# Patient Record
Sex: Male | Born: 1940 | Race: White | Hispanic: No | Marital: Married | Smoking: Never smoker
Health system: Southern US, Community
[De-identification: ages and names within clinical notes are randomized; demographics above are authoritative.]

## PROBLEM LIST (undated history)

## (undated) DIAGNOSIS — K2970 Gastritis, unspecified, without bleeding: Secondary | ICD-10-CM

## (undated) DIAGNOSIS — Z8719 Personal history of other diseases of the digestive system: Secondary | ICD-10-CM

## (undated) DIAGNOSIS — K219 Gastro-esophageal reflux disease without esophagitis: Secondary | ICD-10-CM

## (undated) DIAGNOSIS — C801 Malignant (primary) neoplasm, unspecified: Secondary | ICD-10-CM

## (undated) DIAGNOSIS — I10 Essential (primary) hypertension: Secondary | ICD-10-CM

## (undated) DIAGNOSIS — E785 Hyperlipidemia, unspecified: Secondary | ICD-10-CM

## (undated) DIAGNOSIS — Z972 Presence of dental prosthetic device (complete) (partial): Secondary | ICD-10-CM

## (undated) DIAGNOSIS — M67919 Unspecified disorder of synovium and tendon, unspecified shoulder: Secondary | ICD-10-CM

## (undated) DIAGNOSIS — J302 Other seasonal allergic rhinitis: Secondary | ICD-10-CM

## (undated) DIAGNOSIS — E039 Hypothyroidism, unspecified: Secondary | ICD-10-CM

## (undated) DIAGNOSIS — M719 Bursopathy, unspecified: Secondary | ICD-10-CM

## (undated) DIAGNOSIS — F419 Anxiety disorder, unspecified: Secondary | ICD-10-CM

## (undated) DIAGNOSIS — H8109 Meniere's disease, unspecified ear: Secondary | ICD-10-CM

## (undated) DIAGNOSIS — M542 Cervicalgia: Secondary | ICD-10-CM

## (undated) DIAGNOSIS — C4490 Unspecified malignant neoplasm of skin, unspecified: Secondary | ICD-10-CM

## (undated) DIAGNOSIS — R972 Elevated prostate specific antigen [PSA]: Secondary | ICD-10-CM

## (undated) DIAGNOSIS — K824 Cholesterolosis of gallbladder: Secondary | ICD-10-CM

## (undated) DIAGNOSIS — R0789 Other chest pain: Secondary | ICD-10-CM

## (undated) HISTORY — PX: CHOLECYSTECTOMY: SHX55

## (undated) HISTORY — PX: APPENDECTOMY: SHX54

## (undated) HISTORY — PX: HYDROCELE EXCISION / REPAIR: SUR1145

## (undated) HISTORY — PX: COLONOSCOPY WITH ESOPHAGOGASTRODUODENOSCOPY (EGD): SHX5779

## (undated) HISTORY — PX: OTHER SURGICAL HISTORY: SHX169

## (undated) HISTORY — PX: COLONOSCOPY: SHX174

---

## 1999-07-23 HISTORY — PX: ROTATOR CUFF REPAIR: SHX139

## 2006-01-03 ENCOUNTER — Ambulatory Visit: Payer: Self-pay | Admitting: Internal Medicine

## 2006-03-08 ENCOUNTER — Ambulatory Visit: Payer: Self-pay | Admitting: Internal Medicine

## 2006-09-09 ENCOUNTER — Ambulatory Visit: Payer: Self-pay | Admitting: Unknown Physician Specialty

## 2006-09-19 ENCOUNTER — Ambulatory Visit: Payer: Self-pay | Admitting: Otolaryngology

## 2011-08-20 ENCOUNTER — Ambulatory Visit: Payer: Self-pay | Admitting: Unknown Physician Specialty

## 2011-08-21 LAB — PATHOLOGY REPORT

## 2013-09-24 ENCOUNTER — Ambulatory Visit: Payer: Self-pay | Admitting: Internal Medicine

## 2015-06-30 ENCOUNTER — Other Ambulatory Visit: Payer: Self-pay | Admitting: Internal Medicine

## 2015-06-30 DIAGNOSIS — R102 Pelvic and perineal pain: Secondary | ICD-10-CM

## 2015-06-30 DIAGNOSIS — R634 Abnormal weight loss: Secondary | ICD-10-CM

## 2015-06-30 DIAGNOSIS — R1084 Generalized abdominal pain: Secondary | ICD-10-CM

## 2015-07-12 ENCOUNTER — Ambulatory Visit
Admission: RE | Admit: 2015-07-12 | Discharge: 2015-07-12 | Disposition: A | Discharge status: Home / Self Care | Payer: Medicare Other | Source: Ambulatory Visit | Attending: Internal Medicine | Admitting: Internal Medicine

## 2015-07-12 DIAGNOSIS — N400 Enlarged prostate without lower urinary tract symptoms: Secondary | ICD-10-CM | POA: Insufficient documentation

## 2015-07-12 DIAGNOSIS — Z9049 Acquired absence of other specified parts of digestive tract: Secondary | ICD-10-CM | POA: Diagnosis not present

## 2015-07-12 DIAGNOSIS — R634 Abnormal weight loss: Secondary | ICD-10-CM | POA: Diagnosis present

## 2015-07-12 DIAGNOSIS — K5730 Diverticulosis of large intestine without perforation or abscess without bleeding: Secondary | ICD-10-CM | POA: Insufficient documentation

## 2015-07-12 DIAGNOSIS — R102 Pelvic and perineal pain: Secondary | ICD-10-CM

## 2015-07-12 DIAGNOSIS — R1084 Generalized abdominal pain: Secondary | ICD-10-CM | POA: Diagnosis not present

## 2015-07-12 MED ORDER — IOHEXOL 300 MG/ML  SOLN
100.0000 mL | Freq: Once | INTRAMUSCULAR | Status: AC | PRN
  Administered 2015-07-12: 100 mL via INTRAVENOUS

## 2016-01-11 NOTE — Discharge Instructions (Signed)

## 2016-01-15 ENCOUNTER — Ambulatory Visit: Payer: Medicare Other | Admitting: Anesthesiology

## 2016-01-15 ENCOUNTER — Ambulatory Visit
Admission: RE | Admit: 2016-01-15 | Discharge: 2016-01-15 | Disposition: A | Discharge status: Home / Self Care | Payer: Medicare Other | Source: Ambulatory Visit | Attending: Ophthalmology | Admitting: Ophthalmology

## 2016-01-15 ENCOUNTER — Encounter: Payer: Self-pay | Admitting: *Deleted

## 2016-01-15 ENCOUNTER — Encounter
Admission: RE | Disposition: A | Discharge status: Home / Self Care | Payer: Self-pay | Source: Ambulatory Visit | Attending: Ophthalmology

## 2016-01-15 DIAGNOSIS — H9190 Unspecified hearing loss, unspecified ear: Secondary | ICD-10-CM | POA: Insufficient documentation

## 2016-01-15 DIAGNOSIS — Z79899 Other long term (current) drug therapy: Secondary | ICD-10-CM | POA: Insufficient documentation

## 2016-01-15 DIAGNOSIS — Z791 Long term (current) use of non-steroidal anti-inflammatories (NSAID): Secondary | ICD-10-CM | POA: Diagnosis not present

## 2016-01-15 DIAGNOSIS — K219 Gastro-esophageal reflux disease without esophagitis: Secondary | ICD-10-CM | POA: Insufficient documentation

## 2016-01-15 DIAGNOSIS — H8109 Meniere's disease, unspecified ear: Secondary | ICD-10-CM | POA: Insufficient documentation

## 2016-01-15 DIAGNOSIS — H2511 Age-related nuclear cataract, right eye: Secondary | ICD-10-CM | POA: Insufficient documentation

## 2016-01-15 DIAGNOSIS — E039 Hypothyroidism, unspecified: Secondary | ICD-10-CM | POA: Diagnosis not present

## 2016-01-15 HISTORY — PX: CATARACT EXTRACTION W/PHACO: SHX586

## 2016-01-15 HISTORY — DX: Presence of dental prosthetic device (complete) (partial): Z97.2

## 2016-01-15 HISTORY — DX: Other seasonal allergic rhinitis: J30.2

## 2016-01-15 HISTORY — DX: Hypothyroidism, unspecified: E03.9

## 2016-01-15 HISTORY — DX: Meniere's disease, unspecified ear: H81.09

## 2016-01-15 SURGERY — PHACOEMULSIFICATION, CATARACT, WITH IOL INSERTION
Anesthesia: Monitor Anesthesia Care | Laterality: Right | Wound class: Clean

## 2016-01-15 MED ORDER — POVIDONE-IODINE 5 % OP SOLN
1.0000 "application " | OPHTHALMIC | Status: DC | PRN
  Administered 2016-01-15: 1 via OPHTHALMIC

## 2016-01-15 MED ORDER — FENTANYL CITRATE (PF) 100 MCG/2ML IJ SOLN
INTRAMUSCULAR | Status: DC | PRN
  Administered 2016-01-15 (×2): 50 ug via INTRAVENOUS

## 2016-01-15 MED ORDER — TIMOLOL MALEATE 0.5 % OP SOLN
OPHTHALMIC | Status: DC | PRN
  Administered 2016-01-15: 1 [drp] via OPHTHALMIC

## 2016-01-15 MED ORDER — TETRACAINE HCL 0.5 % OP SOLN
1.0000 [drp] | OPHTHALMIC | Status: DC | PRN
  Administered 2016-01-15: 1 [drp] via OPHTHALMIC

## 2016-01-15 MED ORDER — EPINEPHRINE HCL 1 MG/ML IJ SOLN
INTRAOCULAR | Status: DC | PRN
  Administered 2016-01-15: 1 mL via OPHTHALMIC

## 2016-01-15 MED ORDER — NA HYALUR & NA CHOND-NA HYALUR 0.4-0.35 ML IO KIT
PACK | INTRAOCULAR | Status: DC | PRN
  Administered 2016-01-15: 1 mL via INTRAOCULAR

## 2016-01-15 MED ORDER — ARMC OPHTHALMIC DILATING GEL
1.0000 "application " | OPHTHALMIC | Status: DC | PRN
  Administered 2016-01-15 (×2): 1 via OPHTHALMIC

## 2016-01-15 MED ORDER — BRIMONIDINE TARTRATE 0.2 % OP SOLN
OPHTHALMIC | Status: DC | PRN
  Administered 2016-01-15: 1 [drp] via OPHTHALMIC

## 2016-01-15 MED ORDER — LACTATED RINGERS IV SOLN: INTRAVENOUS | Status: DC

## 2016-01-15 MED ORDER — OXYCODONE HCL 5 MG/5ML PO SOLN: 5.0000 mg | Freq: Once | ORAL | Status: DC | PRN

## 2016-01-15 MED ORDER — CEFUROXIME OPHTHALMIC INJECTION 1 MG/0.1 ML
INJECTION | OPHTHALMIC | Status: DC | PRN
  Administered 2016-01-15: 0.1 mL via INTRACAMERAL

## 2016-01-15 MED ORDER — OXYCODONE HCL 5 MG PO TABS: 5.0000 mg | ORAL_TABLET | Freq: Once | ORAL | Status: DC | PRN

## 2016-01-15 MED ORDER — LIDOCAINE HCL (PF) 4 % IJ SOLN
INTRAOCULAR | Status: DC | PRN
  Administered 2016-01-15: 1 mL via OPHTHALMIC

## 2016-01-15 MED ORDER — MIDAZOLAM HCL 2 MG/2ML IJ SOLN
INTRAMUSCULAR | Status: DC | PRN
  Administered 2016-01-15: 2 mg via INTRAVENOUS

## 2016-01-15 SURGICAL SUPPLY — 28 items

## 2016-01-15 NOTE — Op Note (Signed)
Date of Surgery: 01/15/2016  PREOPERATIVE DIAGNOSES: Visually significant nuclear sclerotic cataract, right eye.  POSTOPERATIVE DIAGNOSES: Same  PROCEDURES PERFORMED: Cataract extraction with intraocular lens implant, right eye.  SURGEON: Almon Hercules, M.D.  ANESTHESIA: MAC and topical  IMPLANTS: [id number] +19.5 D   Implant Name Type Inv. Item Serial No. Manufacturer Lot No. LRB No. Used  LENS IOL ACRYSOF IQ 19.5 - [id number] Intraocular Lens LENS IOL ACRYSOF IQ 19.5 [id number] ALCON   Right 1     COMPLICATIONS: None.  DESCRIPTION OF PROCEDURE: Therapeutic options were discussed with the patient preoperatively, including a discussion of risks and benefits of surgery. Informed consent was obtained. An IOL-Master and immersion biometry were used to take the lens measurements, and a dilated fundus exam was performed within 6 months of the surgical date.  The patient was premedicated and brought to the operating room and placed on the operating table in the supine position. After adequate anesthesia, the patient was prepped and draped in the usual sterile ophthalmic fashion. A wire lid speculum was inserted and the microscope was positioned. A Superblade was used to create a paracentesis site at the limbus and a small amount of dilute preservative free lidocaine was instilled into the anterior chamber, followed by dispersive viscoelastic. A clear corneal incision was created temporally using a 2.4 mm keratome blade. Capsulorrhexis was then performed. In situ phacoemulsification was performed.  Cortical material was removed with the irrigation-aspiration unit. Dispersive viscoelastic was instilled to open the capsular bag. A posterior chamber intraocular lens with the specifications above was inserted and positioned. Irrigation-aspiration was used to remove all viscoelastic. Cefuroxime 1cc was instilled into the anterior chamber, and the corneal incision was checked and found to be water tight.  The eyelid speculum was removed.  The operative eye was covered with protective goggles after instilling 1 drop of timolol and brimonidine. The patient tolerated the procedure well. There were no complications.

## 2016-01-15 NOTE — Transfer of Care (Signed)
Immediate Anesthesia Transfer of Care Note  Patient: Melvin Gilmore  Procedure(s) Performed: Procedure(s) with comments: CATARACT EXTRACTION PHACO AND INTRAOCULAR LENS PLACEMENT ([place]) (Right) - RIGHT CALL CELL PHONE WITH TIME  Patient Location: PACU  Anesthesia Type: MAC  Level of Consciousness: awake, alert  and patient cooperative  Airway and Oxygen Therapy: Patient Spontanous Breathing and Patient connected to supplemental oxygen  Post-op Assessment: Post-op Vital signs reviewed, Patient's Cardiovascular Status Stable, Respiratory Function Stable, Patent Airway and No signs of Nausea or vomiting  Post-op Vital Signs: Reviewed and stable  Complications: No apparent anesthesia complications

## 2016-01-15 NOTE — H&P (Signed)
H+P reviewed and is up to date, please see paper chart.  

## 2016-01-15 NOTE — Anesthesia Postprocedure Evaluation (Signed)
Anesthesia Post Note  Patient: Melvin Gilmore  Procedure(s) Performed: Procedure(s) (LRB): CATARACT EXTRACTION PHACO AND INTRAOCULAR LENS PLACEMENT (IOC) (Right)  Patient location during evaluation: PACU Anesthesia Type: MAC Level of consciousness: awake and alert Pain management: pain level controlled Vital Signs Assessment: post-procedure vital signs reviewed and stable Respiratory status: spontaneous breathing, nonlabored ventilation, respiratory function stable and patient connected to nasal cannula oxygen Cardiovascular status: stable and blood pressure returned to baseline Anesthetic complications: no    Jawaan Adachi

## 2016-01-15 NOTE — Anesthesia Preprocedure Evaluation (Signed)
Anesthesia Evaluation  Patient identified by MRN, date of birth, ID band  Reviewed: NPO status   History of Anesthesia Complications Negative for: history of anesthetic complications  Airway Mallampati: II  TM Distance: >3 FB Neck ROM: full    Dental  (+) Lower Dentures   Pulmonary neg pulmonary ROS,    Pulmonary exam normal        Cardiovascular Exercise Tolerance: Good negative cardio ROS Normal cardiovascular exam     Neuro/Psych Meniere disease negative psych ROS   GI/Hepatic Neg liver ROS, GERD  Controlled,  Endo/Other  Hypothyroidism   Renal/GU negative Renal ROS  negative genitourinary   Musculoskeletal   Abdominal   Peds  Hematology negative hematology ROS (+)   Anesthesia Other Findings   Reproductive/Obstetrics                             Anesthesia Physical Anesthesia Plan  ASA: II  Anesthesia Plan: MAC   Post-op Pain Management:    Induction:   Airway Management Planned:   Additional Equipment:   Intra-op Plan:   Post-operative Plan:   Informed Consent: I have reviewed the patients History and Physical, chart, labs and discussed the procedure including the risks, benefits and alternatives for the proposed anesthesia with the patient or authorized representative who has indicated his/her understanding and acceptance.     Plan Discussed with: CRNA  Anesthesia Plan Comments:         Anesthesia Quick Evaluation

## 2016-01-16 ENCOUNTER — Encounter: Payer: Self-pay | Admitting: Ophthalmology

## 2016-12-30 ENCOUNTER — Other Ambulatory Visit: Payer: Self-pay | Admitting: Internal Medicine

## 2016-12-30 DIAGNOSIS — R053 Chronic cough: Secondary | ICD-10-CM

## 2016-12-30 DIAGNOSIS — R05 Cough: Secondary | ICD-10-CM

## 2017-01-13 ENCOUNTER — Ambulatory Visit
Admission: RE | Admit: 2017-01-13 | Discharge: 2017-01-13 | Disposition: A | Discharge status: Home / Self Care | Payer: Medicare Other | Source: Ambulatory Visit | Attending: Internal Medicine | Admitting: Internal Medicine

## 2017-01-13 DIAGNOSIS — R05 Cough: Secondary | ICD-10-CM | POA: Diagnosis present

## 2017-01-13 DIAGNOSIS — R918 Other nonspecific abnormal finding of lung field: Secondary | ICD-10-CM | POA: Diagnosis not present

## 2017-01-13 DIAGNOSIS — I700 Atherosclerosis of aorta: Secondary | ICD-10-CM | POA: Insufficient documentation

## 2017-01-13 DIAGNOSIS — K7689 Other specified diseases of liver: Secondary | ICD-10-CM | POA: Insufficient documentation

## 2017-01-13 DIAGNOSIS — N281 Cyst of kidney, acquired: Secondary | ICD-10-CM | POA: Insufficient documentation

## 2017-01-13 DIAGNOSIS — R053 Chronic cough: Secondary | ICD-10-CM

## 2017-08-19 ENCOUNTER — Other Ambulatory Visit: Payer: Self-pay | Admitting: Internal Medicine

## 2017-08-19 DIAGNOSIS — K224 Dyskinesia of esophagus: Secondary | ICD-10-CM

## 2017-08-28 ENCOUNTER — Ambulatory Visit
Admission: RE | Admit: 2017-08-28 | Discharge: 2017-08-28 | Disposition: A | Discharge status: Home / Self Care | Payer: Medicare Other | Source: Ambulatory Visit | Attending: Internal Medicine | Admitting: Internal Medicine

## 2017-08-28 DIAGNOSIS — R1310 Dysphagia, unspecified: Secondary | ICD-10-CM | POA: Diagnosis present

## 2017-08-28 DIAGNOSIS — K224 Dyskinesia of esophagus: Secondary | ICD-10-CM

## 2017-08-28 DIAGNOSIS — K219 Gastro-esophageal reflux disease without esophagitis: Secondary | ICD-10-CM | POA: Diagnosis not present

## 2017-08-28 DIAGNOSIS — K449 Diaphragmatic hernia without obstruction or gangrene: Secondary | ICD-10-CM | POA: Insufficient documentation

## 2017-09-22 ENCOUNTER — Encounter: Payer: Self-pay | Admitting: *Deleted

## 2017-09-23 ENCOUNTER — Encounter: Payer: Self-pay | Admitting: *Deleted

## 2017-09-23 ENCOUNTER — Ambulatory Visit: Payer: Medicare Other | Admitting: Anesthesiology

## 2017-09-23 ENCOUNTER — Encounter
Admission: RE | Disposition: A | Discharge status: Home / Self Care | Payer: Self-pay | Source: Ambulatory Visit | Attending: Internal Medicine

## 2017-09-23 ENCOUNTER — Ambulatory Visit
Admission: RE | Admit: 2017-09-23 | Discharge: 2017-09-23 | Disposition: A | Discharge status: Home / Self Care | Payer: Medicare Other | Source: Ambulatory Visit | Attending: Internal Medicine | Admitting: Internal Medicine

## 2017-09-23 DIAGNOSIS — Z79890 Hormone replacement therapy (postmenopausal): Secondary | ICD-10-CM | POA: Insufficient documentation

## 2017-09-23 DIAGNOSIS — J302 Other seasonal allergic rhinitis: Secondary | ICD-10-CM | POA: Insufficient documentation

## 2017-09-23 DIAGNOSIS — K449 Diaphragmatic hernia without obstruction or gangrene: Secondary | ICD-10-CM | POA: Insufficient documentation

## 2017-09-23 DIAGNOSIS — E039 Hypothyroidism, unspecified: Secondary | ICD-10-CM | POA: Diagnosis not present

## 2017-09-23 DIAGNOSIS — R933 Abnormal findings on diagnostic imaging of other parts of digestive tract: Secondary | ICD-10-CM | POA: Insufficient documentation

## 2017-09-23 DIAGNOSIS — K210 Gastro-esophageal reflux disease with esophagitis: Secondary | ICD-10-CM | POA: Diagnosis not present

## 2017-09-23 DIAGNOSIS — Z7951 Long term (current) use of inhaled steroids: Secondary | ICD-10-CM | POA: Insufficient documentation

## 2017-09-23 DIAGNOSIS — Z79899 Other long term (current) drug therapy: Secondary | ICD-10-CM | POA: Diagnosis not present

## 2017-09-23 DIAGNOSIS — R1310 Dysphagia, unspecified: Secondary | ICD-10-CM | POA: Diagnosis not present

## 2017-09-23 DIAGNOSIS — F419 Anxiety disorder, unspecified: Secondary | ICD-10-CM | POA: Diagnosis not present

## 2017-09-23 DIAGNOSIS — K222 Esophageal obstruction: Secondary | ICD-10-CM | POA: Insufficient documentation

## 2017-09-23 HISTORY — DX: Gastritis, unspecified, without bleeding: K29.70

## 2017-09-23 HISTORY — DX: Unspecified disorder of synovium and tendon, unspecified shoulder: M67.919

## 2017-09-23 HISTORY — DX: Bursopathy, unspecified: M71.9

## 2017-09-23 HISTORY — DX: Meniere's disease, unspecified ear: H81.09

## 2017-09-23 HISTORY — DX: Hyperlipidemia, unspecified: E78.5

## 2017-09-23 HISTORY — DX: Gastro-esophageal reflux disease without esophagitis: K21.9

## 2017-09-23 HISTORY — DX: Anxiety disorder, unspecified: F41.9

## 2017-09-23 HISTORY — PX: ESOPHAGOGASTRODUODENOSCOPY (EGD) WITH PROPOFOL: SHX5813

## 2017-09-23 SURGERY — ESOPHAGOGASTRODUODENOSCOPY (EGD) WITH PROPOFOL
Anesthesia: General

## 2017-09-23 MED ORDER — SODIUM CHLORIDE 0.9 % IV SOLN
INTRAVENOUS | Status: DC
  Administered 2017-09-23: 1000 mL via INTRAVENOUS

## 2017-09-23 MED ORDER — LIDOCAINE HCL (CARDIAC) 20 MG/ML IV SOLN
INTRAVENOUS | Status: DC | PRN
  Administered 2017-09-23: 40 mg via INTRAVENOUS

## 2017-09-23 MED ORDER — PROPOFOL 10 MG/ML IV BOLUS
INTRAVENOUS | Status: DC | PRN
  Administered 2017-09-23: 60 mg via INTRAVENOUS
  Administered 2017-09-23: 20 mg via INTRAVENOUS

## 2017-09-23 MED ORDER — PROPOFOL 500 MG/50ML IV EMUL
INTRAVENOUS | Status: DC | PRN
  Administered 2017-09-23: 140 ug/kg/min via INTRAVENOUS

## 2017-09-23 NOTE — Anesthesia Preprocedure Evaluation (Signed)
Anesthesia Evaluation  Patient identified by MRN, date of birth, ID band Patient awake    Reviewed: Allergy & Precautions, NPO status , Patient's Chart, lab work & pertinent test results  History of Anesthesia Complications Negative for: history of anesthetic complications  Airway Mallampati: II  TM Distance: >3 FB Neck ROM: full    Dental  (+) Lower Dentures   Pulmonary neg pulmonary ROS,    Pulmonary exam normal        Cardiovascular Exercise Tolerance: Good negative cardio ROS Normal cardiovascular exam     Neuro/Psych Meniere disease  Neuromuscular disease negative psych ROS   GI/Hepatic Neg liver ROS, GERD  Controlled,  Endo/Other  Hypothyroidism   Renal/GU negative Renal ROS  negative genitourinary   Musculoskeletal   Abdominal   Peds  Hematology negative hematology ROS (+)   Anesthesia Other Findings Past Medical History: No date: Anxiety No date: 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 No date: Elevated lipids No date: Gastritis No date: GERD (gastroesophageal reflux disease) No date: Hypothyroidism No date: Meniere disease     Comment:  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 No date: Meniere's disease No date: Seasonal allergies No date: Wears partial dentures     Comment:  lower  Reproductive/Obstetrics                             Anesthesia Physical  Anesthesia Plan  ASA: II  Anesthesia Plan: General   Post-op Pain Management:    Induction:   PONV Risk Score and Plan:   Airway Management Planned: Nasal Cannula  Additional Equipment:   Intra-op Plan:   Post-operative Plan:   Informed Consent: I have reviewed the patients History and Physical, chart, labs and discussed the procedure including the risks, benefits and alternatives for the proposed anesthesia with the patient or authorized representative who has indicated his/her  understanding and acceptance.     Plan Discussed with: CRNA  Anesthesia Plan Comments:         Anesthesia Quick Evaluation

## 2017-09-23 NOTE — Anesthesia Post-op Follow-up Note (Signed)
Anesthesia QCDR form completed.        

## 2017-09-23 NOTE — H&P (Signed)
Outpatient short stay form Pre-procedure 09/23/2017 8:37 AM Melvin Gilmore Melvin Gilmore, M.D.  Primary Physician: Melvin Gilmore, M.D.  Reason for visit:  Dysphagia, Abnormal barium swallow  History of present illness: Patient is a 77 year old male with a history of recurrent dysphagia status post barium swallow on 08/28/2017 revealing upper esophageal and distal esophageal narrowing with the barium tablet passing through both narrowed areas without difficulty.  Lesion in the bottom of the esophagus called a "possible stricture" small hiatal hernia also noted.    Current Facility-Administered Medications:  .  0.9 %  sodium chloride infusion, , Intravenous, Continuous, [place], [name], MD, Last Rate: 20 mL/hr at 09/23/17 0758, 1,000 mL at 09/23/17 0758  Medications Prior to Admission  Medication Sig Dispense Refill Last Dose  . ALPRAZolam (XANAX) 0.5 MG tablet Take 0.5 mg by mouth at bedtime as needed for anxiety.     Marland Kitchen omeprazole (PRILOSEC) 40 MG capsule Take 40 mg by mouth daily.     . tadalafil (CIALIS) 5 MG tablet Take 5 mg by mouth daily as needed for erectile dysfunction.     . [DISCONTINUED] fluticasone (FLONASE) 50 MCG/ACT nasal spray Place 1 spray into both nostrils 2 (two) times daily.     . [DISCONTINUED] ranitidine (ZANTAC) 300 MG tablet Take 300 mg by mouth at bedtime.     . [DISCONTINUED] tamsulosin (FLOMAX) 0.4 MG CAPS capsule Take 0.4 mg by mouth.     . cetirizine (ZYRTEC) 10 MG tablet Take 10 mg by mouth as needed for allergies.   01/14/2016 at Unknown time  . ibuprofen (ADVIL) 200 MG tablet Take 200 mg by mouth every 6 (six) hours as needed.   Past Week at Unknown time  . levothyroxine (SYNTHROID, LEVOTHROID) 50 MCG tablet Take 50 mcg by mouth daily before breakfast.   01/15/2016 at 0500  . Plant Sterols and Stanols (CHOLESTOFF) 450 MG TABS Take 1 tablet by mouth.    Past Week at Unknown time  . Probiotic Product (PROBIOTIC ADVANCED PO) Take by mouth.   Past Week at Unknown time  .  [DISCONTINUED] esomeprazole (NEXIUM) 20 MG capsule Take 20 mg by mouth as needed.   01/13/2016     Allergies  Allergen Reactions  . Augmentin [Amoxicillin-Pot Clavulanate]   . Mobic [Meloxicam]      Past Medical History:  Diagnosis Date  . Anxiety   . 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 Elevated lipids   . Gastritis   . GERD (gastroesophageal reflux disease)   . Hypothyroidism   . 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 Meniere's disease   . Seasonal allergies   . Wears partial dentures    lower    Review of systems:   Negative.   Physical Exam  Gen: Alert, oriented. Appears stated age.  HEENT: [place]/AT. PERRLA. Lungs: CTA, no wheezes. CV: RR nl S1, S2. Abd: soft, benign, no masses. BS+ Ext: No edema. Pulses 2+    Planned procedures: EGD. The patient understands the nature of the planned procedure, indications, risks, alternatives and potential complications including but not limited to bleeding, infection, perforation, damage to internal organs and possible oversedation/side effects from anesthesia. The patient agrees and gives consent to proceed.  Please refer to procedure notes for findings, recommendations and patient disposition/instructions.    [name] Melvin Gilmore, M.D. Gastroenterology 09/23/2017  8:37 AM

## 2017-09-23 NOTE — Anesthesia Postprocedure Evaluation (Signed)
Anesthesia Post Note  Patient: Melvin Gilmore  Procedure(s) Performed: ESOPHAGOGASTRODUODENOSCOPY (EGD) WITH PROPOFOL (N/A )  Patient location during evaluation: PACU Anesthesia Type: General Level of consciousness: awake and alert and oriented Pain management: pain level controlled Vital Signs Assessment: post-procedure vital signs reviewed and stable Respiratory status: spontaneous breathing Cardiovascular status: blood pressure returned to baseline Anesthetic complications: no     Last Vitals:  Vitals:   09/23/17 0930 09/23/17 0940  BP: 119/83 137/79  Pulse: 64 (!) 50  Resp: (!) 26 16  Temp:    SpO2: 100% 100%    Last Pain:  Vitals:   09/23/17 0746  TempSrc: Tympanic                 Oree Mirelez

## 2017-09-23 NOTE — Op Note (Signed)
[facility] Gastroenterology Patient Name: Melvin Gilmore Procedure Date: 09/23/2017 8:43 AM MRN: [id number] Account #: [id number] Date of Birth: 06-Feb-1941 Admit Type: Outpatient Age: 77 Room: [facility] ENDO ROOM 2 Gender: Male Note Status: Finalized Procedure:            Upper GI endoscopy Indications:          Esophageal dysphagia, Abnormal UGI series Providers:            [name]. Alice Reichert MD, MD Referring MD:         Leonie Douglas. Doy Hutching, MD (Referring MD) Medicines:            Propofol per Anesthesia Complications:        No immediate complications. Procedure:            Pre-Anesthesia Assessment:                       - The risks and benefits of the procedure and the                        sedation options and risks were discussed with the                        patient. All questions were answered and informed                        consent was obtained.                       - Patient identification and proposed procedure were                        verified prior to the procedure by the nurse. The                        procedure was verified in the procedure room.                       - ASA Grade Assessment: II - A patient with mild                        systemic disease.                       - After reviewing the risks and benefits, the patient                        was deemed in satisfactory condition to undergo the                        procedure.                       After obtaining informed consent, the endoscope was                        passed under direct vision. Throughout the procedure,                        the patient's blood pressure, pulse, and oxygen  saturations were monitored continuously. The Endoscope                        was introduced through the mouth, and advanced to the                        third part of duodenum. The upper GI endoscopy was                        accomplished without difficulty. The  patient tolerated                        the procedure well. Findings:      Mucosal changes including ringed esophagus were found in the entire       esophagus. Biopsies were obtained from the proximal and distal esophagus       with cold forceps for histology of suspected eosinophilic esophagitis.      One mild benign-appearing, intrinsic stenosis was found at the       gastroesophageal junction. This measured less than one cm (in length)       and was traversed. The scope was withdrawn. Dilation was performed with       a Maloney dilator with no resistance at 65 Fr.      A small hiatal hernia was present.      The exam was otherwise without abnormality.      The examined duodenum was normal. Impression:           - Esophageal mucosal changes suggestive of eosinophilic                        esophagitis. Biopsied.                       - Benign-appearing esophageal stenosis. Dilated.                       - Small hiatal hernia.                       - The examination was otherwise normal.                       - Normal examined duodenum. Recommendation:       - Patient has a contact number available for                        emergencies. The signs and symptoms of potential                        delayed complications were discussed with the patient.                        Return to normal activities tomorrow. Written discharge                        instructions were provided to the patient.                       - Resume previous diet.                       -  Continue present medications.                       - Await pathology results.                       - Repeat upper endoscopy PRN for retreatment.                       - Return to GI office in 3 months. Procedure Code(s):    --- Professional ---                       [phone], Esophagogastroduodenoscopy, flexible, transoral;                        with biopsy, single or multiple                       43450, Dilation of  esophagus, by unguided sound or                        bougie, single or multiple passes Diagnosis Code(s):    --- Professional ---                       R93.3, Abnormal findings on diagnostic imaging of other                        parts of digestive tract                       R13.14, Dysphagia, pharyngoesophageal phase                       K44.9, Diaphragmatic hernia without obstruction or                        gangrene                       K22.2, Esophageal obstruction                       K20.9, Esophagitis, unspecified CPT copyright 2016 American Medical Association. All rights reserved. The codes documented in this report are preliminary and upon coder review may  be revised to meet current compliance requirements. Efrain Sella MD, MD 09/23/2017 9:07:24 AM This report has been signed electronically. Number of Addenda: 0 Note Initiated On: 09/23/2017 8:43 AM      [facility]

## 2017-09-23 NOTE — Transfer of Care (Signed)
Immediate Anesthesia Transfer of Care Note  Patient: Melvin Gilmore  Procedure(s) Performed: ESOPHAGOGASTRODUODENOSCOPY (EGD) WITH PROPOFOL (N/A )  Patient Location: PACU  Anesthesia Type:General  Level of Consciousness: awake, alert  and oriented  Airway & Oxygen Therapy: Patient Spontanous Breathing  Post-op Assessment: Report given to RN and Post -op Vital signs reviewed and stable  Post vital signs: Reviewed and stable  Last Vitals:  Vitals:   09/23/17 0746 09/23/17 0907  BP: (!) 141/80 111/71  Pulse: 80 65  Resp: 18 18  Temp: (!) 36.4 C (!) 36.1 C  SpO2: 100% 99%    Last Pain:  Vitals:   09/23/17 0746  TempSrc: Tympanic         Complications: No apparent anesthesia complications

## 2017-09-24 ENCOUNTER — Encounter: Payer: Self-pay | Admitting: Internal Medicine

## 2017-10-15 LAB — SURGICAL PATHOLOGY

## 2018-06-30 IMAGING — RF DG ESOPHAGUS
7 series · 14 of 24 positions shown · non-contrast
Comparison: None.

CLINICAL DATA: Esophageal spasm, throat closed when eating foods.

EXAM:
ESOPHOGRAM / BARIUM SWALLOW / BARIUM TABLET STUDY
TECHNIQUE: Combined double contrast and single contrast examination performed
using effervescent crystals, thick barium liquid, and thin barium
liquid. The patient was observed with fluoroscopy swallowing a 13 mm
barium sulphate tablet.
FLUOROSCOPY TIME:  Fluoroscopy Time:  1 minute 18 seconds
Radiation Exposure Index (if provided by the fluoroscopic device):
8.3 mGy
Number of Acquired Spot Images: 0

[Series 1: cp_standard · 0.25mm/px · 2 of 23 frames shown (1 of 7)]
[frame 4/23]
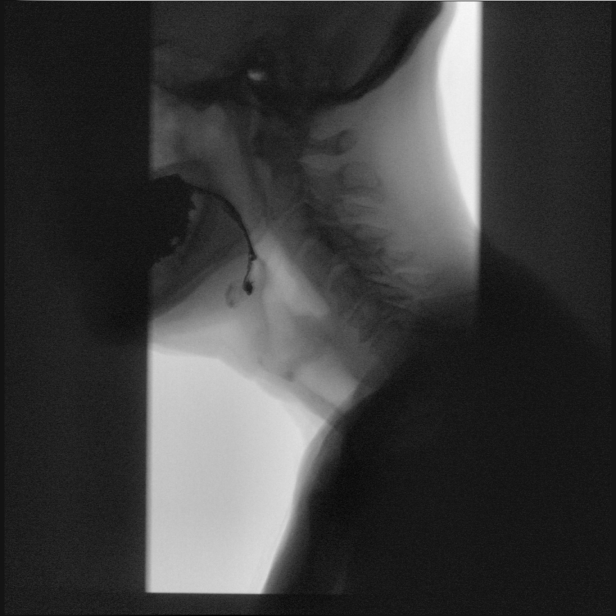
[frame 22/23  full-range]
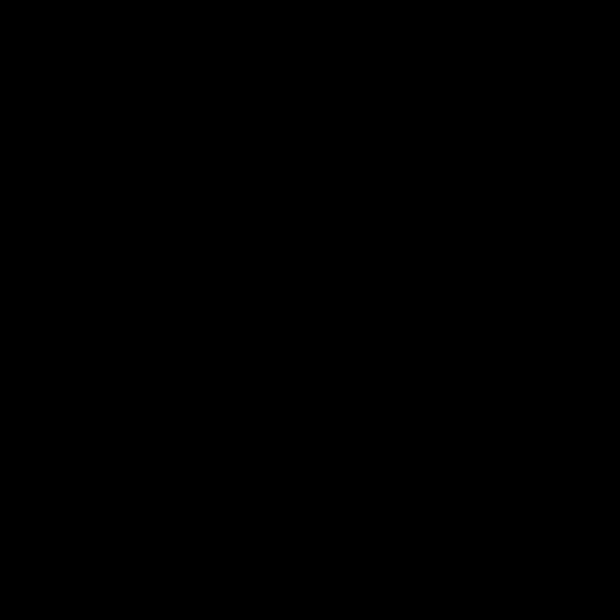

[Series 2: cp_standard · 0.25mm/px · 3 of 8 slices shown (2 of 7)]
[im 2/8]
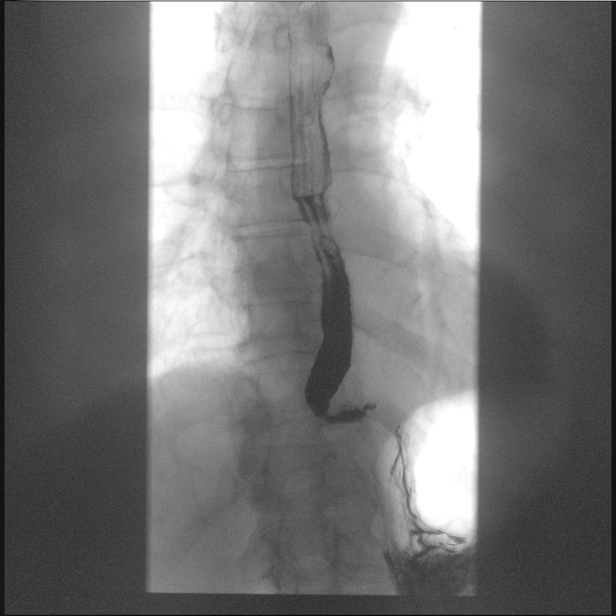
[im 5/8]
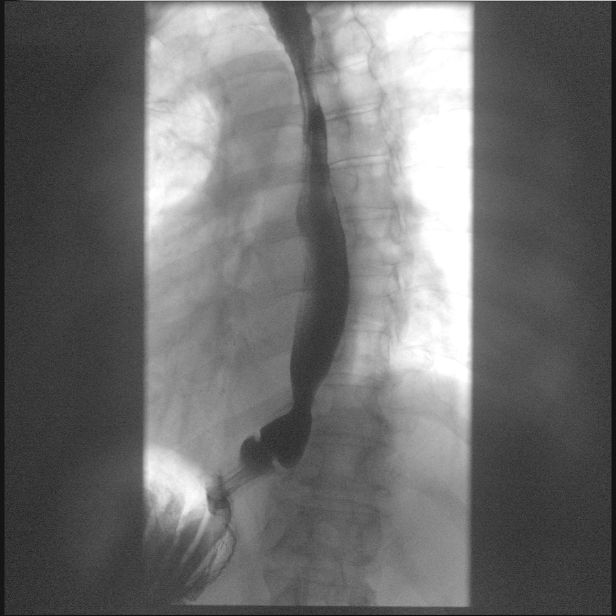
[im 6/8]
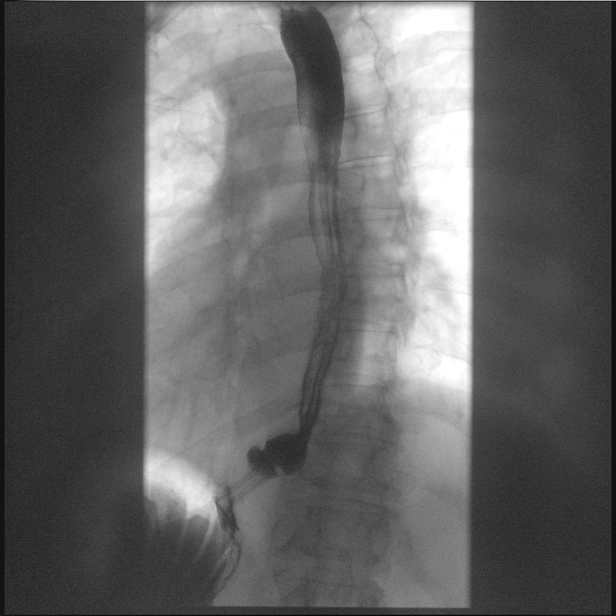

[Series 3: cp_standard · 0.25mm/px · 2 of 54 frames shown (3 of 7)]
[frame 9/54]
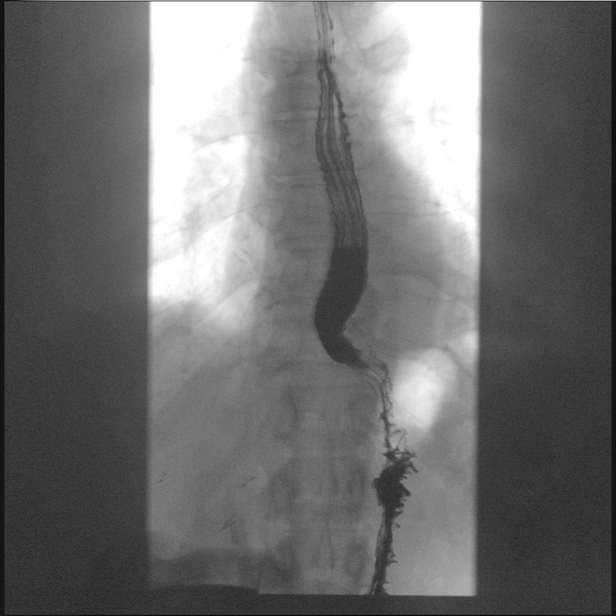
[frame 54/54  full-range]
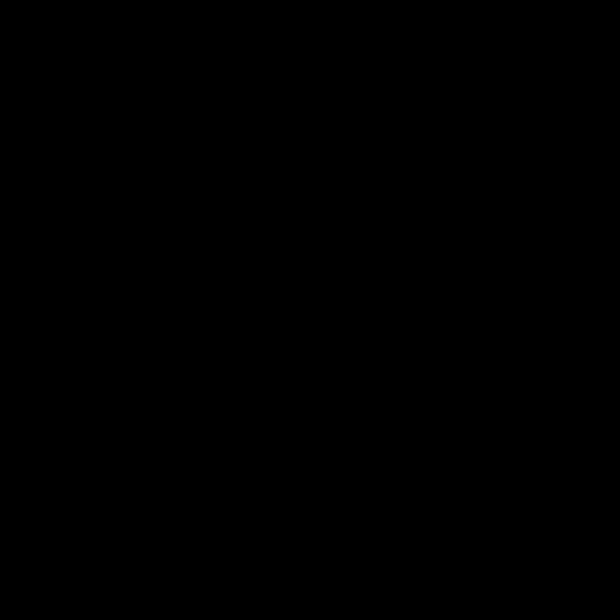

[Series 4: cp_standard · 0.25mm/px · 2 of 34 frames shown (4 of 7)]
[frame 6/34]
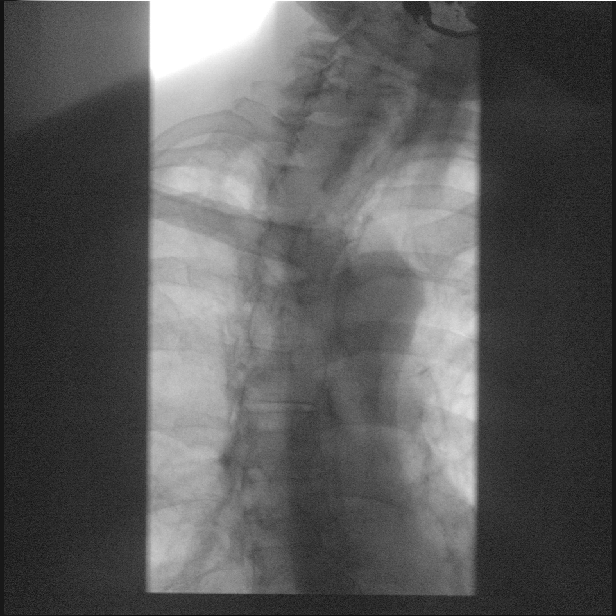
[frame 34/34  full-range]
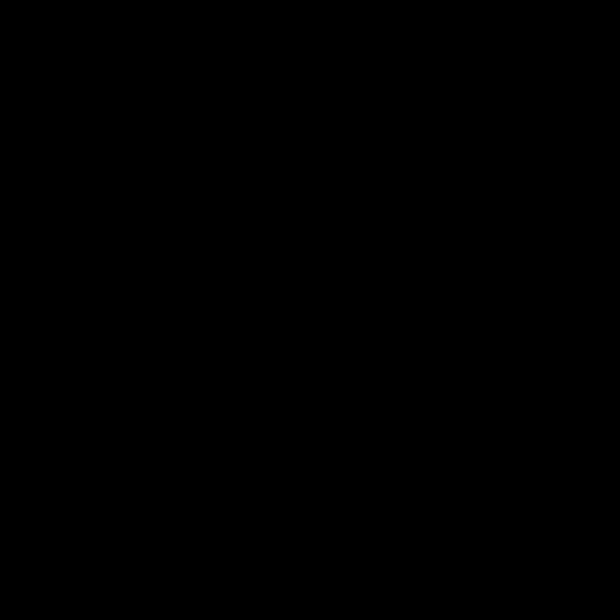

[Series 5: cp_standard · 0.25mm/px · 1 of 41 frames shown (5 of 7)]
[frame 21/41]
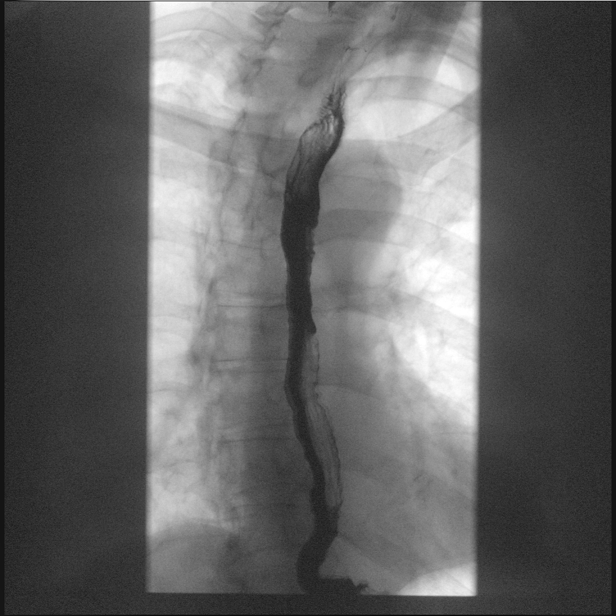

[Series 6: cp_standard · 0.25mm/px · 2 of 10 frames shown (6 of 7)]
[frame 1/10]
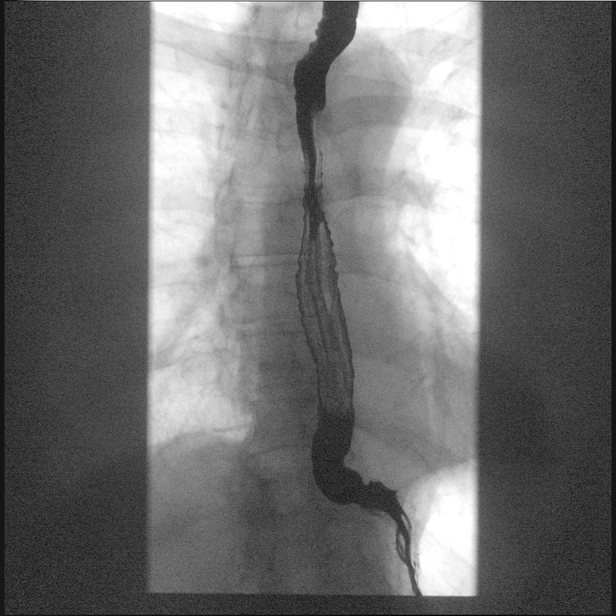
[frame 6/10]
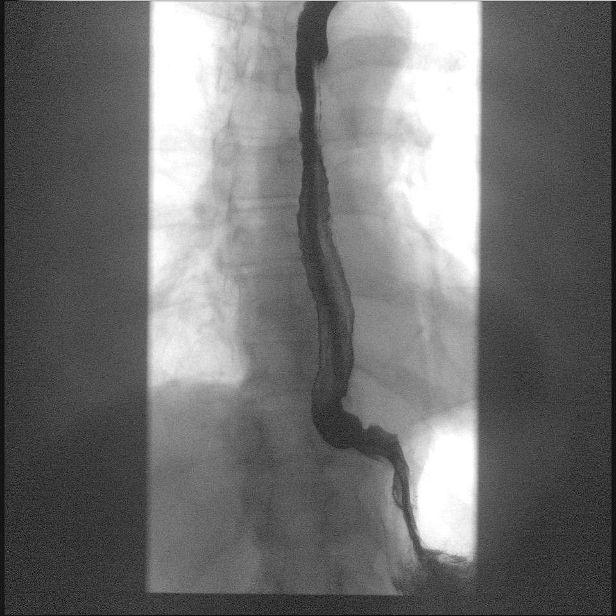

[Series 9: cp_standard · 0.25mm/px · 2 of 51 frames shown (7 of 7)]
[frame 8/51]
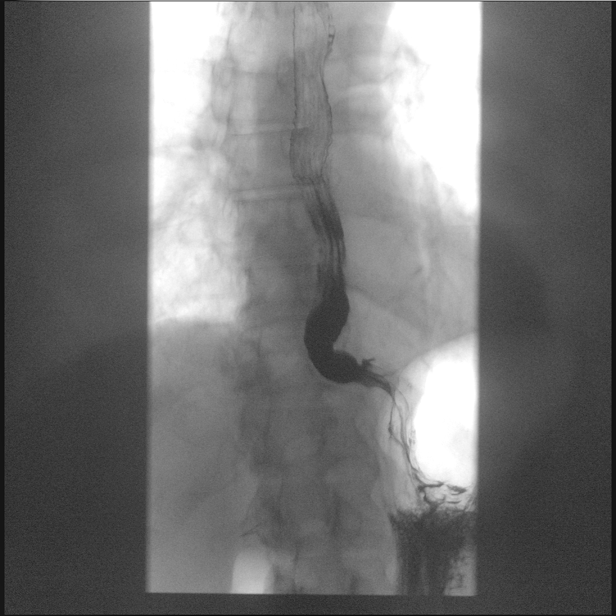
[frame 48/51]
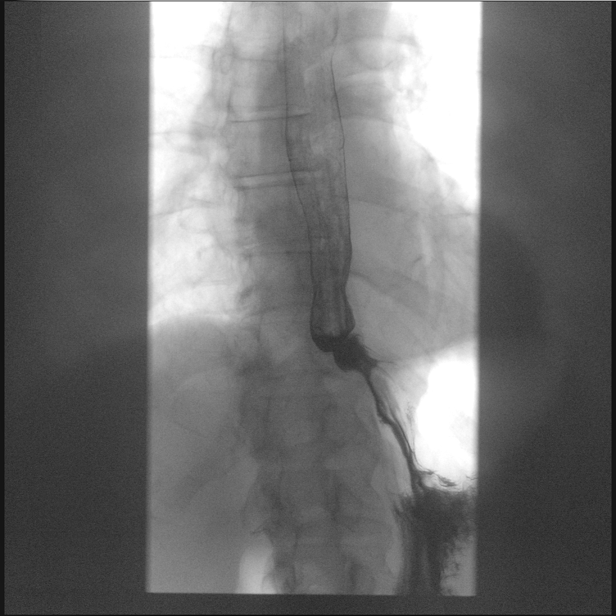

[14 of 24 positions shown; findings below may reference images not displayed]

FINDINGS: There was normal pharyngeal anatomy and motility. Contrast flowed
freely through the esophagus without evidence of mass. Mild smooth
narrowing of the upper cervical esophagus at the level of the
cricopharyngeus, but a barium tablet transits through this area
without delay. Mild focal narrowing of the distal esophagus at the
esophagogastric junction likely reflecting a mild stricture or
prominent Z line which does not restrict the passage of a barium
tablet. There is mild mucosal reticulations concerning for mild
esophagitis. There is mild diffuse esophageal spasm intermittently.
There was normal esophageal mucosa without evidence of ulceration.
There is a small hiatal hernia. There is moderate gastroesophageal
reflux.

At the end of the examination a 13 mm barium tablet was administered
which transited through the esophagus and esophagogastric junction
without delay.
IMPRESSION: 1. Mild smooth narrowing of the upper cervical esophagus at the
level of the cricopharyngeus, but a barium tablet transits through
this area without delay.
2. Mild focal narrowing of the distal esophagus at the
esophagogastric junction likely reflecting a mild stricture or
prominent Z line which does not restrict the passage of a barium
tablet.
3. Mild mucosal reticulations concerning for mild esophagitis
4. Mild diffuse esophageal spasm intermittently.
5. Small hiatal hernia.  Moderate gastroesophageal reflux.

## 2018-08-04 ENCOUNTER — Other Ambulatory Visit (HOSPITAL_COMMUNITY): Payer: Self-pay | Admitting: Internal Medicine

## 2018-08-04 ENCOUNTER — Other Ambulatory Visit: Payer: Self-pay | Admitting: Internal Medicine

## 2018-08-04 DIAGNOSIS — R0600 Dyspnea, unspecified: Secondary | ICD-10-CM

## 2018-08-17 ENCOUNTER — Ambulatory Visit
Admission: RE | Admit: 2018-08-17 | Discharge: 2018-08-17 | Disposition: A | Discharge status: Home / Self Care | Payer: Medicare Other | Source: Ambulatory Visit | Attending: Internal Medicine | Admitting: Internal Medicine

## 2018-08-17 DIAGNOSIS — R0600 Dyspnea, unspecified: Secondary | ICD-10-CM | POA: Diagnosis present

## 2020-06-13 ENCOUNTER — Other Ambulatory Visit: Payer: Self-pay | Admitting: Internal Medicine

## 2020-06-13 DIAGNOSIS — H9193 Unspecified hearing loss, bilateral: Secondary | ICD-10-CM

## 2020-06-24 ENCOUNTER — Ambulatory Visit
Admission: RE | Admit: 2020-06-24 | Discharge: 2020-06-24 | Disposition: A | Discharge status: Home / Self Care | Payer: Medicare Other | Source: Ambulatory Visit | Attending: Internal Medicine | Admitting: Internal Medicine

## 2020-06-24 ENCOUNTER — Other Ambulatory Visit: Payer: Self-pay

## 2020-06-24 DIAGNOSIS — H9193 Unspecified hearing loss, bilateral: Secondary | ICD-10-CM | POA: Insufficient documentation

## 2020-06-24 MED ORDER — GADOBUTROL 1 MMOL/ML IV SOLN
7.0000 mL | Freq: Once | INTRAVENOUS | Status: AC | PRN
  Administered 2020-06-24: 7 mL via INTRAVENOUS

## 2021-07-22 DIAGNOSIS — C61 Malignant neoplasm of prostate: Secondary | ICD-10-CM

## 2021-07-22 HISTORY — PX: ESOPHAGOGASTRODUODENOSCOPY: SHX1529

## 2021-07-22 HISTORY — DX: Malignant neoplasm of prostate: C61

## 2021-08-03 ENCOUNTER — Emergency Department: Payer: Medicare Other

## 2021-08-03 ENCOUNTER — Other Ambulatory Visit: Payer: Self-pay

## 2021-08-03 ENCOUNTER — Emergency Department
Admission: EM | Admit: 2021-08-03 | Discharge: 2021-08-04 | Disposition: A | Discharge status: Nursing Home (Includes ICF) | Payer: Medicare Other | Attending: Emergency Medicine | Admitting: Emergency Medicine

## 2021-08-03 DIAGNOSIS — I10 Essential (primary) hypertension: Secondary | ICD-10-CM | POA: Diagnosis not present

## 2021-08-03 DIAGNOSIS — Z20822 Contact with and (suspected) exposure to covid-19: Secondary | ICD-10-CM | POA: Insufficient documentation

## 2021-08-03 DIAGNOSIS — K223 Perforation of esophagus: Secondary | ICD-10-CM

## 2021-08-03 DIAGNOSIS — R55 Syncope and collapse: Secondary | ICD-10-CM | POA: Insufficient documentation

## 2021-08-03 DIAGNOSIS — R1033 Periumbilical pain: Secondary | ICD-10-CM | POA: Diagnosis present

## 2021-08-03 HISTORY — DX: Perforation of esophagus: K22.3

## 2021-08-03 LAB — CBC WITH DIFFERENTIAL/PLATELET
Abs Immature Granulocytes: 0.01 10*3/uL (ref 0.00–0.07)
Basophils Absolute: 0 10*3/uL (ref 0.0–0.1)
Basophils Relative: 0 %
Eosinophils Absolute: 0 10*3/uL (ref 0.0–0.5)
Eosinophils Relative: 0 %
HCT: 39.9 % (ref 39.0–52.0)
Hemoglobin: 13.8 g/dL (ref 13.0–17.0)
Immature Granulocytes: 0 %
Lymphocytes Relative: 21 %
Lymphs Abs: 0.9 10*3/uL (ref 0.7–4.0)
MCH: 34.8 pg — ABNORMAL HIGH (ref 26.0–34.0)
MCHC: 34.6 g/dL (ref 30.0–36.0)
MCV: 100.5 fL — ABNORMAL HIGH (ref 80.0–100.0)
Monocytes Absolute: 0.4 10*3/uL (ref 0.1–1.0)
Monocytes Relative: 8 %
Neutro Abs: 3.2 10*3/uL (ref 1.7–7.7)
Neutrophils Relative %: 71 %
Platelets: 180 10*3/uL (ref 150–400)
RBC: 3.97 MIL/uL — ABNORMAL LOW (ref 4.22–5.81)
RDW: 13.8 % (ref 11.5–15.5)
WBC: 4.5 10*3/uL (ref 4.0–10.5)
nRBC: 0.4 % — ABNORMAL HIGH (ref 0.0–0.2)

## 2021-08-03 LAB — COMPREHENSIVE METABOLIC PANEL
ALT: 28 U/L (ref 0–44)
AST: 30 U/L (ref 15–41)
Albumin: 3.4 g/dL — ABNORMAL LOW (ref 3.5–5.0)
Alkaline Phosphatase: 54 U/L (ref 38–126)
Anion gap: 8 (ref 5–15)
BUN: 28 mg/dL — ABNORMAL HIGH (ref 8–23)
CO2: 23 mmol/L (ref 22–32)
Calcium: 8.6 mg/dL — ABNORMAL LOW (ref 8.9–10.3)
Chloride: 98 mmol/L (ref 98–111)
Creatinine, Ser: 0.88 mg/dL (ref 0.61–1.24)
GFR, Estimated: >60 mL/min (ref >60)
Glucose, Bld: 112 mg/dL — ABNORMAL HIGH (ref 70–99)
Potassium: 4.2 mmol/L (ref 3.5–5.1)
Sodium: 129 mmol/L — ABNORMAL LOW (ref 135–145)
Total Bilirubin: 1.1 mg/dL (ref 0.3–1.2)
Total Protein: 7.2 g/dL (ref 6.5–8.1)

## 2021-08-03 LAB — PROCALCITONIN: Procalcitonin: <0.1 ng/mL

## 2021-08-03 LAB — URINALYSIS, COMPLETE (UACMP) WITH MICROSCOPIC
Bacteria, UA: NONE SEEN
Bilirubin Urine: NEGATIVE
Glucose, UA: NEGATIVE mg/dL
Hgb urine dipstick: NEGATIVE
Ketones, ur: NEGATIVE mg/dL
Leukocytes,Ua: NEGATIVE
Nitrite: NEGATIVE
Protein, ur: 30 mg/dL — AB
Specific Gravity, Urine: 1.029 (ref 1.005–1.030)
pH: 5 (ref 5.0–8.0)

## 2021-08-03 LAB — RESP PANEL BY RT-PCR (FLU A&B, COVID) ARPGX2
Influenza A by PCR: NEGATIVE
Influenza B by PCR: NEGATIVE
SARS Coronavirus 2 by RT PCR: NEGATIVE

## 2021-08-03 LAB — LIPASE, BLOOD: Lipase: 39 U/L (ref 11–51)

## 2021-08-03 LAB — LACTIC ACID, PLASMA: Lactic Acid, Venous: 0.8 mmol/L (ref 0.5–1.9)

## 2021-08-03 LAB — TROPONIN I (HIGH SENSITIVITY): Troponin I (High Sensitivity): 6 ng/L (ref <18)

## 2021-08-03 MED ORDER — VANCOMYCIN HCL 2000 MG/400ML IV SOLN
2000.0000 mg | Freq: Two times a day (BID) | INTRAVENOUS | Status: DC
  Administered 2021-08-04: 2000 mg via INTRAVENOUS
  Filled 2021-08-03 (×2): qty 400

## 2021-08-03 MED ORDER — IOHEXOL 350 MG/ML SOLN
100.0000 mL | Freq: Once | INTRAVENOUS | Status: AC | PRN
  Administered 2021-08-03: 100 mL via INTRAVENOUS
  Filled 2021-08-03: qty 100

## 2021-08-03 MED ORDER — SODIUM CHLORIDE 0.9 % IV BOLUS
1000.0000 mL | Freq: Once | INTRAVENOUS | Status: AC
  Administered 2021-08-03: 1000 mL via INTRAVENOUS

## 2021-08-03 MED ORDER — CEFAZOLIN SODIUM-DEXTROSE 2-4 GM/100ML-% IV SOLN
2.0000 g | Freq: Once | INTRAVENOUS | Status: AC
  Administered 2021-08-03: 2 g via INTRAVENOUS
  Filled 2021-08-03: qty 100

## 2021-08-03 MED ORDER — FLUCONAZOLE IN SODIUM CHLORIDE 400-0.9 MG/200ML-% IV SOLN
400.0000 mg | INTRAVENOUS | Status: DC
  Administered 2021-08-04: 400 mg via INTRAVENOUS
  Filled 2021-08-03: qty 200

## 2021-08-03 NOTE — ED Notes (Signed)
Pt has been weak and unable to eat since last night. Pt had abdominal pain earlier as well

## 2021-08-03 NOTE — ED Notes (Signed)
Called [facility] spoke w/sherry for possible transfer

## 2021-08-03 NOTE — ED Notes (Signed)
A 

## 2021-08-03 NOTE — ED Provider Notes (Signed)
----------------------------------------- °  11:35 PM on 08/03/2021 ----------------------------------------- I have personally seen and evaluated the patient in conjunction with physician assistant Kern Reap.  Overall patient appears well he states no pain currently no nausea but continues to belch from time to time.  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  Kennyth Lose has already spoken to cardiothoracic surgery at [facility] who is excepted the patient to their service and has a bed assigned so anticipate a fairly speedy transfer.  Patient is being covered with what appears to be an appropriate antibiotic regimen as well as antifungals.  Spoke to the patient and wife regarding the plan of care as well as gravity of the diagnoses, and they are both understandable and agreeable.   Harvest Dark, MD 08/03/21 [phone]

## 2021-08-03 NOTE — ED Notes (Signed)
Placed call to [facility] for Cardiothoracic

## 2021-08-03 NOTE — ED Triage Notes (Signed)
Pt states that he had an episode of vomiting last pm and has cont to be nauseated today, pt is pointing to his umbilicus when talking about abd discomfort. Wife reports some low grade temp today, none in triage at this time

## 2021-08-03 NOTE — ED Notes (Signed)
ED Provider at bedside. 

## 2021-08-03 NOTE — ED Notes (Signed)
Called CT to powershare images @22 :15

## 2021-08-03 NOTE — ED Provider Triage Note (Signed)
Emergency Medicine Provider Triage Evaluation Note  Melvin Gilmore , a 82 y.o. male  was evaluated in triage.  Pt complains of presents to the emergency department with nausea and vomiting for the past 2 days as well as some generalized weakness at home.  Patient states he occasionally has some pain around the periumbilical abdomen.  Denies chest tightness or shortness of breath.  Patient's had a low-grade fever at home.  No sick contacts in the home with similar symptoms  Review of Systems  Positive: Patient has nausea and vomiting.  Negative: No chest pain or chest tightness.   Physical Exam  BP 109/61 (BP Location: Left Arm)    Pulse 84    Temp 98.4 F (36.9 C) (Oral)    Resp 16    SpO2 96%  Gen:   Awake, no distress   Resp:  Normal effort  MSK:   Moves extremities without difficulty  Other:    Medical Decision Making  Medically screening exam initiated at 3:43 PM.  Appropriate orders placed.  Natasha Mead was informed that the remainder of the evaluation will be completed by another provider, this initial triage assessment does not replace that evaluation, and the importance of remaining in the ED until their evaluation is complete.     Vallarie Mare [place], [place] 08/03/21 1544

## 2021-08-03 NOTE — ED Provider Notes (Signed)
[facility] Provider Note  Patient Contact: 7:31 PM (approximate)   History   Abdominal Pain, Nausea, and Emesis   HPI  Melvin Gilmore is a 81 y.o. male with a history of hyponatremia, hypertension, dyspnea and hypercholesterolemia, presents to the emergency department with nausea and vomiting that started yesterday.  Patient had a syncopal episode at home today prior to onset of more vomiting tonight.  Patient is complaining of some periumbilical abdominal pain.  No chest pain, chest tightness or shortness of breath.  Unsure of last bowel movement.  No sick contacts with similar symptoms at home.  No fever or chills at home.      Physical Exam   Triage Vital Signs: ED Triage Vitals  Enc Vitals Group     BP 08/03/21 1542 109/61     Pulse Rate 08/03/21 1542 84     Resp 08/03/21 1542 16     Temp 08/03/21 1542 98.4 F (36.9 C)     Temp Source 08/03/21 1542 Oral     SpO2 08/03/21 1542 96 %     Weight 08/03/21 1543 145 lb (65.8 kg)     Height 08/03/21 1543 5\' 11"  (1.803 m)     Head Circumference --      Peak Flow --      Pain Score 08/03/21 1542 5     Pain Loc --      Pain Edu? --      Excl. in [place]? --     Most recent vital signs: Vitals:   08/03/21 1542 08/03/21 1925  BP: 109/61 124/69  Pulse: 84 71  Resp: 16 17  Temp: 98.4 F (36.9 C) 98.4 F (36.9 C)  SpO2: 96% 91%     General: Alert and in no acute distress. Eyes:  PERRL. EOMI. Head: No acute traumatic findings ENT:      Ears:       Nose: No congestion/rhinnorhea.      Mouth/Throat: Mucous membranes are moist. Neck: No stridor. No cervical spine tenderness to palpation. Hematological/Lymphatic/Immunilogical: No cervical lymphadenopathy.  Cardiovascular:  Good peripheral perfusion Respiratory: Normal respiratory effort without tachypnea or retractions. Lungs CTAB. Good air entry to the bases with no decreased or absent breath sounds. Gastrointestinal: Bowel sounds 4 quadrants.  Soft and nontender to palpation. No guarding or rigidity. No palpable masses. No distention. No CVA tenderness. Musculoskeletal: Full range of motion to all extremities.  Neurologic:  No gross focal neurologic deficits are appreciated.  Skin:   No rash noted Other:   ED Results / Procedures / Treatments   Labs (all labs ordered are listed, but only abnormal results are displayed) Labs Reviewed  CBC WITH DIFFERENTIAL/PLATELET - Abnormal; Notable for the following components:      Result Value   RBC 3.97 (*)    MCV 100.5 (*)    MCH 34.8 (*)    nRBC 0.4 (*)    All other components within normal limits  COMPREHENSIVE METABOLIC PANEL - Abnormal; Notable for the following components:   Sodium 129 (*)    Glucose, Bld 112 (*)    BUN 28 (*)    Calcium 8.6 (*)    Albumin 3.4 (*)    All other components within normal limits  URINALYSIS, COMPLETE (UACMP) WITH MICROSCOPIC - Abnormal; Notable for the following components:   Color, Urine YELLOW (*)    APPearance HAZY (*)    Protein, ur 30 (*)    All other components within normal limits  RESP PANEL BY RT-PCR (FLU A&B, COVID) ARPGX2  LACTIC ACID, PLASMA  PROCALCITONIN  LIPASE, BLOOD  LACTIC ACID, PLASMA  PROCALCITONIN  TROPONIN I (HIGH SENSITIVITY)  TROPONIN I (HIGH SENSITIVITY)     EKG     RADIOLOGY  I personally viewed and evaluated these images as part of my medical decision making, as well as reviewing the written report by the radiologist.  ED Provider Interpretation: Distal esophageal rupture with moderate free air surrounding hiatal hernia.  PROCEDURES:  Critical Care performed: Yes, see critical care procedure note(s)  Procedures   MEDICATIONS ORDERED IN ED: Medications  sodium chloride 0.9 % bolus 1,000 mL (has no administration in time range)     IMPRESSION / MDM / ASSESSMENT AND PLAN / ED COURSE  I reviewed the triage vital signs and the nursing notes.                              Differential  diagnosis includes, but is not limited to, nausea, vomiting, small bowel obstruction, aneurysm, esophageal rupture...  Assessment and plan Distal esophageal rupture 81 year old male presents to the emergency department with 2 days of vomiting and 1 episode of syncope.  Vital signs were reassuring at triage.  On physical exam, patient was alert, active and nontoxic-appearing.  Abdomen soft and mildly tender to palpation in the periumbilical abdomen.    CBC within reference range.  CMP indicated mild hyponatremia.    Given emesis, abdominal discomfort and recent syncope, CT angio abdomen pelvis was obtained which indicated a distal esophageal rupture with moderate free air surrounding small hiatal hernia.  CT head shows no acute abnormality.  Discussed patient case with attending Dr. Kerman Passey who recommended consulting cardiothoracic surgery.  2 g Ancef ordered.  Consulted pharmacy regarding Augmentin allergy and Ancef was recommended over Zosyn at this time.  ----------------------------------------- 10:08 PM on 08/03/2021 ----------------------------------------- Discussed results with patient and caretaker and they opted to be transferred to [facility].  ----------------------------------------- 10:09 PM on 08/03/2021 ----------------------------------------- [facility] paged for transfer  Patient accepted to [facility] by cardiothoracic surgeon Dr. Mal Misty.  Dr. Benjie Karvonen recommended normal saline, n.p.o. status and gram-positive, gram-negative and antifungal coverage.  We will add on vancomycin and IV Diflucan.  Clinical Course as of 08/03/21 2236  Fri Aug 03, 2021  2228 Urinalysis, Complete w Microscopic Urine, Clean Catch(!) [JW]    Clinical Course User Index [JW] Lannie Fields, PA-C      FINAL CLINICAL IMPRESSION(S) / ED DIAGNOSES   Final diagnoses:  None     Rx / DC Orders   ED Discharge Orders     None        Note:  This document was prepared using Dragon voice  recognition software and may include unintentional dictation errors.   Vallarie Mare [place], Hershal Coria 08/03/21 2320    Harvest Dark, MD 08/03/21 2337

## 2021-08-03 NOTE — ED Notes (Signed)
[facility] accepted patient for transfer, Pt going to [place] [address] Call [phone] option 2 for report

## 2021-08-03 NOTE — ED Notes (Signed)
Called [facility] for transport to [facility] spoke with [place] @ 23:44

## 2021-08-04 DIAGNOSIS — K223 Perforation of esophagus: Secondary | ICD-10-CM | POA: Diagnosis not present

## 2021-08-04 LAB — RESP PANEL BY RT-PCR (FLU A&B, COVID) ARPGX2
Influenza A by PCR: NEGATIVE
Influenza B by PCR: NEGATIVE
SARS Coronavirus 2 by RT PCR: NEGATIVE

## 2021-08-04 MED ORDER — SODIUM CHLORIDE 0.9 % IV SOLN: INTRAVENOUS | Status: DC

## 2021-08-04 NOTE — ED Notes (Signed)
Introduced self to pt for first time. Pt resting comfortably in bed at this time. Pt is alert and oriented with even and regular respirations. No acute distress noted. Pt denies any needs at this time. Call light within reach. Family remains at bedside.

## 2021-08-04 NOTE — ED Notes (Signed)
[facility] called to take report

## 2021-08-04 NOTE — ED Provider Notes (Addendum)
Today's Vitals   08/03/21 2335 08/04/21 0400 08/04/21 0500 08/04/21 0600  BP: 133/71 119/71 122/69 117/65  Pulse: 75 67 69 69  Resp: 17 15 19 17   Temp: 98.4 F (36.9 C)     TempSrc:      SpO2: 100% 97% 95% 96%  Weight:      Height:      PainSc:       Body mass index is 20.22 kg/m.  6:40 AM  Pt here with a distal esophageal perforation.  Patient has a bed available at [facility] but is currently awaiting available transport.  I have reassessed patient and he continues to be well-appearing, hemodynamically stable, resting comfortably.  He denies any pain or vomiting.  He has received vancomycin, Ancef, Flagyl.  He is n.p.o. and getting IV fluids.  Updated patient and family.       Melvin Gilmore, Melvin Bison, DO 08/04/21 [id number]

## 2021-08-04 NOTE — ED Notes (Signed)
[facility] withdrew transport spoke w/ Suanne Marker @ [facility] transport pt is on there list for transport but [facility] will not have transport available till after 7am

## 2021-08-04 NOTE — ED Notes (Signed)
Called [facility] @ 2:45 spoke with [place] no ETA on transport

## 2021-08-04 NOTE — ED Notes (Signed)
[facility] transport also advised to reach out to [facility] in the am also.

## 2021-10-18 DIAGNOSIS — E785 Hyperlipidemia, unspecified: Secondary | ICD-10-CM | POA: Insufficient documentation

## 2021-10-18 DIAGNOSIS — J309 Allergic rhinitis, unspecified: Secondary | ICD-10-CM | POA: Insufficient documentation

## 2021-10-18 DIAGNOSIS — F419 Anxiety disorder, unspecified: Secondary | ICD-10-CM | POA: Insufficient documentation

## 2021-10-18 DIAGNOSIS — K829 Disease of gallbladder, unspecified: Secondary | ICD-10-CM | POA: Insufficient documentation

## 2021-10-18 DIAGNOSIS — Z8619 Personal history of other infectious and parasitic diseases: Secondary | ICD-10-CM | POA: Insufficient documentation

## 2021-10-18 DIAGNOSIS — I10 Essential (primary) hypertension: Secondary | ICD-10-CM | POA: Diagnosis present

## 2021-10-18 DIAGNOSIS — Z9109 Other allergy status, other than to drugs and biological substances: Secondary | ICD-10-CM | POA: Insufficient documentation

## 2021-10-18 DIAGNOSIS — R0789 Other chest pain: Secondary | ICD-10-CM | POA: Insufficient documentation

## 2021-10-18 DIAGNOSIS — H8109 Meniere's disease, unspecified ear: Secondary | ICD-10-CM | POA: Insufficient documentation

## 2021-11-29 ENCOUNTER — Other Ambulatory Visit: Payer: Self-pay | Admitting: Urology

## 2021-11-29 DIAGNOSIS — R972 Elevated prostate specific antigen [PSA]: Secondary | ICD-10-CM

## 2021-12-11 ENCOUNTER — Ambulatory Visit
Admission: RE | Admit: 2021-12-11 | Discharge: 2021-12-11 | Disposition: A | Discharge status: Home / Self Care | Payer: Medicare Other | Source: Ambulatory Visit | Attending: Urology | Admitting: Urology

## 2021-12-11 DIAGNOSIS — R972 Elevated prostate specific antigen [PSA]: Secondary | ICD-10-CM | POA: Diagnosis present

## 2021-12-11 MED ORDER — GADOBUTROL 1 MMOL/ML IV SOLN
7.0000 mL | Freq: Once | INTRAVENOUS | Status: AC | PRN
  Administered 2021-12-11: 7 mL via INTRAVENOUS

## 2021-12-31 ENCOUNTER — Encounter
Admission: RE | Admit: 2021-12-31 | Discharge: 2021-12-31 | Disposition: A | Discharge status: Home / Self Care | Payer: Medicare Other | Source: Ambulatory Visit | Attending: Urology | Admitting: Urology

## 2021-12-31 HISTORY — DX: Personal history of other diseases of the digestive system: Z87.19

## 2021-12-31 HISTORY — DX: Elevated prostate specific antigen (PSA): R97.20

## 2021-12-31 HISTORY — DX: Malignant (primary) neoplasm, unspecified: C80.1

## 2021-12-31 NOTE — Patient Instructions (Signed)
Your procedure is scheduled on:01-03-22 Thursday Report to the Registration Desk on the 1st floor of the [place].Then proceed to the 2nd floor Surgery Desk To find out your arrival time, please call [phone] between 1PM - 3PM on:01-02-22 Wednesday If your arrival time is 6:00 am, do not arrive prior to that time as the [place] entrance doors do not open until 6:00 am.  REMEMBER: Instructions that are not followed completely may result in serious medical risk, up to and including death; or upon the discretion of your surgeon and anesthesiologist your surgery may need to be rescheduled.  Do not eat food after midnight the night before surgery.  No gum chewing, lozengers or hard candies.  You may however, drink CLEAR liquids up to 2 hours before you are scheduled to arrive for your surgery. Do not drink anything within 2 hours of your scheduled arrival time.  Clear liquids include: - water  - apple juice without pulp - gatorade (not RED colors) - black coffee or tea (Do NOT add milk or creamers to the coffee or tea) Do NOT drink anything that is not on this list.  TAKE THESE MEDICATIONS THE MORNING OF SURGERY WITH A SIP OF WATER: -levothyroxine (SYNTHROID, LEVOTHROID)  -omeprazole (PRILOSEC)-take one the night before and one on the morning of surgery - helps to prevent nausea after surgery.) -You may take ALPRAZolam Duanne Moron) the morning of surgery if needed for anxiety  One week prior to surgery: Stop Anti-inflammatories (NSAIDS) such as Advil, Aleve, Ibuprofen, Motrin, Naproxen, Naprosyn and Aspirin based products such as Excedrin, Goodys Powder, BC Powder.You may however, take Tylenol if needed for pain up until the day of surgery. Stop ANY OVER THE COUNTER supplements/vitamins NOW (12-31-21) until after surgery (Plant Sterols and Stanols (CHOLESTOFF) and Probiotic)  No Alcohol for 24 hours before or after surgery.  No Smoking including e-cigarettes for 24 hours prior to  surgery.  No chewable tobacco products for at least 6 hours prior to surgery.  No nicotine patches on the day of surgery.  Do not use any "recreational" drugs for at least a week prior to your surgery.  Please be advised that the combination of cocaine and anesthesia may have negative outcomes, up to and including death. If you test positive for cocaine, your surgery will be cancelled.  On the morning of surgery brush your teeth with toothpaste and water, you may rinse your mouth with mouthwash if you wish. Do not swallow any toothpaste or mouthwash  Do not wear jewelry, make-up, hairpins, clips or nail polish.  Do not wear lotions, powders, or perfumes.   Do not shave body from the neck down 48 hours prior to surgery just in case you cut yourself which could leave a site for infection.  Also, freshly shaved skin may become irritated if using the CHG soap.  Contact lenses, hearing aids and dentures may not be worn into surgery.  Do not bring valuables to the hospital. [facility] is not responsible for any missing/lost belongings or valuables.   Fleets enema as directed-Do Fleet Enema at home the day of surgery-Do enema 1 hour prior to arrival time to the hospital-You may repeat until clear per Dr Yves Dill  Notify your doctor if there is any change in your medical condition (cold, fever, infection).  Wear comfortable clothing (specific to your surgery type) to the hospital.  After surgery, you can help prevent lung complications by doing breathing exercises.  Take deep breaths and cough every 1-2  hours. Your doctor may order a device called an Incentive Spirometer to help you take deep breaths. When coughing or sneezing, hold a pillow firmly against your incision with both hands. This is called "splinting." Doing this helps protect your incision. It also decreases belly discomfort.  If you are being admitted to the hospital overnight, leave your suitcase in the car. After surgery it may  be brought to your room.  If you are being discharged the day of surgery, you will not be allowed to drive home. You will need a responsible adult (18 years or older) to drive you home and stay with you that night.   If you are taking public transportation, you will need to have a responsible adult (18 years or older) with you. Please confirm with your physician that it is acceptable to use public transportation.   Please call the [place] Dept. at [phone] if you have any questions about these instructions.  Surgery Visitation Policy:  Patients undergoing a surgery or procedure may have two family members or support persons with them as long as the person is not COVID-19 positive or experiencing its symptoms.

## 2022-01-02 NOTE — H&P (Signed)
Urology Admission H&P  Chief Complaint: Elevated PSA and abnormal prostate MRI  History of Present Illness: 81 year old male with PSA of 8.53 ng/mL and MRI with PIRADS category 5 lesion of left anterior transition zone. Prostate size is 35.81 gm. Here now for fusion biopsy.   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   Seasonal allergies    Wears partial dentures    lower   Past Surgical History:  Procedure Laterality Date   APPENDECTOMY     CATARACT EXTRACTION W/PHACO Right 01/15/2016   Procedure: CATARACT EXTRACTION PHACO AND INTRAOCULAR LENS PLACEMENT ([place]);  Surgeon: Ronnell Freshwater, MD;  Location: [facility];  Service: Ophthalmology;  Laterality: Right;  RIGHT CALL CELL PHONE WITH TIME   CHOLECYSTECTOMY     COLONOSCOPY     COLONOSCOPY WITH ESOPHAGOGASTRODUODENOSCOPY (EGD)     ESOPHAGOGASTRODUODENOSCOPY  07/2021   ESOPHAGOGASTRODUODENOSCOPY (EGD) WITH PROPOFOL N/A 09/23/2017   Procedure: ESOPHAGOGASTRODUODENOSCOPY (EGD) WITH PROPOFOL;  Surgeon: Toledo, [name], MD;  Location: [facility] END[facility];  Service: Gastroenterology;  Laterality: N/A;   HYDROCELE EXCISION / REPAIR     LASIX RT EYE     ROTATOR CUFF REPAIR Left 2001    Home Medications:  No current facility-administered medications for this encounter.   Current Outpatient Medications  Medication Sig Dispense Refill Last Dose   acetaminophen (TYLENOL) 500 MG tablet Take 500 mg by mouth every 6 (six) hours as needed.      ALPRAZolam (XANAX) 0.5 MG tablet Take 0.25-0.5 mg by mouth at bedtime as needed for  anxiety.      cetirizine (ZYRTEC) 10 MG tablet Take 10 mg by mouth as needed for allergies.      fluticasone (FLONASE) 50 MCG/ACT nasal spray Place 1 spray into both nostrils daily.      levothyroxine (SYNTHROID, LEVOTHROID) 50 MCG tablet Take 50 mcg by mouth daily before breakfast.      omeprazole (PRILOSEC) 40 MG capsule Take 40 mg by mouth every morning.      Plant Sterols and Stanols (CHOLESTOFF) 450 MG TABS Take 1 tablet by mouth every morning.      Probiotic Product (PROBIOTIC ADVANCED PO) Take 1 tablet by mouth at bedtime.      tadalafil (CIALIS) 5 MG tablet Take 5 mg by mouth daily as needed for erectile dysfunction.      tamsulosin (FLOMAX) 0.4 MG CAPS capsule Take 0.4 mg by mouth daily after supper.      Allergies:  Allergies  Allergen Reactions   Augmentin [Amoxicillin-Pot Clavulanate] Rash   Mobic [Meloxicam] Rash    No family history on file. Social History:  reports that he has never smoked. He has never used smokeless tobacco. He reports current alcohol use of about 7.0 standard drinks of alcohol per week. He reports that he does not use drugs.   Review of Systems:  Denies CP, SOB, DM, CAD  Physical Exam:  Ht. 5"9"  Wt. 144 lbs.  BMI 21 HEENT:  PEERLA.  EOMI Neck: No masses or tenderness Chest: Lungs clear to auscultation CV: RRR Abd:  Soft. Non-tender GU: Circumcised. Nl. Testes Rectal: Prostate 35 gm., smooth, nontender Neuro: Alert and oriented X3   Physical Exam:  Laboratory Data:  No results found for this or any previous visit (from the past 24 hour(s)). No results found for this or any previous visit (from the past 240 hour(s)). Creatinine: No results for input(s): "CREATININE" in the last 168 hours. Baseline Creatinine: unknown  Impression/Assessment:  Elevated PSA and abnormal prostate MRI  Plan:  Vernelle Emerald fusion biopsy  Royston Cowper 01/02/2022, 8:32 AM

## 2022-01-03 ENCOUNTER — Ambulatory Visit: Payer: Medicare Other | Admitting: Anesthesiology

## 2022-01-03 ENCOUNTER — Encounter
Admission: RE | Disposition: A | Discharge status: Home / Self Care | Payer: Self-pay | Source: Home / Self Care | Attending: Urology

## 2022-01-03 ENCOUNTER — Other Ambulatory Visit: Payer: Self-pay

## 2022-01-03 ENCOUNTER — Encounter: Payer: Self-pay | Admitting: Urology

## 2022-01-03 ENCOUNTER — Ambulatory Visit
Admission: RE | Admit: 2022-01-03 | Discharge: 2022-01-03 | Disposition: A | Discharge status: Home / Self Care | Payer: Medicare Other | Attending: Urology | Admitting: Urology

## 2022-01-03 DIAGNOSIS — N401 Enlarged prostate with lower urinary tract symptoms: Secondary | ICD-10-CM | POA: Insufficient documentation

## 2022-01-03 DIAGNOSIS — C61 Malignant neoplasm of prostate: Secondary | ICD-10-CM | POA: Insufficient documentation

## 2022-01-03 DIAGNOSIS — E039 Hypothyroidism, unspecified: Secondary | ICD-10-CM | POA: Diagnosis not present

## 2022-01-03 DIAGNOSIS — K219 Gastro-esophageal reflux disease without esophagitis: Secondary | ICD-10-CM | POA: Diagnosis not present

## 2022-01-03 HISTORY — PX: PROSTATE BIOPSY: SHX241

## 2022-01-03 SURGERY — BIOPSY, PROSTATE
Anesthesia: General | Site: Prostate

## 2022-01-03 MED ORDER — PROPOFOL 10 MG/ML IV BOLUS
INTRAVENOUS | Status: DC | PRN
  Administered 2022-01-03: 10 mg via INTRAVENOUS
  Administered 2022-01-03: 30 mg via INTRAVENOUS

## 2022-01-03 MED ORDER — CHLORHEXIDINE GLUCONATE 0.12 % MT SOLN: 15.0000 mL | Freq: Once | OROMUCOSAL | Status: DC

## 2022-01-03 MED ORDER — CEFAZOLIN SODIUM-DEXTROSE 1-4 GM/50ML-% IV SOLN
1.0000 g | Freq: Once | INTRAVENOUS | Status: AC
  Administered 2022-01-03: 1 g via INTRAVENOUS

## 2022-01-03 MED ORDER — ONDANSETRON HCL 4 MG/2ML IJ SOLN
INTRAMUSCULAR | Status: DC | PRN
  Administered 2022-01-03: 4 mg via INTRAVENOUS

## 2022-01-03 MED ORDER — OXYCODONE HCL 5 MG PO TABS: 5.0000 mg | ORAL_TABLET | Freq: Once | ORAL | Status: DC | PRN

## 2022-01-03 MED ORDER — LIDOCAINE HCL (PF) 2 % IJ SOLN
INTRAMUSCULAR | Status: AC
  Filled 2022-01-03: qty 5

## 2022-01-03 MED ORDER — FENTANYL CITRATE (PF) 100 MCG/2ML IJ SOLN
INTRAMUSCULAR | Status: AC
  Filled 2022-01-03: qty 2

## 2022-01-03 MED ORDER — LACTATED RINGERS IV SOLN: INTRAVENOUS | Status: DC

## 2022-01-03 MED ORDER — FENTANYL CITRATE (PF) 100 MCG/2ML IJ SOLN
INTRAMUSCULAR | Status: DC | PRN
  Administered 2022-01-03 (×3): 25 ug via INTRAVENOUS

## 2022-01-03 MED ORDER — LEVOFLOXACIN 500 MG PO TABS: 500.0000 mg | ORAL_TABLET | Freq: Every day | ORAL | 0 refills | Status: DC

## 2022-01-03 MED ORDER — GENTAMICIN SULFATE 40 MG/ML IJ SOLN
80.0000 mg | Freq: Once | INTRAVENOUS | Status: AC
  Administered 2022-01-03: 80 mg via INTRAVENOUS
  Filled 2022-01-03: qty 2

## 2022-01-03 MED ORDER — ORAL CARE MOUTH RINSE: 15.0000 mL | Freq: Once | OROMUCOSAL | Status: DC

## 2022-01-03 MED ORDER — FENTANYL CITRATE (PF) 100 MCG/2ML IJ SOLN: 25.0000 ug | INTRAMUSCULAR | Status: DC | PRN

## 2022-01-03 MED ORDER — EPHEDRINE SULFATE (PRESSORS) 50 MG/ML IJ SOLN
INTRAMUSCULAR | Status: DC | PRN
  Administered 2022-01-03: 10 mg via INTRAVENOUS
  Administered 2022-01-03: 5 mg via INTRAVENOUS

## 2022-01-03 MED ORDER — CHLORHEXIDINE GLUCONATE 0.12 % MT SOLN
OROMUCOSAL | Status: AC
  Administered 2022-01-03: 15 mL
  Filled 2022-01-03: qty 15

## 2022-01-03 MED ORDER — CEFAZOLIN SODIUM-DEXTROSE 1-4 GM/50ML-% IV SOLN
INTRAVENOUS | Status: AC
  Filled 2022-01-03: qty 50

## 2022-01-03 MED ORDER — FLEET ENEMA 7-19 GM/118ML RE ENEM: 1.0000 | ENEMA | Freq: Once | RECTAL | Status: DC

## 2022-01-03 MED ORDER — OXYCODONE HCL 5 MG/5ML PO SOLN: 5.0000 mg | Freq: Once | ORAL | Status: DC | PRN

## 2022-01-03 MED ORDER — PROPOFOL 500 MG/50ML IV EMUL
INTRAVENOUS | Status: DC | PRN
  Administered 2022-01-03: 150 ug/kg/min via INTRAVENOUS

## 2022-01-03 MED ORDER — ONDANSETRON HCL 4 MG/2ML IJ SOLN
INTRAMUSCULAR | Status: AC
  Filled 2022-01-03: qty 2

## 2022-01-03 SURGICAL SUPPLY — 16 items
COVER MAYO STAND REUSABLE (DRAPES) ×2 IMPLANT
COVER TRANSDUCER UNLTRASOUND (MISCELLANEOUS) ×1 IMPLANT
CUP MEDICINE 2OZ PLAST GRAD ST (MISCELLANEOUS) ×2 IMPLANT
GLOVE BIOGEL M STRL SZ7.5 (GLOVE) ×2 IMPLANT
GUIDE NDL URONAV ULTRASND S (MISCELLANEOUS) IMPLANT
GUIDE NEEDLE URONAV ULTRASND S (MISCELLANEOUS) ×1 IMPLANT
INST BIOPSY MAXCORE 18GX25 (NEEDLE) ×2 IMPLANT
PROBE URONAV BK 8808E 8818 HLD (MISCELLANEOUS) IMPLANT
STRAP SAFETY 5IN WIDE (MISCELLANEOUS) ×2 IMPLANT
SURGILUBE 2OZ TUBE FLIPTOP (MISCELLANEOUS) ×2 IMPLANT
TOWEL OR 17X26 4PK STRL BLUE (TOWEL DISPOSABLE) ×2 IMPLANT
URONAV BK 8808E 8818 PROBE HLD (MISCELLANEOUS) ×2 IMPLANT
URONAV MRI FUSION TWO PATIENTS (MISCELLANEOUS) ×1 IMPLANT
URONAV ULTRASOUND (MISCELLANEOUS) ×1 IMPLANT
URONAV ULTRASOUND NDL GUIDE S (MISCELLANEOUS) ×2 IMPLANT
WATER STERILE IRR 500ML POUR (IV SOLUTION) ×2 IMPLANT

## 2022-01-03 NOTE — Anesthesia Postprocedure Evaluation (Signed)
Anesthesia Post Note  Patient: Melvin Gilmore  Procedure(s) Performed: PROSTATE BIOPSY  URONAV (Prostate)  Patient location during evaluation: PACU Anesthesia Type: General Level of consciousness: awake and alert Pain management: pain level controlled Vital Signs Assessment: post-procedure vital signs reviewed and stable Respiratory status: spontaneous breathing, nonlabored ventilation, respiratory function stable and patient connected to nasal cannula oxygen Cardiovascular status: blood pressure returned to baseline and stable Postop Assessment: no apparent nausea or vomiting Anesthetic complications: no   No notable events documented.   Last Vitals:  Vitals:   01/03/22 1101 01/03/22 1120  BP: 133/68 (!) 146/72  Pulse: 61 (!) 57  Resp: 14 18  Temp: (!) 36.3 C (!) 36 C  SpO2: 100% 100%    Last Pain:  Vitals:   01/03/22 1120  TempSrc: Temporal  PainSc: 0-No pain                 Precious Haws Jadine Brumley

## 2022-01-03 NOTE — Discharge Instructions (Addendum)
Transrectal Ultrasound-Guided Prostate Biopsy, Care After What can I expect after the procedure? After the procedure, it is common to have: Pain and discomfort near your butt (rectum), especially while sitting. Pink-colored pee (urine). This is due to small amounts of blood in your pee. A burning feeling while peeing. Blood in your poop (stool). Bleeding from your butt. Blood in your semen. Follow these instructions at home: Medicines Take over-the-counter and prescription medicines only as told by your doctor. If you were given a sedative during your procedure, do not drive or use machines until your doctor says that it is safe. A sedative is a medicine that helps you relax. If you were prescribed an antibiotic medicine, take it as told by your doctor. Do not stop taking it even if you start to feel better. Activity A sign showing that a person should not lift anything heavy.   Return to your normal activities when your doctor says that it is safe. Ask your doctor when it is okay for you to have sex. You may have to avoid lifting. Ask your doctor how much you can safely lift. General instructions A comparison of three sample cups showing dark yellow, yellow, and pale yellow urine.   Drink enough water to keep your pee pale yellow. Watch your pee, poop, and semen for new bleeding or bleeding that gets worse. Keep all follow-up visits. Contact a doctor if: You have any of these: Blood clots in your pee or poop. Blood in your pee more than 2 weeks after the procedure. Blood in your semen more than 2 months after the procedure. New or worse bleeding in your pee, poop, or semen. Very bad belly pain. Your pee smells bad or unusual. You have trouble peeing. Your lower belly feels firm. You have problems getting an erection. You feel like you may vomit (are nauseous), or you vomit. Get help right away if: You have a fever or chills. You have bright red pee. You have very bad pain  that does not get better with medicine. You cannot pee. Summary After this procedure, it is common to have pain and discomfort near your butt, especially while sitting. You may have blood in your pee and poop. It is common to have blood in your semen. Get help right away if you have a fever or chills. This information is not intended to replace advice given to you by your health care provider. Make sure you discuss any questions you have with your health care provider. Document Revised: 01/01/2021 Document Reviewed: 01/01/2021 Elsevier Patient Education  2023 Else[employer] Inc.    AMBULATORY SURGERY  DISCHARGE INSTRUCTIONS   The drugs that you were given will stay in your system until tomorrow so for the next 24 hours you should not:  Drive an automobile Make any legal decisions Drink any alcoholic beverage   You may resume regular meals tomorrow.  Today it is better to start with liquids and gradually work up to solid foods.  You may eat anything you prefer, but it is better to start with liquids, then soup and crackers, and gradually work up to solid foods.   Please notify your doctor immediately if you have any unusual bleeding, trouble breathing, redness and pain at the surgery site, drainage, fever, or pain not relieved by medication.    Additional Instructions:        Please contact your physician with any problems or Same Day Surgery at [phone], Monday through Friday 6 am to 4 pm, or   [place] [facility] [place] Main number at [phone].    

## 2022-01-03 NOTE — H&P (Signed)
Date of Initial H&P: 01/02/22  History reviewed, patient examined, no change in status, stable for surgery.

## 2022-01-03 NOTE — Anesthesia Preprocedure Evaluation (Signed)
Anesthesia Evaluation  Patient identified by MRN, date of birth, ID band Patient awake    Reviewed: Allergy & Precautions, NPO status , Patient's Chart, lab work & pertinent test results  History of Anesthesia Complications Negative for: history of anesthetic complications  Airway Mallampati: III  TM Distance: >3 FB Neck ROM: full    Dental  (+) Chipped   Pulmonary neg pulmonary ROS, neg shortness of breath,    Pulmonary exam normal        Cardiovascular Exercise Tolerance: Good (-) anginanegative cardio ROS Normal cardiovascular exam(-) Valvular Problems/Murmurs     Neuro/Psych PSYCHIATRIC DISORDERS negative neurological ROS     GI/Hepatic Neg liver ROS, hiatal hernia, GERD  Controlled,  Endo/Other  Hypothyroidism   Renal/GU negative Renal ROS  negative genitourinary   Musculoskeletal   Abdominal   Peds  Hematology negative hematology ROS (+)   Anesthesia Other Findings Past Medical History: No date: Anxiety No date: Cancer [facility])     Comment:  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 No date: 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 No date: Elevated lipids No date: Elevated PSA 08/03/2021: Esophageal rupture     Comment:  Distal with moderate free air surrounding Hiatal Hernia No date: Gastritis No date: GERD (gastroesophageal reflux disease) No date: History of hiatal hernia No date: Hypothyroidism No date: Meniere disease     Comment:  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 No date: Seasonal allergies No date: Wears partial dentures     Comment:  lower  Past Surgical History: No date: APPENDECTOMY 01/15/2016: CATARACT EXTRACTION W/PHACO; Right     Comment:  Procedure: CATARACT EXTRACTION PHACO AND INTRAOCULAR               LENS PLACEMENT ([place]);  Surgeon: Ronnell Freshwater,              MD;  Location: [place];  Service:               Ophthalmology;  Laterality: Right;   RIGHT CALL CELL               PHONE WITH TIME No date: CHOLECYSTECTOMY No date: COLONOSCOPY No date: COLONOSCOPY WITH ESOPHAGOGASTRODUODENOSCOPY (EGD) 07/2021: ESOPHAGOGASTRODUODENOSCOPY 09/23/2017: ESOPHAGOGASTRODUODENOSCOPY (EGD) WITH PROPOFOL; N/A     Comment:  Procedure: ESOPHAGOGASTRODUODENOSCOPY (EGD) WITH               PROPOFOL;  Surgeon: Toledo, [name], MD;  Location:               [facility];  Service: Gastroenterology;  Laterality:               N/A; No date: HYDROCELE EXCISION / REPAIR No date: LASIX RT EYE 2001: [place]; Left  BMI    Body Mass Index: 20.22 kg/m      Reproductive/Obstetrics negative OB ROS                             Anesthesia Physical Anesthesia Plan  ASA: 3  Anesthesia Plan: General   Post-op Pain Management:    Induction: Intravenous  PONV Risk Score and Plan: Propofol infusion and TIVA  Airway Management Planned: Natural Airway and Nasal Cannula  Additional Equipment:   Intra-op Plan:   Post-operative Plan:   Informed Consent: I have reviewed the patients History and Physical, chart, labs and discussed the procedure including the risks, benefits and alternatives for the proposed anesthesia  with the patient or authorized representative who has indicated his/her understanding and acceptance.     Dental Advisory Given  Plan Discussed with: Anesthesiologist, CRNA and Surgeon  Anesthesia Plan Comments: (Patient and wife consented for risks of anesthesia including but not limited to:  - adverse reactions to medications - risk of airway placement if required - damage to eyes, teeth, lips or other oral mucosa - nerve damage due to positioning  - sore throat or hoarseness - Damage to heart, brain, nerves, lungs, other parts of body or loss of life  They voiced understanding.)        Anesthesia Quick Evaluation

## 2022-01-03 NOTE — Transfer of Care (Signed)
Immediate Anesthesia Transfer of Care Note  Patient: Melvin Gilmore  Procedure(s) Performed: PROSTATE BIOPSY  URONAV (Prostate)  Patient Location: PACU  Anesthesia Type:General  Level of Consciousness: awake  Airway & Oxygen Therapy: Patient Spontanous Breathing and Patient connected to face mask oxygen  Post-op Assessment: Report given to RN and Post -op Vital signs reviewed and stable  Post vital signs: Reviewed and stable  Last Vitals:  Vitals Value Taken Time  BP 102/53 01/03/22 1030  Temp 36.1 C 01/03/22 1025  Pulse 58 01/03/22 1034  Resp 14 01/03/22 1034  SpO2 100 % 01/03/22 1034  Vitals shown include unvalidated device data.  Last Pain:  Vitals:   01/03/22 1025  TempSrc:   PainSc: Asleep      Patients Stated Pain Goal: 0 (84/06/98 6148)  Complications: No notable events documented.

## 2022-01-03 NOTE — Op Note (Signed)
Preoperative diagnosis: 1.  Elevated PSA (R97.2)                                           2.  PI-RADS category 5 lesion of anterior left transition zone (N40.3, R93.8)  Postoperative diagnosis: Same  Procedure: Uronav fusion prostate biopsy (CPT [phone])  Surgeon: Otelia Limes. Yves Dill MD  Anesthesia: General  Indications:81 year old (05/30/1941)  male with PSA of 8.53 ng/mL and MRI with PIRADS category 5 lesion of left anterior transition zone. Prostate size is 35.81 gm. Here now for fusion biopsy. See the history and physical also. After informed consent the above procedure(s) were requested     Technique and findings: After adequate general anesthesia been obtained patient was placed into left lateral decubitus position and DRE was performed.  The rectal vault was noted to be clear.  Ultrasound probe was placed and images acquired.  The ultrasound images were then fused with the MRI images.  The region of interest was identified and 4 core biopsies taken here.  At this point standard 12 core systematic core biopsies were performed.  The ultrasound probe was then removed.  Blood loss was minimal.  The procedure was then terminated and patient transferred to the recovery room in stable condition.

## 2022-01-04 LAB — SURGICAL PATHOLOGY

## 2022-01-04 NOTE — H&P (Unsigned)
NAME: Melvin Gilmore, Melvin Gilmore MEDICAL RECORD NO: [id number] ACCOUNT NO: [id number] DATE OF BIRTH: [birth year] FACILITY: [facility] LOCATION: [facility] PHYSICIAN: Otelia Limes. Yves Dill, MD  History and Physical   DATE OF ADMISSION: 01/03/2022  Same day surgery 01/03/2022.  CHIEF COMPLAINT:  Elevated PSA and abnormal prostate scan/MRI scan.  HISTORY OF PRESENT ILLNESS:  The patient is an 81 year old white male with history of BPH with lower urinary tract symptoms and elevated PSA.  More recently, the PSA increased up to 8.53 ng/mL and Exosome IntelliScore was above the cutoff for higher risk  of high-grade prostate cancer.  He underwent a prostate MRI scan on 12/11/2021 which revealed a 35.81 mL prostate with a PI-RADS category 5 lesion of the left anterior transition zone.  He comes in now for UroNav fusion biopsy of the prostate gland.  PAST MEDICAL HISTORY:  ALLERGIES:  THE PATIENT WAS ALLERGIC TO MOBIC, AND AUGMENTIN.  CURRENT MEDICATIONS:  Included alprazolam, CholestOff, Flonase, Motrin, Synthroid, Prilosec, probiotics and tadalafil.  PAST SURGICAL HISTORY:  1.  Rotator cuff repair 2000. 2.  Cholecystectomy 2008.  CURRENT MEDICAL CONDITIONS:  1.  Hypertension. 2.  Depression. 3.  Hypothyroidism. 4.  Erectile dysfunction. 5.  GERD.  REVIEW OF SYSTEMS:  The patient has decreased auditory acuity.  He has allergic rhinitis.  Denied chest pain, shortness of breath, diabetes, stroke or heart disease.  SOCIAL HISTORY:  The patient denied tobacco use.  Consumes 4 alcoholic beverage per week.  FAMILY HISTORY:  Father died at age 65 with pneumonia.  Mother died at age 30 of pneumonia.  PHYSICAL EXAMINATION:   VITAL SIGNS:  Height was 5 feet 10 inches, weight 148 pounds, BMI 22. GENERAL:  Well-nourished white male in no acute distress. HEENT:  Sclerae were clear.  Pupils are equally round, reactive to light and accommodation.  Extraocular movements are intact. NECK:  No palpable masses or  tenderness. LYMPHATIC:  No palpable cervical or inguinal adenopathy. PULMONARY:  Clear to auscultation. CARDIOVASCULAR:  Regular rhythm and rate. ABDOMEN:  Soft, nontender abdomen.  No CVA tenderness. GENITOURINARY:  Circumcised.  Testes smooth, nontender, 20 mL in size each.  RECTAL: 35 gram smooth, nontender prostate. NEUROMUSCULAR:  Alert and oriented x 3.  IMPRESSION:  1.  Elevated PSA. 2.  PI-RADS category 5 lesion of the prostate. 3.  Elevated Exosome IntelliScore.  PLAN:  UroNav fusion biopsy of the prostate.   PAA D: 12/28/2021 11:47:47 am T: 12/28/2021 12:06:00 pm  JOB: [id number]/ [id number]

## 2022-01-08 ENCOUNTER — Encounter: Payer: Self-pay | Admitting: Urology

## 2022-01-24 ENCOUNTER — Encounter: Payer: Self-pay | Admitting: Urology

## 2022-01-28 NOTE — H&P (Signed)
NAME: Melvin Gilmore, CHO MEDICAL RECORD NO: [id number] ACCOUNT NO: [id number] DATE OF BIRTH: [birth year] PHYSICIAN: Otelia Limes. Yves Dill, MD  History and Physical   DATE OF ADMISSION: 02/07/2022  Same day surgery 02/07/2022  CHIEF COMPLAINT:  Prostate cancer.  HISTORY OF PRESENT ILLNESS:  The patient is an 81 year old white male with elevated PSA of 8.5 ng/mL that was evaluated with MRI scan in 11/2021.  The MRI scan indicated a 35.8 mL prostate with a 1 mL category 5 lesion of the anterior transition zone.   He underwent fusion biopsy revealing a Gleason's grade 3+4 adenocarcinoma involving the region of interest and all the other systematic core biopsies were negative.  He had genomic testing with Oncotype DX GPS score of 20, which indicates a 31%  likelihood of adverse pathology at the time of radical prostatectomy.  He has chosen HIFU as his preferred treatment option and comes in now for HIFU treatment.  PAST MEDICAL HISTORY:  ALLERGIES:  He was allergic to [facility] AND AUGMENTIN.  CURRENT MEDICATIONS:  Included alprazolam, cholesterol, Flonase, Motrin, Synthroid, Prilosec, probiotics and tadalafil.  PAST SURGICAL HISTORY:  1.  Rotator cuff repair, 2000. 2.  Cholecystectomy, 2008.  PAST AND CURRENT MEDICAL CONDITIONS:  1.  Hypertension. 2.  Depression. 3.  Hypothyroidism. 4.  Erectile dysfunction. 5.  GERD.  REVIEW OF SYSTEMS:  The patient reports decreased auditory acuity.  He has allergic rhinitis.  He denies chest pain, shortness of breath, diabetes, stroke or heart disease.  SOCIAL HISTORY:  The patient denied tobacco use.  He consumes four alcoholic beverages per week.  FAMILY HISTORY:  Father died at age 40 with pneumonia.  Mother died at age 20 of pneumonia.  PHYSICAL EXAMINATION:   VITAL SIGNS:  Height was 5 feet 10 inches, weight 148 pounds, BMI 22. GENERAL:  Well-nourished white male in no acute distress. HEENT:  Sclerae were clear.  Pupils were equally round, reactive  to light and accommodation.  Extraocular movements were intact. NECK:  No palpable masses or tenderness.  No audible carotid bruits. LYMPHATIC:  No palpable cervical or inguinal adenopathy. PULMONARY:  Lungs clear to auscultation. CARDIOVASCULAR:  Regular rhythm and rate. ABDOMEN:  Soft, nontender abdomen. GENITOURINARY:  Circumcised.  Testes were smooth, nontender, approximately 20 mL in size each.  RECTAL: 35 gram smooth, nontender prostate. NEUROMUSCULAR:  Alert and oriented x3.  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  PLAN:  HIFU treatment.     PAA D: 01/25/2022 11:41:37 am T: 01/25/2022 12:02:00 pm  JOB: [id number]/ [id number]

## 2022-01-30 ENCOUNTER — Encounter
Admission: RE | Admit: 2022-01-30 | Discharge: 2022-01-30 | Disposition: A | Discharge status: Home / Self Care | Payer: Medicare Other | Source: Ambulatory Visit | Attending: Urology | Admitting: Urology

## 2022-01-30 DIAGNOSIS — Z01818 Encounter for other preprocedural examination: Secondary | ICD-10-CM

## 2022-01-30 DIAGNOSIS — E871 Hypo-osmolality and hyponatremia: Secondary | ICD-10-CM

## 2022-01-30 NOTE — Patient Instructions (Signed)
Your procedure is scheduled on:02-07-22 Thursday Report to the Registration Desk on the 1st floor of the [place].Then proceed to the 2nd floor Surgery Desk To find out your arrival time, please call [phone] between 1PM - 3PM on:02-06-22 Wednesday If your arrival time is 6:00 am, do not arrive prior to that time as the [place] entrance doors do not open until 6:00 am.  REMEMBER: Instructions that are not followed completely may result in serious medical risk, up to and including death; or upon the discretion of your surgeon and anesthesiologist your surgery may need to be rescheduled.  Do not eat food after midnight the night before surgery.  No gum chewing, lozengers or hard candies.  You may however, drink CLEAR liquids up to 2 hours before you are scheduled to arrive for your surgery. Do not drink anything within 2 hours of your scheduled arrival time.  Clear liquids include: - water  - apple juice without pulp - gatorade (not RED colors) - black coffee or tea (Do NOT add milk or creamers to the coffee or tea) Do NOT drink anything that is not on this list.  TAKE THESE MEDICATIONS THE MORNING OF SURGERY WITH A SIP OF WATER: -levothyroxine (SYNTHROID, LEVOTHROID)  -omeprazole (PRILOSEC)-take one the night before surgery and one the morning of surgery -You may take ALPRAZolam Duanne Moron) the morning of surgery if needed for anxiety  One week prior to surgery: Stop Anti-inflammatories (NSAIDS) such as Advil, Aleve, Ibuprofen, Motrin, Naproxen, Naprosyn and Aspirin based products such as Excedrin, Goodys Powder, BC Powder.You may however, take Tylenol if needed for pain up until the day of surgery.  Stop ANY OVER THE COUNTER/vitamins NOW (01-30-22) supplements until after surgery (PROBIOTIC ADVANCED and Plant Sterols and Stanols (CHOLESTOFF)  No Alcohol for 24 hours before or after surgery.  No Smoking including e-cigarettes for 24 hours prior to surgery.  No chewable tobacco  products for at least 6 hours prior to surgery.  No nicotine patches on the day of surgery.  Do not use any "recreational" drugs for at least a week prior to your surgery.  Please be advised that the combination of cocaine and anesthesia may have negative outcomes, up to and including death. If you test positive for cocaine, your surgery will be cancelled.  On the morning of surgery brush your teeth with toothpaste and water, you may rinse your mouth with mouthwash if you wish. Do not swallow any toothpaste or mouthwash.  Do not wear jewelry, make-up, hairpins, clips or nail polish.  Do not wear lotions, powders, or perfumes.   Do not shave body from the neck down 48 hours prior to surgery just in case you cut yourself which could leave a site for infection.  Also, freshly shaved skin may become irritated if using the CHG soap.  Contact lenses, hearing aids and dentures may not be worn into surgery.  Do not bring valuables to the hospital. [facility] is not responsible for any missing/lost belongings or valuables.   Fleets enema as directed-Do Fleet Enema at home the morning of surgery 1 hour prior to your arrival time to the hospital-Repeat Enema until clear  Notify your doctor if there is any change in your medical condition (cold, fever, infection).  Wear comfortable clothing (specific to your surgery type) to the hospital.  After surgery, you can help prevent lung complications by doing breathing exercises.  Take deep breaths and cough every 1-2 hours. Your doctor may order a device called an  Incentive Spirometer to help you take deep breaths. When coughing or sneezing, hold a pillow firmly against your incision with both hands. This is called "splinting." Doing this helps protect your incision. It also decreases belly discomfort.  If you are being admitted to the hospital overnight, leave your suitcase in the car. After surgery it may be brought to your room.  If you are being  discharged the day of surgery, you will not be allowed to drive home. You will need a responsible adult (18 years or older) to drive you home and stay with you that night.   If you are taking public transportation, you will need to have a responsible adult (18 years or older) with you. Please confirm with your physician that it is acceptable to use public transportation.   Please call the [place] Dept. at [phone] if you have any questions about these instructions.  Surgery Visitation Policy:  Patients undergoing a surgery or procedure may have two family members or support persons with them as long as the person is not COVID-19 positive or experiencing its symptoms.

## 2022-02-01 ENCOUNTER — Other Ambulatory Visit: Payer: Self-pay | Admitting: Internal Medicine

## 2022-02-01 DIAGNOSIS — R079 Chest pain, unspecified: Secondary | ICD-10-CM

## 2022-02-03 ENCOUNTER — Encounter: Payer: Self-pay | Admitting: Urology

## 2022-02-04 ENCOUNTER — Ambulatory Visit
Admission: RE | Admit: 2022-02-04 | Discharge: 2022-02-04 | Disposition: A | Discharge status: Home / Self Care | Payer: Medicare Other | Source: Ambulatory Visit | Attending: Internal Medicine | Admitting: Internal Medicine

## 2022-02-04 DIAGNOSIS — R079 Chest pain, unspecified: Secondary | ICD-10-CM | POA: Insufficient documentation

## 2022-02-07 ENCOUNTER — Encounter
Admission: RE | Disposition: A | Discharge status: Home / Self Care | Payer: Self-pay | Source: Home / Self Care | Attending: Urology

## 2022-02-07 ENCOUNTER — Other Ambulatory Visit: Payer: Self-pay

## 2022-02-07 ENCOUNTER — Ambulatory Visit: Payer: Medicare Other | Admitting: Urgent Care

## 2022-02-07 ENCOUNTER — Encounter: Payer: Self-pay | Admitting: Urology

## 2022-02-07 ENCOUNTER — Ambulatory Visit
Admission: RE | Admit: 2022-02-07 | Discharge: 2022-02-07 | Disposition: A | Discharge status: Home / Self Care | Payer: Medicare Other | Attending: Urology | Admitting: Urology

## 2022-02-07 DIAGNOSIS — E039 Hypothyroidism, unspecified: Secondary | ICD-10-CM | POA: Insufficient documentation

## 2022-02-07 DIAGNOSIS — Z85828 Personal history of other malignant neoplasm of skin: Secondary | ICD-10-CM | POA: Insufficient documentation

## 2022-02-07 DIAGNOSIS — F419 Anxiety disorder, unspecified: Secondary | ICD-10-CM | POA: Insufficient documentation

## 2022-02-07 DIAGNOSIS — C61 Malignant neoplasm of prostate: Secondary | ICD-10-CM | POA: Insufficient documentation

## 2022-02-07 DIAGNOSIS — K219 Gastro-esophageal reflux disease without esophagitis: Secondary | ICD-10-CM | POA: Insufficient documentation

## 2022-02-07 DIAGNOSIS — Z01818 Encounter for other preprocedural examination: Secondary | ICD-10-CM

## 2022-02-07 DIAGNOSIS — E785 Hyperlipidemia, unspecified: Secondary | ICD-10-CM | POA: Diagnosis not present

## 2022-02-07 DIAGNOSIS — E871 Hypo-osmolality and hyponatremia: Secondary | ICD-10-CM

## 2022-02-07 HISTORY — DX: Hyperlipidemia, unspecified: E78.5

## 2022-02-07 HISTORY — PX: HIGH INTENSITY FOCUSED ULTRASOUND (HIFU) OF THE PROSTATE: SHX6793

## 2022-02-07 HISTORY — DX: Unspecified malignant neoplasm of skin, unspecified: C44.90

## 2022-02-07 HISTORY — DX: Cervicalgia: M54.2

## 2022-02-07 HISTORY — DX: Cholesterolosis of gallbladder: K82.4

## 2022-02-07 HISTORY — DX: Essential (primary) hypertension: I10

## 2022-02-07 HISTORY — DX: Other chest pain: R07.89

## 2022-02-07 LAB — BASIC METABOLIC PANEL
Anion gap: 7 (ref 5–15)
BUN: 13 mg/dL (ref 8–23)
CO2: 26 mmol/L (ref 22–32)
Calcium: 9.5 mg/dL (ref 8.9–10.3)
Chloride: 100 mmol/L (ref 98–111)
Creatinine, Ser: 1.05 mg/dL (ref 0.61–1.24)
GFR, Estimated: >60 mL/min (ref >60)
Glucose, Bld: 93 mg/dL (ref 70–99)
Potassium: 3.7 mmol/L (ref 3.5–5.1)
Sodium: 133 mmol/L — ABNORMAL LOW (ref 135–145)

## 2022-02-07 SURGERY — ABLATION, PROSTATE, RECTAL APPROACH, USING HIGH-INTENSITY FOCUSED ULTRASOUND
Anesthesia: General | Site: Prostate

## 2022-02-07 MED ORDER — OXYCODONE HCL 5 MG PO TABS
ORAL_TABLET | ORAL | Status: AC
  Administered 2022-02-07: 5 mg via ORAL
  Filled 2022-02-07: qty 1

## 2022-02-07 MED ORDER — GLYCOPYRROLATE 0.2 MG/ML IJ SOLN
INTRAMUSCULAR | Status: DC | PRN
  Administered 2022-02-07: .2 mg via INTRAVENOUS

## 2022-02-07 MED ORDER — FENTANYL CITRATE (PF) 100 MCG/2ML IJ SOLN: 25.0000 ug | INTRAMUSCULAR | Status: DC | PRN

## 2022-02-07 MED ORDER — CIPROFLOXACIN HCL 500 MG PO TABS: 500.0000 mg | ORAL_TABLET | Freq: Two times a day (BID) | ORAL | 0 refills | Status: DC

## 2022-02-07 MED ORDER — LEVOFLOXACIN IN D5W 500 MG/100ML IV SOLN
500.0000 mg | Freq: Once | INTRAVENOUS | Status: AC
  Administered 2022-02-07: 500 mg via INTRAVENOUS

## 2022-02-07 MED ORDER — ONDANSETRON HCL 4 MG/2ML IJ SOLN
INTRAMUSCULAR | Status: DC | PRN
  Administered 2022-02-07: 4 mg via INTRAVENOUS

## 2022-02-07 MED ORDER — STERILE WATER FOR INJECTION IJ SOLN
INTRAMUSCULAR | Status: DC | PRN
  Administered 2022-02-07: 1000 mL

## 2022-02-07 MED ORDER — PHENYLEPHRINE HCL (PRESSORS) 10 MG/ML IV SOLN
INTRAVENOUS | Status: AC
  Filled 2022-02-07: qty 1

## 2022-02-07 MED ORDER — LACTATED RINGERS IV SOLN: INTRAVENOUS | Status: DC

## 2022-02-07 MED ORDER — PHENYLEPHRINE HCL-NACL 20-0.9 MG/250ML-% IV SOLN
INTRAVENOUS | Status: DC | PRN
  Administered 2022-02-07: 25 ug/min via INTRAVENOUS

## 2022-02-07 MED ORDER — PROPOFOL 10 MG/ML IV BOLUS
INTRAVENOUS | Status: DC | PRN
  Administered 2022-02-07: 130 mg via INTRAVENOUS

## 2022-02-07 MED ORDER — SUGAMMADEX SODIUM 200 MG/2ML IV SOLN
INTRAVENOUS | Status: DC | PRN
  Administered 2022-02-07: 200 mg via INTRAVENOUS

## 2022-02-07 MED ORDER — CHLORHEXIDINE GLUCONATE 0.12 % MT SOLN: 15.0000 mL | Freq: Once | OROMUCOSAL | Status: AC

## 2022-02-07 MED ORDER — FLEET ENEMA 7-19 GM/118ML RE ENEM
1.0000 | ENEMA | Freq: Once | RECTAL | Status: AC
Start: 2022-02-07 — End: 2022-02-07
  Administered 2022-02-07: 1 via RECTAL

## 2022-02-07 MED ORDER — PHENYLEPHRINE HCL (PRESSORS) 10 MG/ML IV SOLN
INTRAVENOUS | Status: DC | PRN
  Administered 2022-02-07 (×5): 80 ug via INTRAVENOUS

## 2022-02-07 MED ORDER — OXYCODONE HCL 5 MG/5ML PO SOLN: 5.0000 mg | Freq: Once | ORAL | Status: AC | PRN

## 2022-02-07 MED ORDER — FENTANYL CITRATE (PF) 100 MCG/2ML IJ SOLN
INTRAMUSCULAR | Status: DC | PRN
  Administered 2022-02-07 (×2): 25 ug via INTRAVENOUS
  Administered 2022-02-07: 50 ug via INTRAVENOUS

## 2022-02-07 MED ORDER — LIDOCAINE HCL (CARDIAC) PF 100 MG/5ML IV SOSY
PREFILLED_SYRINGE | INTRAVENOUS | Status: DC | PRN
  Administered 2022-02-07: 80 mg via INTRAVENOUS

## 2022-02-07 MED ORDER — LEVOFLOXACIN IN D5W 500 MG/100ML IV SOLN
INTRAVENOUS | Status: AC
  Filled 2022-02-07: qty 100

## 2022-02-07 MED ORDER — CHLORHEXIDINE GLUCONATE 0.12 % MT SOLN
OROMUCOSAL | Status: AC
  Administered 2022-02-07: 15 mL via OROMUCOSAL
  Filled 2022-02-07: qty 15

## 2022-02-07 MED ORDER — OXYCODONE HCL 5 MG PO TABS: 5.0000 mg | ORAL_TABLET | Freq: Once | ORAL | Status: AC | PRN

## 2022-02-07 MED ORDER — ORAL CARE MOUTH RINSE: 15.0000 mL | Freq: Once | OROMUCOSAL | Status: AC

## 2022-02-07 MED ORDER — ROCURONIUM BROMIDE 100 MG/10ML IV SOLN
INTRAVENOUS | Status: DC | PRN
  Administered 2022-02-07 (×2): 20 mg via INTRAVENOUS
  Administered 2022-02-07: 60 mg via INTRAVENOUS
  Administered 2022-02-07: 20 mg via INTRAVENOUS

## 2022-02-07 MED ORDER — PROPOFOL 10 MG/ML IV BOLUS
INTRAVENOUS | Status: AC
  Filled 2022-02-07: qty 20

## 2022-02-07 MED ORDER — FENTANYL CITRATE (PF) 100 MCG/2ML IJ SOLN
INTRAMUSCULAR | Status: AC
  Filled 2022-02-07: qty 2

## 2022-02-07 MED ORDER — DOCUSATE SODIUM 100 MG PO CAPS: 200.0000 mg | ORAL_CAPSULE | Freq: Two times a day (BID) | ORAL | 3 refills | Status: DC

## 2022-02-07 SURGICAL SUPPLY — 15 items
FEE RENTAL SONABLATE (MISCELLANEOUS) ×1 IMPLANT
FEE SONABLATE RENTAL (MISCELLANEOUS) ×2 IMPLANT
FEE SONABLATE TECHNICIAN (MISCELLANEOUS) ×2 IMPLANT
FEE TECHNICIAN SONABLATE (MISCELLANEOUS) ×1 IMPLANT
GLOVE BIO SURGEON STRL SZ7.5 (GLOVE) ×2 IMPLANT
GOWN STRL REUS W/ TWL LRG LVL3 (GOWN DISPOSABLE) IMPLANT
GOWN STRL REUS W/ TWL XL LVL3 (GOWN DISPOSABLE) IMPLANT
GOWN STRL REUS W/TWL LRG LVL3 (GOWN DISPOSABLE)
GOWN STRL REUS W/TWL XL LVL3 (GOWN DISPOSABLE)
HOLDER FOLEY CATH W/STRAP (MISCELLANEOUS) ×2 IMPLANT
KIT TURNOVER KIT A (KITS) ×2 IMPLANT
PACK CYSTO AR (MISCELLANEOUS) ×2 IMPLANT
PACK PROSTATE SONABLATE INSERT (MISCELLANEOUS) ×2 IMPLANT
TRAY FOLEY MTR SLVR 16FR STAT (SET/KITS/TRAYS/PACK) ×2 IMPLANT
WATER STERILE IRR 1000ML POUR (IV SOLUTION) ×2 IMPLANT

## 2022-02-07 NOTE — Anesthesia Postprocedure Evaluation (Signed)
Anesthesia Post Note  Patient: Melvin Gilmore  Procedure(s) Performed: HIGH INTENSITY FOCUSED ULTRASOUND (HIFU) OF THE PROSTATE (Prostate)  Patient location during evaluation: PACU Anesthesia Type: Combined General/Spinal Level of consciousness: awake and alert Pain management: pain level controlled Vital Signs Assessment: post-procedure vital signs reviewed and stable Respiratory status: spontaneous breathing, nonlabored ventilation, respiratory function stable and patient connected to nasal cannula oxygen Cardiovascular status: blood pressure returned to baseline and stable Postop Assessment: no apparent nausea or vomiting Anesthetic complications: no   No notable events documented.   Last Vitals:  Vitals:   02/07/22 1027 02/07/22 1042  BP: 133/71 127/73  Pulse: 60 (!) 59  Resp: 20 18  Temp: 36.4 C (!) 36.1 C  SpO2: 100% 99%    Last Pain:  Vitals:   02/07/22 1050  TempSrc:   PainSc: [place]

## 2022-02-07 NOTE — Anesthesia Preprocedure Evaluation (Addendum)
Anesthesia Evaluation  Patient identified by MRN, date of birth, ID band Patient awake    Reviewed: Allergy & Precautions, NPO status , Patient's Chart, lab work & pertinent test results  Airway Mallampati: III  TM Distance: >3 FB Neck ROM: full    Dental  (+) Teeth Intact   Pulmonary neg pulmonary ROS,    Pulmonary exam normal        Cardiovascular hypertension, negative cardio ROS Normal cardiovascular exam     Neuro/Psych negative neurological ROS  negative psych ROS   GI/Hepatic Neg liver ROS, GERD  ,  Endo/Other  negative endocrine ROS  Renal/GU      Musculoskeletal   Abdominal   Peds  Hematology negative hematology ROS (+)   Anesthesia Other Findings Past Medical History: No date: Anxiety     Comment:  a.) on BZO (alprazolam) PRN No date: Cervicalgia No date: Chest pain, non-cardiac No date: Elevated PSA 08/03/2021: Esophageal rupture     Comment:  Distal with moderate free air surrounding Hiatal Hernia No date: Gallbladder polyp No date: Gastritis No date: GERD (gastroesophageal reflux disease) No date: History of hiatal hernia No date: HLD (hyperlipidemia) No date: HTN (hypertension) No date: Hypothyroidism No date: Meniere disease     Comment:  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 No date: Seasonal allergies No date: Skin cancer     Comment:  a.) ears No date: Wears partial dentures     Comment:  lower  Past Surgical History: No date: APPENDECTOMY 01/15/2016: CATARACT EXTRACTION W/PHACO; Right     Comment:  Procedure: CATARACT EXTRACTION PHACO AND INTRAOCULAR               LENS PLACEMENT ([place]);  Surgeon: Ronnell Freshwater,              MD;  Location: [place];  Service:               Ophthalmology;  Laterality: Right;  RIGHT CALL CELL               PHONE WITH TIME No date: CHOLECYSTECTOMY No date: COLONOSCOPY No date: COLONOSCOPY WITH  ESOPHAGOGASTRODUODENOSCOPY (EGD) 07/2021: ESOPHAGOGASTRODUODENOSCOPY 09/23/2017: ESOPHAGOGASTRODUODENOSCOPY (EGD) WITH PROPOFOL; N/A     Comment:  Procedure: ESOPHAGOGASTRODUODENOSCOPY (EGD) WITH               PROPOFOL;  Surgeon: Toledo, [name], MD;  Location:               [facility];  Service: Gastroenterology;  Laterality:               N/A; No date: HYDROCELE EXCISION / REPAIR No date: LASIX RT EYE 01/03/2022: PROSTATE BIOPSY; N/A     Comment:  Procedure: PROSTATE BIOPSY  URONAV;  Surgeon: Royston Cowper, MD;  Location: [facility];  Service: Urology;                Laterality: N/A; 2001: [place]; Left  BMI    Body Mass Index: 19.53 kg/m      Reproductive/Obstetrics negative OB ROS                             Anesthesia Physical Anesthesia Plan  ASA: 2  Anesthesia Plan: General/Spinal   Post-op Pain Management:    Induction: Intravenous  PONV Risk Score and Plan: 2  and Ondansetron, Dexamethasone, Midazolam and Treatment may vary due to age or medical condition  Airway Management Planned: LMA  Additional Equipment:   Intra-op Plan:   Post-operative Plan: Extubation in OR  Informed Consent: I have reviewed the patients History and Physical, chart, labs and discussed the procedure including the risks, benefits and alternatives for the proposed anesthesia with the patient or authorized representative who has indicated his/her understanding and acceptance.     Dental Advisory Given  Plan Discussed with: Anesthesiologist, CRNA and Surgeon  Anesthesia Plan Comments: (Patient consented for risks of anesthesia including but not limited to:  - adverse reactions to medications - damage to eyes, teeth, lips or other oral mucosa - nerve damage due to positioning  - sore throat or hoarseness - Damage to heart, brain, nerves, lungs, other parts of body or loss of life  Patient voiced understanding.)        Anesthesia Quick Evaluation

## 2022-02-07 NOTE — Transfer of Care (Signed)
Immediate Anesthesia Transfer of Care Note  Patient: Melvin Gilmore  Procedure(s) Performed: HIGH INTENSITY FOCUSED ULTRASOUND (HIFU) OF THE PROSTATE (Prostate)  Patient Location: PACU  Anesthesia Type:General  Level of Consciousness: drowsy  Airway & Oxygen Therapy: Patient Spontanous Breathing and Patient connected to face mask oxygen  Post-op Assessment: Report given to RN  Post vital signs: stable  Last Vitals:  Vitals Value Taken Time  BP 123/72 02/07/22 0957  Temp    Pulse 65 02/07/22 0958  Resp 13 02/07/22 0958  SpO2 100 % 02/07/22 0958  Vitals shown include unvalidated device data.  Last Pain:  Vitals:   02/07/22 0628  TempSrc: Oral  PainSc: 0-No pain         Complications: No notable events documented.

## 2022-02-07 NOTE — Discharge Instructions (Addendum)
Indwelling Urinary Catheter Care, Adult An indwelling urinary catheter is a thin tube that is put into your bladder. The tube helps to drain pee (urine) out of your body. The tube goes in through your urethra. Your urethra is where pee comes out of your body. Your pee will come out through the catheter, then it will go into a bag (drainage bag). Take good care of your catheter so it will work well. What are the risks? Germs may get into your bladder and cause an infection. The tube can become blocked. Tissue near the catheter may become irritated and may bleed. How to wear your catheter and drainage bag Supplies needed Sticky tape (adhesive tape) or a leg strap. Alcohol wipe or soap and water (if you use tape). A clean towel (if you use tape). Large overnight bag. Smaller bag (leg bag). Wearing your catheter Attach your catheter to your leg with tape or a leg strap. Make sure the catheter is not pulled tight. If a leg strap gets wet, take it off and put on a dry strap. If you use tape to hold the bag on your leg: Use an alcohol wipe or soap and water to wash your skin where the tape made it sticky before. Use a clean towel to pat-dry that skin. Use new tape to make the bag stay on your leg. Wearing your bags You should have been given a large overnight bag. You may wear the overnight bag in the day or night. Always have the overnight bag lower than your bladder.  Do not let the bag touch the floor. Before you go to sleep, put a clean plastic bag in a wastebasket. Then, hang the overnight bag inside the wastebasket. You should also have a smaller leg bag that fits under your clothes. Wear the leg bag as told by the product maker. This may be above or below the knee, depending on the length of the tubing. Make sure that the leg bag is below the bladder. Make sure that the tubing does not have loops or too much tension. Do not wear your leg bag at night. How to care for your skin and  catheter Supplies needed A clean washcloth. Water and mild soap. A clean towel. Caring for your skin and catheter Male anatomy showing the labia, urethra, and an indwelling urinary catheter in the bladder.     Male anatomy showing the penis, urethra, and an indwelling urinary catheter in the bladder.   Clean the skin around your catheter every day. Wash your hands with soap and water. Wet a clean washcloth in warm water and mild soap. Clean the skin around your urethra. If you are male: Gently spread the folds of skin around your vagina (labia). With the washcloth in your other hand, wipe the inner side of your labia on each side. Wipe from front to back. If you are male: Pull back any skin that covers the end of your penis (foreskin). With the washcloth in your other hand, wipe your penis in small circles. Start wiping at the tip of your penis, then move away from the catheter. Move the foreskin back in place, if needed. With your free hand, hold the catheter close to where it goes into your body. Keep holding the catheter during cleaning so it does not get pulled out. With the washcloth in your other hand, clean the catheter. Only wipe downward on the catheter, toward the drainage bag. Do not wipe upward toward your body. Doing this  may push germs into your urethra and cause infection. Use a clean towel to pat-dry the catheter and the skin around it. Make sure to wipe off all soap. Wash your hands with soap and water. Shower every day. Do not take baths. Do not use cream, ointment, or lotion on the area where the catheter goes into your body, unless your doctor tells you to. Do not use powders, sprays, or lotions on your genital area. Check your skin around the catheter every day for signs of infection. Check for: Redness, swelling, or pain. Fluid or blood. Warmth. Pus or a bad smell. How to empty the bag Supplies needed Rubbing alcohol. Gauze pad or cotton ball. Tape  or a leg strap. Emptying the bag Pour the pee out of your bag when it is ?- full, or at least 2-3 times a day. Do this for your overnight bag and your leg bag. Wash your hands with soap and water. Separate (detach) the bag from your leg. Hold the bag over the toilet or a clean pail. Keep the bag lower than your hips and bladder. This is so the pee (urine) does not go back into the tube. Open the pour spout. It is at the bottom of the bag. Empty the pee into the toilet or pail. Do not let the pour spout touch any surface. Put rubbing alcohol on a gauze pad or cotton ball. Use the gauze pad or cotton ball to clean the pour spout. Close the pour spout. Attach the bag to your leg with tape or a leg strap. Wash your hands with soap and water. Follow instructions for cleaning the drainage bag. Instructions can come from: The product maker. Your doctor. How to change the bag Changing the bag Replace your bag when it starts to leak, smell bad, or look dirty. Wash your hands with soap and water. Separate the dirty bag from your leg. Pinch the catheter with your fingers so that pee does not spill out. Separate the catheter tube from the bag tube where these tubes connect (at the connection valve). Do not let the tubes touch any surface. Clean the end of the catheter tube with an alcohol wipe. Use a different alcohol wipe to clean the end of the bag tube. Connect the catheter tube to the tube of the clean bag. Attach the clean bag to your leg with tape or a leg strap. Do not make the bag tight on your leg. Wash your hands with soap and water. General instructions A person washing hands with soap and water.   Never pull on your catheter. Never try to take it out. Doing that can hurt you. Always wash your hands before and after you touch your catheter or bag. Use a mild, fragrance-free soap. If you do not have soap and water, use hand sanitizer. Always make sure there are no twists, bends, or  kinks in the catheter tube. Always make sure there are no leaks in the catheter or bag. Drink enough fluid to keep your pee pale yellow. Do not take baths, swim, or use a hot tub. If you are male, wipe from front to back after you poop (have a bowel movement). Contact a doctor if: Your catheter gets clogged. Your catheter leaks. You have signs of infection at the catheter site, such as: Redness, swelling, or pain where the catheter goes into your body. Fluid, blood, pus, or a bad smell coming from the area where the catheter goes into your body. Skin feels  warm where the catheter goes into your body. You have signs of a bladder infection, such as: Fever. Chills. Pee smells worse than usual. Cloudy pee. Pain in your belly, legs, lower back, or bladder. Vomiting or feel like vomiting. Get help right away if: You see blood in the catheter. Your pee is pink or red. Your bladder feels full. Your pee is not draining into the bag. Your catheter gets pulled out. Summary An indwelling urinary catheter is a thin tube that is placed into the bladder to help drain pee (urine) out of the body. The catheter is placed into the part of the body that drains pee from the bladder (urethra). Taking good care of your catheter will keep it working well. Always wash your hands before and after touching your catheter or bag. Never pull on your catheter or try to take it out. This information is not intended to replace advice given to you by your health care provider. Make sure you discuss any questions you have with your health care provider. Document Revised: 03/08/2021 Document Reviewed: 03/08/2021 Elsevier Patient Education  [employer]   The drugs that you were given will stay in your system until tomorrow so for the next 24 hours you should not:  Drive an automobile Make any legal decisions Drink any alcoholic beverage   You may  resume regular meals tomorrow.  Today it is better to start with liquids and gradually work up to solid foods.  You may eat anything you prefer, but it is better to start with liquids, then soup and crackers, and gradually work up to solid foods.   Please notify your doctor immediately if you have any unusual bleeding, trouble breathing, redness and pain at the surgery site, drainage, fever, or pain not relieved by medication.      Additional Instructions:

## 2022-02-07 NOTE — H&P (Signed)
Date of Initial H&P: 01/25/22  History reviewed, patient examined, no change in status, stable for surgery.

## 2022-02-07 NOTE — Op Note (Signed)
Preoperative diagnosis: Prostate cancer (C61)  Postoperative diagnosis: Same  Procedure: High intensity focused ultrasound (HIFU)treatment of the prostate (CPT [phone])  Surgeon: Otelia Limes. Yves Dill MD  Anesthesia: General  Indications:See the history and physical also. 81 year old ([place]: 05-Aug-1940) white male with elevated PSA of 8.5 ng/mL that was evaluated with MRI scan in 11/2021.  The MRI scan indicated a 35.8 mL prostate with a 1 mL category 5 lesion of the anterior transition zone.  He underwent fusion biopsy revealing a Gleason's grade 3+4 adenocarcinoma involving the region of interest and all the other systematic core biopsies were negative.  He had genomic testing with Oncotype DX GPS score of 20, which indicates a 31% likelihood of adverse pathology at the time of radical prostatectomy.  He has chosen HIFU as his preferred treatment option and comes in now for HIFU treatment.After informed consent the above procedure was requested.     Technique and findings:The patient, identified by his name bracelet, was taken to the procedure room. After a standard timeout was performed, the staff that were present agreed on the procedure to be performed. The patient was anesthetized, prepped and draped in the usual manner. A urethral catheter was placed. The patient was placed in the lithotomy position with padded stirrups and anti-embolism compression devices applied. The trans-rectal Sonablate probe was carefully inserted and the prostate was measured with the dimensions noted below. The dimensions of the prostate were: H __2.85____ cm W __4.28____ cm L __3.83___ cm, for a calculated volume of ____24.27_______cc. Images in both transverse and sagittal views were obtained. The urethral catheter was not removed. A treatment plan was programmed.  The urethra and neurovascular bundles were spared. The power levels were selected based on approximate rectal wall distance as outlined in the Physician  Instruction Manual. The temperature of the rectal probe and the reflectivity index were maintained within normal range at all times. The targeted area of the prostate gland was treated in its entirety. The procedure was recorded on the [employer]. The patient was transferred to the recovery room in stable condition.

## 2022-02-07 NOTE — Anesthesia Procedure Notes (Signed)
Procedure Name: Intubation Date/Time: 02/07/2022 7:34 AM  Performed by: Lerry Liner, CRNAPre-anesthesia Checklist: Patient identified, Emergency Drugs available, Suction available and Patient being monitored Patient Re-evaluated:Patient Re-evaluated prior to induction Oxygen Delivery Method: Circle system utilized Preoxygenation: Pre-oxygenation with 100% oxygen Induction Type: IV induction Ventilation: Mask ventilation without difficulty Laryngoscope Size: McGraph and 4 Grade View: Grade I Tube type: Oral Tube size: 7.5 mm Number of attempts: 1 Airway Equipment and Method: Stylet and Oral airway Placement Confirmation: ETT inserted through vocal cords under direct vision, positive ETCO2 and breath sounds checked- equal and bilateral Secured at: 22 cm Tube secured with: Tape Dental Injury: Teeth and Oropharynx as per pre-operative assessment

## 2022-02-08 ENCOUNTER — Encounter: Payer: Self-pay | Admitting: Urology

## 2022-07-09 ENCOUNTER — Other Ambulatory Visit: Payer: Self-pay | Admitting: Internal Medicine

## 2022-07-09 DIAGNOSIS — I10 Essential (primary) hypertension: Secondary | ICD-10-CM

## 2022-07-09 DIAGNOSIS — R1084 Generalized abdominal pain: Secondary | ICD-10-CM

## 2022-07-17 ENCOUNTER — Ambulatory Visit
Admission: RE | Admit: 2022-07-17 | Discharge: 2022-07-17 | Disposition: A | Discharge status: Home / Self Care | Payer: Medicare Other | Source: Ambulatory Visit | Attending: Internal Medicine | Admitting: Internal Medicine

## 2022-07-17 ENCOUNTER — Ambulatory Visit: Payer: Medicare Other

## 2022-07-17 DIAGNOSIS — I10 Essential (primary) hypertension: Secondary | ICD-10-CM | POA: Diagnosis present

## 2022-07-17 DIAGNOSIS — R1084 Generalized abdominal pain: Secondary | ICD-10-CM | POA: Diagnosis present

## 2022-07-23 ENCOUNTER — Ambulatory Visit: Payer: Medicare Other

## 2022-08-21 ENCOUNTER — Other Ambulatory Visit: Payer: Self-pay

## 2022-08-21 ENCOUNTER — Emergency Department: Payer: Medicare HMO

## 2022-08-21 ENCOUNTER — Emergency Department
Admission: EM | Admit: 2022-08-21 | Discharge: 2022-08-21 | Disposition: A | Discharge status: Home / Self Care | Payer: Medicare HMO | Attending: Student in an Organized Health Care Education/Training Program | Admitting: Student in an Organized Health Care Education/Training Program

## 2022-08-21 DIAGNOSIS — R519 Headache, unspecified: Secondary | ICD-10-CM | POA: Insufficient documentation

## 2022-08-21 DIAGNOSIS — I10 Essential (primary) hypertension: Secondary | ICD-10-CM | POA: Diagnosis not present

## 2022-08-21 NOTE — ED Triage Notes (Signed)
C/O left sided headache x 3 days.  Denies trauma or LOC.  Patient sent from [facility] for ED evaluation.    AAOx3.  Skin warm and dry. NAD.

## 2022-08-21 NOTE — Discharge Instructions (Signed)

## 2022-08-21 NOTE — ED Triage Notes (Signed)
Pt Comes with c/o headache. Pt states no pain at moment. Pt states earlier it is shooting pain that is about 8. Pt states this started 3 days ago. Pt states it has been consistent. Pt denies any dizziness, blurry vision, cp or sob. Pt states unsure if it is his sinuses. He has been taking meds for that.

## 2022-08-21 NOTE — ED Provider Notes (Signed)
[facility] Provider Note    Event Date/Time   First MD Initiated Contact with Patient 08/21/22 1656     (approximate)   History   Headache   HPI  Melvin Gilmore is a 82 y.o. male with a history of GERD HLD hypertension Mnire's, sinus disease presents to the ER for evaluation of headache that he had for a few days starting 3 days ago and says it was left frontal somewhat consistent with previous episodes of sinusitis that he has had but was longer in duration.  States that the headache has resolved but he was concerned as it was slightly different from previous episodes and wanted to be checked out.  He denies any fevers no neck stiffness.  No numbness or tingling.  No blurry vision.  No nausea or vomiting.  No new medications.  No trauma no falls.  Not the worst headache of his life.  No temporal pain.  Not throbbing in nature.     Physical Exam   Triage Vital Signs: ED Triage Vitals  Enc Vitals Group     BP 08/21/22 1530 123/72     Pulse Rate 08/21/22 1530 66     Resp 08/21/22 1530 16     Temp 08/21/22 1530 98.8 F (37.1 C)     Temp Source 08/21/22 1530 Oral     SpO2 08/21/22 1530 99 %     Weight 08/21/22 1530 150 lb (68 kg)     Height 08/21/22 1530 '5\' 11"'$  (1.803 m)     Head Circumference --      Peak Flow --      Pain Score 08/21/22 1537 0     Pain Loc --      Pain Edu? --      Excl. in [place]? --     Most recent vital signs: Vitals:   08/21/22 1530 08/21/22 1708  BP: 123/72 (!) 143/82  Pulse: 66 71  Resp: 16 16  Temp: 98.8 F (37.1 C)   SpO2: 99% 99%     Constitutional: Alert  Eyes: Conjunctivae are normal.  Head: Atraumatic. Nose: No congestion/rhinnorhea. Mouth/Throat: Mucous membranes are moist.   Neck: Painless ROM.  Cardiovascular:   Good peripheral circulation. Respiratory: Normal respiratory effort.  No retractions.  Gastrointestinal: Soft and nontender.  Musculoskeletal:  no deformity Neurologic:  CN- intact.  No  facial droop, Normal FNF.  Normal heel to shin.  Sensation intact bilaterally. Normal speech and language. No gross focal neurologic deficits are appreciated. No gait instability. Skin:  Skin is warm, dry and intact. No rash noted. Psychiatric: Mood and affect are normal. Speech and behavior are normal.    ED Results / Procedures / Treatments   Labs (all labs ordered are listed, but only abnormal results are displayed) Labs Reviewed - No data to display   EKG     RADIOLOGY Please see ED Course for my review and interpretation.  I personally reviewed all radiographic images ordered to evaluate for the above acute complaints and reviewed radiology reports and findings.  These findings were personally discussed with the patient.  Please see medical record for radiology report.    PROCEDURES:  Critical Care performed: No  Procedures   MEDICATIONS ORDERED IN ED: Medications - No data to display   IMPRESSION / MDM / [place] / ED COURSE  I reviewed the triage vital signs and the nursing notes.  Differential diagnosis includes, but is not limited to, tension, cluster, GCA, SAH, aneurysm, SDH, CVA, migraine, glaucoma  Patient presented to the ER for evaluation of headache that he had yesterday.  He is clinically very well-appearing in no acute distress.  Not hypertensive no fever.  No meningismus.  His neuroexam is nonfocal and reassuring.  CT imaging ordered triage on my review and interpretation without any SDH or IPH.  Per radiology no acute findings.  His symptoms or not consistent with acute CVA.  Is not consistent with aneurysm or SAH.  No temporal tenderness to palpation not consistent with GCA.  He is pain-free and asymptomatic at this time.  Felt even better after additional reassurance.  Discussed option for further testing including laboratory evaluation patient declining this.  Given his well appearance and as he is asymptomatic  with reassuring exam do believe that outpatient follow-up is reasonable we discussed signs and symptoms which she should return to the ER.      FINAL CLINICAL IMPRESSION(S) / ED DIAGNOSES   Final diagnoses:  Nonintractable headache, unspecified chronicity pattern, unspecified headache type     Rx / DC Orders   ED Discharge Orders     None        Note:  This document was prepared using Dragon voice recognition software and may include unintentional dictation errors.    Merlyn Lot, MD 08/21/22 1710

## 2022-11-15 ENCOUNTER — Ambulatory Visit
Admission: RE | Admit: 2022-11-15 | Discharge: 2022-11-15 | Disposition: A | Discharge status: Home / Self Care | Payer: Medicare HMO | Source: Ambulatory Visit | Attending: Physician Assistant | Admitting: Physician Assistant

## 2022-11-15 ENCOUNTER — Other Ambulatory Visit: Payer: Self-pay | Admitting: Physician Assistant

## 2022-11-15 DIAGNOSIS — S22080G Wedge compression fracture of T11-T12 vertebra, subsequent encounter for fracture with delayed healing: Secondary | ICD-10-CM | POA: Insufficient documentation

## 2022-11-21 ENCOUNTER — Encounter: Payer: Self-pay | Admitting: Internal Medicine

## 2022-11-21 ENCOUNTER — Telehealth: Payer: Self-pay

## 2022-11-21 ENCOUNTER — Inpatient Hospital Stay: Payer: Medicare HMO | Attending: Internal Medicine | Admitting: Internal Medicine

## 2022-11-21 ENCOUNTER — Other Ambulatory Visit: Payer: Self-pay | Admitting: Internal Medicine

## 2022-11-21 ENCOUNTER — Inpatient Hospital Stay: Payer: Medicare HMO

## 2022-11-21 ENCOUNTER — Ambulatory Visit
Admission: RE | Admit: 2022-11-21 | Discharge: 2022-11-21 | Disposition: A | Discharge status: Home / Self Care | Payer: Medicare HMO | Source: Ambulatory Visit | Attending: Internal Medicine | Admitting: Internal Medicine

## 2022-11-21 VITALS — BP 138/71 | HR 70 | Temp 97.5°F | Resp 17 | Wt 145.0 lb

## 2022-11-21 DIAGNOSIS — G893 Neoplasm related pain (acute) (chronic): Secondary | ICD-10-CM | POA: Insufficient documentation

## 2022-11-21 DIAGNOSIS — E785 Hyperlipidemia, unspecified: Secondary | ICD-10-CM | POA: Diagnosis not present

## 2022-11-21 DIAGNOSIS — M4854XA Collapsed vertebra, not elsewhere classified, thoracic region, initial encounter for fracture: Secondary | ICD-10-CM | POA: Diagnosis not present

## 2022-11-21 DIAGNOSIS — E039 Hypothyroidism, unspecified: Secondary | ICD-10-CM | POA: Diagnosis not present

## 2022-11-21 DIAGNOSIS — C9000 Multiple myeloma not having achieved remission: Secondary | ICD-10-CM | POA: Insufficient documentation

## 2022-11-21 DIAGNOSIS — D649 Anemia, unspecified: Secondary | ICD-10-CM | POA: Diagnosis not present

## 2022-11-21 DIAGNOSIS — M545 Low back pain, unspecified: Secondary | ICD-10-CM | POA: Diagnosis not present

## 2022-11-21 DIAGNOSIS — Z515 Encounter for palliative care: Secondary | ICD-10-CM | POA: Diagnosis not present

## 2022-11-21 DIAGNOSIS — Z8546 Personal history of malignant neoplasm of prostate: Secondary | ICD-10-CM | POA: Insufficient documentation

## 2022-11-21 DIAGNOSIS — C61 Malignant neoplasm of prostate: Secondary | ICD-10-CM | POA: Diagnosis not present

## 2022-11-21 DIAGNOSIS — K5900 Constipation, unspecified: Secondary | ICD-10-CM | POA: Diagnosis not present

## 2022-11-21 DIAGNOSIS — F419 Anxiety disorder, unspecified: Secondary | ICD-10-CM | POA: Insufficient documentation

## 2022-11-21 DIAGNOSIS — M899 Disorder of bone, unspecified: Secondary | ICD-10-CM | POA: Diagnosis not present

## 2022-11-21 DIAGNOSIS — R937 Abnormal findings on diagnostic imaging of other parts of musculoskeletal system: Secondary | ICD-10-CM

## 2022-11-21 DIAGNOSIS — S22080A Wedge compression fracture of T11-T12 vertebra, initial encounter for closed fracture: Secondary | ICD-10-CM | POA: Insufficient documentation

## 2022-11-21 DIAGNOSIS — K219 Gastro-esophageal reflux disease without esophagitis: Secondary | ICD-10-CM | POA: Insufficient documentation

## 2022-11-21 DIAGNOSIS — S22080G Wedge compression fracture of T11-T12 vertebra, subsequent encounter for fracture with delayed healing: Secondary | ICD-10-CM

## 2022-11-21 HISTORY — PX: IR RADIOLOGIST EVAL & MGMT: IMG5224

## 2022-11-21 LAB — IRON AND TIBC
Iron: 84 ug/dL (ref 45–182)
Saturation Ratios: 28 % (ref 17.9–39.5)
TIBC: 301 ug/dL (ref 250–450)
UIBC: 217 ug/dL

## 2022-11-21 LAB — FERRITIN: Ferritin: 165 ng/mL (ref 24–336)

## 2022-11-21 NOTE — Progress Notes (Signed)
[facility] CONSULT NOTE  Patient Care Team: [name], MD as PCP - General (Internal Medicine)  REFERRING PROVIDER: Ignacia Bayley, PA  REASON FOR REFFERAL: Abnormal T-spine MRI  CANCER STAGING   Cancer Staging  No matching staging information was found for the patient.  ASSESSMENT & PLAN:  Melvin Gilmore 82 y.o. male with pmh of hypertension, hyperlipidemia, anxiety, GERD, BPH, hypothyroidism, prostate cancer s/p HIFU was seen by primary on November 13, 2022 for acute onset of lower back pain.  Was referred to medical oncology for finding of L1 lesion suspicious for metastasis.  # Compression fracture of T12 # L1 lesion # Lower back pain from above -Patient developed acute back pain after a heavy sneeze. MRI thoracic spine without contrast showed compression fracture of T12 superior endplate with surrounding marrow edema likely subacute.  15 mm T1 hypointense marrow lesion in the anterior aspect of L1 suspicious for metastasis.  Further characterization with postcontrast MRI recommended.  -I discussed with the patient and wife about the MRI spine finding.  He has nagging pain from the compression fracture which is affecting his quality of life.  He spends more time resting.  I will make referral to interventional radiology to evaluate for potential kyphoplasty.  Also there was a finding of 15 mm T1 hypointense lesion in the anterior aspect of L1.  Cannot rule out metastasis.  Will order MRI thoracic and lumbar spine with and without contrast for better characterization.  If postcontrast scans are still looking concerning for L1 lesion, I will coordinate with IR if biopsy can be done at the same time as kyphoplasty.  -Will obtain myeloma panel to rule out plasma cell dyscrasia.  -He is taking combination of Tylenol and ibuprofen 1 tablet a day.  Was prescribed hydrocodone by his primary but worsened his constipation so does not like to take it.  Reports pain may have  mildly improved but still he is not able to do much.   # History of prostate cancer - follows with Dr. Evelene Croon, urology for elevated PSA.  MRI prostate showed 1 mm category 5 lesion of anterior transition zone.  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 s/p HIFU of the prostate on 02/07/2022.  PSA level from 10/08/2022 was 3.34.  -Repeat PSA level today.  # Normocytic anemia -Chronic, hemoglobin 13.4.  Will obtain iron panel and B12.  Creatinine normal.     Orders Placed This Encounter  Procedures   MR Thoracic Spine W Wo Contrast    Standing Status:   Future    Standing Expiration Date:   11/21/2023    Order Specific Question:   GRA to provide read?    Answer:   Yes    Order Specific Question:   If indicated for the ordered procedure, I authorize the administration of contrast media per Radiology protocol    Answer:   Yes    Order Specific Question:   What is the patient's sedation requirement?    Answer:   No Sedation    Order Specific Question:   Use SRS Protocol?    Answer:   Yes    Order Specific Question:   Does the patient have a pacemaker or implanted devices?    Answer:   No    Order Specific Question:   Preferred imaging location?    Answer:   [facility] (table limit - 550lbs)   MR Lumbar Spine W Wo Contrast  Standing Status:   Future    Standing Expiration Date:   11/21/2023    Order Specific Question:   If indicated for the ordered procedure, I authorize the administration of contrast media per Radiology protocol    Answer:   Yes    Order Specific Question:   What is the patient's sedation requirement?    Answer:   No Sedation    Order Specific Question:   Does the patient have a pacemaker or implanted devices?    Answer:   No    Order Specific Question:   Use SRS Protocol?    Answer:   Yes    Order Specific Question:   Preferred imaging location?    Answer:   [facility] (table limit - 550lbs)    PSA    Standing Status:   Future    Number of Occurrences:   1    Standing Expiration Date:   11/21/2023   Multiple Myeloma Panel (SPEP&IFE w/QIG)    Standing Status:   Future    Number of Occurrences:   1    Standing Expiration Date:   11/21/2023   Kappa/lambda light chains    Standing Status:   Future    Number of Occurrences:   1    Standing Expiration Date:   11/21/2023   Iron and TIBC    Standing Status:   Future    Number of Occurrences:   1    Standing Expiration Date:   11/21/2023   Ferritin    Standing Status:   Future    Number of Occurrences:   1    Standing Expiration Date:   11/21/2023   Vitamin B12    Standing Status:   Future    Number of Occurrences:   1    Standing Expiration Date:   11/21/2023   Immunofixation, urine    Standing Status:   Future    Number of Occurrences:   1    Standing Expiration Date:   11/21/2023   Ambulatory referral to Interventional Radiology    Referral Priority:   Urgent    Referral Type:   Consultation    Referral Reason:   Specialty Services Required    Requested Specialty:   Interventional Radiology    Number of Visits Requested:   1    The total time spent in the appointment was 45 minutes encounter with patients including review of chart and various tests results, discussions about plan of care and coordination of care plan   All questions were answered. The patient knows to call the clinic with any problems, questions or concerns. No barriers to learning was detected.  [name], MD 5/2/202412:56 PM   HISTORY OF PRESENTING ILLNESS:  Melvin Gilmore 82 y.o. male with past medical history of hypertension, hyperlipidemia, anxiety, GERD, BPH, hypothyroidism, prostate cancer s/p HIFU was seen by primary on November 13, 2022 for acute onset of lower back pain.  Patient reports that he was sitting outside and had violent sneeze which was followed by severe back pain.  He has fallen allergies.  Had x-rays followed by MRI thoracic spine  without contrast.  It showed compression fracture of T12 superior endplate with surrounding marrow edema likely subacute.  15 mm T1 hypointense marrow lesion in the anterior aspect of L1 suspicious for metastasis.  Further characterization with postcontrast MRI recommended.  Prostate cancer-follows with Dr. Evelene Croon, urology for elevated PSA.  MRI prostate showed 1 mm category 5 lesion of anterior  transition zone.  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 s/p HIFU of the prostate on 02/07/2022.  PSA level from 10/08/2022 was 3.34.  I have reviewed his chart and materials related to his cancer extensively and collaborated history with the patient. Summary of oncologic history is as follows: Oncology History   No history exists.    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   Prostate cancer (HCC) 2023   Seasonal allergies    Skin cancer    a.) ears   Wears partial dentures    lower    SURGICAL HISTORY: Past Surgical History:  Procedure Laterality Date   APPENDECTOMY     CATARACT EXTRACTION W/PHACO Right 01/15/2016   Procedure: CATARACT EXTRACTION PHACO AND INTRAOCULAR LENS PLACEMENT (IOC);  Surgeon: Sherald Hess, MD;  Location: [facility];  Service: Ophthalmology;  Laterality: Right;  RIGHT CALL CELL PHONE WITH TIME   CHOLECYSTECTOMY     COLONOSCOPY     COLONOSCOPY WITH ESOPHAGOGASTRODUODENOSCOPY (EGD)     ESOPHAGOGASTRODUODENOSCOPY  07/2021    ESOPHAGOGASTRODUODENOSCOPY (EGD) WITH PROPOFOL N/A 09/23/2017   Procedure: ESOPHAGOGASTRODUODENOSCOPY (EGD) WITH PROPOFOL;  Surgeon: Toledo, Boykin Nearing, MD;  Location: [facility];  Service: Gastroenterology;  Laterality: N/A;   HIGH INTENSITY FOCUSED ULTRASOUND (HIFU) OF THE PROSTATE N/A 02/07/2022   Procedure: HIGH INTENSITY FOCUSED ULTRASOUND (HIFU) OF THE PROSTATE;  Surgeon: Orson Ape, MD;  Location: [facility] OR[facility];  Service: Urology;  Laterality: N/A;   HYDROCELE EXCISION / REPAIR     LASIX RT EYE     PROSTATE BIOPSY N/A 01/03/2022   Procedure: PROSTATE BIOPSY  URONAV;  Surgeon: Orson Ape, MD;  Location: [facility] ORS;  Service: Urology;  Laterality: N/A;   ROTATOR CUFF REPAIR Left 2001    SOCIAL HISTORY: Social History   Socioeconomic History   Marital status: Married    Spouse name: Not on file   Number of children: Not on file   Years of education: Not on file   Highest education level: Not on file  Occupational History   Not on file  Tobacco Use   Smoking status: Never   Smokeless tobacco: Never  Vaping Use   Vaping Use: Never used  Substance and Sexual Activity   Alcohol use: Yes    Alcohol/week: 7.0 standard drinks of alcohol    Types: 7 Glasses of wine per week    Comment: wine occ   Drug use: No   Sexual activity: Not on file  Other Topics Concern   Not on file  Social History Narrative   Not on file   Social Determinants of Health   Financial Resource Strain: Low Risk  (11/21/2022)   Overall Financial Resource Strain (CARDIA)    Difficulty of Paying Living Expenses: Not hard at all  Food Insecurity: No Food Insecurity (11/21/2022)   Hunger Vital Sign    Worried About Running Out of Food  in the Last Year: Never true    Ran Out of Food in the Last Year: Never true  Transportation Needs: Not on file  Physical Activity: Not on file  Stress: Not on file  Social Connections: Not on file  Intimate Partner Violence: Not At Risk (11/21/2022)   Humiliation,  Afraid, Rape, and Kick questionnaire    Fear of Current or Ex-Partner: No    Emotionally Abused: No    Physically Abused: No    Sexually Abused: No    FAMILY HISTORY: Family History  Problem Relation Age of Onset   Pneumonia Father     ALLERGIES:  is allergic to amoxicillin-pot clavulanate and meloxicam.  MEDICATIONS:  Current Outpatient Medications  Medication Sig Dispense Refill   acetaminophen (TYLENOL) 500 MG tablet Take 500 mg by mouth every 6 (six) hours as needed.     ALPRAZolam (XANAX) 0.5 MG tablet Take 0.25-0.5 mg by mouth at bedtime as needed for anxiety.     cetirizine (ZYRTEC) 10 MG tablet Take 10 mg by mouth as needed for allergies.     docusate sodium (COLACE) 100 MG capsule Take 2 capsules (200 mg total) by mouth 2 (two) times daily. 120 capsule 3   fluticasone (FLONASE) 50 MCG/ACT nasal spray Place 1 spray into both nostrils daily.     levothyroxine (SYNTHROID, LEVOTHROID) 50 MCG tablet Take 50 mcg by mouth daily before breakfast.     omeprazole (PRILOSEC) 40 MG capsule Take 40 mg by mouth every morning.     Plant Sterols and Stanols (CHOLESTOFF) 450 MG TABS Take 1 tablet by mouth every morning.     Probiotic Product (PROBIOTIC ADVANCED PO) Take 1 tablet by mouth at bedtime.     tadalafil (CIALIS) 5 MG tablet Take 5 mg by mouth daily as needed for erectile dysfunction.     tamsulosin (FLOMAX) 0.4 MG CAPS capsule Take 0.4 mg by mouth daily after supper.     ciprofloxacin (CIPRO) 500 MG tablet Take 1 tablet (500 mg total) by mouth 2 (two) times daily. (Patient not taking: Reported on 11/21/2022) 14 tablet 0   HYDROcodone-acetaminophen (NORCO/VICODIN) 5-325 MG tablet Will cause drowsiness.  Take only at bedtime.  Do not combine with anxiety or sleep med. (Patient not taking: Reported on 11/21/2022)     traZODone (DESYREL) 50 MG tablet  (Patient not taking: Reported on 11/21/2022)     No current facility-administered medications for this visit.    REVIEW OF SYSTEMS:    Pertinent information mentioned in HPI All other systems were reviewed with the patient and are negative.  PHYSICAL EXAMINATION: ECOG PERFORMANCE STATUS: 1 - Symptomatic but completely ambulatory  Vitals:   11/21/22 1101 11/21/22 1102  BP: (!) 140/79 138/71  Pulse: 73 70  Resp: 17   Temp: (!) 97.5 F (36.4 C)   SpO2: 99%    Filed Weights   11/21/22 1101  Weight: 145 lb (65.8 kg)    GENERAL:alert, no distress and comfortable SKIN: skin color, texture, turgor are normal, no rashes or significant lesions EYES: normal, conjunctiva are pink and non-injected, sclera clear OROPHARYNX:no exudate, no erythema and lips, buccal mucosa, and tongue normal  NECK: supple, thyroid normal size, non-tender, without nodularity LYMPH:  no palpable lymphadenopathy in the cervical, axillary or inguinal LUNGS: clear to auscultation and percussion with normal breathing effort HEART: regular rate & rhythm and no murmurs and no lower extremity edema ABDOMEN:abdomen soft, non-tender and normal bowel sounds Musculoskeletal:no cyanosis of digits and no clubbing  PSYCH:  alert & oriented x 3 with fluent speech NEURO: no focal motor/sensory deficits  LABORATORY DATA:  I have reviewed the data as listed Lab Results  Component Value Date   WBC 4.5 08/03/2021   HGB 13.8 08/03/2021   HCT 39.9 08/03/2021   MCV 100.5 (H) 08/03/2021   PLT 180 08/03/2021   Recent Labs    02/07/22 0631  NA 133*  K 3.7  CL 100  CO2 26  GLUCOSE 93  BUN 13  CREATININE 1.05  CALCIUM 9.5  GFRNONAA >60    RADIOGRAPHIC STUDIES: I have personally reviewed the radiological images as listed and agreed with the findings in the report. MR THORACIC SPINE WO CONTRAST  Result Date: 11/15/2022 CLINICAL DATA:  Mid back pain times several weeks. EXAM: MRI THORACIC SPINE WITHOUT CONTRAST TECHNIQUE: Multiplanar, multisequence MR imaging of the thoracic spine was performed. No intravenous contrast was administered. COMPARISON:   None Available. FINDINGS: Alignment:  Normal. Vertebrae: Compression fracture of the T12 superior endplate with hypointense line and mild surrounding marrow edema. 15 mm T1 hypointense marrow lesion with slight T2 hyperintensity on STIR in the anterior aspect of the L1 vertebral body (sagittal images 11, series 20 and 21). Cord:  Normal spinal cord signal and volume. Paraspinal and other soft tissues: Chronic scarring in the lung apices. No acute or significant findings. Disc levels: Small disc bulge and facet arthropathy contribute to mild right neural foraminal narrowing at T9-10. Disc bulge and facet arthropathy at T10-11 results in mild spinal canal stenosis. IMPRESSION: 1. Compression fracture of the T12 superior endplate with mild surrounding marrow edema, likely subacute. 2. 15 mm T1 hypointense marrow lesion in the anterior aspect of the L1 vertebral body, concerning for metastatic disease. Further characterization with postcontrast MRI of the thoracic spine is recommended. 3. Mild degenerative changes, with mild spinal canal stenosis at T10-11 and mild right neural foraminal narrowing at T9-10. Electronically Signed   By: Orvan Falconer M.D.   On: 11/15/2022 15:22

## 2022-11-21 NOTE — H&P (Signed)
Interventional Radiology - Clinic Visit, Initial H&P    Referring Provider: [name], MD  Reason for Visit: Back pain, compression fracture     History of Present Illness  Melvin Gilmore is a 82 y.o. male with a relevant past medical history of prostate cancer s/p HIFU in 2023 seen today in Interventional Radiology clinic for new onset back pain.  The patient reports initial onset of vague back pain that started several weeks ago, with recent sudden exacerbation approximately 2 weeks ago after a forceful sneeze in the backyard.  Patient went to initially get an x-ray which was unremarkable, followed by a thoracic spine MRI demonstrated a subacute T12 compression fracture.  Additional note was made of an indeterminate lesion and L1.  The patient does have a history of recent prostate cancer that was treated with high-frequency ultrasound in 2023.  No report of metastatic disease at time of diagnosis or treatment.  Recent PSA scores have been negative.  Note, patient was saw referring oncologist Dr. Alena Bills earlier today. Currently has PSA level and additional contrast-enhanced MRI of the thoracic and lumbar spine pending.    The patient reports there has been mild improvement in back pain since injury.  However he still reports moderate at least 5/10 back pain with movement.  He takes over-the-counter Tylenol/ibuprofen approximately 4 times a day for his back pain.  He reports moderate disability on the [employer] disability questionnaire with 14/24 positive.  He reports slowing in his mobility and inability to do his normal chores around the house because of his back pain.  He is only able to stand and walk for short periods of time.  His back pain has also affected his sleep and appetite.    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   Prostate cancer (HCC) 2023   Seasonal allergies    Skin cancer    a.) ears   Wears partial dentures    lower     Surgical History  Past Surgical History:  Procedure Laterality Date   APPENDECTOMY     CATARACT EXTRACTION W/PHACO Right 01/15/2016   Procedure: CATARACT EXTRACTION PHACO AND INTRAOCULAR LENS PLACEMENT (IOC);  Surgeon: Sherald Hess, MD;  [facility];  Service: Ophthalmology;  Laterality: Right;  RIGHT CALL CELL PHONE WITH TIME   CHOLECYSTECTOMY     COLONOSCOPY     COLONOSCOPY WITH ESOPHAGOGASTRODUODENOSCOPY (EGD)     ESOPHAGOGASTRODUODENOSCOPY  07/2021   ESOPHAGOGASTRODUODENOSCOPY (EGD) WITH PROPOFOL N/A 09/23/2017   Procedure: ESOPHAGOGASTRODUODENOSCOPY (EGD) WITH PROPOFOL;  Surgeon: Toledo, Boykin Nearing, MD;  Location: [facility];  Service: Gastroenterology;  Laterality: N/A;   HIGH INTENSITY FOCUSED ULTRASOUND (HIFU) OF THE PROSTATE N/A 02/07/2022   Procedure: HIGH INTENSITY FOCUSED ULTRASOUND (HIFU) OF THE PROSTATE;  Surgeon: Orson Ape, MD;  Location: [facility] OR[facility];  Service: Urology;  Laterality: N/A;   HYDROCELE EXCISION / REPAIR     LASIX RT EYE  PROSTATE BIOPSY N/A 01/03/2022   Procedure: PROSTATE BIOPSY  Addison Karbowski;  Surgeon: Orson Ape, MD;  Location: [facility] OR[facility];  Service: Urology;  Laterality: N/A;   ROTATOR CUFF REPAIR Left 2001     Medications  I have reviewed the current medication list. Refer to chart for details. Current Outpatient Medications  Medication Instructions   acetaminophen (TYLENOL) 500 mg, Oral, Every 6 hours PRN   ALPRAZolam (XANAX) 0.25-0.5 mg, Oral, At bedtime PRN   cetirizine (ZYRTEC)  10 mg, Oral, As needed   ciprofloxacin (CIPRO) 500 mg, Oral, 2 times daily   docusate sodium (COLACE) 200 mg, Oral, 2 times daily   fluticasone (FLONASE) 50 MCG/ACT nasal spray 1 spray, Each Nare, Daily   HYDROcodone-acetaminophen (NORCO/VICODIN) 5-325 MG tablet Will cause drowsiness.  Take only at bedtime.  Do not combine with anxiety or sleep med.   levothyroxine (SYNTHROID) 50 mcg, Oral, Daily before breakfast   omeprazole (PRILOSEC) 40 mg, Oral, BH-each morning   Plant Sterols and Stanols (CHOLESTOFF) 450 MG TABS 1 tablet, Oral, BH-each morning   Probiotic Product (PROBIOTIC ADVANCED PO) 1 tablet, Oral, Nightly   tadalafil (CIALIS) 5 mg, Oral, Daily PRN   tamsulosin (FLOMAX) 0.4 mg, Daily after supper   traZODone (DESYREL) 50 MG tablet       Allergies Allergies  Allergen Reactions   Amoxicillin-Pot Clavulanate Rash and Other (See Comments)    Pt states he can't recall what the reactions were   Meloxicam Rash and Other (See Comments)   Does patient have contrast allergy: No     Physical Exam Current Vitals Temp: 97.7 F (36.5 C) (Temp Source: Oral)  Pulse Rate: 74  Resp: 14  BP: 126/63  SpO2: 97 %        There is no height or weight on file to calculate BMI.  General: Alert and answers questions appropriately.  HEENT: Normocephalic, atraumatic.  Cardiac: Regular rate and rhythm.  Pulmonary: Normal work of breathing. On room air. Back: No significant TTP.    Pertinent Lab Results    Latest Ref Rng & Units 08/03/2021    3:50 PM  CBC  WBC 4.0 - 10.5 K/uL 4.5   Hemoglobin 13.0 - 17.0 g/dL 16.1   Hematocrit 09.6 - 52.0 % 39.9   Platelets 150 - 400 K/uL 180       Latest Ref Rng & Units 02/07/2022    6:31 AM 08/03/2021    3:50 PM  CMP  Glucose 70 - 99 mg/dL 93  045   BUN 8 - 23 mg/dL 13  28   Creatinine 4.09 - 1.24 mg/dL 8.11  9.14   Sodium 782 - 145 mmol/L 133  129   Potassium 3.5 - 5.1 mmol/L 3.7  4.2   Chloride 98 - 111 mmol/L 100  98   CO2 22 - 32 mmol/L  26  23   Calcium 8.9 - 10.3 mg/dL 9.5  8.6   Total Protein 6.5 - 8.1 g/dL  7.2   Total Bilirubin 0.3 - 1.2 mg/dL  1.1   Alkaline Phos 38 - 126 U/L  54   AST 15 - 41 U/L  30   ALT 0 - 44 U/L  28       Relevant and/or Recent Imaging: MRI T spine 11/15/2022  IMPRESSION: 1. Compression fracture of the T12 superior endplate with mild surrounding marrow edema, likely subacute. 2. 15 mm T1 hypointense marrow lesion in the anterior aspect of the L1 vertebral body, concerning for metastatic disease.  Further characterization with postcontrast MRI of the thoracic spine is recommended. 3. Mild degenerative changes, with mild spinal canal stenosis at T10-11 and mild right neural foraminal narrowing at T9-10. Electronically Signed By: Orvan Falconer M.D. On: 11/15/2022 15:22   CXR Jan 2023  Personally reviewed for bone density purposes.  The patient is osteopenic.     Assessment & Plan:   Patient has suffered subacute suspected osteoporotic fracture of the T12 vertebra. Additional recent imaging shows indeterminate lesion at L1, which I suspect is less contributory to this pain. However given recent history of prostate cancer, will follow up PSA levels and contrast-enhanced MRI of the T/L spine for further evaluation.   If there is a benign-appearing fracture at T12, will likely plan for T12 KP +/- L1 biopsy (may have to CT guided depending on imaging).   If there is a pathologic fracture at T12, will likely plan for OsteoCool at T12, including bone biopsy, regardless of findings at L1. If biopsy results are negative, can re-evaluate L1 biopsy.   History and exam have demonstrated the following:  Acute/Subacute fracture by imaging dated November 15 2022 and Significant disability on the [employer] Disability Questionnaire with 14/24 positive symptoms, reflecting significant impact/impairment of (ADLs)   ICD-10-CM Codes that Support Medical Necessity  (WelshBlog.at.aspx?articleId=[email or web address])  M80.08XA    Age-related osteoporosis with current pathological fracture, vertebra(e), initial encounter for fracture    Plan:  Probable T12 vertebral body augmentation with balloon kyphoplasty vs OsteoCool pending PSA, MRI w/ contrast results (+/- L1 bone biopsy)  Post-procedure disposition: outpatient DRI-A  Medication holds: TBD  The patient has suffered a fracture of the T12 vertebral body. It is recommended that patients aged 7 years or older be evaluated for possible testing or treatment of osteoporosis. A copy of this consult report is sent to the patient's referring physician.  Advanced Care Plan: The patient did not want to provide an Advanced Care Plan at the time of this visit     Total time spent on today's visit was over 60 Minutes, including both face-to-face time and non face-to-face time, personally spent on review of chart (including labs and relevant imaging), discussing further workup and treatment options, referral to specialist if needed, reviewing outside records if pertinent, answering patient questions, and coordinating care regarding T12 fracture as well as management strategy.      Olive Bass, MD  Vascular and Interventional Radiology 11/21/2022 3:28 PM

## 2022-11-21 NOTE — Telephone Encounter (Signed)
Confirmed with Marylu Lund (scheduling) and Zollie Scale request for urgent IR referral(for compression fracture for kypho ) by Dr. Alena Bills has been received and will be scheduled.

## 2022-11-21 NOTE — Progress Notes (Signed)
Patient here for oncology follow-up appointment, concerns of low appetite and back pain from compression fracture

## 2022-11-22 ENCOUNTER — Encounter: Payer: Self-pay | Admitting: Internal Medicine

## 2022-11-22 LAB — KAPPA/LAMBDA LIGHT CHAINS
Kappa free light chain: 453.8 mg/L — ABNORMAL HIGH (ref 3.3–19.4)
Kappa, lambda light chain ratio: 66.74 — ABNORMAL HIGH (ref 0.26–1.65)
Lambda free light chains: 6.8 mg/L (ref 5.7–26.3)

## 2022-11-22 LAB — IMMUNOFIXATION, URINE

## 2022-11-22 LAB — VITAMIN B12: Vitamin B-12: 743 pg/mL (ref 180–914)

## 2022-11-22 LAB — PSA: Prostatic Specific Antigen: 3.44 ng/mL (ref 0.00–4.00)

## 2022-11-26 LAB — MULTIPLE MYELOMA PANEL, SERUM
Albumin SerPl Elph-Mcnc: 3.9 g/dL (ref 2.9–4.4)
Albumin/Glob SerPl: 0.8 (ref 0.7–1.7)
Alpha 1: 0.2 g/dL (ref 0.0–0.4)
Alpha2 Glob SerPl Elph-Mcnc: 0.5 g/dL (ref 0.4–1.0)
B-Globulin SerPl Elph-Mcnc: 0.9 g/dL (ref 0.7–1.3)
Gamma Glob SerPl Elph-Mcnc: 3.3 g/dL — ABNORMAL HIGH (ref 0.4–1.8)
Globulin, Total: 4.9 g/dL — ABNORMAL HIGH (ref 2.2–3.9)
IgA: 3166 mg/dL — ABNORMAL HIGH (ref 61–437)
IgG (Immunoglobin G), Serum: 562 mg/dL — ABNORMAL LOW (ref 603–1613)
IgM (Immunoglobulin M), Srm: 13 mg/dL — ABNORMAL LOW (ref 15–143)
M Protein SerPl Elph-Mcnc: 2.1 g/dL — ABNORMAL HIGH
Total Protein ELP: 8.8 g/dL — ABNORMAL HIGH (ref 6.0–8.5)

## 2022-11-28 ENCOUNTER — Ambulatory Visit
Admission: RE | Admit: 2022-11-28 | Discharge: 2022-11-28 | Disposition: A | Discharge status: Home / Self Care | Payer: Medicare HMO | Source: Ambulatory Visit | Attending: Internal Medicine | Admitting: Internal Medicine

## 2022-11-28 DIAGNOSIS — D649 Anemia, unspecified: Secondary | ICD-10-CM | POA: Diagnosis present

## 2022-11-28 DIAGNOSIS — Z8546 Personal history of malignant neoplasm of prostate: Secondary | ICD-10-CM | POA: Insufficient documentation

## 2022-11-28 DIAGNOSIS — R937 Abnormal findings on diagnostic imaging of other parts of musculoskeletal system: Secondary | ICD-10-CM | POA: Insufficient documentation

## 2022-11-28 MED ORDER — GADOBUTROL 1 MMOL/ML IV SOLN
6.0000 mL | Freq: Once | INTRAVENOUS | Status: AC | PRN
  Administered 2022-11-28: 6 mL via INTRAVENOUS

## 2022-12-03 ENCOUNTER — Other Ambulatory Visit: Payer: Self-pay | Admitting: Interventional Radiology

## 2022-12-03 ENCOUNTER — Encounter: Payer: Self-pay | Admitting: Internal Medicine

## 2022-12-03 ENCOUNTER — Inpatient Hospital Stay (HOSPITAL_BASED_OUTPATIENT_CLINIC_OR_DEPARTMENT_OTHER): Payer: Medicare HMO | Admitting: Internal Medicine

## 2022-12-03 VITALS — BP 140/70 | HR 78 | Temp 98.1°F | Wt 146.6 lb

## 2022-12-03 DIAGNOSIS — M4854XA Collapsed vertebra, not elsewhere classified, thoracic region, initial encounter for fracture: Secondary | ICD-10-CM | POA: Diagnosis not present

## 2022-12-03 DIAGNOSIS — R937 Abnormal findings on diagnostic imaging of other parts of musculoskeletal system: Secondary | ICD-10-CM

## 2022-12-03 DIAGNOSIS — M899 Disorder of bone, unspecified: Secondary | ICD-10-CM

## 2022-12-03 DIAGNOSIS — R778 Other specified abnormalities of plasma proteins: Secondary | ICD-10-CM

## 2022-12-03 DIAGNOSIS — S22080A Wedge compression fracture of T11-T12 vertebra, initial encounter for closed fracture: Secondary | ICD-10-CM | POA: Diagnosis not present

## 2022-12-03 MED ORDER — HYDROCODONE-ACETAMINOPHEN 5-325 MG PO TABS: 1.0000 | ORAL_TABLET | Freq: Four times a day (QID) | ORAL | 0 refills | Status: DC | PRN

## 2022-12-03 NOTE — Progress Notes (Signed)
[facility] CONSULT NOTE  Patient Care Team: [name], MD as PCP - General (Internal Medicine)  REFERRING PROVIDER: Ignacia Bayley, PA  REASON FOR REFFERAL: Abnormal T-spine MRI  CANCER STAGING   Cancer Staging  No matching staging information was found for the patient.  ASSESSMENT & PLAN:  Melvin Gilmore 82 y.o. male with pmh of hypertension, hyperlipidemia, anxiety, GERD, BPH, hypothyroidism, prostate cancer s/p HIFU was seen by primary on November 13, 2022 for acute onset of lower back pain.  Was referred to medical oncology for finding of L1 lesion suspicious for metastasis.  # Compression fracture of T12 # L1 lesion # Lower back pain from above -Patient developed acute back pain after a heavy sneeze. MRI thoracic spine without contrast showed compression fracture of T12 superior endplate with surrounding marrow edema likely subacute.  15 mm T1 hypointense marrow lesion in the anterior aspect of L1 suspicious for metastasis.  Further characterization with postcontrast MRI recommended.  -MRI thoracic and lumbar spine with and without contrast was reviewed. Showed unchanged subacute compression fracture at T12.  Focal enhancing marrow replacing lesions in T6, T8, T5, T2 all suspicious for metastatic disease.  L1 lesion measuring 16 mm.  Multilevel degenerative changes present.  - SPEP/IFE showed biclonal IgA kappa paraprotein with M spike 2.1 g/dL and second spike at 0.4 g/dL.  Kappa 453, lambda 6.8 with a ratio of 66.7.  Iron panel and B12 are normal.  PSA 3.44.  CBC showed mild anemia 13.4, chronic.  On CMP normal creatinine and calcium levels.  Elevated total protein 8.8.  I discussed the case with Dr. Elijio Miles from IR.  The above findings are suspicious for multiple myeloma.  PSA level is normal so my suspicion for metastatic prostate cancer is low.  Will schedule him for bone marrow biopsy and L1 lesion biopsy.  He will also be scheduled for T12 kyphoplasty for pain  relief.  He is taking Tylenol and ibuprofen once a day and pain is not controlled.  I discussed about him not to take ibuprofen more than once a day.  I will add Vicodin 5-325 mg every 6 hours as needed.  He was made aware that it contains Tylenol so he can add additional Tylenol in between.  Optimize bowel regimen.  Continue with Colace twice a day.  Add MiraLAX once a day as needed.  # History of prostate cancer - follows with Dr. Evelene Croon, urology for elevated PSA.  MRI prostate showed 1 mm category 5 lesion of anterior transition zone.  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 s/p HIFU of the prostate on 02/07/2022.  PSA level from 10/08/2022 was 3.34.  -PSA from 11/21/2022 3.44.  # Normocytic anemia -Chronic, hemoglobin 13.4.   -Iron panel, B12 and folate normal.   No orders of the defined types were placed in this encounter. RTC in 1 week for MD visit to discuss biopsy results.   The total time spent in the appointment was 30 minutes encounter with patients including review of chart and various tests results, discussions about plan of care and coordination of care plan   All questions were answered. The patient knows to call the clinic with any problems, questions or concerns. No barriers to learning was detected.  [name], MD 5/14/202412:13 PM   HISTORY OF PRESENTING ILLNESS:  Melvin Gilmore 82 y.o. male with past medical history of hypertension, hyperlipidemia, anxiety, GERD, BPH, hypothyroidism, prostate cancer s/p HIFU  was seen by primary on November 13, 2022 for acute onset of lower back pain.  Patient reports that he was sitting outside and had violent sneeze which was followed by severe back pain.  He has fallen allergies.  Had x-rays followed by MRI thoracic spine without contrast.  It showed compression fracture of T12 superior endplate with surrounding marrow edema likely subacute.  15 mm T1 hypointense marrow lesion in  the anterior aspect of L1 suspicious for metastasis.  Further characterization with postcontrast MRI recommended.  Prostate cancer-follows with Dr. Evelene Croon, urology for elevated PSA.  MRI prostate showed 1 mm category 5 lesion of anterior transition zone.  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 s/p HIFU of the prostate on 02/07/2022.  PSA level from 10/08/2022 was 3.34.  Interval history Patient seen today accompanied with his wife to discuss MRI spine results and next steps of treatment.  Since the last visit, his mid back pain has worsened and does not happen to find a position where he feels comfortable.  He is taking Tylenol and ibuprofen which is not helping.  I have reviewed his chart and materials related to his cancer extensively and collaborated history with the patient. Summary of oncologic history is as follows: Oncology History   No history exists.    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   Prostate cancer (HCC) 2023   Seasonal allergies    Skin cancer    a.) ears   Wears partial dentures    lower    SURGICAL HISTORY: Past Surgical History:  Procedure Laterality Date   APPENDECTOMY     CATARACT EXTRACTION W/PHACO Right 01/15/2016   Procedure: CATARACT EXTRACTION PHACO AND INTRAOCULAR LENS PLACEMENT (IOC);  Surgeon: Sherald Hess, MD;  Location: [facility];  Service: Ophthalmology;  Laterality: Right;  RIGHT CALL CELL PHONE WITH TIME    CHOLECYSTECTOMY     COLONOSCOPY     COLONOSCOPY WITH ESOPHAGOGASTRODUODENOSCOPY (EGD)     ESOPHAGOGASTRODUODENOSCOPY  07/2021   ESOPHAGOGASTRODUODENOSCOPY (EGD) WITH PROPOFOL N/A 09/23/2017   Procedure: ESOPHAGOGASTRODUODENOSCOPY (EGD) WITH PROPOFOL;  Surgeon: Toledo, Boykin Nearing, MD;  Location: [facility];  Service: Gastroenterology;  Laterality: N/A;   HIGH INTENSITY FOCUSED ULTRASOUND (HIFU) OF THE PROSTATE N/A 02/07/2022   Procedure: HIGH INTENSITY FOCUSED ULTRASOUND (HIFU) OF THE PROSTATE;  Surgeon: Orson Ape, MD;  Location: [facility];  Service: Urology;  Laterality: N/A;   HYDROCELE EXCISION / REPAIR     IR RADIOLOGIST EVAL & MGMT  11/21/2022   LASIX RT EYE     PROSTATE BIOPSY N/A 01/03/2022   Procedure: PROSTATE BIOPSY  Addison Branson;  Surgeon: Orson Ape, MD;  Location: [facility];  Service: Urology;  Laterality: N/A;   ROTATOR CUFF REPAIR Left 2001    SOCIAL HISTORY: Social History   Socioeconomic History   Marital status: Married    Spouse name: Not on file  Number of children: Not on file   Years of education: Not on file   Highest education level: Not on file  Occupational History   Not on file  Tobacco Use   Smoking status: Never   Smokeless tobacco: Never  Vaping Use   Vaping Use: Never used  Substance and Sexual Activity   Alcohol use: Yes    Alcohol/week: 7.0 standard drinks of alcohol    Types: 7 Glasses of wine per week    Comment: wine occ   Drug use: No   Sexual activity: Not on file  Other Topics Concern   Not on file  Social History Narrative   Not on file   Social Determinants of Health   Financial Resource Strain: Low Risk  (11/21/2022)   Overall Financial Resource Strain (CARDIA)    Difficulty of Paying Living Expenses: Not hard at all  Food Insecurity: No Food Insecurity (11/21/2022)   Hunger Vital Sign    Worried About Running Out of Food in the Last Year: Never true    Ran Out of Food in the Last Year: Never true  Transportation Needs:  Not on file  Physical Activity: Not on file  Stress: Not on file  Social Connections: Not on file  Intimate Partner Violence: Not At Risk (11/21/2022)   Humiliation, Afraid, Rape, and Kick questionnaire    Fear of Current or Ex-Partner: No    Emotionally Abused: No    Physically Abused: No    Sexually Abused: No    FAMILY HISTORY: Family History  Problem Relation Age of Onset   Pneumonia Father     ALLERGIES:  is allergic to amoxicillin-pot clavulanate and meloxicam.  MEDICATIONS:  Current Outpatient Medications  Medication Sig Dispense Refill   acetaminophen (TYLENOL) 500 MG tablet Take 500 mg by mouth every 6 (six) hours as needed.     ALPRAZolam (XANAX) 0.5 MG tablet Take 0.25-0.5 mg by mouth at bedtime as needed for anxiety.     cetirizine (ZYRTEC) 10 MG tablet Take 10 mg by mouth as needed for allergies.     ciprofloxacin (CIPRO) 500 MG tablet Take 1 tablet (500 mg total) by mouth 2 (two) times daily. 14 tablet 0   docusate sodium (COLACE) 100 MG capsule Take 2 capsules (200 mg total) by mouth 2 (two) times daily. 120 capsule 3   fluticasone (FLONASE) 50 MCG/ACT nasal spray Place 1 spray into both nostrils daily.     levothyroxine (SYNTHROID, LEVOTHROID) 50 MCG tablet Take 50 mcg by mouth daily before breakfast.     omeprazole (PRILOSEC) 40 MG capsule Take 40 mg by mouth every morning.     Plant Sterols and Stanols (CHOLESTOFF) 450 MG TABS Take 1 tablet by mouth every morning.     Probiotic Product (PROBIOTIC ADVANCED PO) Take 1 tablet by mouth at bedtime.     tadalafil (CIALIS) 5 MG tablet Take 5 mg by mouth daily as needed for erectile dysfunction.     HYDROcodone-acetaminophen (NORCO/VICODIN) 5-325 MG tablet Take 1 tablet by mouth every 6 (six) hours as needed for severe pain. 90 tablet 0   tamsulosin (FLOMAX) 0.4 MG CAPS capsule Take 0.4 mg by mouth daily after supper. (Patient not taking: Reported on 11/21/2022)     traZODone (DESYREL) 50 MG tablet  (Patient not taking:  Reported on 11/21/2022)     No current facility-administered medications for this visit.    REVIEW OF SYSTEMS:   Pertinent information mentioned in HPI All other systems were reviewed  with the patient and are negative.  PHYSICAL EXAMINATION: ECOG PERFORMANCE STATUS: 1 - Symptomatic but completely ambulatory  Vitals:   12/03/22 1021  BP: (!) 140/70  Pulse: 78  Temp: 98.1 F (36.7 C)  SpO2: 100%   Filed Weights   12/03/22 1021  Weight: 146 lb 9.6 oz (66.5 kg)    GENERAL:alert, no distress and comfortable SKIN: skin color, texture, turgor are normal, no rashes or significant lesions EYES: normal, conjunctiva are pink and non-injected, sclera clear OROPHARYNX:no exudate, no erythema and lips, buccal mucosa, and tongue normal  NECK: supple, thyroid normal size, non-tender, without nodularity LYMPH:  no palpable lymphadenopathy in the cervical, axillary or inguinal LUNGS: clear to auscultation and percussion with normal breathing effort HEART: regular rate & rhythm and no murmurs and no lower extremity edema ABDOMEN:abdomen soft, non-tender and normal bowel sounds Musculoskeletal:no cyanosis of digits and no clubbing  PSYCH: alert & oriented x 3 with fluent speech NEURO: no focal motor/sensory deficits  LABORATORY DATA:  I have reviewed the data as listed Lab Results  Component Value Date   WBC 4.5 08/03/2021   HGB 13.8 08/03/2021   HCT 39.9 08/03/2021   MCV 100.5 (H) 08/03/2021   PLT 180 08/03/2021   Recent Labs    02/07/22 0631  NA 133*  K 3.7  CL 100  CO2 26  GLUCOSE 93  BUN 13  CREATININE 1.05  CALCIUM 9.5  GFRNONAA >60    RADIOGRAPHIC STUDIES: I have personally reviewed the radiological images as listed and agreed with the findings in the report. MR Lumbar Spine W Wo Contrast  Result Date: 11/29/2022 CLINICAL DATA:  Compression fracture, thoracic, known malignancy compression fracture/ new lesion; compression fracture/ new lesion. EXAM: MRI THORACIC AND  LUMBAR SPINE WITHOUT AND WITH CONTRAST TECHNIQUE: Multiplanar and multiecho pulse sequences of the thoracic and lumbar spine were obtained without and with intravenous contrast. CONTRAST:  6mL GADAVIST GADOBUTROL 1 MMOL/ML IV SOLN COMPARISON:  MRI thoracic spine 11/15/2022. MRI lumbar spine 03/08/2006. FINDINGS: MRI THORACIC SPINE FINDINGS Alignment:  Normal. Vertebrae: Unchanged subacute compression fracture of the T12 vertebral body with mild height loss. Focal enhancing, marrow replacing lesions in the right pedicle of T6 (sagittal image 5 series 24), right inferior endplate T8 (sagittal image 6 series 24), left inferior endplate of T5 (sagittal image 11 series 24), and left pedicle of T2 (sagittal image 13 series 24), all suspicious for metastatic disease. No epidural or paraspinal extension. Cord: Normal spinal cord signal and volume. No abnormal enhancement. Paraspinal and other soft tissues: Unchanged chronic scarring in the lung apices. Disc levels: Unchanged small disc bulge and facet arthropathy contributing to mild right neural foraminal narrowing at T9-10. Unchanged disc bulge and facet arthropathy at T10-11 resulting in mild spinal canal stenosis. MRI LUMBAR SPINE FINDINGS Segmentation:  Standard. Alignment:  Normal. Vertebrae: Multiple focal enhancing, marrow replacing lesions in the L1 vertebral body, measuring up to 16 mm (sagittal image 11 series 6). Smaller enhancing, marrow replacing lesions in the superior endplate of L2 (image 8 series 6) and superior endplate of L5 (image 7 series 6), again suspicious for metastatic disease. No epidural or paraspinal extension. Conus medullaris: Extends to the L1 level and appears normal. No abnormal enhancement of the conus or cauda equina nerve roots. Paraspinal and other soft tissues: Unremarkable. Disc levels: T12-L1:  Unremarkable. L1-L2:  Unremarkable. L2-L3: Disc bulge and facet arthropathy results in mild spinal canal stenosis. No significant neural  foraminal narrowing. L3-L4: Disc bulge and facet arthropathy  results in moderate spinal canal stenosis with compression of the traversing right L4 nerve root in the right lateral recess. Mild bilateral neural foraminal narrowing. L4-L5: Disc bulge and facet arthropathy results in mild spinal canal stenosis with compression of the traversing right L5 nerve root in the right lateral recess. Moderate left and mild right neural foraminal narrowing. L5-S1: Disc bulge and facet arthropathy results in compression of the traversing S1 nerve roots in the lateral recesses bilaterally. No significant neural foraminal narrowing. IMPRESSION: 1. Multiple enhancing, marrow replacing lesions in the thoracic and lumbar spine, suspicious for metastatic disease. No epidural or paraspinal extension. 2. Multilevel degenerative changes in the lumbar spine, worst at L3-L4 where there is moderate spinal canal stenosis and compression of the traversing right L4 nerve root in the right lateral recess. 3. At L4-L5, mild spinal canal stenosis with compression of the traversing right L5 nerve root in the right lateral recess. Moderate left neural foraminal narrowing. 4. At L5-S1, compression of the traversing S1 nerve roots in the lateral recesses bilaterally. 5. Unchanged subacute compression fracture of the T12 vertebral body with mild height loss. Electronically Signed   By: Orvan Falconer M.D.   On: 11/29/2022 14:58   MR Thoracic Spine W Wo Contrast  Result Date: 11/29/2022 CLINICAL DATA:  Compression fracture, thoracic, known malignancy compression fracture/ new lesion; compression fracture/ new lesion. EXAM: MRI THORACIC AND LUMBAR SPINE WITHOUT AND WITH CONTRAST TECHNIQUE: Multiplanar and multiecho pulse sequences of the thoracic and lumbar spine were obtained without and with intravenous contrast. CONTRAST:  6mL GADAVIST GADOBUTROL 1 MMOL/ML IV SOLN COMPARISON:  MRI thoracic spine 11/15/2022. MRI lumbar spine 03/08/2006. FINDINGS:  MRI THORACIC SPINE FINDINGS Alignment:  Normal. Vertebrae: Unchanged subacute compression fracture of the T12 vertebral body with mild height loss. Focal enhancing, marrow replacing lesions in the right pedicle of T6 (sagittal image 5 series 24), right inferior endplate T8 (sagittal image 6 series 24), left inferior endplate of T5 (sagittal image 11 series 24), and left pedicle of T2 (sagittal image 13 series 24), all suspicious for metastatic disease. No epidural or paraspinal extension. Cord: Normal spinal cord signal and volume. No abnormal enhancement. Paraspinal and other soft tissues: Unchanged chronic scarring in the lung apices. Disc levels: Unchanged small disc bulge and facet arthropathy contributing to mild right neural foraminal narrowing at T9-10. Unchanged disc bulge and facet arthropathy at T10-11 resulting in mild spinal canal stenosis. MRI LUMBAR SPINE FINDINGS Segmentation:  Standard. Alignment:  Normal. Vertebrae: Multiple focal enhancing, marrow replacing lesions in the L1 vertebral body, measuring up to 16 mm (sagittal image 11 series 6). Smaller enhancing, marrow replacing lesions in the superior endplate of L2 (image 8 series 6) and superior endplate of L5 (image 7 series 6), again suspicious for metastatic disease. No epidural or paraspinal extension. Conus medullaris: Extends to the L1 level and appears normal. No abnormal enhancement of the conus or cauda equina nerve roots. Paraspinal and other soft tissues: Unremarkable. Disc levels: T12-L1:  Unremarkable. L1-L2:  Unremarkable. L2-L3: Disc bulge and facet arthropathy results in mild spinal canal stenosis. No significant neural foraminal narrowing. L3-L4: Disc bulge and facet arthropathy results in moderate spinal canal stenosis with compression of the traversing right L4 nerve root in the right lateral recess. Mild bilateral neural foraminal narrowing. L4-L5: Disc bulge and facet arthropathy results in mild spinal canal stenosis with  compression of the traversing right L5 nerve root in the right lateral recess. Moderate left and mild right neural foraminal narrowing. L5-S1:  Disc bulge and facet arthropathy results in compression of the traversing S1 nerve roots in the lateral recesses bilaterally. No significant neural foraminal narrowing. IMPRESSION: 1. Multiple enhancing, marrow replacing lesions in the thoracic and lumbar spine, suspicious for metastatic disease. No epidural or paraspinal extension. 2. Multilevel degenerative changes in the lumbar spine, worst at L3-L4 where there is moderate spinal canal stenosis and compression of the traversing right L4 nerve root in the right lateral recess. 3. At L4-L5, mild spinal canal stenosis with compression of the traversing right L5 nerve root in the right lateral recess. Moderate left neural foraminal narrowing. 4. At L5-S1, compression of the traversing S1 nerve roots in the lateral recesses bilaterally. 5. Unchanged subacute compression fracture of the T12 vertebral body with mild height loss. Electronically Signed   By: Orvan Falconer M.D.   On: 11/29/2022 14:58   IR Radiologist Eval & Mgmt  Result Date: 11/21/2022 EXAM: NEW PATIENT OFFICE VISIT CHIEF COMPLAINT: Refer to EMR HISTORY OF PRESENT ILLNESS: The patient reports initial onset of vague pain that started several weeks ago, with recent sudden exacerbation approximately 2 weeks ago after a forceful sneeze in the backyard. Patient went to initially get an x-ray which was unremarkable, followed by a thoracic spine MRI demonstrated a subacute T12 compression fracture. Additional note was made of an indeterminate lesion and L1. The patient does have a history of recent prostate cancer that was treated with high-frequency ultrasound in 2023. No report of metastatic disease at time of diagnosis or treatment. Recent PSA scores have been negative. Note, patient was saw referring oncologist Dr. Alena Bills earlier today. Currently has PSA level  and additional contrast-enhanced MRI of the thoracic and lumbar spine pending. The patient reports there has been mild improvement in back pain since injury. However he still reports moderate at least 5/10 back pain with movement. He takes over-the-counter Tylenol/ibuprofen approximately 4 times a day for his back pain. He reports moderate disability on the [employer] disability questionnaire with 14/24 positive. He reports slowing in his mobility and inability to do his normal chores around the house because of his back pain. He is only able to stand and walk for short periods of time. His back pain has also affected his sleep and appetite. Personally reviewed for bone density purposes. The patient is osteopenic. REVIEW OF SYSTEMS: Refer to EMR PHYSICAL EXAMINATION: Refer to EMR ASSESSMENT AND PLAN: Refer to EMR Electronically Signed   By: Olive Bass M.D.   On: 11/21/2022 16:34   MR THORACIC SPINE WO CONTRAST  Result Date: 11/15/2022 CLINICAL DATA:  Mid back pain times several weeks. EXAM: MRI THORACIC SPINE WITHOUT CONTRAST TECHNIQUE: Multiplanar, multisequence MR imaging of the thoracic spine was performed. No intravenous contrast was administered. COMPARISON:  None Available. FINDINGS: Alignment:  Normal. Vertebrae: Compression fracture of the T12 superior endplate with hypointense line and mild surrounding marrow edema. 15 mm T1 hypointense marrow lesion with slight T2 hyperintensity on STIR in the anterior aspect of the L1 vertebral body (sagittal images 11, series 20 and 21). Cord:  Normal spinal cord signal and volume. Paraspinal and other soft tissues: Chronic scarring in the lung apices. No acute or significant findings. Disc levels: Small disc bulge and facet arthropathy contribute to mild right neural foraminal narrowing at T9-10. Disc bulge and facet arthropathy at T10-11 results in mild spinal canal stenosis. IMPRESSION: 1. Compression fracture of the T12 superior endplate with mild  surrounding marrow edema, likely subacute. 2. 15 mm T1 hypointense marrow  lesion in the anterior aspect of the L1 vertebral body, concerning for metastatic disease. Further characterization with postcontrast MRI of the thoracic spine is recommended. 3. Mild degenerative changes, with mild spinal canal stenosis at T10-11 and mild right neural foraminal narrowing at T9-10. Electronically Signed   By: Orvan Falconer M.D.   On: 11/15/2022 15:22

## 2022-12-03 NOTE — Patient Instructions (Signed)
Take Miralax powder once a day as needed for constipation.

## 2022-12-03 NOTE — Progress Notes (Signed)
Pt is having severe lower back pain he rates it at a 9. He has been having this severe lower back pain for about 3 weeks now. He can't sleep good due to the pain.

## 2022-12-04 ENCOUNTER — Inpatient Hospital Stay: Payer: Medicare HMO

## 2022-12-04 ENCOUNTER — Other Ambulatory Visit: Payer: Self-pay | Admitting: Internal Medicine

## 2022-12-04 ENCOUNTER — Other Ambulatory Visit: Payer: Self-pay | Admitting: Student

## 2022-12-04 DIAGNOSIS — R778 Other specified abnormalities of plasma proteins: Secondary | ICD-10-CM

## 2022-12-04 DIAGNOSIS — S22080A Wedge compression fracture of T11-T12 vertebra, initial encounter for closed fracture: Secondary | ICD-10-CM

## 2022-12-04 DIAGNOSIS — M8588 Other specified disorders of bone density and structure, other site: Secondary | ICD-10-CM

## 2022-12-04 NOTE — Progress Notes (Signed)
Patient for CT guided Bone lesion biopsy & CT Bone Marrow Biopsy on Thurs 12/05/2022, I called and spoke with the patient's wife, Melvin Gilmore on the phone and gave pre-procedure instructions. Melvin Gilmore was made aware to have the patient here at 7:30a, NPO after MN prior to procedure as well as driver post procedure/recovery/discharge. Melvin Gilmore stated understanding.  Called 12/02/2022 and 12/03/2022

## 2022-12-05 ENCOUNTER — Ambulatory Visit
Admission: RE | Admit: 2022-12-05 | Discharge: 2022-12-05 | Disposition: A | Discharge status: Home / Self Care | Payer: Medicare HMO | Source: Ambulatory Visit | Attending: Interventional Radiology | Admitting: Interventional Radiology

## 2022-12-05 ENCOUNTER — Other Ambulatory Visit: Payer: Self-pay

## 2022-12-05 DIAGNOSIS — I10 Essential (primary) hypertension: Secondary | ICD-10-CM | POA: Diagnosis not present

## 2022-12-05 DIAGNOSIS — F419 Anxiety disorder, unspecified: Secondary | ICD-10-CM | POA: Diagnosis not present

## 2022-12-05 DIAGNOSIS — M899 Disorder of bone, unspecified: Secondary | ICD-10-CM | POA: Diagnosis not present

## 2022-12-05 DIAGNOSIS — Z8546 Personal history of malignant neoplasm of prostate: Secondary | ICD-10-CM | POA: Insufficient documentation

## 2022-12-05 DIAGNOSIS — R778 Other specified abnormalities of plasma proteins: Secondary | ICD-10-CM

## 2022-12-05 DIAGNOSIS — M549 Dorsalgia, unspecified: Secondary | ICD-10-CM | POA: Insufficient documentation

## 2022-12-05 DIAGNOSIS — K219 Gastro-esophageal reflux disease without esophagitis: Secondary | ICD-10-CM | POA: Diagnosis not present

## 2022-12-05 DIAGNOSIS — D649 Anemia, unspecified: Secondary | ICD-10-CM | POA: Diagnosis not present

## 2022-12-05 DIAGNOSIS — C9000 Multiple myeloma not having achieved remission: Secondary | ICD-10-CM | POA: Insufficient documentation

## 2022-12-05 DIAGNOSIS — Z1379 Encounter for other screening for genetic and chromosomal anomalies: Secondary | ICD-10-CM | POA: Diagnosis not present

## 2022-12-05 DIAGNOSIS — M4854XA Collapsed vertebra, not elsewhere classified, thoracic region, initial encounter for fracture: Secondary | ICD-10-CM | POA: Diagnosis not present

## 2022-12-05 LAB — CBC WITH DIFFERENTIAL/PLATELET
Abs Immature Granulocytes: 0.01 10*3/uL (ref 0.00–0.07)
Basophils Absolute: 0 10*3/uL (ref 0.0–0.1)
Basophils Relative: 0 %
Eosinophils Absolute: 0.1 10*3/uL (ref 0.0–0.5)
Eosinophils Relative: 1 %
HCT: 36 % — ABNORMAL LOW (ref 39.0–52.0)
Hemoglobin: 12.5 g/dL — ABNORMAL LOW (ref 13.0–17.0)
Immature Granulocytes: 0 %
Lymphocytes Relative: 46 %
Lymphs Abs: 2.1 10*3/uL (ref 0.7–4.0)
MCH: 34.5 pg — ABNORMAL HIGH (ref 26.0–34.0)
MCHC: 34.7 g/dL (ref 30.0–36.0)
MCV: 99.4 fL (ref 80.0–100.0)
Monocytes Absolute: 0.4 10*3/uL (ref 0.1–1.0)
Monocytes Relative: 9 %
Neutro Abs: 2.1 10*3/uL (ref 1.7–7.7)
Neutrophils Relative %: 44 %
Platelets: 159 10*3/uL (ref 150–400)
RBC: 3.62 MIL/uL — ABNORMAL LOW (ref 4.22–5.81)
RDW: 14.1 % (ref 11.5–15.5)
WBC: 4.7 10*3/uL (ref 4.0–10.5)
nRBC: 0 % (ref 0.0–0.2)

## 2022-12-05 MED ORDER — FENTANYL CITRATE (PF) 100 MCG/2ML IJ SOLN
INTRAMUSCULAR | Status: AC
  Filled 2022-12-05: qty 2

## 2022-12-05 MED ORDER — LIDOCAINE HCL (PF) 1 % IJ SOLN
10.0000 mL | Freq: Once | INTRAMUSCULAR | Status: AC
  Administered 2022-12-05: 10 mL
  Filled 2022-12-05: qty 10

## 2022-12-05 MED ORDER — HEPARIN SOD (PORK) LOCK FLUSH 100 UNIT/ML IV SOLN
500.0000 [IU] | Freq: Once | INTRAVENOUS | Status: AC
  Administered 2022-12-05: 500 [IU] via INTRAVENOUS
  Filled 2022-12-05: qty 5

## 2022-12-05 MED ORDER — MIDAZOLAM HCL 2 MG/2ML IJ SOLN
INTRAMUSCULAR | Status: AC | PRN
  Administered 2022-12-05 (×2): 1 mg via INTRAVENOUS

## 2022-12-05 MED ORDER — HEPARIN SOD (PORK) LOCK FLUSH 100 UNIT/ML IV SOLN
INTRAVENOUS | Status: AC
  Filled 2022-12-05: qty 5

## 2022-12-05 MED ORDER — FENTANYL CITRATE (PF) 100 MCG/2ML IJ SOLN
INTRAMUSCULAR | Status: AC | PRN
  Administered 2022-12-05 (×2): 50 ug via INTRAVENOUS

## 2022-12-05 MED ORDER — SODIUM CHLORIDE 0.9 % IV SOLN: INTRAVENOUS | Status: DC

## 2022-12-05 MED ORDER — MIDAZOLAM HCL 2 MG/2ML IJ SOLN
INTRAMUSCULAR | Status: AC
  Filled 2022-12-05: qty 2

## 2022-12-05 MED ORDER — LIDOCAINE HCL (PF) 1 % IJ SOLN
10.0000 mL | Freq: Once | INTRAMUSCULAR | Status: AC
  Administered 2022-12-05: 8 mL
  Filled 2022-12-05: qty 10

## 2022-12-05 NOTE — Discharge Instructions (Signed)
Kyphoplasty Post Procedure Discharge Instructions  May resume a regular diet and any medications that you routinely take (including pain medications). However, if you are taking Aspirin or an anticoagulant/blood thinner you will be told when you can resume taking these by the healthcare provider. No driving day of procedure. The day of your procedure take it easy. You may use an ice pack as needed to injection sites on back.  Ice to back 30 minutes on and 30 minutes off, as needed. May remove bandaids tomorrow after taking a shower. Replace daily with a clean bandaid until healed.  Do not lift anything heavier than a milk jug for 1-2 weeks or determined by your physician.  Follow up with your physician in 2 weeks.    Please contact our office at [phone] for the following symptoms or if you have any questions:  Fever greater than 100 degrees Increased swelling, pain, or redness at injection site. Increased back and/or leg pain New numbness or change in symptoms from before the procedure.    Thank you for visiting [facility]. 

## 2022-12-05 NOTE — Procedures (Signed)
Interventional Radiology Procedure Note  Date of Procedure: 12/05/2022  Procedure: CT BMBx, L1 bone biopsy   Findings:  1. CT BMBx right posterior ilium  2. CT L1 bone lesion core needle biopsy, 11ga single pass    Complications: No immediate complications noted.   Estimated Blood Loss: minimal  Follow-up and Recommendations: 1. Bedrest 1 hour    Olive Bass, MD  Vascular & Interventional Radiology  12/05/2022 10:04 AM

## 2022-12-05 NOTE — H&P (Signed)
Chief Complaint: Patient was seen in consultation today for L1 lesion and suspicion for multiple myeloma  Referring Physician(s): Pernell Dupre  Supervising Physician: Pernell Dupre  Patient Status: [facility] - Out-pt  History of Present Illness: Melvin Gilmore is a 82 y.o. male with PMH significant for anxiety, GERD, hypertension, and prostate cancer being seen today for L1 lesion and suspicion for multiple myeloma. Patient reports that he had a forceful sneeze in mid-April that caused acute back pain. Follow-up imaging revealed the presence of a T12 compression fracture and L1 lesion. Patient was subsequently seen by both Dr Juliette Alcide from IR and Dr Alena Bills from Oncology. There is suspicion for multiple myeloma over recurrence of prostate cancer as patient's PSA levels are within normal limits. Biopsy of L1 lesion and bone marrow biopsy have been requested.  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   Prostate cancer (HCC) 2023   Seasonal allergies    Skin cancer    a.) ears   Wears partial dentures    lower    Past Surgical History:  Procedure Laterality Date   APPENDECTOMY     CATARACT EXTRACTION W/PHACO Right 01/15/2016   Procedure: CATARACT EXTRACTION PHACO AND INTRAOCULAR LENS PLACEMENT (IOC);  Surgeon: Sherald Hess, MD;  Location: [facility];  Service: Ophthalmology;  Laterality: Right;  RIGHT CALL CELL PHONE WITH TIME   CHOLECYSTECTOMY     COLONOSCOPY     COLONOSCOPY WITH ESOPHAGOGASTRODUODENOSCOPY (EGD)      ESOPHAGOGASTRODUODENOSCOPY  07/2021   ESOPHAGOGASTRODUODENOSCOPY (EGD) WITH PROPOFOL N/A 09/23/2017   Procedure: ESOPHAGOGASTRODUODENOSCOPY (EGD) WITH PROPOFOL;  Surgeon: Toledo, Boykin Nearing, MD;  Location: [facility];  Service: Gastroenterology;  Laterality: N/A;   HIGH INTENSITY FOCUSED ULTRASOUND (HIFU) OF THE PROSTATE N/A 02/07/2022   Procedure: HIGH INTENSITY FOCUSED ULTRASOUND (HIFU) OF THE PROSTATE;  Surgeon: Orson Ape, MD;  Location: [facility];  Service: Urology;  Laterality: N/A;   HYDROCELE EXCISION / REPAIR     IR RADIOLOGIST EVAL & MGMT  11/21/2022   LASIX RT EYE     PROSTATE BIOPSY N/A 01/03/2022   Procedure: PROSTATE BIOPSY  Addison Jahnke;  Surgeon: Orson Ape, MD;  Location: [facility];  Service: Urology;  Laterality: N/A;   ROTATOR CUFF REPAIR Left 2001    Allergies: Amoxicillin-pot clavulanate and Meloxicam  Medications: Prior to Admission medications   Medication Sig Start Date End Date Taking? Authorizing Provider  acetaminophen (TYLENOL) 500 MG tablet Take 500 mg by mouth every 6 (six) hours as needed.    [provider]  ALPRAZolam Prudy Feeler) 0.5 MG tablet Take 0.25-0.5 mg by mouth at bedtime as needed for anxiety.    [provider]  cetirizine (ZYRTEC) 10 MG tablet Take 10 mg by mouth as needed for allergies.    [provider]  ciprofloxacin (CIPRO) 500 MG tablet Take 1 tablet (500 mg total) by mouth 2 (two) times daily. 02/07/22   Orson Ape, MD  docusate sodium (COLACE) 100 MG capsule Take 2 capsules (200 mg total) by mouth 2 (two) times daily. 02/07/22   Orson Ape, MD  fluticasone (FLONASE) 50 MCG/ACT nasal spray Place 1 spray into both nostrils daily.    [provider]  HYDROcodone-acetaminophen (NORCO/VICODIN) 5-325 MG tablet Take 1 tablet by mouth every 6 (six) hours as needed for severe pain. 12/03/22 01/02/23  [name], MD  levothyroxine (SYNTHROID, LEVOTHROID) 50 MCG tablet Take 50 mcg by mouth daily before  breakfast.    [provider]  omeprazole (PRILOSEC) 40 MG capsule Take 40 mg by mouth every morning.    [provider]  Plant Sterols and Stanols (CHOLESTOFF) 450 MG TABS Take 1 tablet by mouth every morning.    [provider]  Probiotic Product (PROBIOTIC ADVANCED PO) Take 1 tablet by mouth at bedtime.    [provider]  tadalafil (CIALIS) 5 MG tablet Take 5 mg by mouth daily as needed for erectile dysfunction.    [provider]  tamsulosin (FLOMAX) 0.4 MG CAPS capsule Take 0.4 mg by mouth daily after supper. Patient not taking: Reported on 11/21/2022    [provider]  traZODone (DESYREL) 50 MG tablet  10/31/22   [provider]     Family History  Problem Relation Age of Onset   Pneumonia Father     Social History   Socioeconomic History   Marital status: Married    Spouse name: Not on file   Number of children: Not on file   Years of education: Not on file   Highest education level: Not on file  Occupational History   Not on file  Tobacco Use   Smoking status: Never   Smokeless tobacco: Never  Vaping Use   Vaping Use: Never used  Substance and Sexual Activity   Alcohol use: Yes    Alcohol/week: 7.0 standard drinks of alcohol    Types: 7 Glasses of wine per week    Comment: wine occ   Drug use: No   Sexual activity: Not on file  Other Topics Concern   Not on file  Social History Narrative   Not on file   Social Determinants of Health   Financial Resource Strain: Low Risk  (11/21/2022)   Overall Financial Resource Strain (CARDIA)    Difficulty of Paying Living Expenses: Not hard at all  Food Insecurity: No Food Insecurity (11/21/2022)   Hunger Vital Sign    Worried About Running Out of Food in the Last Year: Never true    Ran Out of Food in the Last Year: Never true  Transportation Needs: Not on file  Physical Activity: Not on file  Stress: Not on file  Social Connections: Not on file    Code  Status: Full code  Review of Systems: A 12 point ROS discussed and pertinent positives are indicated in the HPI above.  All other systems are negative.  Review of Systems  Constitutional:  Negative for chills and fever.  Respiratory:  Negative for chest tightness and shortness of breath.   Cardiovascular:  Negative for chest pain and leg swelling.  Gastrointestinal:  Negative for abdominal pain, diarrhea, nausea and vomiting.  Musculoskeletal:  Positive for back pain.  Neurological:  Negative for dizziness and headaches.  Psychiatric/Behavioral:  Negative for confusion.     Vital Signs: BP (!) 160/84   Pulse 74   Temp 98 F (36.7 C)   Resp 16   Ht 5\' 11"  (1.803 m)  Wt 146 lb (66.2 kg)   SpO2 97%   BMI 20.36 kg/m    Physical Exam Vitals reviewed.  Constitutional:      General: He is not in acute distress.    Appearance: He is not ill-appearing.  Eyes:     Pupils: Pupils are equal, round, and reactive to light.  Cardiovascular:     Rate and Rhythm: Normal rate and regular rhythm.     Pulses: Normal pulses.     Heart sounds: Normal heart sounds.  Pulmonary:     Effort: Pulmonary effort is normal.     Breath sounds: Normal breath sounds.  Abdominal:     Palpations: Abdomen is soft.     Tenderness: There is no abdominal tenderness.  Musculoskeletal:     Right lower leg: No edema.     Left lower leg: No edema.  Skin:    General: Skin is warm and dry.  Neurological:     Mental Status: He is alert and oriented to person, place, and time.  Psychiatric:        Mood and Affect: Mood normal.        Behavior: Behavior normal.        Thought Content: Thought content normal.        Judgment: Judgment normal.     Imaging: MR Lumbar Spine W Wo Contrast  Result Date: 11/29/2022 CLINICAL DATA:  Compression fracture, thoracic, known malignancy compression fracture/ new lesion; compression fracture/ new lesion. EXAM: MRI THORACIC AND LUMBAR SPINE WITHOUT AND WITH CONTRAST  TECHNIQUE: Multiplanar and multiecho pulse sequences of the thoracic and lumbar spine were obtained without and with intravenous contrast. CONTRAST:  6mL GADAVIST GADOBUTROL 1 MMOL/ML IV SOLN COMPARISON:  MRI thoracic spine 11/15/2022. MRI lumbar spine 03/08/2006. FINDINGS: MRI THORACIC SPINE FINDINGS Alignment:  Normal. Vertebrae: Unchanged subacute compression fracture of the T12 vertebral body with mild height loss. Focal enhancing, marrow replacing lesions in the right pedicle of T6 (sagittal image 5 series 24), right inferior endplate T8 (sagittal image 6 series 24), left inferior endplate of T5 (sagittal image 11 series 24), and left pedicle of T2 (sagittal image 13 series 24), all suspicious for metastatic disease. No epidural or paraspinal extension. Cord: Normal spinal cord signal and volume. No abnormal enhancement. Paraspinal and other soft tissues: Unchanged chronic scarring in the lung apices. Disc levels: Unchanged small disc bulge and facet arthropathy contributing to mild right neural foraminal narrowing at T9-10. Unchanged disc bulge and facet arthropathy at T10-11 resulting in mild spinal canal stenosis. MRI LUMBAR SPINE FINDINGS Segmentation:  Standard. Alignment:  Normal. Vertebrae: Multiple focal enhancing, marrow replacing lesions in the L1 vertebral body, measuring up to 16 mm (sagittal image 11 series 6). Smaller enhancing, marrow replacing lesions in the superior endplate of L2 (image 8 series 6) and superior endplate of L5 (image 7 series 6), again suspicious for metastatic disease. No epidural or paraspinal extension. Conus medullaris: Extends to the L1 level and appears normal. No abnormal enhancement of the conus or cauda equina nerve roots. Paraspinal and other soft tissues: Unremarkable. Disc levels: T12-L1:  Unremarkable. L1-L2:  Unremarkable. L2-L3: Disc bulge and facet arthropathy results in mild spinal canal stenosis. No significant neural foraminal narrowing. L3-L4: Disc bulge and  facet arthropathy results in moderate spinal canal stenosis with compression of the traversing right L4 nerve root in the right lateral recess. Mild bilateral neural foraminal narrowing. L4-L5: Disc bulge and facet arthropathy results in mild spinal canal stenosis with compression of the  traversing right L5 nerve root in the right lateral recess. Moderate left and mild right neural foraminal narrowing. L5-S1: Disc bulge and facet arthropathy results in compression of the traversing S1 nerve roots in the lateral recesses bilaterally. No significant neural foraminal narrowing. IMPRESSION: 1. Multiple enhancing, marrow replacing lesions in the thoracic and lumbar spine, suspicious for metastatic disease. No epidural or paraspinal extension. 2. Multilevel degenerative changes in the lumbar spine, worst at L3-L4 where there is moderate spinal canal stenosis and compression of the traversing right L4 nerve root in the right lateral recess. 3. At L4-L5, mild spinal canal stenosis with compression of the traversing right L5 nerve root in the right lateral recess. Moderate left neural foraminal narrowing. 4. At L5-S1, compression of the traversing S1 nerve roots in the lateral recesses bilaterally. 5. Unchanged subacute compression fracture of the T12 vertebral body with mild height loss. Electronically Signed   By: Orvan Falconer M.D.   On: 11/29/2022 14:58   MR Thoracic Spine W Wo Contrast  Result Date: 11/29/2022 CLINICAL DATA:  Compression fracture, thoracic, known malignancy compression fracture/ new lesion; compression fracture/ new lesion. EXAM: MRI THORACIC AND LUMBAR SPINE WITHOUT AND WITH CONTRAST TECHNIQUE: Multiplanar and multiecho pulse sequences of the thoracic and lumbar spine were obtained without and with intravenous contrast. CONTRAST:  6mL GADAVIST GADOBUTROL 1 MMOL/ML IV SOLN COMPARISON:  MRI thoracic spine 11/15/2022. MRI lumbar spine 03/08/2006. FINDINGS: MRI THORACIC SPINE FINDINGS Alignment:   Normal. Vertebrae: Unchanged subacute compression fracture of the T12 vertebral body with mild height loss. Focal enhancing, marrow replacing lesions in the right pedicle of T6 (sagittal image 5 series 24), right inferior endplate T8 (sagittal image 6 series 24), left inferior endplate of T5 (sagittal image 11 series 24), and left pedicle of T2 (sagittal image 13 series 24), all suspicious for metastatic disease. No epidural or paraspinal extension. Cord: Normal spinal cord signal and volume. No abnormal enhancement. Paraspinal and other soft tissues: Unchanged chronic scarring in the lung apices. Disc levels: Unchanged small disc bulge and facet arthropathy contributing to mild right neural foraminal narrowing at T9-10. Unchanged disc bulge and facet arthropathy at T10-11 resulting in mild spinal canal stenosis. MRI LUMBAR SPINE FINDINGS Segmentation:  Standard. Alignment:  Normal. Vertebrae: Multiple focal enhancing, marrow replacing lesions in the L1 vertebral body, measuring up to 16 mm (sagittal image 11 series 6). Smaller enhancing, marrow replacing lesions in the superior endplate of L2 (image 8 series 6) and superior endplate of L5 (image 7 series 6), again suspicious for metastatic disease. No epidural or paraspinal extension. Conus medullaris: Extends to the L1 level and appears normal. No abnormal enhancement of the conus or cauda equina nerve roots. Paraspinal and other soft tissues: Unremarkable. Disc levels: T12-L1:  Unremarkable. L1-L2:  Unremarkable. L2-L3: Disc bulge and facet arthropathy results in mild spinal canal stenosis. No significant neural foraminal narrowing. L3-L4: Disc bulge and facet arthropathy results in moderate spinal canal stenosis with compression of the traversing right L4 nerve root in the right lateral recess. Mild bilateral neural foraminal narrowing. L4-L5: Disc bulge and facet arthropathy results in mild spinal canal stenosis with compression of the traversing right L5 nerve  root in the right lateral recess. Moderate left and mild right neural foraminal narrowing. L5-S1: Disc bulge and facet arthropathy results in compression of the traversing S1 nerve roots in the lateral recesses bilaterally. No significant neural foraminal narrowing. IMPRESSION: 1. Multiple enhancing, marrow replacing lesions in the thoracic and lumbar spine, suspicious for metastatic disease.  No epidural or paraspinal extension. 2. Multilevel degenerative changes in the lumbar spine, worst at L3-L4 where there is moderate spinal canal stenosis and compression of the traversing right L4 nerve root in the right lateral recess. 3. At L4-L5, mild spinal canal stenosis with compression of the traversing right L5 nerve root in the right lateral recess. Moderate left neural foraminal narrowing. 4. At L5-S1, compression of the traversing S1 nerve roots in the lateral recesses bilaterally. 5. Unchanged subacute compression fracture of the T12 vertebral body with mild height loss. Electronically Signed   By: Orvan Falconer M.D.   On: 11/29/2022 14:58   IR Radiologist Eval & Mgmt  Result Date: 11/21/2022 EXAM: NEW PATIENT OFFICE VISIT CHIEF COMPLAINT: Refer to EMR HISTORY OF PRESENT ILLNESS: The patient reports initial onset of vague pain that started several weeks ago, with recent sudden exacerbation approximately 2 weeks ago after a forceful sneeze in the backyard. Patient went to initially get an x-ray which was unremarkable, followed by a thoracic spine MRI demonstrated a subacute T12 compression fracture. Additional note was made of an indeterminate lesion and L1. The patient does have a history of recent prostate cancer that was treated with high-frequency ultrasound in 2023. No report of metastatic disease at time of diagnosis or treatment. Recent PSA scores have been negative. Note, patient was saw referring oncologist Dr. Alena Bills earlier today. Currently has PSA level and additional contrast-enhanced MRI of the  thoracic and lumbar spine pending. The patient reports there has been mild improvement in back pain since injury. However he still reports moderate at least 5/10 back pain with movement. He takes over-the-counter Tylenol/ibuprofen approximately 4 times a day for his back pain. He reports moderate disability on the [employer] disability questionnaire with 14/24 positive. He reports slowing in his mobility and inability to do his normal chores around the house because of his back pain. He is only able to stand and walk for short periods of time. His back pain has also affected his sleep and appetite. Personally reviewed for bone density purposes. The patient is osteopenic. REVIEW OF SYSTEMS: Refer to EMR PHYSICAL EXAMINATION: Refer to EMR ASSESSMENT AND PLAN: Refer to EMR Electronically Signed   By: Olive Bass M.D.   On: 11/21/2022 16:34   MR THORACIC SPINE WO CONTRAST  Result Date: 11/15/2022 CLINICAL DATA:  Mid back pain times several weeks. EXAM: MRI THORACIC SPINE WITHOUT CONTRAST TECHNIQUE: Multiplanar, multisequence MR imaging of the thoracic spine was performed. No intravenous contrast was administered. COMPARISON:  None Available. FINDINGS: Alignment:  Normal. Vertebrae: Compression fracture of the T12 superior endplate with hypointense line and mild surrounding marrow edema. 15 mm T1 hypointense marrow lesion with slight T2 hyperintensity on STIR in the anterior aspect of the L1 vertebral body (sagittal images 11, series 20 and 21). Cord:  Normal spinal cord signal and volume. Paraspinal and other soft tissues: Chronic scarring in the lung apices. No acute or significant findings. Disc levels: Small disc bulge and facet arthropathy contribute to mild right neural foraminal narrowing at T9-10. Disc bulge and facet arthropathy at T10-11 results in mild spinal canal stenosis. IMPRESSION: 1. Compression fracture of the T12 superior endplate with mild surrounding marrow edema, likely subacute. 2. 15 mm  T1 hypointense marrow lesion in the anterior aspect of the L1 vertebral body, concerning for metastatic disease. Further characterization with postcontrast MRI of the thoracic spine is recommended. 3. Mild degenerative changes, with mild spinal canal stenosis at T10-11 and mild right neural foraminal  narrowing at T9-10. Electronically Signed   By: Orvan Falconer M.D.   On: 11/15/2022 15:22    Labs:  CBC: Recent Labs    12/05/22 0746  WBC 4.7  HGB 12.5*  HCT 36.0*  PLT 159    COAGS: No results for input(s): "INR", "APTT" in the last 8760 hours.  BMP: Recent Labs    02/07/22 0631  NA 133*  K 3.7  CL 100  CO2 26  GLUCOSE 93  BUN 13  CALCIUM 9.5  CREATININE 1.05  GFRNONAA >60    LIVER FUNCTION TESTS: No results for input(s): "BILITOT", "AST", "ALT", "ALKPHOS", "PROT", "ALBUMIN" in the last 8760 hours.  TUMOR MARKERS: No results for input(s): "AFPTM", "CEA", "CA199", "CHROMGRNA" in the last 8760 hours.  Assessment and Plan:  Melvin Gilmore is an 82 yo male being seen today in relation to newly discovered L1 lesion and suspicion for multiple myeloma. Patient is being followed by Dr Alena Bills from Oncology service who has requested image-guided L1 lesion biopsy and bone marrow biopsy. Case has been reviewed and approved by Dr Juliette Alcide and is scheduled to proceed on 12/05/22.  Risks and benefits of image-guided L1 biopsy and image-guided bone marrow biopsy were discussed with the patient and/or patient's family including, but not limited to bleeding, infection, damage to adjacent structures or low yield requiring additional tests.  All of the questions were answered and there is agreement to proceed.  Consent signed and in chart.   Thank you for this interesting consult.  I greatly enjoyed meeting Melvin Gilmore and look forward to participating in their care.  A copy of this report was sent to the requesting provider on this date.  Electronically Signed: Kennieth Francois,  PA-C 12/05/2022, 8:24 AM   I spent a total of  30 Minutes   in face to face in clinical consultation, greater than 50% of which was counseling/coordinating care for L1 lesion and suspicion for multiple myeloma.

## 2022-12-06 ENCOUNTER — Ambulatory Visit: Payer: Medicare HMO | Admitting: Internal Medicine

## 2022-12-06 ENCOUNTER — Ambulatory Visit
Admission: RE | Admit: 2022-12-06 | Discharge: 2022-12-06 | Disposition: A | Discharge status: Home / Self Care | Payer: Medicare HMO | Source: Ambulatory Visit | Attending: Internal Medicine | Admitting: Internal Medicine

## 2022-12-06 DIAGNOSIS — M8588 Other specified disorders of bone density and structure, other site: Secondary | ICD-10-CM

## 2022-12-06 DIAGNOSIS — S22080A Wedge compression fracture of T11-T12 vertebra, initial encounter for closed fracture: Secondary | ICD-10-CM

## 2022-12-06 HISTORY — PX: IR KYPHO THORACIC WITH BONE BIOPSY: IMG5518

## 2022-12-06 MED ORDER — ACETAMINOPHEN 10 MG/ML IV SOLN: 1000.0000 mg | Freq: Once | INTRAVENOUS | Status: DC

## 2022-12-06 MED ORDER — FENTANYL CITRATE PF 50 MCG/ML IJ SOSY
25.0000 ug | PREFILLED_SYRINGE | INTRAMUSCULAR | Status: DC | PRN
  Administered 2022-12-06 (×3): 25 ug via INTRAVENOUS

## 2022-12-06 MED ORDER — MIDAZOLAM HCL 2 MG/2ML IJ SOLN
1.0000 mg | INTRAMUSCULAR | Status: DC | PRN
  Administered 2022-12-06: 1 mg via INTRAVENOUS
  Administered 2022-12-06: 0.5 mg via INTRAVENOUS
  Administered 2022-12-06: 1 mg via INTRAVENOUS

## 2022-12-06 MED ORDER — SODIUM CHLORIDE 0.9 % IV SOLN: INTRAVENOUS | Status: DC

## 2022-12-06 MED ORDER — VANCOMYCIN HCL IN DEXTROSE 1-5 GM/200ML-% IV SOLN
1000.0000 mg | INTRAVENOUS | Status: AC
  Administered 2022-12-06: 1000 mg via INTRAVENOUS

## 2022-12-06 NOTE — Progress Notes (Signed)
Pt back in nursing recovery area. Pt still drowsy from procedure but will wake up when spoken to. Pt follows commands, talks in complete sentences and has no complaints at this time. Pt will remain in nursing station until discharge.  ?

## 2022-12-10 LAB — SURGICAL PATHOLOGY

## 2022-12-12 ENCOUNTER — Telehealth: Payer: Self-pay | Admitting: *Deleted

## 2022-12-12 LAB — SURGICAL PATHOLOGY

## 2022-12-12 NOTE — Telephone Encounter (Signed)
Patient wife called reporting that he has an appointment tomorrow, but he is having difficulty with his right hip and she is asking what they can do for him between now and his appointment tomorrow to make him more comfortable. She states that he is using Hydrocodone  and a combination of tylenol and Advil for the pain right ow. They have not tried ice or heat. She said that when he stands from sitting position the hip almost grabs him. Please advise

## 2022-12-13 ENCOUNTER — Other Ambulatory Visit: Payer: Self-pay

## 2022-12-13 ENCOUNTER — Inpatient Hospital Stay (HOSPITAL_BASED_OUTPATIENT_CLINIC_OR_DEPARTMENT_OTHER): Payer: Medicare HMO | Admitting: Internal Medicine

## 2022-12-13 ENCOUNTER — Encounter: Payer: Self-pay | Admitting: Internal Medicine

## 2022-12-13 ENCOUNTER — Other Ambulatory Visit: Payer: Self-pay | Admitting: Internal Medicine

## 2022-12-13 ENCOUNTER — Ambulatory Visit
Admission: RE | Admit: 2022-12-13 | Discharge: 2022-12-13 | Disposition: A | Discharge status: Home / Self Care | Payer: Medicare HMO | Attending: Internal Medicine | Admitting: Internal Medicine

## 2022-12-13 ENCOUNTER — Inpatient Hospital Stay: Payer: Medicare HMO

## 2022-12-13 ENCOUNTER — Encounter (HOSPITAL_COMMUNITY): Payer: Self-pay | Admitting: Internal Medicine

## 2022-12-13 ENCOUNTER — Ambulatory Visit
Admission: RE | Admit: 2022-12-13 | Discharge: 2022-12-13 | Disposition: A | Discharge status: Home / Self Care | Payer: Medicare HMO | Source: Ambulatory Visit | Attending: Internal Medicine | Admitting: Internal Medicine

## 2022-12-13 VITALS — BP 133/73 | HR 78 | Temp 97.7°F | Wt 146.3 lb

## 2022-12-13 DIAGNOSIS — C9000 Multiple myeloma not having achieved remission: Secondary | ICD-10-CM

## 2022-12-13 DIAGNOSIS — C9002 Multiple myeloma in relapse: Secondary | ICD-10-CM | POA: Insufficient documentation

## 2022-12-13 DIAGNOSIS — M25551 Pain in right hip: Secondary | ICD-10-CM

## 2022-12-13 DIAGNOSIS — M4854XA Collapsed vertebra, not elsewhere classified, thoracic region, initial encounter for fracture: Secondary | ICD-10-CM | POA: Diagnosis not present

## 2022-12-13 LAB — COMPREHENSIVE METABOLIC PANEL
ALT: 17 U/L (ref 0–44)
AST: 23 U/L (ref 15–41)
Albumin: 3.6 g/dL (ref 3.5–5.0)
Alkaline Phosphatase: 73 U/L (ref 38–126)
Anion gap: 12 (ref 5–15)
BUN: 20 mg/dL (ref 8–23)
CO2: 27 mmol/L (ref 22–32)
Calcium: 9.5 mg/dL (ref 8.9–10.3)
Chloride: 93 mmol/L — ABNORMAL LOW (ref 98–111)
Creatinine, Ser: 1.04 mg/dL (ref 0.61–1.24)
GFR, Estimated: >60 mL/min (ref >60)
Glucose, Bld: 104 mg/dL — ABNORMAL HIGH (ref 70–99)
Potassium: 4.8 mmol/L (ref 3.5–5.1)
Sodium: 132 mmol/L — ABNORMAL LOW (ref 135–145)
Total Bilirubin: 0.5 mg/dL (ref 0.3–1.2)
Total Protein: 9.2 g/dL — ABNORMAL HIGH (ref 6.5–8.1)

## 2022-12-13 LAB — LACTATE DEHYDROGENASE: LDH: 126 U/L (ref 98–192)

## 2022-12-13 MED ORDER — ACYCLOVIR 400 MG PO TABS
400.0000 mg | ORAL_TABLET | Freq: Two times a day (BID) | ORAL | 11 refills | Status: DC
Start: 2022-12-13 — End: 2022-12-17

## 2022-12-13 MED ORDER — SENNA 8.6 MG PO TABS: 2.0000 | ORAL_TABLET | Freq: Every day | ORAL | 2 refills | Status: AC | PRN

## 2022-12-13 MED ORDER — LIDOCAINE 5 % EX PTCH: 1.0000 | MEDICATED_PATCH | CUTANEOUS | 0 refills | Status: AC

## 2022-12-13 MED ORDER — ASPIRIN 81 MG PO TBEC: 81.0000 mg | DELAYED_RELEASE_TABLET | Freq: Every day | ORAL | 2 refills | Status: AC

## 2022-12-13 NOTE — Patient Instructions (Addendum)
I will send script for lidocaine patch. Please put on the right hip.  Take Norco 1 tablet every 4 hours as needed I will sent script for Sennakot two tablets at night as needed.  Take Tylenol 1 tab every 6 hours standing   Start taking baby aspirin and Acyclovir on May 30th

## 2022-12-13 NOTE — Progress Notes (Signed)
Pt. Is having severe right lower back pain, rates his pain at a 10. He says it is a stabbing pain, ongoing for about 4 weeks now. Pt is looking to find something to help relieve him from his pain. He is unable to sleep.

## 2022-12-13 NOTE — Progress Notes (Unsigned)
[facility] CONSULT NOTE  Patient Care Team: [name], MD as PCP - General (Internal Medicine)  REFERRING PROVIDER: Ignacia Bayley, PA  REASON FOR REFFERAL: Abnormal T-spine MRI  CANCER STAGING   Cancer Staging  No matching staging information was found for the patient.  ASSESSMENT & PLAN:  Melvin Gilmore 82 y.o. male with pmh of hypertension, hyperlipidemia, anxiety, GERD, BPH, hypothyroidism, prostate cancer s/p HIFU was seen by primary on November 13, 2022 for acute onset of lower back pain.  Was referred to medical oncology for finding of L1 lesion suspicious for metastasis.  # Multiple myeloma -MRI thoracic and lumbar spine with and without contrast was reviewed. Showed unchanged subacute compression fracture at T12.  Focal enhancing marrow replacing lesions in T6, T8, T5, T2 all suspicious for metastatic disease.  L1 lesion measuring 16 mm.  Multilevel degenerative changes present.  -SPEP/IFE showed biclonal IgA kappa paraprotein with M spike 2.1 g/dL and second spike at 0.4 g/dL.  Kappa 453, lambda 6.8 with a ratio of 66.7.  Iron panel and B12 are normal.  PSA 3.44.  CBC showed mild anemia 13.4, chronic.  On CMP normal creatinine and calcium levels.  Elevated total protein 8.8.  -BMBx Hypercellular marrow with 60% plasma cells.  IHC positive for CD138/ mum 1.  Kappa restricted consistent with multiple myeloma. L1 vertebral body biopsy showed numerous plasma cells. Cytogenetics - del (13q) and gain 1.    - Obtain LDH, beta-2 microglobulin to get information for staging.  Scheduled for PET/CT scan to complete staging.  I will also obtain 24-hour urine for UPEP/immunofixation as a baseline.  -I discussed in detail with the patient and wife about the diagnosis, further workup needed for staging and treatment.  Considering his age, he would not be a candidate for auto transplant.  Discussed about treatment regimen with Dara RD based on MAIA trial and transplant in  eligible patients.  # History of prostate cancer - follows with Dr. Evelene Croon, urology for elevated PSA.  MRI prostate showed 1 mm category 5 lesion of anterior transition zone.  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 s/p HIFU of the prostate on 02/07/2022.  PSA level from 10/08/2022 was 3.34.  -PSA from 11/21/2022 3.44.  # Normocytic anemia -Chronic, hemoglobin 13.4.   -Iron panel, B12 and folate normal.   No orders of the defined types were placed in this encounter. RTC in 1 week for MD visit to discuss biopsy results.   The total time spent in the appointment was 30 minutes encounter with patients including review of chart and various tests results, discussions about plan of care and coordination of care plan   All questions were answered. The patient knows to call the clinic with any problems, questions or concerns. No barriers to learning was detected.  [name], MD 5/24/20241:47 PM   HISTORY OF PRESENTING ILLNESS:  Melvin Gilmore 82 y.o. male with past medical history of hypertension, hyperlipidemia, anxiety, GERD, BPH, hypothyroidism, prostate cancer s/p HIFU was seen by primary on November 13, 2022 for acute onset of lower back pain.  Patient reports that he was sitting outside and had violent sneeze which was followed by severe back pain.  He has fallen allergies.  Had x-rays followed by MRI thoracic spine without contrast.  It showed compression fracture of T12 superior endplate with surrounding marrow edema likely subacute.  15 mm T1 hypointense marrow lesion in the anterior aspect of L1  suspicious for metastasis.  Further characterization with postcontrast MRI recommended.  Prostate cancer-follows with Dr. Evelene Croon, urology for elevated PSA.  MRI prostate showed 1 mm category 5 lesion of anterior transition zone.  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 s/p HIFU of  the prostate on 02/07/2022.  PSA level from 10/08/2022 was 3.34.  Interval history Patient seen today accompanied with his wife to discuss MRI spine results and next steps of treatment.  Since the last visit, his mid back pain has worsened and does not happen to find a position where he feels comfortable.  He is taking Tylenol and ibuprofen which is not helping.  I have reviewed his chart and materials related to his cancer extensively and collaborated history with the patient. Summary of oncologic history is as follows: Oncology History   No history exists.    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   Prostate cancer (HCC) 2023   Seasonal allergies    Skin cancer    a.) ears   Wears partial dentures    lower    SURGICAL HISTORY: Past Surgical History:  Procedure Laterality Date   APPENDECTOMY     CATARACT EXTRACTION W/PHACO Right 01/15/2016   Procedure: CATARACT EXTRACTION PHACO AND INTRAOCULAR LENS PLACEMENT (IOC);  Surgeon: Sherald Hess, MD;  Location: [facility];  Service: Ophthalmology;  Laterality: Right;  RIGHT CALL CELL PHONE WITH TIME   CHOLECYSTECTOMY     COLONOSCOPY     COLONOSCOPY WITH ESOPHAGOGASTRODUODENOSCOPY (EGD)     ESOPHAGOGASTRODUODENOSCOPY  07/2021   ESOPHAGOGASTRODUODENOSCOPY (EGD) WITH PROPOFOL N/A 09/23/2017   Procedure: ESOPHAGOGASTRODUODENOSCOPY (EGD) WITH PROPOFOL;  Surgeon: Toledo, Boykin Nearing, MD;  Location: [facility];  Service: Gastroenterology;  Laterality: N/A;   HIGH  INTENSITY FOCUSED ULTRASOUND (HIFU) OF THE PROSTATE N/A 02/07/2022   Procedure: HIGH INTENSITY FOCUSED ULTRASOUND (HIFU) OF THE PROSTATE;  Surgeon: Orson Ape, MD;  Location: [facility];  Service: Urology;  Laterality: N/A;   HYDROCELE EXCISION / REPAIR     IR KYPHO THORACIC WITH BONE BIOPSY  12/06/2022   IR RADIOLOGIST EVAL & MGMT  11/21/2022   LASIX RT EYE     PROSTATE BIOPSY N/A 01/03/2022   Procedure: PROSTATE BIOPSY  Addison Mundorf;  Surgeon: Orson Ape, MD;  Location: [facility];  Service: Urology;  Laterality: N/A;   ROTATOR CUFF REPAIR Left 2001    SOCIAL HISTORY: Social History   Socioeconomic History   Marital status: Married    Spouse name: Not on file   Number of children: Not on file   Years of education: Not on file   Highest education level: Not on file  Occupational History   Not on file  Tobacco Use   Smoking status: Never   Smokeless tobacco: Never  Vaping Use   Vaping Use: Never used  Substance and Sexual Activity   Alcohol use: Yes    Alcohol/week: 7.0  standard drinks of alcohol    Types: 7 Glasses of wine per week    Comment: wine occ   Drug use: No   Sexual activity: Not on file  Other Topics Concern   Not on file  Social History Narrative   Not on file   Social Determinants of Health   Financial Resource Strain: Low Risk  (11/21/2022)   Overall Financial Resource Strain (CARDIA)    Difficulty of Paying Living Expenses: Not hard at all  Food Insecurity: No Food Insecurity (11/21/2022)   Hunger Vital Sign    Worried About Running Out of Food in the Last Year: Never true    Ran Out of Food in the Last Year: Never true  Transportation Needs: Not on file  Physical Activity: Not on file  Stress: Not on file  Social Connections: Not on file  Intimate Partner Violence: Not At Risk (11/21/2022)   Humiliation, Afraid, Rape, and Kick questionnaire    Fear of Current or Ex-Partner: No    Emotionally Abused: No    Physically Abused: No    Sexually Abused: No     FAMILY HISTORY: Family History  Problem Relation Age of Onset   Pneumonia Father     ALLERGIES:  is allergic to amoxicillin-pot clavulanate and meloxicam.  MEDICATIONS:  Current Outpatient Medications  Medication Sig Dispense Refill   acetaminophen (TYLENOL) 500 MG tablet Take 500 mg by mouth every 6 (six) hours as needed.     ALPRAZolam (XANAX) 0.5 MG tablet Take 0.25-0.5 mg by mouth at bedtime as needed for anxiety.     cetirizine (ZYRTEC) 10 MG tablet Take 10 mg by mouth as needed for allergies.     ciprofloxacin (CIPRO) 500 MG tablet Take 1 tablet (500 mg total) by mouth 2 (two) times daily. (Patient not taking: Reported on 12/05/2022) 14 tablet 0   docusate sodium (COLACE) 100 MG capsule Take 2 capsules (200 mg total) by mouth 2 (two) times daily. 120 capsule 3   fluticasone (FLONASE) 50 MCG/ACT nasal spray Place 1 spray into both nostrils daily.     HYDROcodone-acetaminophen (NORCO/VICODIN) 5-325 MG tablet Take 1 tablet by mouth every 6 (six) hours as needed for severe pain. 90 tablet 0   levothyroxine (SYNTHROID, LEVOTHROID) 50 MCG tablet Take 50 mcg by mouth daily before breakfast.     omeprazole (PRILOSEC) 40 MG capsule Take 40 mg by mouth every morning.     Plant Sterols and Stanols (CHOLESTOFF) 450 MG TABS Take 1 tablet by mouth every morning.     Probiotic Product (PROBIOTIC ADVANCED PO) Take 1 tablet by mouth at bedtime.     tadalafil (CIALIS) 5 MG tablet Take 5 mg by mouth daily as needed for erectile dysfunction.     tamsulosin (FLOMAX) 0.4 MG CAPS capsule Take 0.4 mg by mouth daily after supper. (Patient not taking: Reported on 11/21/2022)     traZODone (DESYREL) 50 MG tablet  (Patient not taking: Reported on 11/21/2022)     No current facility-administered medications for this visit.    REVIEW OF SYSTEMS:   Pertinent information mentioned in HPI All other systems were reviewed with the patient and are negative.  PHYSICAL EXAMINATION: ECOG PERFORMANCE STATUS: 1 -  Symptomatic but completely ambulatory  There were no vitals filed for this visit.  There were no vitals filed for this visit.   GENERAL:alert, no distress and comfortable SKIN: skin color, texture, turgor are normal, no rashes or significant lesions EYES: normal, conjunctiva are pink and  non-injected, sclera clear OROPHARYNX:no exudate, no erythema and lips, buccal mucosa, and tongue normal  NECK: supple, thyroid normal size, non-tender, without nodularity LYMPH:  no palpable lymphadenopathy in the cervical, axillary or inguinal LUNGS: clear to auscultation and percussion with normal breathing effort HEART: regular rate & rhythm and no murmurs and no lower extremity edema ABDOMEN:abdomen soft, non-tender and normal bowel sounds Musculoskeletal:no cyanosis of digits and no clubbing  PSYCH: alert & oriented x 3 with fluent speech NEURO: no focal motor/sensory deficits  LABORATORY DATA:  I have reviewed the data as listed Lab Results  Component Value Date   WBC 4.7 12/05/2022   HGB 12.5 (L) 12/05/2022   HCT 36.0 (L) 12/05/2022   MCV 99.4 12/05/2022   PLT 159 12/05/2022   Recent Labs    02/07/22 0631  NA 133*  K 3.7  CL 100  CO2 26  GLUCOSE 93  BUN 13  CREATININE 1.05  CALCIUM 9.5  GFRNONAA >60    RADIOGRAPHIC STUDIES: I have personally reviewed the radiological images as listed and agreed with the findings in the report. IR KYPHO THORACIC WITH BONE BIOPSY  Result Date: 12/06/2022 INDICATION: T12 compression fracture EXAM: T12 vertebral body augmentation using balloon kyphoplasty COMPARISON:  None Available. MEDICATIONS: Documented in the EMR ANESTHESIA/SEDATION: Moderate (conscious) sedation was employed during this procedure. A total of Versed 2.5 mg and Fentanyl 75 mcg was administered intravenously by the radiology nurse. Total intra-service moderate Sedation Time: 35 minutes. The patient's level of consciousness and vital signs were monitored continuously by radiology  nursing throughout the procedure under my direct supervision. FLUOROSCOPY: Radiation Exposure Index (as provided by the fluoroscopic device): 5.6 minutes (44 mGy) COMPLICATIONS: None immediate. PROCEDURE: Informed written consent was obtained from the patient after a thorough discussion of the procedural risks, benefits and alternatives. All questions were addressed. Maximal Sterile Barrier Technique was utilized including caps, mask, sterile gowns, sterile gloves, sterile drape, hand hygiene and skin antiseptic. A timeout was performed prior to the initiation of the procedure. The patient was positioned prone on the exam table. A unipedicular access was planned. Skin entry site was marked overlying the L1 vertebral body using fluoroscopy. The overlying skin was then prepped and draped in the standard sterile fashion. Local analgesia was obtained with 1% lidocaine. Attention was turned to the right side. Under fluoroscopic guidance, a 10 gauge introducer needle was advanced towards the lateral margin of the pedicle. Using multiple projections, the introducer needle was advanced towards the posterior margin of the vertebral body via a transpedicular approach. The inner needle was then removed, and the drill was advanced towards the anterior margin of the vertebral body. A Kyphon 15 mm inflatable bone tamp was then advanced through the transpedicular access needle and positioned within the mid vertebral body. Kyphoplasty was then performed, ensuring that the balloon contours stayed within the vertebral body margins. The balloon was then deflated and removed, followed by advancement of the bone filler device and the instillation of acrylic bone cement with excellent filling in the AP and lateral projections. No extravasation was noted in the disk spaces or posteriorly into the spinal canal. No epidural venous contamination was seen. At the end of the procedure, the introducer cannula and bone filler device were removed  without difficulty. A clean dressing was placed after hemostasis. The patient tolerated all aspects of the procedure well, and was transferred to recovery in stable condition. IMPRESSION: 1. Successful L1 vertebral body augmentation using balloon kyphoplasty. If the patient has known  osteoporosis, recommend treatment as clinically indicated. If the patient's bone density status is unknown, DEXA scan is recommended. Electronically Signed   By: Olive Bass M.D.   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 CT-guided bone marrow aspiration and core biopsy 2. CT-guided core needle biopsy of focal bone lesion of the L1 vertebral body MEDICATIONS: None. ANESTHESIA/SEDATION: Moderate (conscious) sedation was employed during this procedure. A total of Versed 2 mg and Fentanyl 100 mcg was administered intravenously. Moderate Sedation Time: 55 minutes. The patient's level of consciousness and vital signs were monitored continuously by radiology nursing throughout the procedure under my direct supervision. FLUOROSCOPY TIME:  N/a COMPLICATIONS: None immediate. PROCEDURE: Informed written consent was obtained from the patient after a thorough discussion of the procedural risks, benefits and alternatives. All questions were addressed. Maximal Sterile Barrier Technique was utilized including caps, mask, sterile gowns, sterile gloves, sterile drape, hand hygiene and skin antiseptic. A timeout was performed prior to the initiation of the procedure. Bone marrow aspiration and core biopsy: The patient was placed prone on the CT exam table. Limited CT of the pelvis was performed for planning purposes. Skin entry site was marked, and the overlying skin was prepped and draped in the standard sterile fashion. Local analgesia was obtained with 1% lidocaine. Using CT guidance, an 11 gauge needle was  advanced just deep to the cortex of the right posterior ilium. Subsequently, bone marrow aspiration and core biopsy were performed. Specimens were submitted to lab/pathology for handling. Hemostasis was achieved with manual pressure, and a clean dressing was placed. The patient tolerated the procedure well without immediate complication. L1 vertebral body lesion core biopsy: The patient was placed prone on the exam table. Limited CT of the lumbar spine was performed for planning purposes. This demonstrated no discernible bone lesion, and therefore biopsy route was planned based on anatomic landmarks from corresponding MRI lumbar spine. Skin entry site was marked, and the overlying skin was prepped and draped in the standard sterile fashion. Local analgesia was obtained with 1% lidocaine. Using intermittent CT fluoroscopy, an 11 gauge bone biopsy needle was advanced towards the expected location of the L1 vertebral body lesion using anatomic landmarks. Subsequently, a single 11 gauge core was obtained. Specimens were submitted in formalin to pathology for further handling. Limited postprocedure imaging demonstrated no complicating feature. The patient tolerated the procedure well, and was transferred to recovery in stable condition. IMPRESSION: 1. Successful bone marrow aspiration and core biopsy of the right posterior ilium. 2. Successful CT-guided core needle biopsy of focal lesion of the L1 vertebral body. Electronically Signed   By: Olive Bass M.D.   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 CT-guided bone marrow aspiration and core biopsy 2. CT-guided core needle biopsy of focal bone lesion of the L1 vertebral body MEDICATIONS: None. ANESTHESIA/SEDATION: Moderate (conscious) sedation was employed during this procedure. A total of Versed 2 mg and Fentanyl 100 mcg was  administered intravenously. Moderate Sedation Time: 55 minutes. The patient's level of consciousness and vital signs were monitored continuously by radiology nursing throughout the procedure under my direct supervision. FLUOROSCOPY TIME:  N/a COMPLICATIONS: None immediate. PROCEDURE: Informed written consent was obtained from the patient after a thorough discussion of the procedural risks, benefits and alternatives. All questions were  addressed. Maximal Sterile Barrier Technique was utilized including caps, mask, sterile gowns, sterile gloves, sterile drape, hand hygiene and skin antiseptic. A timeout was performed prior to the initiation of the procedure. Bone marrow aspiration and core biopsy: The patient was placed prone on the CT exam table. Limited CT of the pelvis was performed for planning purposes. Skin entry site was marked, and the overlying skin was prepped and draped in the standard sterile fashion. Local analgesia was obtained with 1% lidocaine. Using CT guidance, an 11 gauge needle was advanced just deep to the cortex of the right posterior ilium. Subsequently, bone marrow aspiration and core biopsy were performed. Specimens were submitted to lab/pathology for handling. Hemostasis was achieved with manual pressure, and a clean dressing was placed. The patient tolerated the procedure well without immediate complication. L1 vertebral body lesion core biopsy: The patient was placed prone on the exam table. Limited CT of the lumbar spine was performed for planning purposes. This demonstrated no discernible bone lesion, and therefore biopsy route was planned based on anatomic landmarks from corresponding MRI lumbar spine. Skin entry site was marked, and the overlying skin was prepped and draped in the standard sterile fashion. Local analgesia was obtained with 1% lidocaine. Using intermittent CT fluoroscopy, an 11 gauge bone biopsy needle was advanced towards the expected location of the L1 vertebral body  lesion using anatomic landmarks. Subsequently, a single 11 gauge core was obtained. Specimens were submitted in formalin to pathology for further handling. Limited postprocedure imaging demonstrated no complicating feature. The patient tolerated the procedure well, and was transferred to recovery in stable condition. IMPRESSION: 1. Successful bone marrow aspiration and core biopsy of the right posterior ilium. 2. Successful CT-guided core needle biopsy of focal lesion of the L1 vertebral body. Electronically Signed   By: Olive Bass M.D.   On: 12/05/2022 13:14   MR Lumbar Spine W Wo Contrast  Result Date: 11/29/2022 CLINICAL DATA:  Compression fracture, thoracic, known malignancy compression fracture/ new lesion; compression fracture/ new lesion. EXAM: MRI THORACIC AND LUMBAR SPINE WITHOUT AND WITH CONTRAST TECHNIQUE: Multiplanar and multiecho pulse sequences of the thoracic and lumbar spine were obtained without and with intravenous contrast. CONTRAST:  6mL GADAVIST GADOBUTROL 1 MMOL/ML IV SOLN COMPARISON:  MRI thoracic spine 11/15/2022. MRI lumbar spine 03/08/2006. FINDINGS: MRI THORACIC SPINE FINDINGS Alignment:  Normal. Vertebrae: Unchanged subacute compression fracture of the T12 vertebral body with mild height loss. Focal enhancing, marrow replacing lesions in the right pedicle of T6 (sagittal image 5 series 24), right inferior endplate T8 (sagittal image 6 series 24), left inferior endplate of T5 (sagittal image 11 series 24), and left pedicle of T2 (sagittal image 13 series 24), all suspicious for metastatic disease. No epidural or paraspinal extension. Cord: Normal spinal cord signal and volume. No abnormal enhancement. Paraspinal and other soft tissues: Unchanged chronic scarring in the lung apices. Disc levels: Unchanged small disc bulge and facet arthropathy contributing to mild right neural foraminal narrowing at T9-10. Unchanged disc bulge and facet arthropathy at T10-11 resulting in mild spinal  canal stenosis. MRI LUMBAR SPINE FINDINGS Segmentation:  Standard. Alignment:  Normal. Vertebrae: Multiple focal enhancing, marrow replacing lesions in the L1 vertebral body, measuring up to 16 mm (sagittal image 11 series 6). Smaller enhancing, marrow replacing lesions in the superior endplate of L2 (image 8 series 6) and superior endplate of L5 (image 7 series 6), again suspicious for metastatic disease. No epidural or paraspinal extension. Conus medullaris: Extends to the L1 level and  appears normal. No abnormal enhancement of the conus or cauda equina nerve roots. Paraspinal and other soft tissues: Unremarkable. Disc levels: T12-L1:  Unremarkable. L1-L2:  Unremarkable. L2-L3: Disc bulge and facet arthropathy results in mild spinal canal stenosis. No significant neural foraminal narrowing. L3-L4: Disc bulge and facet arthropathy results in moderate spinal canal stenosis with compression of the traversing right L4 nerve root in the right lateral recess. Mild bilateral neural foraminal narrowing. L4-L5: Disc bulge and facet arthropathy results in mild spinal canal stenosis with compression of the traversing right L5 nerve root in the right lateral recess. Moderate left and mild right neural foraminal narrowing. L5-S1: Disc bulge and facet arthropathy results in compression of the traversing S1 nerve roots in the lateral recesses bilaterally. No significant neural foraminal narrowing. IMPRESSION: 1. Multiple enhancing, marrow replacing lesions in the thoracic and lumbar spine, suspicious for metastatic disease. No epidural or paraspinal extension. 2. Multilevel degenerative changes in the lumbar spine, worst at L3-L4 where there is moderate spinal canal stenosis and compression of the traversing right L4 nerve root in the right lateral recess. 3. At L4-L5, mild spinal canal stenosis with compression of the traversing right L5 nerve root in the right lateral recess. Moderate left neural foraminal narrowing. 4. At  L5-S1, compression of the traversing S1 nerve roots in the lateral recesses bilaterally. 5. Unchanged subacute compression fracture of the T12 vertebral body with mild height loss. Electronically Signed   By: Orvan Falconer M.D.   On: 11/29/2022 14:58   MR Thoracic Spine W Wo Contrast  Result Date: 11/29/2022 CLINICAL DATA:  Compression fracture, thoracic, known malignancy compression fracture/ new lesion; compression fracture/ new lesion. EXAM: MRI THORACIC AND LUMBAR SPINE WITHOUT AND WITH CONTRAST TECHNIQUE: Multiplanar and multiecho pulse sequences of the thoracic and lumbar spine were obtained without and with intravenous contrast. CONTRAST:  6mL GADAVIST GADOBUTROL 1 MMOL/ML IV SOLN COMPARISON:  MRI thoracic spine 11/15/2022. MRI lumbar spine 03/08/2006. FINDINGS: MRI THORACIC SPINE FINDINGS Alignment:  Normal. Vertebrae: Unchanged subacute compression fracture of the T12 vertebral body with mild height loss. Focal enhancing, marrow replacing lesions in the right pedicle of T6 (sagittal image 5 series 24), right inferior endplate T8 (sagittal image 6 series 24), left inferior endplate of T5 (sagittal image 11 series 24), and left pedicle of T2 (sagittal image 13 series 24), all suspicious for metastatic disease. No epidural or paraspinal extension. Cord: Normal spinal cord signal and volume. No abnormal enhancement. Paraspinal and other soft tissues: Unchanged chronic scarring in the lung apices. Disc levels: Unchanged small disc bulge and facet arthropathy contributing to mild right neural foraminal narrowing at T9-10. Unchanged disc bulge and facet arthropathy at T10-11 resulting in mild spinal canal stenosis. MRI LUMBAR SPINE FINDINGS Segmentation:  Standard. Alignment:  Normal. Vertebrae: Multiple focal enhancing, marrow replacing lesions in the L1 vertebral body, measuring up to 16 mm (sagittal image 11 series 6). Smaller enhancing, marrow replacing lesions in the superior endplate of L2 (image 8  series 6) and superior endplate of L5 (image 7 series 6), again suspicious for metastatic disease. No epidural or paraspinal extension. Conus medullaris: Extends to the L1 level and appears normal. No abnormal enhancement of the conus or cauda equina nerve roots. Paraspinal and other soft tissues: Unremarkable. Disc levels: T12-L1:  Unremarkable. L1-L2:  Unremarkable. L2-L3: Disc bulge and facet arthropathy results in mild spinal canal stenosis. No significant neural foraminal narrowing. L3-L4: Disc bulge and facet arthropathy results in moderate spinal canal stenosis with compression of the  traversing right L4 nerve root in the right lateral recess. Mild bilateral neural foraminal narrowing. L4-L5: Disc bulge and facet arthropathy results in mild spinal canal stenosis with compression of the traversing right L5 nerve root in the right lateral recess. Moderate left and mild right neural foraminal narrowing. L5-S1: Disc bulge and facet arthropathy results in compression of the traversing S1 nerve roots in the lateral recesses bilaterally. No significant neural foraminal narrowing. IMPRESSION: 1. Multiple enhancing, marrow replacing lesions in the thoracic and lumbar spine, suspicious for metastatic disease. No epidural or paraspinal extension. 2. Multilevel degenerative changes in the lumbar spine, worst at L3-L4 where there is moderate spinal canal stenosis and compression of the traversing right L4 nerve root in the right lateral recess. 3. At L4-L5, mild spinal canal stenosis with compression of the traversing right L5 nerve root in the right lateral recess. Moderate left neural foraminal narrowing. 4. At L5-S1, compression of the traversing S1 nerve roots in the lateral recesses bilaterally. 5. Unchanged subacute compression fracture of the T12 vertebral body with mild height loss. Electronically Signed   By: Orvan Falconer M.D.   On: 11/29/2022 14:58   IR Radiologist Eval & Mgmt  Result Date:  11/21/2022 EXAM: NEW PATIENT OFFICE VISIT CHIEF COMPLAINT: Refer to EMR HISTORY OF PRESENT ILLNESS: The patient reports initial onset of vague pain that started several weeks ago, with recent sudden exacerbation approximately 2 weeks ago after a forceful sneeze in the backyard. Patient went to initially get an x-ray which was unremarkable, followed by a thoracic spine MRI demonstrated a subacute T12 compression fracture. Additional note was made of an indeterminate lesion and L1. The patient does have a history of recent prostate cancer that was treated with high-frequency ultrasound in 2023. No report of metastatic disease at time of diagnosis or treatment. Recent PSA scores have been negative. Note, patient was saw referring oncologist Dr. Alena Bills earlier today. Currently has PSA level and additional contrast-enhanced MRI of the thoracic and lumbar spine pending. The patient reports there has been mild improvement in back pain since injury. However he still reports moderate at least 5/10 back pain with movement. He takes over-the-counter Tylenol/ibuprofen approximately 4 times a day for his back pain. He reports moderate disability on the [employer] disability questionnaire with 14/24 positive. He reports slowing in his mobility and inability to do his normal chores around the house because of his back pain. He is only able to stand and walk for short periods of time. His back pain has also affected his sleep and appetite. Personally reviewed for bone density purposes. The patient is osteopenic. REVIEW OF SYSTEMS: Refer to EMR PHYSICAL EXAMINATION: Refer to EMR ASSESSMENT AND PLAN: Refer to EMR Electronically Signed   By: Olive Bass M.D.   On: 11/21/2022 16:34   MR THORACIC SPINE WO CONTRAST  Result Date: 11/15/2022 CLINICAL DATA:  Mid back pain times several weeks. EXAM: MRI THORACIC SPINE WITHOUT CONTRAST TECHNIQUE: Multiplanar, multisequence MR imaging of the thoracic spine was performed. No  intravenous contrast was administered. COMPARISON:  None Available. FINDINGS: Alignment:  Normal. Vertebrae: Compression fracture of the T12 superior endplate with hypointense line and mild surrounding marrow edema. 15 mm T1 hypointense marrow lesion with slight T2 hyperintensity on STIR in the anterior aspect of the L1 vertebral body (sagittal images 11, series 20 and 21). Cord:  Normal spinal cord signal and volume. Paraspinal and other soft tissues: Chronic scarring in the lung apices. No acute or significant findings. Disc levels:  Small disc bulge and facet arthropathy contribute to mild right neural foraminal narrowing at T9-10. Disc bulge and facet arthropathy at T10-11 results in mild spinal canal stenosis. IMPRESSION: 1. Compression fracture of the T12 superior endplate with mild surrounding marrow edema, likely subacute. 2. 15 mm T1 hypointense marrow lesion in the anterior aspect of the L1 vertebral body, concerning for metastatic disease. Further characterization with postcontrast MRI of the thoracic spine is recommended. 3. Mild degenerative changes, with mild spinal canal stenosis at T10-11 and mild right neural foraminal narrowing at T9-10. Electronically Signed   By: Orvan Falconer M.D.   On: 11/15/2022 15:22

## 2022-12-13 NOTE — Progress Notes (Signed)
START ON PATHWAY REGIMEN - Multiple Myeloma and Other Plasma Cell Dyscrasias     Cycles 1 and 2: A cycle is every 28 days:     Lenalidomide      Dexamethasone      Daratumumab and hyaluronidase-fihj    Cycles 3 through 6: A cycle is every 28 days:     Lenalidomide      Dexamethasone      Daratumumab and hyaluronidase-fihj    Cycles 7 and beyond: A cycle is every 28 days:     Lenalidomide      Dexamethasone      Daratumumab and hyaluronidase-fihj   **Always confirm dose/schedule in your pharmacy ordering system**  Patient Characteristics: Multiple Myeloma, Newly Diagnosed, Transplant Ineligible or Refused, High Risk Disease Classification: Multiple Myeloma Therapeutic Status: Newly Diagnosed R2-ISS Staging: Awaiting Test Results Is Patient Eligible for Transplant<= Transplant Ineligible or Refused Risk Status: High Risk Intent of Therapy: Non-Curative / Palliative Intent, Discussed with Patient

## 2022-12-14 ENCOUNTER — Other Ambulatory Visit: Payer: Self-pay

## 2022-12-17 ENCOUNTER — Other Ambulatory Visit (HOSPITAL_COMMUNITY): Payer: Self-pay

## 2022-12-17 ENCOUNTER — Telehealth: Payer: Self-pay | Admitting: Pharmacist

## 2022-12-17 ENCOUNTER — Telehealth: Payer: Self-pay

## 2022-12-17 ENCOUNTER — Other Ambulatory Visit: Payer: Self-pay

## 2022-12-17 ENCOUNTER — Encounter: Payer: Self-pay | Admitting: Internal Medicine

## 2022-12-17 DIAGNOSIS — C9000 Multiple myeloma not having achieved remission: Secondary | ICD-10-CM

## 2022-12-17 DIAGNOSIS — M25551 Pain in right hip: Secondary | ICD-10-CM | POA: Insufficient documentation

## 2022-12-17 DIAGNOSIS — M4854XA Collapsed vertebra, not elsewhere classified, thoracic region, initial encounter for fracture: Secondary | ICD-10-CM | POA: Diagnosis not present

## 2022-12-17 NOTE — Telephone Encounter (Signed)
Oral Oncology Pharmacist Encounter  Received new prescription for Revlimid (lenalidomide) for the treatment of newly diagnosed kappa IgA multiple myeloma, in conjunction with daratumuab and dexamethasone, planned duration until disease control or unacceptable drug toxicity. Planned start 12/26/22.  CMP from 12/13/22 assessed, no relevant lab abnormalities. Prescription dose and frequency assessed. Dose adjusted for his renal function.   Current medication list in Epic reviewed, no DDIs with lenalidomide identified.  Evaluated chart and no patient barriers to medication adherence identified.   Prescription has been e-scribed to the [facility] for benefits analysis and approval.  Oral Oncology Clinic will continue to follow for insurance authorization, copayment issues, initial counseling and start date.   Remi Haggard, PharmD, BCPS, BCOP, CPP Hematology/Oncology Clinical Pharmacist Practitioner [place]/DB/AP Oral Chemotherapy Navigation Clinic [phone]  12/17/2022 4:03 PM

## 2022-12-17 NOTE — Telephone Encounter (Signed)
Phone call to pt to follow up from her kyphoplasty on 12/05/20. Pt reports his pain is still present but "different."  Pt denies any signs of infection, redness at the site, draining or fever. Pt has no complaints at this time and will be scheduled for an in-person follow up with Dr. Juliette Alcide next week. Pt advised to call back if anything were to change or any concerns arise and we will arrange an in person appointment. Pt verbalized understanding.

## 2022-12-17 NOTE — Telephone Encounter (Signed)
Oral Oncology Patient Advocate Encounter  New authorization   Received notification that prior authorization for Lenalidomide is required.   PA submitted on 12/17/22  Key [id number]  Status is pending     Ardeen Fillers, CPhT Oncology Pharmacy Patient Advocate  [facility]  [phone] (phone) [phone] (fax) 12/17/2022 4:07 PM

## 2022-12-18 ENCOUNTER — Other Ambulatory Visit (HOSPITAL_COMMUNITY): Payer: Self-pay

## 2022-12-18 ENCOUNTER — Telehealth: Payer: Self-pay

## 2022-12-18 ENCOUNTER — Other Ambulatory Visit: Payer: Self-pay

## 2022-12-18 ENCOUNTER — Inpatient Hospital Stay (HOSPITAL_BASED_OUTPATIENT_CLINIC_OR_DEPARTMENT_OTHER): Payer: Medicare HMO | Admitting: Hospice and Palliative Medicine

## 2022-12-18 ENCOUNTER — Telehealth: Payer: Self-pay | Admitting: *Deleted

## 2022-12-18 ENCOUNTER — Other Ambulatory Visit: Payer: Medicare HMO

## 2022-12-18 ENCOUNTER — Encounter: Payer: Self-pay | Admitting: Hospice and Palliative Medicine

## 2022-12-18 ENCOUNTER — Inpatient Hospital Stay: Payer: Medicare HMO | Admitting: Pharmacist

## 2022-12-18 ENCOUNTER — Other Ambulatory Visit: Payer: Self-pay | Admitting: Interventional Radiology

## 2022-12-18 ENCOUNTER — Encounter (HOSPITAL_COMMUNITY): Payer: Self-pay

## 2022-12-18 VITALS — BP 135/75 | HR 81 | Temp 97.8°F | Resp 20

## 2022-12-18 DIAGNOSIS — G893 Neoplasm related pain (acute) (chronic): Secondary | ICD-10-CM | POA: Diagnosis not present

## 2022-12-18 DIAGNOSIS — S22080G Wedge compression fracture of T11-T12 vertebra, subsequent encounter for fracture with delayed healing: Secondary | ICD-10-CM

## 2022-12-18 DIAGNOSIS — C9000 Multiple myeloma not having achieved remission: Secondary | ICD-10-CM | POA: Diagnosis not present

## 2022-12-18 DIAGNOSIS — M4854XA Collapsed vertebra, not elsewhere classified, thoracic region, initial encounter for fracture: Secondary | ICD-10-CM | POA: Diagnosis not present

## 2022-12-18 LAB — IMMUNOFIXATION, URINE

## 2022-12-18 LAB — BETA 2 MICROGLOBULIN, SERUM: Beta-2 Microglobulin: 3.7 mg/L — ABNORMAL HIGH (ref 0.6–2.4)

## 2022-12-18 MED ORDER — PREDNISONE 10 MG PO TABS: ORAL_TABLET | ORAL | 0 refills | Status: DC

## 2022-12-18 MED ORDER — LENALIDOMIDE 10 MG PO CAPS
10.0000 mg | ORAL_CAPSULE | Freq: Every day | ORAL | 0 refills | Status: DC
Start: 2022-12-18 — End: 2023-01-24

## 2022-12-18 MED ORDER — OXYCODONE HCL 5 MG PO TABS: 5.0000 mg | ORAL_TABLET | ORAL | 0 refills | Status: DC | PRN

## 2022-12-18 NOTE — Telephone Encounter (Signed)
Rn contacted patient. Apt added for smc. Pt reports that he is in excruciating pain to the point he is contemplating going to the hospital for pain management. He will make every attempt to get here by 2pm. He declined a sooner apt due to his current state of pain and moving around makes the pain worse. Pt inquired if he needed any xrays prior to the apt with Josh today. I asked pt to come in and be evaluated first and the app can determine at that time if any further imaging is needed.

## 2022-12-18 NOTE — Telephone Encounter (Signed)
Oral Oncology Patient Advocate Encounter  Was successful in securing patient a $12,000.00 grant from [facility] to provide copayment coverage for Lenalidomide.  This will keep the out of pocket expense at $0.     [employer] ID: [id number]   The billing information is as follows and has been shared with [facility].    RxBin: [id number] PCN: [id number] Member ID: [id number] Group ID: [id number] Dates of Eligibility: 11/18/22 through 11/17/23  Fund:  Multiple Myeloma - Medicare Access   Ardeen Fillers, CPhT Oncology Pharmacy Patient Advocate  [facility]  [phone] (phone) [phone] (fax) 12/18/2022 8:19 AM

## 2022-12-18 NOTE — Telephone Encounter (Addendum)
.  Oral Oncology Patient Advocate Encounter  Prior Authorization for Lenalidomide has been approved.    PA# [id number]  Effective dates: 07/22/22 through 07/22/23  Patients co-pay is $4,139.36.   Obtained [facility] Grant to bring co-pay to $0.00.   Ardeen Fillers, CPhT Oncology Pharmacy Patient Advocate  [facility]  [phone] (phone) [phone] (fax) 12/18/2022 7:59 AM'

## 2022-12-18 NOTE — Progress Notes (Signed)
Oral Chemotherapy Clinic [facility]  [phone] [phone]  Patient Care Team: Melvin Arbour, MD as PCP - General (Internal Medicine)   Name of the patient: Melvin Gilmore  [id number]  [birth year]   Date of visit: 12/18/22  HPI: Patient is a 82 y.o. male with newly diagnosed kappa IgA multiple. Planned treatment with lenalidomide, daratumuab, and dexamethasone. Planned start 12/26/22.  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  MEDICATIONS:  Current Outpatient Medications  Medication Sig Dispense Refill   acetaminophen (TYLENOL) 500 MG tablet Take 500 mg by mouth every 6 (six) hours as needed.     ALPRAZolam (XANAX) 0.5 MG tablet Take 0.25-0.5 mg by mouth at bedtime as needed for anxiety.     aspirin EC 81 MG tablet Take 1 tablet (81 mg total) by mouth daily. Swallow whole. 90 tablet 2   cetirizine (ZYRTEC) 10 MG tablet Take 10 mg by mouth as needed for allergies.      docusate sodium (COLACE) 100 MG capsule Take 2 capsules (200 mg total) by mouth 2 (two) times daily. 120 capsule 3   fluticasone (FLONASE) 50 MCG/ACT nasal spray Place 1 spray into both nostrils daily.     lenalidomide (REVLIMID) 10 MG capsule Take 1 capsule (10 mg total) by mouth daily. Take for 21 days, then hold for 7 days. Repeat every 28 days. (Patient not taking: Reported on 12/18/2022) 21 capsule 0   levothyroxine (SYNTHROID, LEVOTHROID) 50 MCG tablet Take 50 mcg by mouth daily before breakfast.     lidocaine (LIDODERM) 5 % Place 1 patch onto the skin daily for 14 days. Remove & Discard patch within 12 hours or as directed by MD (Patient not taking: Reported on 12/18/2022) 14 patch 0   omeprazole (PRILOSEC) 40 MG capsule Take 40 mg by mouth every morning.     oxyCODONE (OXY IR/ROXICODONE) 5 MG immediate release tablet Take 1-2 tablets (5-10 mg total) by mouth every 4 (four) hours as needed for severe pain. 45 tablet 0   Plant Sterols and Stanols (CHOLESTOFF) 450 MG TABS Take 1 tablet by mouth every morning.     predniSONE (DELTASONE) 10 MG tablet Take 1 tablet (10mg ) daily x 1 week, then take 5mg  (1/2 tablet) daily x 1 week, then stop. 15 tablet 0   Probiotic Product (PROBIOTIC ADVANCED PO) Take 1 tablet  by mouth at bedtime.     senna (SENOKOT) 8.6 MG TABS tablet Take 2 tablets (17.2 mg total) by mouth daily as needed for mild constipation. 60 tablet 2   tadalafil (CIALIS) 5 MG tablet Take 5 mg by mouth daily as needed for erectile dysfunction. (Patient not taking: Reported on 12/18/2022)     No current facility-administered medications for this visit.    VITAL SIGNS: There were no vitals taken for this visit. There were no vitals filed for this visit.  Estimated body mass index is 20.4 kg/m as calculated from the following:   Height as of 12/05/22: 5\' 11"  (1.803 m).   Weight as of 12/13/22: 66.4 kg (146 lb 4.8 oz).  LABS: CBC:    Component Value Date/Time   WBC 4.7 12/05/2022 0746    HGB 12.5 (L) 12/05/2022 0746   HCT 36.0 (L) 12/05/2022 0746   PLT 159 12/05/2022 0746   MCV 99.4 12/05/2022 0746   NEUTROABS 2.1 12/05/2022 0746   LYMPHSABS 2.1 12/05/2022 0746   MONOABS 0.4 12/05/2022 0746   EOSABS 0.1 12/05/2022 0746   BASOSABS 0.0 12/05/2022 0746   Comprehensive Metabolic Panel:    Component Value Date/Time   NA 132 (L) 12/13/2022 1454   K 4.8 12/13/2022 1454   CL 93 (L) 12/13/2022 1454   CO2 27 12/13/2022 1454   BUN 20 12/13/2022 1454   CREATININE 1.04 12/13/2022 1454   GLUCOSE 104 (H) 12/13/2022 1454   CALCIUM 9.5 12/13/2022 1454   AST 23 12/13/2022 1454   ALT 17 12/13/2022 1454   ALKPHOS 73 12/13/2022 1454   BILITOT 0.5 12/13/2022 1454   PROT 9.2 (H) 12/13/2022 1454   ALBUMIN 3.6 12/13/2022 1454     Present during today's visit: patient and his wife  Start plan: treatment will start on 12/26/22   Patient Education I spoke with patient for overview of new oral chemotherapy medication: lenalidomide and the associated REMS program  Administration: Counseled patient on administration: Lenalidomide: Patient will take 1 capsule (10 mg total) by mouth daily. Take for 21 days, then hold for 7 days. Repeat every 28 days. Aspirin: Take 1 tablet (81 mg total) by mouth daily.   Patient has been enrolled and educated on the lenalidomide REMs program.   The Melvin Gilmore voiced understanding and appreciation. All questions answered. Medication handout provided.  Provided patient with Oral Chemotherapy Navigation Clinic phone number. Patient knows to call the office with questions or concerns. Oral Chemotherapy Navigation Clinic will continue to follow.  Patient expressed understanding and was in agreement with this plan. He also understands that He can call clinic at any time with any questions, concerns, or complaints.   Medication Access Issues: Prescription for lenalidomide sent to [facility] today, Melvin Gilmore obtained grant  Follow-up plan: RTC next week  for treatment and lenalidomide full education  Thank you for allowing me to participate in the care of this patient.   Time Total: 15 mins  Visit consisted of counseling and education on dealing with issues of symptom management in the setting of serious and potentially life-threatening illness.Greater than 50%  of this time was spent counseling and coordinating care related to the above assessment and plan.  Signed by: Remi Haggard, PharmD, BCPS, Nolon Bussing, CPP Hematology/Oncology Clinical Pharmacist Practitioner [place]/DB/AP Oral Chemotherapy Navigation Clinic [phone]  12/18/2022 4:41 PM

## 2022-12-18 NOTE — Telephone Encounter (Signed)
-----   Message from [name], MD sent at 12/18/2022 11:05 AM EDT ----- Hello,   I saw this patient on Friday for newly diagnosed multiple myeloma. His major concern was severe right hip pain and inability to ambulate. I got Xray which only showed degenerative changes. No fracture.   I advised to take Norco 5-325 q4 prn and added lidocaine patches. He tried Robaxin which did not help. I called the wife today to follow up. He is using Norco every 6 hrs and seems like just got approval for patches. He still continues to be in a lot of pain.   Can [facility] evaluate him today please. Thanks.

## 2022-12-18 NOTE — Progress Notes (Signed)
[facility]pt[facility] at [facility] Telephone:([phone] Fax:([phone]) [phone]  Patient Care Team: [name], MD as PCP - General (Internal Medicine)   NAME OF PATIENT: Melvin Gilmore  [id number]  01-21-1941   DATE OF VISIT: 12/18/22  REASON FOR CONSULT: Melvin Gilmore is a 82 y.o. male with multiple medical problems including history of prostate cancer status post HIFU, now with multiple myeloma.  MRI thoracic and lumbar spine showed a subacute compression fracture of T12 and focal enhancing marrow replacing lesions in T2, T5, T6, T8, and L1.    INTERVAL HISTORY: Patient is status post T12 kyphoplasty on 12/06/2022.  He has recent right hip pain for which he was prescribed Norco and Lidoderm patches.  Patient says that he has been taking Norco but has not found that to be helpful.  He is taking acetaminophen plus ibuprofen and says that that has worked better for improving his pain.  However, he states that his pain remains poorly controlled.  Patient describes sharp pain in the low back, which radiates down the right hip.  He says movement exacerbates the pain.  He denies any adverse effects from pain medications other than constipation.  Last bowel movement 2 days ago.  Patient states that he just started senna today.  Denies any neurologic complaints. Denies recent fevers or illnesses. Denies any easy bleeding or bruising. Reports fair appetite.  Denies chest pain.  Denies urinary complaints. Patient offers no further specific complaints today.   PAST MEDICAL HISTORY: Past Medical History:  Diagnosis Date   Anxiety    a.) on BZO (alprazolam) PRN   Cervicalgia    Chest pain, non-cardiac    Elevated PSA    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   Distal with moderate free air surrounding Hiatal Hernia   Gallbladder polyp    Gastritis    GERD (gastroesophageal reflux disease)    History of hiatal hernia    HLD (hyperlipidemia)     HTN (hypertension)    Hypothyroidism    Meniere disease    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   Prostate cancer (HCC) 2023   Seasonal allergies    Skin cancer    a.) ears   Wears partial dentures    lower    PAST SURGICAL HISTORY:  Past Surgical History:  Procedure Laterality Date   APPENDECTOMY     CATARACT EXTRACTION W/PHACO Right 01/15/2016   Procedure: CATARACT EXTRACTION PHACO AND INTRAOCULAR LENS PLACEMENT (IOC);  Surgeon: Sherald Hess, MD;  Location: [facility];  Service: Ophthalmology;  Laterality: Right;  RIGHT CALL CELL PHONE WITH TIME   CHOLECYSTECTOMY     COLONOSCOPY     COLONOSCOPY WITH ESOPHAGOGASTRODUODENOSCOPY (EGD)     ESOPHAGOGASTRODUODENOSCOPY  07/2021   ESOPHAGOGASTRODUODENOSCOPY (EGD) WITH PROPOFOL N/A 09/23/2017   Procedure: ESOPHAGOGASTRODUODENOSCOPY (EGD) WITH PROPOFOL;  Surgeon: Toledo, Boykin Nearing, MD;  Location: [facility];  Service: Gastroenterology;  Laterality: N/A;   HIGH INTENSITY FOCUSED ULTRASOUND (HIFU) OF THE PROSTATE N/A 02/07/2022   Procedure: HIGH INTENSITY FOCUSED ULTRASOUND (HIFU) OF THE PROSTATE;  Surgeon: Orson Ape, MD;  Location: [facility];  Service: Urology;  Laterality: N/A;   HYDROCELE EXCISION / REPAIR     IR KYPHO THORACIC WITH BONE BIOPSY  12/06/2022   IR RADIOLOGIST EVAL & MGMT  11/21/2022   LASIX RT EYE     PROSTATE BIOPSY N/A 01/03/2022   Procedure: PROSTATE BIOPSY  Addison Rauber;  Surgeon: Orson Ape, MD;  Location: [facility];  Service: Urology;  Laterality: N/A;   ROTATOR CUFF REPAIR Left 2001    HEMATOLOGY/ONCOLOGY HISTORY:  Oncology History  Multiple myeloma (HCC)  12/13/2022 Initial Diagnosis   Multiple myeloma (HCC)   12/26/2022 -  Chemotherapy   Patient is on Treatment Plan : MYELOMA NEWLY DIAGNOSED Daratumumab IV + Lenalidomide + Dexamethasone Weekly (DaraRd) q28d       ALLERGIES:  is allergic to amoxicillin-pot clavulanate and meloxicam.  MEDICATIONS:  Current  Outpatient Medications  Medication Sig Dispense Refill   acetaminophen (TYLENOL) 500 MG tablet Take 500 mg by mouth every 6 (six) hours as needed.     ALPRAZolam (XANAX) 0.5 MG tablet Take 0.25-0.5 mg by mouth at bedtime as needed for anxiety.     aspirin EC 81 MG tablet Take 1 tablet (81 mg total) by mouth daily. Swallow whole. 90 tablet 2   cetirizine (ZYRTEC) 10 MG tablet Take 10 mg by mouth as needed for allergies.     docusate sodium (COLACE) 100 MG capsule Take 2 capsules (200 mg total) by mouth 2 (two) times daily. 120 capsule 3   fluticasone (FLONASE) 50 MCG/ACT nasal spray Place 1 spray into both nostrils daily.     HYDROcodone-acetaminophen (NORCO/VICODIN) 5-325 MG tablet Take 1 tablet by mouth every 6 (six) hours as needed for severe pain. 90 tablet 0   lenalidomide (REVLIMID) 10 MG capsule Take 1 capsule (10 mg total) by mouth daily. Take for 21 days, then hold for 7 days. Repeat every 28 days. 21 capsule 0   levothyroxine (SYNTHROID, LEVOTHROID) 50 MCG tablet Take 50 mcg by mouth daily before breakfast.     lidocaine (LIDODERM) 5 % Place 1 patch onto the skin daily for 14 days. Remove & Discard patch within 12 hours or as directed by MD 14 patch 0   omeprazole (PRILOSEC) 40 MG capsule Take 40 mg by mouth every morning.     Plant Sterols and Stanols (CHOLESTOFF) 450 MG TABS Take 1 tablet by mouth every morning.     Probiotic Product (PROBIOTIC ADVANCED PO) Take 1 tablet by mouth at bedtime.     senna (SENOKOT) 8.6 MG TABS tablet Take 2 tablets (17.2 mg total) by mouth daily as needed for mild constipation. 60 tablet 2   tadalafil (CIALIS) 5 MG tablet Take 5 mg by mouth daily as needed for erectile dysfunction.     tamsulosin (FLOMAX) 0.4 MG CAPS capsule Take 0.4 mg by mouth daily after supper. (Patient not taking: Reported on 11/21/2022)     traZODone (DESYREL) 50 MG tablet  (Patient not taking: Reported on 11/21/2022)     No current facility-administered medications for this visit.     VITAL SIGNS: BP 135/75   Pulse 81   Temp 97.8 F (36.6 C) (Tympanic)   Resp 20  There were no vitals filed for this visit.  Estimated body mass index is 20.4 kg/m as calculated from the following:   Height as of 12/05/22: 5\' 11"  (1.803 m).   Weight as of 12/13/22: 146 lb 4.8 oz (66.4 kg).  LABS: CBC:    Component Value Date/Time   WBC 4.7 12/05/2022 0746   HGB 12.5 (L) 12/05/2022 0746   HCT 36.0 (L) 12/05/2022 0746   PLT 159 12/05/2022 0746   MCV 99.4 12/05/2022 0746   NEUTROABS 2.1 12/05/2022 0746   LYMPHSABS 2.1 12/05/2022 0746   MONOABS 0.4 12/05/2022 0746   EOSABS 0.1 12/05/2022 0746   BASOSABS 0.0 12/05/2022 0746  Comprehensive Metabolic Panel:    Component Value Date/Time   NA 132 (L) 12/13/2022 1454   K 4.8 12/13/2022 1454   CL 93 (L) 12/13/2022 1454   CO2 27 12/13/2022 1454   BUN 20 12/13/2022 1454   CREATININE 1.04 12/13/2022 1454   GLUCOSE 104 (H) 12/13/2022 1454   CALCIUM 9.5 12/13/2022 1454   AST 23 12/13/2022 1454   ALT 17 12/13/2022 1454   ALKPHOS 73 12/13/2022 1454   BILITOT 0.5 12/13/2022 1454   PROT 9.2 (H) 12/13/2022 1454   ALBUMIN 3.6 12/13/2022 1454    RADIOGRAPHIC STUDIES: IR KYPHO THORACIC WITH BONE BIOPSY  Addendum Date: 12/17/2022   ADDENDUM REPORT: 12/17/2022 16:11 ADDENDUM: The operated vertebral body was T12, as amended in portions of the report as detailed below. PROCEDURE: The patient was positioned prone on the exam table. A unipedicular access was planned. Skin entry site was marked overlying the T12 vertebral body using fluoroscopy. IMPRESSION: 1. Successful T12 vertebral body augmentation using balloon kyphoplasty. Electronically Signed   By: Olive Bass M.D.   On: 12/17/2022 16:11   Result Date: 12/17/2022 INDICATION: T12 compression fracture EXAM: T12 vertebral body augmentation using balloon kyphoplasty COMPARISON:  None Available. MEDICATIONS: Documented in the EMR ANESTHESIA/SEDATION: Moderate (conscious) sedation was  employed during this procedure. A total of Versed 2.5 mg and Fentanyl 75 mcg was administered intravenously by the radiology nurse. Total intra-service moderate Sedation Time: 35 minutes. The patient's level of consciousness and vital signs were monitored continuously by radiology nursing throughout the procedure under my direct supervision. FLUOROSCOPY: Radiation Exposure Index (as provided by the fluoroscopic device): 5.6 minutes (44 mGy) COMPLICATIONS: None immediate. PROCEDURE: Informed written consent was obtained from the patient after a thorough discussion of the procedural risks, benefits and alternatives. All questions were addressed. Maximal Sterile Barrier Technique was utilized including caps, mask, sterile gowns, sterile gloves, sterile drape, hand hygiene and skin antiseptic. A timeout was performed prior to the initiation of the procedure. The patient was positioned prone on the exam table. A unipedicular access was planned. Skin entry site was marked overlying the L1 vertebral body using fluoroscopy. The overlying skin was then prepped and draped in the standard sterile fashion. Local analgesia was obtained with 1% lidocaine. Attention was turned to the right side. Under fluoroscopic guidance, a 10 gauge introducer needle was advanced towards the lateral margin of the pedicle. Using multiple projections, the introducer needle was advanced towards the posterior margin of the vertebral body via a transpedicular approach. The inner needle was then removed, and the drill was advanced towards the anterior margin of the vertebral body. A Kyphon 15 mm inflatable bone tamp was then advanced through the transpedicular access needle and positioned within the mid vertebral body. Kyphoplasty was then performed, ensuring that the balloon contours stayed within the vertebral body margins. The balloon was then deflated and removed, followed by advancement of the bone filler device and the instillation of acrylic  bone cement with excellent filling in the AP and lateral projections. No extravasation was noted in the disk spaces or posteriorly into the spinal canal. No epidural venous contamination was seen. At the end of the procedure, the introducer cannula and bone filler device were removed without difficulty. A clean dressing was placed after hemostasis. The patient tolerated all aspects of the procedure well, and was transferred to recovery in stable condition. IMPRESSION: 1. Successful L1 vertebral body augmentation using balloon kyphoplasty. If the patient has known osteoporosis, recommend treatment as clinically indicated. If  the patient's bone density status is unknown, DEXA scan is recommended. Electronically Signed: By: Olive Bass M.D. On: 12/06/2022 10:48   DG HIP UNILAT WITH PELVIS 2-3 VIEWS RIGHT  Result Date: 12/14/2022 CLINICAL DATA:  Pain. EXAM: DG HIP (WITH OR WITHOUT PELVIS) 3V RIGHT COMPARISON:  None Available. FINDINGS: Bilateral hip osteophytes and joint space narrowing. Pelvic ring intact. No fracture, dislocation or subluxation. IMPRESSION: Bilateral hip degenerative changes. Electronically Signed   By: Layla Maw M.D.   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 CT-guided bone marrow aspiration and core biopsy 2. CT-guided core needle biopsy of focal bone lesion of the L1 vertebral body MEDICATIONS: None. ANESTHESIA/SEDATION: Moderate (conscious) sedation was employed during this procedure. A total of Versed 2 mg and Fentanyl 100 mcg was administered intravenously. Moderate Sedation Time: 55 minutes. The patient's level of consciousness and vital signs were monitored continuously by radiology nursing throughout the procedure under my direct supervision. FLUOROSCOPY TIME:  N/a COMPLICATIONS: None immediate. PROCEDURE: Informed written consent was  obtained from the patient after a thorough discussion of the procedural risks, benefits and alternatives. All questions were addressed. Maximal Sterile Barrier Technique was utilized including caps, mask, sterile gowns, sterile gloves, sterile drape, hand hygiene and skin antiseptic. A timeout was performed prior to the initiation of the procedure. Bone marrow aspiration and core biopsy: The patient was placed prone on the CT exam table. Limited CT of the pelvis was performed for planning purposes. Skin entry site was marked, and the overlying skin was prepped and draped in the standard sterile fashion. Local analgesia was obtained with 1% lidocaine. Using CT guidance, an 11 gauge needle was advanced just deep to the cortex of the right posterior ilium. Subsequently, bone marrow aspiration and core biopsy were performed. Specimens were submitted to lab/pathology for handling. Hemostasis was achieved with manual pressure, and a clean dressing was placed. The patient tolerated the procedure well without immediate complication. L1 vertebral body lesion core biopsy: The patient was placed prone on the exam table. Limited CT of the lumbar spine was performed for planning purposes. This demonstrated no discernible bone lesion, and therefore biopsy route was planned based on anatomic landmarks from corresponding MRI lumbar spine. Skin entry site was marked, and the overlying skin was prepped and draped in the standard sterile fashion. Local analgesia was obtained with 1% lidocaine. Using intermittent CT fluoroscopy, an 11 gauge bone biopsy needle was advanced towards the expected location of the L1 vertebral body lesion using anatomic landmarks. Subsequently, a single 11 gauge core was obtained. Specimens were submitted in formalin to pathology for further handling. Limited postprocedure imaging demonstrated no complicating feature. The patient tolerated the procedure well, and was transferred to recovery in stable  condition. IMPRESSION: 1. Successful bone marrow aspiration and core biopsy of the right posterior ilium. 2. Successful CT-guided core needle biopsy of focal lesion of the L1 vertebral body. Electronically Signed   By: Olive Bass M.D.   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 CT-guided bone marrow aspiration and core biopsy 2. CT-guided core needle biopsy of focal bone lesion of the L1 vertebral body MEDICATIONS: None. ANESTHESIA/SEDATION: Moderate (conscious) sedation was employed during this procedure. A total of Versed 2 mg and Fentanyl 100 mcg was administered intravenously. Moderate  Sedation Time: 55 minutes. The patient's level of consciousness and vital signs were monitored continuously by radiology nursing throughout the procedure under my direct supervision. FLUOROSCOPY TIME:  N/a COMPLICATIONS: None immediate. PROCEDURE: Informed written consent was obtained from the patient after a thorough discussion of the procedural risks, benefits and alternatives. All questions were addressed. Maximal Sterile Barrier Technique was utilized including caps, mask, sterile gowns, sterile gloves, sterile drape, hand hygiene and skin antiseptic. A timeout was performed prior to the initiation of the procedure. Bone marrow aspiration and core biopsy: The patient was placed prone on the CT exam table. Limited CT of the pelvis was performed for planning purposes. Skin entry site was marked, and the overlying skin was prepped and draped in the standard sterile fashion. Local analgesia was obtained with 1% lidocaine. Using CT guidance, an 11 gauge needle was advanced just deep to the cortex of the right posterior ilium. Subsequently, bone marrow aspiration and core biopsy were performed. Specimens were submitted to lab/pathology for handling. Hemostasis was achieved with manual  pressure, and a clean dressing was placed. The patient tolerated the procedure well without immediate complication. L1 vertebral body lesion core biopsy: The patient was placed prone on the exam table. Limited CT of the lumbar spine was performed for planning purposes. This demonstrated no discernible bone lesion, and therefore biopsy route was planned based on anatomic landmarks from corresponding MRI lumbar spine. Skin entry site was marked, and the overlying skin was prepped and draped in the standard sterile fashion. Local analgesia was obtained with 1% lidocaine. Using intermittent CT fluoroscopy, an 11 gauge bone biopsy needle was advanced towards the expected location of the L1 vertebral body lesion using anatomic landmarks. Subsequently, a single 11 gauge core was obtained. Specimens were submitted in formalin to pathology for further handling. Limited postprocedure imaging demonstrated no complicating feature. The patient tolerated the procedure well, and was transferred to recovery in stable condition. IMPRESSION: 1. Successful bone marrow aspiration and core biopsy of the right posterior ilium. 2. Successful CT-guided core needle biopsy of focal lesion of the L1 vertebral body. Electronically Signed   By: Olive Bass M.D.   On: 12/05/2022 13:14   MR Lumbar Spine W Wo Contrast  Result Date: 11/29/2022 CLINICAL DATA:  Compression fracture, thoracic, known malignancy compression fracture/ new lesion; compression fracture/ new lesion. EXAM: MRI THORACIC AND LUMBAR SPINE WITHOUT AND WITH CONTRAST TECHNIQUE: Multiplanar and multiecho pulse sequences of the thoracic and lumbar spine were obtained without and with intravenous contrast. CONTRAST:  6mL GADAVIST GADOBUTROL 1 MMOL/ML IV SOLN COMPARISON:  MRI thoracic spine 11/15/2022. MRI lumbar spine 03/08/2006. FINDINGS: MRI THORACIC SPINE FINDINGS Alignment:  Normal. Vertebrae: Unchanged subacute compression fracture of the T12 vertebral body with mild  height loss. Focal enhancing, marrow replacing lesions in the right pedicle of T6 (sagittal image 5 series 24), right inferior endplate T8 (sagittal image 6 series 24), left inferior endplate of T5 (sagittal image 11 series 24), and left pedicle of T2 (sagittal image 13 series 24), all suspicious for metastatic disease. No epidural or paraspinal extension. Cord: Normal spinal cord signal and volume. No abnormal enhancement. Paraspinal and other soft tissues: Unchanged chronic scarring in the lung apices. Disc levels: Unchanged small disc bulge and facet arthropathy contributing to mild right neural foraminal narrowing at T9-10. Unchanged disc bulge and facet arthropathy at T10-11 resulting in mild spinal canal stenosis. MRI LUMBAR SPINE FINDINGS Segmentation:  Standard. Alignment:  Normal. Vertebrae: Multiple focal enhancing, marrow replacing lesions in the  L1 vertebral body, measuring up to 16 mm (sagittal image 11 series 6). Smaller enhancing, marrow replacing lesions in the superior endplate of L2 (image 8 series 6) and superior endplate of L5 (image 7 series 6), again suspicious for metastatic disease. No epidural or paraspinal extension. Conus medullaris: Extends to the L1 level and appears normal. No abnormal enhancement of the conus or cauda equina nerve roots. Paraspinal and other soft tissues: Unremarkable. Disc levels: T12-L1:  Unremarkable. L1-L2:  Unremarkable. L2-L3: Disc bulge and facet arthropathy results in mild spinal canal stenosis. No significant neural foraminal narrowing. L3-L4: Disc bulge and facet arthropathy results in moderate spinal canal stenosis with compression of the traversing right L4 nerve root in the right lateral recess. Mild bilateral neural foraminal narrowing. L4-L5: Disc bulge and facet arthropathy results in mild spinal canal stenosis with compression of the traversing right L5 nerve root in the right lateral recess. Moderate left and mild right neural foraminal narrowing.  L5-S1: Disc bulge and facet arthropathy results in compression of the traversing S1 nerve roots in the lateral recesses bilaterally. No significant neural foraminal narrowing. IMPRESSION: 1. Multiple enhancing, marrow replacing lesions in the thoracic and lumbar spine, suspicious for metastatic disease. No epidural or paraspinal extension. 2. Multilevel degenerative changes in the lumbar spine, worst at L3-L4 where there is moderate spinal canal stenosis and compression of the traversing right L4 nerve root in the right lateral recess. 3. At L4-L5, mild spinal canal stenosis with compression of the traversing right L5 nerve root in the right lateral recess. Moderate left neural foraminal narrowing. 4. At L5-S1, compression of the traversing S1 nerve roots in the lateral recesses bilaterally. 5. Unchanged subacute compression fracture of the T12 vertebral body with mild height loss. Electronically Signed   By: Orvan Falconer M.D.   On: 11/29/2022 14:58   MR Thoracic Spine W Wo Contrast  Result Date: 11/29/2022 CLINICAL DATA:  Compression fracture, thoracic, known malignancy compression fracture/ new lesion; compression fracture/ new lesion. EXAM: MRI THORACIC AND LUMBAR SPINE WITHOUT AND WITH CONTRAST TECHNIQUE: Multiplanar and multiecho pulse sequences of the thoracic and lumbar spine were obtained without and with intravenous contrast. CONTRAST:  6mL GADAVIST GADOBUTROL 1 MMOL/ML IV SOLN COMPARISON:  MRI thoracic spine 11/15/2022. MRI lumbar spine 03/08/2006. FINDINGS: MRI THORACIC SPINE FINDINGS Alignment:  Normal. Vertebrae: Unchanged subacute compression fracture of the T12 vertebral body with mild height loss. Focal enhancing, marrow replacing lesions in the right pedicle of T6 (sagittal image 5 series 24), right inferior endplate T8 (sagittal image 6 series 24), left inferior endplate of T5 (sagittal image 11 series 24), and left pedicle of T2 (sagittal image 13 series 24), all suspicious for metastatic  disease. No epidural or paraspinal extension. Cord: Normal spinal cord signal and volume. No abnormal enhancement. Paraspinal and other soft tissues: Unchanged chronic scarring in the lung apices. Disc levels: Unchanged small disc bulge and facet arthropathy contributing to mild right neural foraminal narrowing at T9-10. Unchanged disc bulge and facet arthropathy at T10-11 resulting in mild spinal canal stenosis. MRI LUMBAR SPINE FINDINGS Segmentation:  Standard. Alignment:  Normal. Vertebrae: Multiple focal enhancing, marrow replacing lesions in the L1 vertebral body, measuring up to 16 mm (sagittal image 11 series 6). Smaller enhancing, marrow replacing lesions in the superior endplate of L2 (image 8 series 6) and superior endplate of L5 (image 7 series 6), again suspicious for metastatic disease. No epidural or paraspinal extension. Conus medullaris: Extends to the L1 level and appears normal. No abnormal enhancement  of the conus or cauda equina nerve roots. Paraspinal and other soft tissues: Unremarkable. Disc levels: T12-L1:  Unremarkable. L1-L2:  Unremarkable. L2-L3: Disc bulge and facet arthropathy results in mild spinal canal stenosis. No significant neural foraminal narrowing. L3-L4: Disc bulge and facet arthropathy results in moderate spinal canal stenosis with compression of the traversing right L4 nerve root in the right lateral recess. Mild bilateral neural foraminal narrowing. L4-L5: Disc bulge and facet arthropathy results in mild spinal canal stenosis with compression of the traversing right L5 nerve root in the right lateral recess. Moderate left and mild right neural foraminal narrowing. L5-S1: Disc bulge and facet arthropathy results in compression of the traversing S1 nerve roots in the lateral recesses bilaterally. No significant neural foraminal narrowing. IMPRESSION: 1. Multiple enhancing, marrow replacing lesions in the thoracic and lumbar spine, suspicious for metastatic disease. No epidural  or paraspinal extension. 2. Multilevel degenerative changes in the lumbar spine, worst at L3-L4 where there is moderate spinal canal stenosis and compression of the traversing right L4 nerve root in the right lateral recess. 3. At L4-L5, mild spinal canal stenosis with compression of the traversing right L5 nerve root in the right lateral recess. Moderate left neural foraminal narrowing. 4. At L5-S1, compression of the traversing S1 nerve roots in the lateral recesses bilaterally. 5. Unchanged subacute compression fracture of the T12 vertebral body with mild height loss. Electronically Signed   By: Orvan Falconer M.D.   On: 11/29/2022 14:58   IR Radiologist Eval & Mgmt  Result Date: 11/21/2022 EXAM: NEW PATIENT OFFICE VISIT CHIEF COMPLAINT: Refer to EMR HISTORY OF PRESENT ILLNESS: The patient reports initial onset of vague pain that started several weeks ago, with recent sudden exacerbation approximately 2 weeks ago after a forceful sneeze in the backyard. Patient went to initially get an x-ray which was unremarkable, followed by a thoracic spine MRI demonstrated a subacute T12 compression fracture. Additional note was made of an indeterminate lesion and L1. The patient does have a history of recent prostate cancer that was treated with high-frequency ultrasound in 2023. No report of metastatic disease at time of diagnosis or treatment. Recent PSA scores have been negative. Note, patient was saw referring oncologist Dr. Alena Bills earlier today. Currently has PSA level and additional contrast-enhanced MRI of the thoracic and lumbar spine pending. The patient reports there has been mild improvement in back pain since injury. However he still reports moderate at least 5/10 back pain with movement. He takes over-the-counter Tylenol/ibuprofen approximately 4 times a day for his back pain. He reports moderate disability on the [employer] disability questionnaire with 14/24 positive. He reports slowing in his mobility  and inability to do his normal chores around the house because of his back pain. He is only able to stand and walk for short periods of time. His back pain has also affected his sleep and appetite. Personally reviewed for bone density purposes. The patient is osteopenic. REVIEW OF SYSTEMS: Refer to EMR PHYSICAL EXAMINATION: Refer to EMR ASSESSMENT AND PLAN: Refer to EMR Electronically Signed   By: Olive Bass M.D.   On: 11/21/2022 16:34    PERFORMANCE STATUS (ECOG) : 2 - Symptomatic, <50% confined to bed  Review of Systems Unless otherwise noted, a complete review of systems is negative.  Physical Exam General: NAD Cardiovascular: regular rate and rhythm Pulmonary: clear ant fields Abdomen: soft, nontender, + bowel sounds GU: no suprapubic tenderness Extremities: no edema, no joint deformities Skin: no rashes Neurological: Weakness but otherwise  nonfocal  IMPRESSION/PLAN: Multiple Myeloma -patient has not yet started treatment.  He is pending PET scan.  Neoplasm related pain -suspect that pain is likely secondary to known spinal lesions.  He did have x-ray of the hip on 5/25 which showed bilateral hip degenerative changes but no fracture.  Again, patient is pending PET scan.  Norco does not seem to be effective so we will rotate to oxycodone.  Patient advised to stop ibuprofen.  Will start him on a short course of low-dose prednisone given probable inflammatory component of pain.  Constipation -recommended MiraLAX plus senna  Follow-up telephone visit 2 days   Patient expressed understanding and was in agreement with this plan. He also understands that He can call clinic at any time with any questions, concerns, or complaints.   Thank you for allowing me to participate in the care of this very pleasant patient.   Time Total: 15 minutes  Visit consisted of counseling and education dealing with the complex and emotionally intense issues of symptom management in the setting of  serious illness.Greater than 50%  of this time was spent counseling and coordinating care related to the above assessment and plan.  Signed by: Laurette Schimke, PhD, NP-C

## 2022-12-19 ENCOUNTER — Other Ambulatory Visit: Payer: Self-pay

## 2022-12-20 ENCOUNTER — Inpatient Hospital Stay: Payer: Medicare HMO

## 2022-12-20 ENCOUNTER — Inpatient Hospital Stay (HOSPITAL_BASED_OUTPATIENT_CLINIC_OR_DEPARTMENT_OTHER): Payer: Medicare HMO | Admitting: Hospice and Palliative Medicine

## 2022-12-20 DIAGNOSIS — C9000 Multiple myeloma not having achieved remission: Secondary | ICD-10-CM

## 2022-12-20 DIAGNOSIS — G893 Neoplasm related pain (acute) (chronic): Secondary | ICD-10-CM

## 2022-12-20 LAB — UPEP/TP, 24-HR URINE
Albumin, U: 100 %
Alpha 1, Urine: 0 %
Alpha 2, Urine: 0 %
Beta, Urine: 0 %
Gamma Globulin, Urine: 0 %
Total Protein, Urine-Ur/day: 117 mg/24 hr (ref 30–150)
Total Protein, Urine: 5.3 mg/dL
Total Volume: 2200

## 2022-12-20 MED ORDER — ONDANSETRON HCL 8 MG PO TABS
8.0000 mg | ORAL_TABLET | Freq: Three times a day (TID) | ORAL | 1 refills | Status: DC | PRN
Start: 2022-12-20 — End: 2023-01-31

## 2022-12-20 NOTE — Progress Notes (Signed)
I called and spoke with patient's wife.  She reports that patient's pain is slightly improved on oxycodone and prednisone.  However, it does not yet sound like pain is well-controlled.  Wife is giving patient 1 oxycodone about every 4 hours.  Recommended that she increase oxycodone to 2 tablets every 4 hours as needed.  Could consider starting a long-acting opioid if needed.

## 2022-12-21 ENCOUNTER — Other Ambulatory Visit: Payer: Self-pay

## 2022-12-23 NOTE — Telephone Encounter (Signed)
Per [facility], medication out for delivery today.   [employer] tracking #: [id number]

## 2022-12-24 ENCOUNTER — Other Ambulatory Visit: Payer: Medicare HMO

## 2022-12-24 ENCOUNTER — Other Ambulatory Visit: Payer: Self-pay | Admitting: *Deleted

## 2022-12-24 MED ORDER — OXYCODONE HCL 5 MG PO TABS: 5.0000 mg | ORAL_TABLET | ORAL | 0 refills | Status: DC | PRN

## 2022-12-24 NOTE — Telephone Encounter (Signed)
Wife called reporting that patient is doing much better with the steroids and Oxycodone prescriptions and he is afraid to run out of medicine, he is down to 11 tabs and is asking for a refill.

## 2022-12-25 ENCOUNTER — Ambulatory Visit
Admission: RE | Admit: 2022-12-25 | Discharge: 2022-12-25 | Disposition: A | Discharge status: Home / Self Care | Payer: Medicare HMO | Source: Ambulatory Visit | Attending: Internal Medicine | Admitting: Internal Medicine

## 2022-12-25 ENCOUNTER — Encounter: Payer: Self-pay | Admitting: Internal Medicine

## 2022-12-25 ENCOUNTER — Encounter: Payer: Self-pay | Admitting: Pharmacist

## 2022-12-25 ENCOUNTER — Telehealth: Payer: Self-pay | Admitting: *Deleted

## 2022-12-25 DIAGNOSIS — C9000 Multiple myeloma not having achieved remission: Secondary | ICD-10-CM

## 2022-12-25 LAB — GLUCOSE, CAPILLARY: Glucose-Capillary: 102 mg/dL — ABNORMAL HIGH (ref 70–99)

## 2022-12-25 MED ORDER — FLUDEOXYGLUCOSE F - 18 (FDG) INJECTION
7.6000 | Freq: Once | INTRAVENOUS | Status: AC
  Administered 2022-12-25: 8.59 via INTRAVENOUS

## 2022-12-25 MED ORDER — LACTULOSE 10 GM/15ML PO SOLN: ORAL | 0 refills | Status: DC

## 2022-12-25 NOTE — Telephone Encounter (Signed)
Prescription sent fr lactulose, call to [name] and left message on voice mail that prescription has been sent in

## 2022-12-25 NOTE — Telephone Encounter (Signed)
Patient wife Melvin Gilmore called reporting that patient has severe constipation and it has been 3-4 days since his last bowel movement. He has tried Senna, Miralax, Glycerin suppositories and dietary changes and none of it is working. She is asking what else can be done for it. Please advise

## 2022-12-25 NOTE — Telephone Encounter (Signed)
Oral Chemotherapy Pharmacist Encounter  Patient Education I spoke with patient's wife Melvin Gilmore in person or overview of new oral chemotherapy medication: Revlimid (lenalidomide) for the treatment of newly diagnosed kappa IgA multiple myeloma, in conjunction with daratumuab and dexamethasone, planned duration until disease control or unacceptable drug toxicity. Planned start 12/26/22.   Counseled [name] on administration, dosing, side effects, monitoring, drug-food interactions, safe handling, storage, and disposal. Patient will take: Lenalidomide: Take 1 capsule (10 mg total) by mouth daily. Take for 21 days, then hold for 7 days. Repeat every 28 days.  Aspirin: Take 1 tablet (81 mg total) by mouth daily. Swallow whole.  Acyclovir: Take 1 tablet (400 mg total) by mouth 2 (two) times daily.   Side effects include but not limited to: rash/itchy skin, N/V, fatigue, decreased wbc/hgb/plt, constipation or diarrhea.    Reviewed with Melvin Gilmore importance of keeping a medication schedule and plan for any missed doses.  After discussion with Melvin Gilmore no patient barriers to medication adherence identified.   [name] voiced understanding and appreciation. All questions answered. Medication handout and calendar provided.  Provided [name] with [facility] phone number. Melvin Gilmore knows to call the office with questions or concerns. Oral Chemotherapy Navigation Clinic will continue to follow.  Remi Haggard, PharmD, BCPS, BCOP, CPP Hematology/Oncology Clinical Pharmacist Practitioner [place] Oral Chemotherapy Navigation [facility] [phone]  12/25/2022 2:45 PM

## 2022-12-26 ENCOUNTER — Inpatient Hospital Stay: Payer: Medicare HMO | Attending: Internal Medicine

## 2022-12-26 ENCOUNTER — Inpatient Hospital Stay: Payer: Medicare HMO

## 2022-12-26 ENCOUNTER — Encounter: Payer: Self-pay | Admitting: Internal Medicine

## 2022-12-26 ENCOUNTER — Inpatient Hospital Stay (HOSPITAL_BASED_OUTPATIENT_CLINIC_OR_DEPARTMENT_OTHER): Payer: Medicare HMO | Admitting: Internal Medicine

## 2022-12-26 VITALS — BP 122/88 | HR 90 | Temp 97.0°F | Wt 145.0 lb

## 2022-12-26 DIAGNOSIS — I10 Essential (primary) hypertension: Secondary | ICD-10-CM | POA: Insufficient documentation

## 2022-12-26 DIAGNOSIS — E785 Hyperlipidemia, unspecified: Secondary | ICD-10-CM | POA: Insufficient documentation

## 2022-12-26 DIAGNOSIS — N400 Enlarged prostate without lower urinary tract symptoms: Secondary | ICD-10-CM | POA: Insufficient documentation

## 2022-12-26 DIAGNOSIS — D649 Anemia, unspecified: Secondary | ICD-10-CM | POA: Insufficient documentation

## 2022-12-26 DIAGNOSIS — Z515 Encounter for palliative care: Secondary | ICD-10-CM | POA: Insufficient documentation

## 2022-12-26 DIAGNOSIS — C61 Malignant neoplasm of prostate: Secondary | ICD-10-CM | POA: Insufficient documentation

## 2022-12-26 DIAGNOSIS — G893 Neoplasm related pain (acute) (chronic): Secondary | ICD-10-CM | POA: Diagnosis not present

## 2022-12-26 DIAGNOSIS — R066 Hiccough: Secondary | ICD-10-CM | POA: Diagnosis not present

## 2022-12-26 DIAGNOSIS — F419 Anxiety disorder, unspecified: Secondary | ICD-10-CM | POA: Diagnosis not present

## 2022-12-26 DIAGNOSIS — E039 Hypothyroidism, unspecified: Secondary | ICD-10-CM | POA: Insufficient documentation

## 2022-12-26 DIAGNOSIS — K219 Gastro-esophageal reflux disease without esophagitis: Secondary | ICD-10-CM | POA: Insufficient documentation

## 2022-12-26 DIAGNOSIS — K5903 Drug induced constipation: Secondary | ICD-10-CM

## 2022-12-26 DIAGNOSIS — Z5112 Encounter for antineoplastic immunotherapy: Secondary | ICD-10-CM | POA: Insufficient documentation

## 2022-12-26 DIAGNOSIS — C9000 Multiple myeloma not having achieved remission: Secondary | ICD-10-CM

## 2022-12-26 DIAGNOSIS — T402X5A Adverse effect of other opioids, initial encounter: Secondary | ICD-10-CM

## 2022-12-26 LAB — CBC WITH DIFFERENTIAL (CANCER CENTER ONLY)
Abs Immature Granulocytes: 0.01 10*3/uL (ref 0.00–0.07)
Basophils Absolute: 0 10*3/uL (ref 0.0–0.1)
Basophils Relative: 0 %
Eosinophils Absolute: 0 10*3/uL (ref 0.0–0.5)
Eosinophils Relative: 1 %
HCT: 35.4 % — ABNORMAL LOW (ref 39.0–52.0)
Hemoglobin: 12.3 g/dL — ABNORMAL LOW (ref 13.0–17.0)
Immature Granulocytes: 0 %
Lymphocytes Relative: 35 %
Lymphs Abs: 2 10*3/uL (ref 0.7–4.0)
MCH: 34.9 pg — ABNORMAL HIGH (ref 26.0–34.0)
MCHC: 34.7 g/dL (ref 30.0–36.0)
MCV: 100.6 fL — ABNORMAL HIGH (ref 80.0–100.0)
Monocytes Absolute: 0.5 10*3/uL (ref 0.1–1.0)
Monocytes Relative: 8 %
Neutro Abs: 3.2 10*3/uL (ref 1.7–7.7)
Neutrophils Relative %: 56 %
Platelet Count: 182 10*3/uL (ref 150–400)
RBC: 3.52 MIL/uL — ABNORMAL LOW (ref 4.22–5.81)
RDW: 14.6 % (ref 11.5–15.5)
WBC Count: 5.6 10*3/uL (ref 4.0–10.5)
nRBC: 0 % (ref 0.0–0.2)

## 2022-12-26 LAB — PRETREATMENT RBC PHENOTYPE: DAT, IgG: NEGATIVE

## 2022-12-26 LAB — TYPE AND SCREEN
ABO/RH(D): A NEG
Antibody Screen: NEGATIVE

## 2022-12-26 MED ORDER — SODIUM CHLORIDE 0.9 % IV SOLN
Freq: Once | INTRAVENOUS | Status: AC
  Filled 2022-12-26: qty 250

## 2022-12-26 MED ORDER — SODIUM CHLORIDE 0.9 % IV SOLN
20.0000 mg | Freq: Once | INTRAVENOUS | Status: AC
  Administered 2022-12-26: 20 mg via INTRAVENOUS
  Filled 2022-12-26: qty 2

## 2022-12-26 MED ORDER — ACETAMINOPHEN 325 MG PO TABS
650.0000 mg | ORAL_TABLET | Freq: Once | ORAL | Status: AC
  Administered 2022-12-26: 650 mg via ORAL
  Filled 2022-12-26: qty 2

## 2022-12-26 MED ORDER — DIPHENHYDRAMINE HCL 25 MG PO CAPS
50.0000 mg | ORAL_CAPSULE | Freq: Once | ORAL | Status: AC
  Administered 2022-12-26: 50 mg via ORAL
  Filled 2022-12-26: qty 2

## 2022-12-26 MED ORDER — DARATUMUMAB-HYALURONIDASE-FIHJ 1800-30000 MG-UT/15ML ~~LOC~~ SOLN
1800.0000 mg | Freq: Once | SUBCUTANEOUS | Status: AC
  Administered 2022-12-26: 1800 mg via SUBCUTANEOUS
  Filled 2022-12-26: qty 15

## 2022-12-26 MED ORDER — NALOXEGOL OXALATE 12.5 MG PO TABS: 12.5000 mg | ORAL_TABLET | Freq: Every day | ORAL | 0 refills | Status: AC

## 2022-12-26 MED ORDER — MONTELUKAST SODIUM 10 MG PO TABS
10.0000 mg | ORAL_TABLET | Freq: Once | ORAL | Status: AC
  Administered 2022-12-26: 10 mg via ORAL
  Filled 2022-12-26: qty 1

## 2022-12-26 NOTE — Patient Instructions (Signed)
[facility]  Discharge Instructions: Thank you for choosing [facility] to provide your oncology and hematology care.  If you have a lab appointment with the Cancer Center, please go directly to the Cancer Center and check in at the registration area.  Wear comfortable clothing and clothing appropriate for easy access to any Portacath or PICC line.   We strive to give you quality time with your provider. You may need to reschedule your appointment if you arrive late (15 or more minutes).  Arriving late affects you and other patients whose appointments are after yours.  Also, if you miss three or more appointments without notifying the office, you may be dismissed from the clinic at the provider's discretion.      For prescription refill requests, have your pharmacy contact our office and allow 72 hours for refills to be completed.    Today you received the following chemotherapy and/or immunotherapy agents Darzalex Faspro      To help prevent nausea and vomiting after your treatment, we encourage you to take your nausea medication as directed.  BELOW ARE SYMPTOMS THAT SHOULD BE REPORTED IMMEDIATELY: *FEVER GREATER THAN 100.4 F (38 C) OR HIGHER *CHILLS OR SWEATING *NAUSEA AND VOMITING THAT IS NOT CONTROLLED WITH YOUR NAUSEA MEDICATION *UNUSUAL SHORTNESS OF BREATH *UNUSUAL BRUISING OR BLEEDING *URINARY PROBLEMS (pain or burning when urinating, or frequent urination) *BOWEL PROBLEMS (unusual diarrhea, constipation, pain near the anus) TENDERNESS IN MOUTH AND THROAT WITH OR WITHOUT PRESENCE OF ULCERS (sore throat, sores in mouth, or a toothache) UNUSUAL RASH, SWELLING OR PAIN  UNUSUAL VAGINAL DISCHARGE OR ITCHING   Items with * indicate a potential emergency and should be followed up as soon as possible or go to the Emergency Department if any problems should occur.  Please show the CHEMOTHERAPY ALERT CARD or IMMUNOTHERAPY ALERT CARD at  check-in to the Emergency Department and triage nurse.  Should you have questions after your visit or need to cancel or reschedule your appointment, please contact [facility]  [phone] and follow the prompts.  Office hours are 8:00 a.m. to 4:30 p.m. Monday - Friday. Please note that voicemails left after 4:00 p.m. may not be returned until the following business day.  We are closed weekends and major holidays. You have access to a nurse at all times for urgent questions. Please call the main number to the clinic [phone] and follow the prompts.  For any non-urgent questions, you may also contact your provider using MyChart. We now offer e-Visits for anyone 42 and older to request care online for non-urgent symptoms. For details visit [email or web address].   Also download the MyChart app! Go to the app store, search "MyChart", open the app, select [place], and log in with your MyChart username and password.   Daratumumab; Hyaluronidase Injection What is this medication? DARATUMUMAB; HYALURONIDASE (dar a toom ue mab; hye al ur ON i dase) treats multiple myeloma, a type of bone marrow cancer. Daratumumab works by blocking a protein that causes cancer cells to grow and multiply. This helps to slow or stop the spread of cancer cells. Hyaluronidase works by increasing the absorption of other medications in the body to help them work better. This medication may also be used treat amyloidosis, a condition that causes the buildup of a protein (amyloid) in your body. It works by reducing the buildup of this protein, which decreases symptoms. It is a combination medication that contains a  monoclonal antibody. This medicine may be used for other purposes; ask your health care provider or pharmacist if you have questions. COMMON BRAND NAME(S): DARZALEX FASPRO What should I tell my care team before I take this medication? They need to know if you have any of these  conditions: Heart disease Infection, such as chickenpox, cold sores, herpes, hepatitis B Lung or breathing disease An unusual or allergic reaction to daratumumab, hyaluronidase, other medications, foods, dyes, or preservatives Pregnant or trying to get pregnant Breast-feeding How should I use this medication? This medication is injected under the skin. It is given by your care team in a hospital or clinic setting. Talk to your care team about the use of this medication in children. Special care may be needed. Overdosage: If you think you have taken too much of this medicine contact a poison control center or emergency room at once. NOTE: This medicine is only for you. Do not share this medicine with others. What if I miss a dose? Keep appointments for follow-up doses. It is important not to miss your dose. Call your care team if you are unable to keep an appointment. What may interact with this medication? Interactions have not been studied. This list may not describe all possible interactions. Give your health care provider a list of all the medicines, herbs, non-prescription drugs, or dietary supplements you use. Also tell them if you smoke, drink alcohol, or use illegal drugs. Some items may interact with your medicine. What should I watch for while using this medication? Your condition will be monitored carefully while you are receiving this medication. This medication can cause serious allergic reactions. To reduce your risk, your care team may give you other medication to take before receiving this one. Be sure to follow the directions from your care team. This medication can affect the results of blood tests to match your blood type. These changes can last for up to 6 months after the final dose. Your care team will do blood tests to match your blood type before you start treatment. Tell all of your care team that you are being treated with this medication before receiving a blood  transfusion. This medication can affect the results of some tests used to determine treatment response; extra tests may be needed to evaluate response. Talk to your care team if you wish to become pregnant or think you are pregnant. This medication can cause serious birth defects if taken during pregnancy and for 3 months after the last dose. A reliable form of contraception is recommended while taking this medication and for 3 months after the last dose. Talk to your care team about effective forms of contraception. Do not breast-feed while taking this medication. What side effects may I notice from receiving this medication? Side effects that you should report to your care team as soon as possible: Allergic reactions--skin rash, itching, hives, swelling of the face, lips, tongue, or throat Heart rhythm changes--fast or irregular heartbeat, dizziness, feeling faint or lightheaded, chest pain, trouble breathing Infection--fever, chills, cough, sore throat, wounds that don't heal, pain or trouble when passing urine, general feeling of discomfort or being unwell Infusion reactions--chest pain, shortness of breath or trouble breathing, feeling faint or lightheaded Sudden eye pain or change in vision such as blurry vision, seeing halos around lights, vision loss Unusual bruising or bleeding Side effects that usually do not require medical attention (report to your care team if they continue or are bothersome): Constipation Diarrhea Fatigue Nausea Pain, tingling,  or numbness in the hands or feet Swelling of the ankles, hands, or feet This list may not describe all possible side effects. Call your doctor for medical advice about side effects. You may report side effects to FDA at 1-800-FDA-1088. Where should I keep my medication? This medication is given in a hospital or clinic. It will not be stored at home. NOTE: This sheet is a summary. It may not cover all possible information. If you have  questions about this medicine, talk to your doctor, pharmacist, or health care provider.  2024 Elsevier/Gold Standard (2021-11-13 00:00:00)

## 2022-12-26 NOTE — Progress Notes (Signed)
Entered prior auth for Movantik 12.5mg  po qd x10days per covermymeds : The patient currently has access to the requested medication and a Prior Authorization is not needed for the patient/medication. I let Dr A know.

## 2022-12-26 NOTE — Progress Notes (Signed)
Patient is having some constipation and he isn't getting much relief. Patient Is having an issue with burping. Patient is also suffering with Painful breathing from his abdomen and lower back. The pharmacy never filled the lidocaine patches so they purchased the% patches over the counter, which they seem to be helping much either.

## 2022-12-26 NOTE — Progress Notes (Signed)
[facility] CONSULT NOTE  Patient Care Team: [name], MD as PCP - General (Internal Medicine)   CANCER STAGING   Cancer Staging  Multiple myeloma [facility]) Staging form: Plasma Cell Myeloma and Plasma Cell Disorders, AJCC 8th Edition - Clinical stage from 12/05/2022: RISS Stage II (Beta-2-microglobulin (mg/L): 3.7, Albumin (g/dL): 3.6, ISS: Stage II, High-risk cytogenetics: Absent, LDH: Normal) - Signed by [name], MD on 12/26/2022 Stage prefix: Initial diagnosis Beta 2 microglobulin range (mg/L): 3.5 to 5.49 Albumin range (g/dL): Greater than or equal to 3.5 Cytogenetics: 1q addition, Other mutation   ASSESSMENT & PLAN:  Melvin Gilmore 82 y.o. male with pmh of hypertension, hyperlipidemia, anxiety, GERD, BPH, hypothyroidism, prostate cancer s/p HIFU was seen by primary on November 13, 2022 for acute onset of lower back pain.  Was referred to medical oncology for finding of L1 lesion suspicious for metastasis.  # Multiple myeloma, RISS Stage II -MRI thoracic and lumbar spine with and without contrast was reviewed. Showed unchanged subacute compression fracture at T12.  Focal enhancing marrow replacing lesions in T6, T8, T5, T2 all suspicious for metastatic disease.  L1 lesion measuring 16 mm.  Multilevel degenerative changes present.  -SPEP/IFE showed biclonal IgA kappa paraprotein with M spike 2.1 g/dL and second spike at 0.4 g/dL.  Kappa 453, lambda 6.8 with a ratio of 66.7.  Iron panel and B12 are normal.  PSA 3.44.  CBC showed mild anemia 13.4, chronic.  On CMP normal creatinine and calcium levels.  Elevated total protein 8.8.  LDH normal.  Beta-2 microglobulin 3.7.  Albumin 3.6.  24-hour urine/UPEP showed 1.1 g protein with no M spike.  -BMBx Hypercellular marrow with 60% plasma cells.  IHC positive for CD138/ mum 1.  Kappa restricted consistent with multiple myeloma. L1 vertebral body biopsy showed numerous plasma cells. Cytogenetics - del (13q) and gain 1.     -PET/CT showed activity in L1 and left eighth rib concerning for myeloma.  -Plan to start Dara-RD regimen today.  Labs reviewed and acceptable for treatment.  Will proceed with daratumumab injection 1800 mg.  His creatinine clearance is 54, so recommended maximum dose of Revlimid is 15 mg.  I will start with 10 mg 3 weeks on and 1 week off to assess for tolerability.  He will get dexamethasone 20 mg IV with the treatment today.  Continue with aspirin 81 mg and acyclovir 400 mg twice daily.  # T12 pathological fracture # Cancer-related pain - s/p kyphoplasty by Dr. Elijio Miles on 12/06/22.  Initially had improvement but now pain is worse. -Vicodin did not work.  Was switched to oxycodone last week.  He is taking 5 mg every 3-4 hours which is mildly helping with pain but has severe constipation. -Urgent referral to radiation oncology for palliative RT to spine.  # Opioid-induced constipation -Patient in discomfort. Last BM 5 days ago. Passing gas. Has tried senna, MiraLAX and lactulose and glycerin suppository. - Will do a trial of Movantik 12.5 mg daily until has a bowel movement.  Patient was advised to continue with senna, MiraLAX and lactulose also.  # History of prostate cancer - follows with Dr. Evelene Croon, urology for elevated PSA.  MRI prostate showed 1 mm category 5 lesion of anterior transition zone.  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 s/p HIFU of the prostate on 02/07/2022.  PSA level from 10/08/2022 was 3.34.  -PSA from 11/21/2022 3.44.  # Normocytic anemia -Chronic, hemoglobin 13.4.   -  Iron panel, B12 and folate normal.   Orders Placed This Encounter  Procedures   CBC with Differential (Cancer Center Only)    Standing Status:   Future    Standing Expiration Date:   01/09/2024   RTC in 1 week for MD visit, labs, cycle 1 day 8 daratumumab.  The total time spent in the appointment was 30 minutes encounter with patients  including review of chart and various tests results, discussions about plan of care and coordination of care plan   All questions were answered. The patient knows to call the clinic with any problems, questions or concerns. No barriers to learning was detected.  [name], MD 6/6/202410:40 AM   HISTORY OF PRESENTING ILLNESS:  Melvin Gilmore 82 y.o. male with past medical history of hypertension, hyperlipidemia, anxiety, GERD, BPH, hypothyroidism, prostate cancer s/p HIFU was seen by primary on November 13, 2022 for acute onset of lower back pain.  Patient reports that he was sitting outside and had violent sneeze which was followed by severe back pain.  He has fallen allergies.  Had x-rays followed by MRI thoracic spine without contrast.  It showed compression fracture of T12 superior endplate with surrounding marrow edema likely subacute.  15 mm T1 hypointense marrow lesion in the anterior aspect of L1 suspicious for metastasis.  Further characterization with postcontrast MRI recommended.  Prostate cancer-follows with Dr. Evelene Croon, urology for elevated PSA.  MRI prostate showed 1 mm category 5 lesion of anterior transition zone.  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 s/p HIFU of the prostate on 02/07/2022.  PSA level from 10/08/2022 was 3.34.  Interval history Patient seen today accompanied with his wife to prior to cycle 1 day 1 of daratumumab.  Patient reports pain in the right back which is radiating down to the right hip.  He is taking oxycodone 1 tablet 3 to 4 hours.  Which is somewhat helping with pain but has severe constipation which is very bothersome to him.  He is taking senna and MiraLAX.  Yesterday his wife called and lactulose was added.  He took it once a day.  Has not had any bowel movement for 5 days now.  Has some discomfort in stomach.  Passing gas.  Abdomen is soft to touch.  Has tried suppository last week which also  does not help.  I have reviewed his chart and materials related to his cancer extensively and collaborated history with the patient. Summary of oncologic history is as follows: Oncology History  Multiple myeloma (HCC)  12/05/2022 Cancer Staging   Staging form: Plasma Cell Myeloma and Plasma Cell Disorders, AJCC 8th Edition - Clinical stage from 12/05/2022: RISS Stage II (Beta-2-microglobulin (mg/L): 3.7, Albumin (g/dL): 3.6, ISS: Stage II, High-risk cytogenetics: Absent, LDH: Normal) - Signed by [name], MD on 12/26/2022 Stage prefix: Initial diagnosis Beta 2 microglobulin range (mg/L): 3.5 to 5.49 Albumin range (g/dL): Greater than or equal to 3.5 Cytogenetics: 1q addition, Other mutation   12/13/2022 Initial Diagnosis   Multiple myeloma (HCC)   12/26/2022 -  Chemotherapy   Patient is on Treatment Plan : MYELOMA NEWLY DIAGNOSED Daratumumab IV + Lenalidomide + Dexamethasone Weekly (DaraRd) q28d       MEDICAL HISTORY:  Past Medical History:  Diagnosis Date   Anxiety    a.) on BZO (alprazolam) PRN   Cervicalgia    Chest pain, non-cardiac    Elevated PSA    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   Distal with  moderate free air surrounding Hiatal Hernia   Gallbladder polyp    Gastritis    GERD (gastroesophageal reflux disease)    History of hiatal hernia    HLD (hyperlipidemia)    HTN (hypertension)    Hypothyroidism    Meniere disease    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   Prostate cancer (HCC) 2023   Seasonal allergies    Skin cancer    a.) ears   Wears partial dentures    lower    SURGICAL HISTORY: Past Surgical History:  Procedure Laterality Date   APPENDECTOMY     CATARACT EXTRACTION W/PHACO Right 01/15/2016   Procedure: CATARACT EXTRACTION PHACO AND INTRAOCULAR LENS PLACEMENT (IOC);  Surgeon: Sherald Hess, MD;  Location: [facility];  Service: Ophthalmology;  Laterality: Right;  RIGHT CALL CELL PHONE WITH TIME   CHOLECYSTECTOMY      COLONOSCOPY     COLONOSCOPY WITH ESOPHAGOGASTRODUODENOSCOPY (EGD)     ESOPHAGOGASTRODUODENOSCOPY  07/2021   ESOPHAGOGASTRODUODENOSCOPY (EGD) WITH PROPOFOL N/A 09/23/2017   Procedure: ESOPHAGOGASTRODUODENOSCOPY (EGD) WITH PROPOFOL;  Surgeon: Toledo, Boykin Nearing, MD;  Location: [facility];  Service: Gastroenterology;  Laterality: N/A;   HIGH INTENSITY FOCUSED ULTRASOUND (HIFU) OF THE PROSTATE N/A 02/07/2022   Procedure: HIGH INTENSITY FOCUSED ULTRASOUND (HIFU) OF THE PROSTATE;  Surgeon: Orson Ape, MD;  Location: [facility];  Service: Urology;  Laterality: N/A;   HYDROCELE EXCISION / REPAIR     IR KYPHO THORACIC WITH BONE BIOPSY  12/06/2022   IR RADIOLOGIST EVAL & MGMT  11/21/2022   LASIX RT EYE     PROSTATE BIOPSY N/A 01/03/2022   Procedure: PROSTATE BIOPSY  Addison Tangeman;  Surgeon: Orson Ape, MD;  Location: [facility];  Service: Urology;  Laterality: N/A;   ROTATOR CUFF REPAIR Left 2001    SOCIAL HISTORY: Social History   Socioeconomic History   Marital status: Married    Spouse name: Not on file   Number of children: Not on file   Years of education: Not on file   Highest education level: Not on file  Occupational History   Not on file  Tobacco Use   Smoking status: Never   Smokeless tobacco: Never  Vaping Use   Vaping Use: Never used  Substance and Sexual Activity   Alcohol use: Yes    Alcohol/week: 7.0 standard drinks of alcohol    Types: 7 Glasses of wine per week    Comment: wine occ   Drug use: No   Sexual activity: Not on file  Other Topics Concern   Not on file  Social History Narrative   Not on file   Social Determinants of Health   Financial Resource Strain: Low Risk  (11/21/2022)   Overall Financial Resource Strain (CARDIA)    Difficulty of Paying Living Expenses: Not hard at all  Food Insecurity: No Food Insecurity (11/21/2022)   Hunger Vital Sign    Worried About Running Out of Food in the Last Year: Never true    Ran Out of Food in the Last Year: Never  true  Transportation Needs: Not on file  Physical Activity: Not on file  Stress: Not on file  Social Connections: Not on file  Intimate Partner Violence: Not At Risk (11/21/2022)   Humiliation, Afraid, Rape, and Kick questionnaire    Fear of Current or Ex-Partner: No    Emotionally Abused: No    Physically Abused: No    Sexually Abused: No    FAMILY HISTORY:  Family History  Problem Relation Age of Onset   Pneumonia Father     ALLERGIES:  is allergic to amoxicillin-pot clavulanate and meloxicam.  MEDICATIONS:  Current Outpatient Medications  Medication Sig Dispense Refill   acetaminophen (TYLENOL) 500 MG tablet Take 500 mg by mouth every 6 (six) hours as needed.     acyclovir (ZOVIRAX) 400 MG tablet Take 400 mg by mouth 2 (two) times daily.     ALPRAZolam (XANAX) 0.5 MG tablet Take 0.25-0.5 mg by mouth at bedtime as needed for anxiety.     aspirin EC 81 MG tablet Take 1 tablet (81 mg total) by mouth daily. Swallow whole. 90 tablet 2   cetirizine (ZYRTEC) 10 MG tablet Take 10 mg by mouth as needed for allergies.     docusate sodium (COLACE) 100 MG capsule Take 2 capsules (200 mg total) by mouth 2 (two) times daily. 120 capsule 3   fluticasone (FLONASE) 50 MCG/ACT nasal spray Place 1 spray into both nostrils daily.     lactulose (CHRONULAC) 10 GM/15ML solution Take 10-20 GM (15-30 ml) one - two times a day as needed for constipation 475 mL 0   levothyroxine (SYNTHROID, LEVOTHROID) 50 MCG tablet Take 50 mcg by mouth daily before breakfast.     naloxegol oxalate (MOVANTIK) 12.5 MG TABS tablet Take 1 tablet (12.5 mg total) by mouth daily for 10 days. 10 tablet 0   omeprazole (PRILOSEC) 40 MG capsule Take 40 mg by mouth every morning.     ondansetron (ZOFRAN) 8 MG tablet Take 1 tablet (8 mg total) by mouth every 8 (eight) hours as needed for nausea or vomiting. 30 tablet 1   oxyCODONE (OXY IR/ROXICODONE) 5 MG immediate release tablet Take 1-2 tablets (5-10 mg total) by mouth every 4 (four)  hours as needed for severe pain. 60 tablet 0   Plant Sterols and Stanols (CHOLESTOFF) 450 MG TABS Take 1 tablet by mouth every morning.     predniSONE (DELTASONE) 10 MG tablet Take 1 tablet (10mg ) daily x 1 week, then take 5mg  (1/2 tablet) daily x 1 week, then stop. 15 tablet 0   Probiotic Product (PROBIOTIC ADVANCED PO) Take 1 tablet by mouth at bedtime.     senna (SENOKOT) 8.6 MG TABS tablet Take 2 tablets (17.2 mg total) by mouth daily as needed for mild constipation. 60 tablet 2   lenalidomide (REVLIMID) 10 MG capsule Take 1 capsule (10 mg total) by mouth daily. Take for 21 days, then hold for 7 days. Repeat every 28 days. (Patient not taking: Reported on 12/18/2022) 21 capsule 0   lidocaine (LIDODERM) 5 % Place 1 patch onto the skin daily for 14 days. Remove & Discard patch within 12 hours or as directed by MD (Patient not taking: Reported on 12/18/2022) 14 patch 0   tadalafil (CIALIS) 5 MG tablet Take 5 mg by mouth daily as needed for erectile dysfunction. (Patient not taking: Reported on 12/18/2022)     No current facility-administered medications for this visit.    REVIEW OF SYSTEMS:   Pertinent information mentioned in HPI All other systems were reviewed with the patient and are negative.  PHYSICAL EXAMINATION: ECOG PERFORMANCE STATUS: 1 - Symptomatic but completely ambulatory  Vitals:   12/26/22 0947  BP: 122/88  Pulse: 90  Temp: (!) 97 F (36.1 C)  SpO2: 100%    Filed Weights   12/26/22 0947  Weight: 145 lb (65.8 kg)     GENERAL:alert, no distress and comfortable SKIN: skin color, texture,  turgor are normal, no rashes or significant lesions EYES: normal, conjunctiva are pink and non-injected, sclera clear OROPHARYNX:no exudate, no erythema and lips, buccal mucosa, and tongue normal  NECK: supple, thyroid normal size, non-tender, without nodularity LYMPH:  no palpable lymphadenopathy in the cervical, axillary or inguinal LUNGS: clear to auscultation and percussion with  normal breathing effort HEART: regular rate & rhythm and no murmurs and no lower extremity edema ABDOMEN:abdomen soft, non-tender and normal bowel sounds Musculoskeletal:no cyanosis of digits and no clubbing  PSYCH: alert & oriented x 3 with fluent speech NEURO: no focal motor/sensory deficits  LABORATORY DATA:  I have reviewed the data as listed Lab Results  Component Value Date   WBC 5.6 12/26/2022   HGB 12.3 (L) 12/26/2022   HCT 35.4 (L) 12/26/2022   MCV 100.6 (H) 12/26/2022   PLT 182 12/26/2022   Recent Labs    02/07/22 0631 12/13/22 1454  NA 133* 132*  K 3.7 4.8  CL 100 93*  CO2 26 27  GLUCOSE 93 104*  BUN 13 20  CREATININE 1.05 1.04  CALCIUM 9.5 9.5  GFRNONAA >60 >60  PROT  --  9.2*  ALBUMIN  --  3.6  AST  --  23  ALT  --  17  ALKPHOS  --  73  BILITOT  --  0.5    RADIOGRAPHIC STUDIES: I have personally reviewed the radiological images as listed and agreed with the findings in the report. NM PET Image Initial (PI) Whole Body  Result Date: 12/25/2022 CLINICAL DATA:  Initial treatment strategy for multiple myeloma. EXAM: NUCLEAR MEDICINE PET WHOLE BODY TECHNIQUE: 8.6 mCi F-18 FDG was injected intravenously. Full-ring PET imaging was performed from the head to foot after the radiotracer. CT data was obtained and used for attenuation correction and anatomic localization. Fasting blood glucose: 102 mg/dl COMPARISON:  MR lumbar spine 11/28/2022 FINDINGS: Mediastinal blood pool activity: SUV max 2.1 HEAD/NECK: No hypermetabolic activity in the scalp. No hypermetabolic cervical lymph nodes. Incidental CT findings: none CHEST: No hypermetabolic mediastinal or hilar nodes. No suspicious pulmonary nodules on the CT scan. Incidental CT findings: Chronic interstitial thickening in the lungs. ABDOMEN/PELVIS: No abnormal hypermetabolic activity within the liver, pancreas, adrenal glands, or spleen. No hypermetabolic lymph nodes in the abdomen or pelvis. Incidental CT findings: none  SKELETON: Focal radiotracer activity within the L1 vertebral body with SUV max equal 5.1 (image 209). This activity is 1 vertebral body level lower than the vertebroplasty for compression fracture at T12. Mild radiotracer activity associated with the superior endplate of the L 3 vertebral body with SUV max equal 3.8. Metabolic activity associated the posterior LEFT eighth rib with SUV max equal 3.9 on image 77. No clear CT changes. Focal uptake in the medial aspect of the RIGHT eighth rib at the costovertebral junction. Favor degenerative uptake. Incidental CT findings: No sclerotic or lytic lesions on CT imaging. EXTREMITIES: No abnormal hypermetabolic activity in the lower extremities. Incidental CT findings: none IMPRESSION: 1. Focal uptake in the L1 vertebral body concern for multiple myeloma however a traumatic compression fractures also in the differential. Augmentation of the vertebral body one level above. 2. Uptake in the posterior LEFT eighth rib is concerning for myeloma. 3. Two smaller lesions are indeterminate for degenerative change versus myeloma. 4. No on typical lytic lesions on the CT portion exam. Electronically Signed   By: Genevive Bi M.D.   On: 12/25/2022 13:20   IR KYPHO THORACIC WITH BONE BIOPSY  Addendum Date: 12/17/2022  ADDENDUM REPORT: 12/17/2022 16:11 ADDENDUM: The operated vertebral body was T12, as amended in portions of the report as detailed below. PROCEDURE: The patient was positioned prone on the exam table. A unipedicular access was planned. Skin entry site was marked overlying the T12 vertebral body using fluoroscopy. IMPRESSION: 1. Successful T12 vertebral body augmentation using balloon kyphoplasty. Electronically Signed   By: Olive Bass M.D.   On: 12/17/2022 16:11   Result Date: 12/17/2022 INDICATION: T12 compression fracture EXAM: T12 vertebral body augmentation using balloon kyphoplasty COMPARISON:  None Available. MEDICATIONS: Documented in the EMR  ANESTHESIA/SEDATION: Moderate (conscious) sedation was employed during this procedure. A total of Versed 2.5 mg and Fentanyl 75 mcg was administered intravenously by the radiology nurse. Total intra-service moderate Sedation Time: 35 minutes. The patient's level of consciousness and vital signs were monitored continuously by radiology nursing throughout the procedure under my direct supervision. FLUOROSCOPY: Radiation Exposure Index (as provided by the fluoroscopic device): 5.6 minutes (44 mGy) COMPLICATIONS: None immediate. PROCEDURE: Informed written consent was obtained from the patient after a thorough discussion of the procedural risks, benefits and alternatives. All questions were addressed. Maximal Sterile Barrier Technique was utilized including caps, mask, sterile gowns, sterile gloves, sterile drape, hand hygiene and skin antiseptic. A timeout was performed prior to the initiation of the procedure. The patient was positioned prone on the exam table. A unipedicular access was planned. Skin entry site was marked overlying the L1 vertebral body using fluoroscopy. The overlying skin was then prepped and draped in the standard sterile fashion. Local analgesia was obtained with 1% lidocaine. Attention was turned to the right side. Under fluoroscopic guidance, a 10 gauge introducer needle was advanced towards the lateral margin of the pedicle. Using multiple projections, the introducer needle was advanced towards the posterior margin of the vertebral body via a transpedicular approach. The inner needle was then removed, and the drill was advanced towards the anterior margin of the vertebral body. A Kyphon 15 mm inflatable bone tamp was then advanced through the transpedicular access needle and positioned within the mid vertebral body. Kyphoplasty was then performed, ensuring that the balloon contours stayed within the vertebral body margins. The balloon was then deflated and removed, followed by advancement of  the bone filler device and the instillation of acrylic bone cement with excellent filling in the AP and lateral projections. No extravasation was noted in the disk spaces or posteriorly into the spinal canal. No epidural venous contamination was seen. At the end of the procedure, the introducer cannula and bone filler device were removed without difficulty. A clean dressing was placed after hemostasis. The patient tolerated all aspects of the procedure well, and was transferred to recovery in stable condition. IMPRESSION: 1. Successful L1 vertebral body augmentation using balloon kyphoplasty. If the patient has known osteoporosis, recommend treatment as clinically indicated. If the patient's bone density status is unknown, DEXA scan is recommended. Electronically Signed: By: Olive Bass M.D. On: 12/06/2022 10:48   DG HIP UNILAT WITH PELVIS 2-3 VIEWS RIGHT  Result Date: 12/14/2022 CLINICAL DATA:  Pain. EXAM: DG HIP (WITH OR WITHOUT PELVIS) 3V RIGHT COMPARISON:  None Available. FINDINGS: Bilateral hip osteophytes and joint space narrowing. Pelvic ring intact. No fracture, dislocation or subluxation. IMPRESSION: Bilateral hip degenerative changes. Electronically Signed   By: Layla Maw M.D.   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 CT-guided bone marrow aspiration  and core biopsy 2. CT-guided core needle biopsy of focal bone lesion of the L1 vertebral body MEDICATIONS: None. ANESTHESIA/SEDATION: Moderate (conscious) sedation was employed during this procedure. A total of Versed 2 mg and Fentanyl 100 mcg was administered intravenously. Moderate Sedation Time: 55 minutes. The patient's level of consciousness and vital signs were monitored continuously by radiology nursing throughout the procedure under my direct supervision. FLUOROSCOPY TIME:  N/a COMPLICATIONS: None  immediate. PROCEDURE: Informed written consent was obtained from the patient after a thorough discussion of the procedural risks, benefits and alternatives. All questions were addressed. Maximal Sterile Barrier Technique was utilized including caps, mask, sterile gowns, sterile gloves, sterile drape, hand hygiene and skin antiseptic. A timeout was performed prior to the initiation of the procedure. Bone marrow aspiration and core biopsy: The patient was placed prone on the CT exam table. Limited CT of the pelvis was performed for planning purposes. Skin entry site was marked, and the overlying skin was prepped and draped in the standard sterile fashion. Local analgesia was obtained with 1% lidocaine. Using CT guidance, an 11 gauge needle was advanced just deep to the cortex of the right posterior ilium. Subsequently, bone marrow aspiration and core biopsy were performed. Specimens were submitted to lab/pathology for handling. Hemostasis was achieved with manual pressure, and a clean dressing was placed. The patient tolerated the procedure well without immediate complication. L1 vertebral body lesion core biopsy: The patient was placed prone on the exam table. Limited CT of the lumbar spine was performed for planning purposes. This demonstrated no discernible bone lesion, and therefore biopsy route was planned based on anatomic landmarks from corresponding MRI lumbar spine. Skin entry site was marked, and the overlying skin was prepped and draped in the standard sterile fashion. Local analgesia was obtained with 1% lidocaine. Using intermittent CT fluoroscopy, an 11 gauge bone biopsy needle was advanced towards the expected location of the L1 vertebral body lesion using anatomic landmarks. Subsequently, a single 11 gauge core was obtained. Specimens were submitted in formalin to pathology for further handling. Limited postprocedure imaging demonstrated no complicating feature. The patient tolerated the procedure  well, and was transferred to recovery in stable condition. IMPRESSION: 1. Successful bone marrow aspiration and core biopsy of the right posterior ilium. 2. Successful CT-guided core needle biopsy of focal lesion of the L1 vertebral body. Electronically Signed   By: Olive Bass M.D.   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 CT-guided bone marrow aspiration and core biopsy 2. CT-guided core needle biopsy of focal bone lesion of the L1 vertebral body MEDICATIONS: None. ANESTHESIA/SEDATION: Moderate (conscious) sedation was employed during this procedure. A total of Versed 2 mg and Fentanyl 100 mcg was administered intravenously. Moderate Sedation Time: 55 minutes. The patient's level of consciousness and vital signs were monitored continuously by radiology nursing throughout the procedure under my direct supervision. FLUOROSCOPY TIME:  N/a COMPLICATIONS: None immediate. PROCEDURE: Informed written consent was obtained from the patient after a thorough discussion of the procedural risks, benefits and alternatives. All questions were addressed. Maximal Sterile Barrier Technique was utilized including caps, mask, sterile gowns, sterile gloves, sterile drape, hand hygiene and skin antiseptic. A timeout was performed prior to the initiation of the procedure. Bone marrow aspiration and core biopsy: The patient was placed prone on the CT exam table. Limited CT of the pelvis was performed for planning purposes. Skin  entry site was marked, and the overlying skin was prepped and draped in the standard sterile fashion. Local analgesia was obtained with 1% lidocaine. Using CT guidance, an 11 gauge needle was advanced just deep to the cortex of the right posterior ilium. Subsequently, bone marrow aspiration and core biopsy were performed. Specimens were submitted to lab/pathology for  handling. Hemostasis was achieved with manual pressure, and a clean dressing was placed. The patient tolerated the procedure well without immediate complication. L1 vertebral body lesion core biopsy: The patient was placed prone on the exam table. Limited CT of the lumbar spine was performed for planning purposes. This demonstrated no discernible bone lesion, and therefore biopsy route was planned based on anatomic landmarks from corresponding MRI lumbar spine. Skin entry site was marked, and the overlying skin was prepped and draped in the standard sterile fashion. Local analgesia was obtained with 1% lidocaine. Using intermittent CT fluoroscopy, an 11 gauge bone biopsy needle was advanced towards the expected location of the L1 vertebral body lesion using anatomic landmarks. Subsequently, a single 11 gauge core was obtained. Specimens were submitted in formalin to pathology for further handling. Limited postprocedure imaging demonstrated no complicating feature. The patient tolerated the procedure well, and was transferred to recovery in stable condition. IMPRESSION: 1. Successful bone marrow aspiration and core biopsy of the right posterior ilium. 2. Successful CT-guided core needle biopsy of focal lesion of the L1 vertebral body. Electronically Signed   By: Olive Bass M.D.   On: 12/05/2022 13:14   MR Lumbar Spine W Wo Contrast  Result Date: 11/29/2022 CLINICAL DATA:  Compression fracture, thoracic, known malignancy compression fracture/ new lesion; compression fracture/ new lesion. EXAM: MRI THORACIC AND LUMBAR SPINE WITHOUT AND WITH CONTRAST TECHNIQUE: Multiplanar and multiecho pulse sequences of the thoracic and lumbar spine were obtained without and with intravenous contrast. CONTRAST:  6mL GADAVIST GADOBUTROL 1 MMOL/ML IV SOLN COMPARISON:  MRI thoracic spine 11/15/2022. MRI lumbar spine 03/08/2006. FINDINGS: MRI THORACIC SPINE FINDINGS Alignment:  Normal. Vertebrae: Unchanged subacute compression  fracture of the T12 vertebral body with mild height loss. Focal enhancing, marrow replacing lesions in the right pedicle of T6 (sagittal image 5 series 24), right inferior endplate T8 (sagittal image 6 series 24), left inferior endplate of T5 (sagittal image 11 series 24), and left pedicle of T2 (sagittal image 13 series 24), all suspicious for metastatic disease. No epidural or paraspinal extension. Cord: Normal spinal cord signal and volume. No abnormal enhancement. Paraspinal and other soft tissues: Unchanged chronic scarring in the lung apices. Disc levels: Unchanged small disc bulge and facet arthropathy contributing to mild right neural foraminal narrowing at T9-10. Unchanged disc bulge and facet arthropathy at T10-11 resulting in mild spinal canal stenosis. MRI LUMBAR SPINE FINDINGS Segmentation:  Standard. Alignment:  Normal. Vertebrae: Multiple focal enhancing, marrow replacing lesions in the L1 vertebral body, measuring up to 16 mm (sagittal image 11 series 6). Smaller enhancing, marrow replacing lesions in the superior endplate of L2 (image 8 series 6) and superior endplate of L5 (image 7 series 6), again suspicious for metastatic disease. No epidural or paraspinal extension. Conus medullaris: Extends to the L1 level and appears normal. No abnormal enhancement of the conus or cauda equina nerve roots. Paraspinal and other soft tissues: Unremarkable. Disc levels: T12-L1:  Unremarkable. L1-L2:  Unremarkable. L2-L3: Disc bulge and facet arthropathy results in mild spinal canal stenosis. No significant neural foraminal narrowing. L3-L4: Disc bulge and facet arthropathy results in moderate spinal canal stenosis with compression of  the traversing right L4 nerve root in the right lateral recess. Mild bilateral neural foraminal narrowing. L4-L5: Disc bulge and facet arthropathy results in mild spinal canal stenosis with compression of the traversing right L5 nerve root in the right lateral recess. Moderate left  and mild right neural foraminal narrowing. L5-S1: Disc bulge and facet arthropathy results in compression of the traversing S1 nerve roots in the lateral recesses bilaterally. No significant neural foraminal narrowing. IMPRESSION: 1. Multiple enhancing, marrow replacing lesions in the thoracic and lumbar spine, suspicious for metastatic disease. No epidural or paraspinal extension. 2. Multilevel degenerative changes in the lumbar spine, worst at L3-L4 where there is moderate spinal canal stenosis and compression of the traversing right L4 nerve root in the right lateral recess. 3. At L4-L5, mild spinal canal stenosis with compression of the traversing right L5 nerve root in the right lateral recess. Moderate left neural foraminal narrowing. 4. At L5-S1, compression of the traversing S1 nerve roots in the lateral recesses bilaterally. 5. Unchanged subacute compression fracture of the T12 vertebral body with mild height loss. Electronically Signed   By: Orvan Falconer M.D.   On: 11/29/2022 14:58   MR Thoracic Spine W Wo Contrast  Result Date: 11/29/2022 CLINICAL DATA:  Compression fracture, thoracic, known malignancy compression fracture/ new lesion; compression fracture/ new lesion. EXAM: MRI THORACIC AND LUMBAR SPINE WITHOUT AND WITH CONTRAST TECHNIQUE: Multiplanar and multiecho pulse sequences of the thoracic and lumbar spine were obtained without and with intravenous contrast. CONTRAST:  6mL GADAVIST GADOBUTROL 1 MMOL/ML IV SOLN COMPARISON:  MRI thoracic spine 11/15/2022. MRI lumbar spine 03/08/2006. FINDINGS: MRI THORACIC SPINE FINDINGS Alignment:  Normal. Vertebrae: Unchanged subacute compression fracture of the T12 vertebral body with mild height loss. Focal enhancing, marrow replacing lesions in the right pedicle of T6 (sagittal image 5 series 24), right inferior endplate T8 (sagittal image 6 series 24), left inferior endplate of T5 (sagittal image 11 series 24), and left pedicle of T2 (sagittal image 13  series 24), all suspicious for metastatic disease. No epidural or paraspinal extension. Cord: Normal spinal cord signal and volume. No abnormal enhancement. Paraspinal and other soft tissues: Unchanged chronic scarring in the lung apices. Disc levels: Unchanged small disc bulge and facet arthropathy contributing to mild right neural foraminal narrowing at T9-10. Unchanged disc bulge and facet arthropathy at T10-11 resulting in mild spinal canal stenosis. MRI LUMBAR SPINE FINDINGS Segmentation:  Standard. Alignment:  Normal. Vertebrae: Multiple focal enhancing, marrow replacing lesions in the L1 vertebral body, measuring up to 16 mm (sagittal image 11 series 6). Smaller enhancing, marrow replacing lesions in the superior endplate of L2 (image 8 series 6) and superior endplate of L5 (image 7 series 6), again suspicious for metastatic disease. No epidural or paraspinal extension. Conus medullaris: Extends to the L1 level and appears normal. No abnormal enhancement of the conus or cauda equina nerve roots. Paraspinal and other soft tissues: Unremarkable. Disc levels: T12-L1:  Unremarkable. L1-L2:  Unremarkable. L2-L3: Disc bulge and facet arthropathy results in mild spinal canal stenosis. No significant neural foraminal narrowing. L3-L4: Disc bulge and facet arthropathy results in moderate spinal canal stenosis with compression of the traversing right L4 nerve root in the right lateral recess. Mild bilateral neural foraminal narrowing. L4-L5: Disc bulge and facet arthropathy results in mild spinal canal stenosis with compression of the traversing right L5 nerve root in the right lateral recess. Moderate left and mild right neural foraminal narrowing. L5-S1: Disc bulge and facet arthropathy results in compression of  the traversing S1 nerve roots in the lateral recesses bilaterally. No significant neural foraminal narrowing. IMPRESSION: 1. Multiple enhancing, marrow replacing lesions in the thoracic and lumbar spine,  suspicious for metastatic disease. No epidural or paraspinal extension. 2. Multilevel degenerative changes in the lumbar spine, worst at L3-L4 where there is moderate spinal canal stenosis and compression of the traversing right L4 nerve root in the right lateral recess. 3. At L4-L5, mild spinal canal stenosis with compression of the traversing right L5 nerve root in the right lateral recess. Moderate left neural foraminal narrowing. 4. At L5-S1, compression of the traversing S1 nerve roots in the lateral recesses bilaterally. 5. Unchanged subacute compression fracture of the T12 vertebral body with mild height loss. Electronically Signed   By: Orvan Falconer M.D.   On: 11/29/2022 14:58

## 2022-12-27 ENCOUNTER — Telehealth: Payer: Self-pay

## 2022-12-27 ENCOUNTER — Inpatient Hospital Stay: Payer: Medicare HMO | Admitting: Internal Medicine

## 2022-12-27 ENCOUNTER — Other Ambulatory Visit: Payer: Self-pay

## 2022-12-27 NOTE — Telephone Encounter (Signed)
I called and spoke with Melvin Gilmore's wife, Melvin Gilmore per Dr. Alan Ripper instructions. Patient's wife said he started the new medication Dr. Mervyn Skeeters prescribed the day before, (Movantik), and it seems to be helping. He was finally able to have a bowel movement. His wife says that he is still not having an appetite due to him being in pain and discomfort, but he is drinking the Ensure drinks we recommended. "Today is a better day!", Melvin Gilmore stated before getting off the phone with me.

## 2022-12-31 ENCOUNTER — Ambulatory Visit
Admission: RE | Admit: 2022-12-31 | Discharge: 2022-12-31 | Disposition: A | Discharge status: Home / Self Care | Payer: Medicare HMO | Source: Ambulatory Visit | Attending: Radiation Oncology | Admitting: Radiation Oncology

## 2022-12-31 ENCOUNTER — Encounter: Payer: Self-pay | Admitting: Radiation Oncology

## 2022-12-31 VITALS — BP 131/77 | HR 73 | Temp 97.6°F | Resp 16

## 2022-12-31 DIAGNOSIS — Z510 Encounter for antineoplastic radiation therapy: Secondary | ICD-10-CM | POA: Insufficient documentation

## 2022-12-31 DIAGNOSIS — C9000 Multiple myeloma not having achieved remission: Secondary | ICD-10-CM | POA: Diagnosis present

## 2022-12-31 NOTE — Consult Note (Signed)
NEW PATIENT EVALUATION  Name: Melvin Gilmore  MRN: [id number]  Date:   12/31/2022     DOB: 12-22-1940   This 82 y.o. male patient presents to the clinic for initial evaluation of palliative radiation therapy to L spine for involvement of multiple myeloma.  REFERRING PHYSICIAN: [name], MD  CHIEF COMPLAINT:  Chief Complaint  Patient presents with   multiple myeloma with spinal mets    DIAGNOSIS: The encounter diagnosis was Multiple myeloma not having achieved remission (HCC).   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  He originally presented for lower back pain there was an L1 lesion on MRI scans and lesions in thoracic spine with a subacute compression fracture at T12.  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  He is now referred to ration collagen for consideration of palliative treatment.  His recent PET scan shows activity at T12-L3.  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    PAST SURGICAL HISTORY:  Past Surgical History:  Procedure Laterality Date   APPENDECTOMY     CATARACT EXTRACTION W/PHACO Right 01/15/2016   Procedure: CATARACT EXTRACTION PHACO AND INTRAOCULAR LENS PLACEMENT (IOC);  Surgeon: Sherald Hess, MD;  Location: [facility];  Service: Ophthalmology;  Laterality: Right;  RIGHT CALL CELL PHONE WITH TIME   CHOLECYSTECTOMY     COLONOSCOPY     COLONOSCOPY WITH ESOPHAGOGASTRODUODENOSCOPY (EGD)     ESOPHAGOGASTRODUODENOSCOPY  07/2021   ESOPHAGOGASTRODUODENOSCOPY (EGD) WITH PROPOFOL N/A 09/23/2017   Procedure: ESOPHAGOGASTRODUODENOSCOPY (EGD) WITH PROPOFOL;  Surgeon: Toledo, Boykin Nearing, MD;  Location: [facility];  Service: Gastroenterology;  Laterality: N/A;   HIGH INTENSITY FOCUSED ULTRASOUND (HIFU) OF THE PROSTATE N/A 02/07/2022   Procedure: HIGH INTENSITY FOCUSED ULTRASOUND (HIFU) OF THE PROSTATE;  Surgeon: Orson Ape, MD;  Location: [facility];  Service: Urology;  Laterality: N/A;   HYDROCELE EXCISION / REPAIR     IR KYPHO THORACIC WITH BONE BIOPSY  12/06/2022   IR RADIOLOGIST EVAL & MGMT  11/21/2022   LASIX RT EYE     PROSTATE BIOPSY N/A 01/03/2022   Procedure: PROSTATE BIOPSY  Addison Ruppert;  Surgeon: Orson Ape, MD;  Location: [facility];  Service: Urology;  Laterality: N/A;   ROTATOR CUFF REPAIR Left 2001    FAMILY HISTORY: family history includes Pneumonia in his father.  SOCIAL HISTORY:  reports that he has never smoked. He has never used smokeless tobacco. He reports current alcohol use of about 7.0 standard drinks of alcohol per week. He reports that he does not use drugs.  ALLERGIES: Amoxicillin-pot clavulanate and Meloxicam  MEDICATIONS:  Current Outpatient Medications  Medication Sig Dispense Refill   acetaminophen (TYLENOL) 500 MG tablet Take 500 mg by mouth every 6 (six) hours as needed.     acyclovir (ZOVIRAX) 400 MG tablet Take 400 mg by mouth 2 (two) times daily.     ALPRAZolam (XANAX) 0.5 MG tablet  Take 0.25-0.5 mg by mouth at bedtime as needed for anxiety.     aspirin EC 81 MG tablet Take 1 tablet (81 mg total) by mouth daily. Swallow whole. 90 tablet 2   cetirizine (ZYRTEC) 10 MG tablet Take 10 mg by mouth as needed for allergies.     docusate  sodium (COLACE) 100 MG capsule Take 2 capsules (200 mg total) by mouth 2 (two) times daily. 120 capsule 3   fluticasone (FLONASE) 50 MCG/ACT nasal spray Place 1 spray into both nostrils daily.     lactulose (CHRONULAC) 10 GM/15ML solution Take 10-20 GM (15-30 ml) one - two times a day as needed for constipation 475 mL 0   lenalidomide (REVLIMID) 10 MG capsule Take 1 capsule (10 mg total) by mouth daily. Take for 21 days, then hold for 7 days. Repeat every 28 days. (Patient not taking: Reported on 12/18/2022) 21 capsule 0   levothyroxine (SYNTHROID, LEVOTHROID) 50 MCG tablet Take 50 mcg by mouth daily before breakfast.     naloxegol oxalate (MOVANTIK) 12.5 MG TABS tablet Take 1 tablet (12.5 mg total) by mouth daily for 10 days. 10 tablet 0   omeprazole (PRILOSEC) 40 MG capsule Take 40 mg by mouth every morning.     ondansetron (ZOFRAN) 8 MG tablet Take 1 tablet (8 mg total) by mouth every 8 (eight) hours as needed for nausea or vomiting. 30 tablet 1   oxyCODONE (OXY IR/ROXICODONE) 5 MG immediate release tablet Take 1-2 tablets (5-10 mg total) by mouth every 4 (four) hours as needed for severe pain. 60 tablet 0   Plant Sterols and Stanols (CHOLESTOFF) 450 MG TABS Take 1 tablet by mouth every morning.     predniSONE (DELTASONE) 10 MG tablet Take 1 tablet (10mg ) daily x 1 week, then take 5mg  (1/2 tablet) daily x 1 week, then stop. 15 tablet 0   Probiotic Product (PROBIOTIC ADVANCED PO) Take 1 tablet by mouth at bedtime.     senna (SENOKOT) 8.6 MG TABS tablet Take 2 tablets (17.2 mg total) by mouth daily as needed for mild constipation. 60 tablet 2   tadalafil (CIALIS) 5 MG tablet Take 5 mg by mouth daily as needed for erectile dysfunction. (Patient not taking: Reported on 12/18/2022)     No current facility-administered medications for this encounter.    ECOG PERFORMANCE STATUS:  2 - Symptomatic, <50% confined to bed  REVIEW OF SYSTEMS: Patient has a history of hyperlipidemia hypertension GERD BPH  hypothyroidism prostate cancer status post HIFU Patient denies any weight loss, fatigue, weakness, fever, chills or night sweats. Patient denies any loss of vision, blurred vision. Patient denies any ringing  of the ears or hearing loss. No irregular heartbeat. Patient denies heart murmur or history of fainting. Patient denies any chest pain or pain radiating to her upper extremities. Patient denies any shortness of breath, difficulty breathing at night, cough or hemoptysis. Patient denies any swelling in the lower legs. Patient denies any nausea vomiting, vomiting of blood, or coffee ground material in the vomitus. Patient denies any stomach pain. Patient states has had normal bowel movements no significant constipation or diarrhea. Patient denies any dysuria, hematuria or significant nocturia. Patient denies any problems walking, swelling in the joints or loss of balance. Patient denies any skin changes, loss of hair or loss of weight. Patient denies any excessive worrying or anxiety or significant depression. Patient denies any problems with insomnia. Patient denies excessive thirst, polyuria, polydipsia. Patient denies any swollen glands, patient denies easy  bruising or easy bleeding. Patient denies any recent infections, allergies or URI. Patient "s visual fields have not changed significantly in recent time.   PHYSICAL EXAM: BP 131/77   Pulse 73   Temp 97.6 F (36.4 C)   Resp 16 patient seems to have some guarding or definitive decree strength in his lower extremities proprioception's appears intact.  Deep palpation of his spine does not elicit pain. Well-developed well-nourished patient in NAD. HEENT reveals PERLA, EOMI, discs not visualized.  Oral cavity is clear. No oral mucosal lesions are identified. Neck is clear without evidence of cervical or supraclavicular adenopathy. Lungs are clear to A&P. Cardiac examination is essentially unremarkable with regular rate and rhythm without murmur rub or  thrill. Abdomen is benign with no organomegaly or masses noted. Motor sensory and DTR levels are equal and symmetric in the upper and lower extremities. Cranial nerves II through XII are grossly intact. Proprioception is intact. No peripheral adenopathy or edema is identified. No motor or sensory levels are noted. Crude visual fields are within normal range.  LABORATORY DATA: Pathology report reviewed    RADIOLOGY RESULTS: CT scan and MRI scans reviewed compatible with above-stated findings   IMPRESSION: Multiple myeloma involving the lower thoracic lumbar spine with narcotic dependent pain in 82 year old f male  PLAN: At this time elected ahead with palliative radiation therapy to his T12-L3 vertebral bodies.  Will plan on delivering 25 Gray in 10 fractions.  Risks and benefits of treatment including possible diarrhea fatigue alteration of blood counts skin reaction all were reviewed in detail with the patient.  He comprehends my treatment plan well.  I have set him up for later this week for simulation and will try to start treatment early next week.  I would like to take this opportunity to thank you for allowing me to participate in the care of your patient.Carmina Miller, MD

## 2023-01-01 ENCOUNTER — Other Ambulatory Visit: Payer: Self-pay

## 2023-01-02 ENCOUNTER — Inpatient Hospital Stay: Payer: Medicare HMO

## 2023-01-02 ENCOUNTER — Encounter: Payer: Self-pay | Admitting: Internal Medicine

## 2023-01-02 ENCOUNTER — Ambulatory Visit
Admission: RE | Admit: 2023-01-02 | Discharge: 2023-01-02 | Disposition: A | Discharge status: Home / Self Care | Payer: Medicare HMO | Source: Ambulatory Visit | Attending: Radiation Oncology | Admitting: Radiation Oncology

## 2023-01-02 ENCOUNTER — Inpatient Hospital Stay (HOSPITAL_BASED_OUTPATIENT_CLINIC_OR_DEPARTMENT_OTHER): Payer: Medicare HMO | Admitting: Internal Medicine

## 2023-01-02 ENCOUNTER — Ambulatory Visit: Payer: Medicare HMO

## 2023-01-02 ENCOUNTER — Inpatient Hospital Stay (HOSPITAL_BASED_OUTPATIENT_CLINIC_OR_DEPARTMENT_OTHER): Payer: Medicare HMO | Admitting: Hospice and Palliative Medicine

## 2023-01-02 VITALS — BP 131/59 | HR 64 | Temp 97.7°F | Wt 137.0 lb

## 2023-01-02 DIAGNOSIS — Z510 Encounter for antineoplastic radiation therapy: Secondary | ICD-10-CM | POA: Diagnosis not present

## 2023-01-02 DIAGNOSIS — R630 Anorexia: Secondary | ICD-10-CM

## 2023-01-02 DIAGNOSIS — C9000 Multiple myeloma not having achieved remission: Secondary | ICD-10-CM

## 2023-01-02 DIAGNOSIS — Z515 Encounter for palliative care: Secondary | ICD-10-CM | POA: Diagnosis not present

## 2023-01-02 DIAGNOSIS — K5903 Drug induced constipation: Secondary | ICD-10-CM

## 2023-01-02 DIAGNOSIS — G893 Neoplasm related pain (acute) (chronic): Secondary | ICD-10-CM

## 2023-01-02 DIAGNOSIS — Z5112 Encounter for antineoplastic immunotherapy: Secondary | ICD-10-CM | POA: Diagnosis not present

## 2023-01-02 DIAGNOSIS — S22080A Wedge compression fracture of T11-T12 vertebra, initial encounter for closed fracture: Secondary | ICD-10-CM | POA: Diagnosis not present

## 2023-01-02 DIAGNOSIS — T402X5A Adverse effect of other opioids, initial encounter: Secondary | ICD-10-CM

## 2023-01-02 LAB — CBC WITH DIFFERENTIAL (CANCER CENTER ONLY)
Abs Immature Granulocytes: 0.01 10*3/uL (ref 0.00–0.07)
Basophils Absolute: 0 10*3/uL (ref 0.0–0.1)
Basophils Relative: 0 %
Eosinophils Absolute: 0.1 10*3/uL (ref 0.0–0.5)
Eosinophils Relative: 2 %
HCT: 33 % — ABNORMAL LOW (ref 39.0–52.0)
Hemoglobin: 11.6 g/dL — ABNORMAL LOW (ref 13.0–17.0)
Immature Granulocytes: 0 %
Lymphocytes Relative: 22 %
Lymphs Abs: 1 10*3/uL (ref 0.7–4.0)
MCH: 34.7 pg — ABNORMAL HIGH (ref 26.0–34.0)
MCHC: 35.2 g/dL (ref 30.0–36.0)
MCV: 98.8 fL (ref 80.0–100.0)
Monocytes Absolute: 0.3 10*3/uL (ref 0.1–1.0)
Monocytes Relative: 7 %
Neutro Abs: 3.2 10*3/uL (ref 1.7–7.7)
Neutrophils Relative %: 69 %
Platelet Count: 163 10*3/uL (ref 150–400)
RBC: 3.34 MIL/uL — ABNORMAL LOW (ref 4.22–5.81)
RDW: 14.8 % (ref 11.5–15.5)
WBC Count: 4.6 10*3/uL (ref 4.0–10.5)
nRBC: 0 % (ref 0.0–0.2)

## 2023-01-02 MED ORDER — DIPHENHYDRAMINE HCL 25 MG PO CAPS
50.0000 mg | ORAL_CAPSULE | Freq: Once | ORAL | Status: AC
  Administered 2023-01-02: 50 mg via ORAL
  Filled 2023-01-02: qty 2

## 2023-01-02 MED ORDER — OXYCODONE HCL ER 10 MG PO T12A: 10.0000 mg | EXTENDED_RELEASE_TABLET | Freq: Two times a day (BID) | ORAL | 0 refills | Status: DC

## 2023-01-02 MED ORDER — DEXAMETHASONE 4 MG PO TABS
20.0000 mg | ORAL_TABLET | Freq: Once | ORAL | Status: AC
  Administered 2023-01-02: 20 mg via ORAL
  Filled 2023-01-02: qty 5

## 2023-01-02 MED ORDER — ACETAMINOPHEN 325 MG PO TABS
650.0000 mg | ORAL_TABLET | Freq: Once | ORAL | Status: AC
  Administered 2023-01-02: 650 mg via ORAL
  Filled 2023-01-02: qty 2

## 2023-01-02 MED ORDER — MONTELUKAST SODIUM 10 MG PO TABS
10.0000 mg | ORAL_TABLET | Freq: Once | ORAL | Status: AC
  Administered 2023-01-02: 10 mg via ORAL
  Filled 2023-01-02: qty 1

## 2023-01-02 MED ORDER — NALOXONE HCL 4 MG/0.1ML NA LIQD: NASAL | 0 refills | Status: DC

## 2023-01-02 MED ORDER — DARATUMUMAB-HYALURONIDASE-FIHJ 1800-30000 MG-UT/15ML ~~LOC~~ SOLN
1800.0000 mg | Freq: Once | SUBCUTANEOUS | Status: AC
  Administered 2023-01-02: 1800 mg via SUBCUTANEOUS
  Filled 2023-01-02: qty 15

## 2023-01-02 NOTE — Progress Notes (Signed)
[facility] CONSULT NOTE  Patient Care Team: [name], MD as PCP - General (Internal Medicine)   CANCER STAGING   Cancer Staging  Multiple myeloma [facility]) Staging form: Plasma Cell Myeloma and Plasma Cell Disorders, AJCC 8th Edition - Clinical stage from 12/05/2022: RISS Stage II (Beta-2-microglobulin (mg/L): 3.7, Albumin (g/dL): 3.6, ISS: Stage II, High-risk cytogenetics: Absent, LDH: Normal) - Signed by [name], MD on 12/26/2022 Stage prefix: Initial diagnosis Beta 2 microglobulin range (mg/L): 3.5 to 5.49 Albumin range (g/dL): Greater than or equal to 3.5 Cytogenetics: 1q addition, Other mutation   ASSESSMENT & PLAN:  Melvin Gilmore 82 y.o. male with pmh of hypertension, hyperlipidemia, anxiety, GERD, BPH, hypothyroidism, prostate cancer s/p HIFU was seen by primary on November 13, 2022 for acute onset of lower back pain.  Was referred to medical oncology for finding of L1 lesion suspicious for metastasis.  # Multiple myeloma, RISS Stage II -MRI thoracic and lumbar spine with and without contrast was reviewed. Showed unchanged subacute compression fracture at T12.  Focal enhancing marrow replacing lesions in T6, T8, T5, T2 all suspicious for metastatic disease.  L1 lesion measuring 16 mm.  Multilevel degenerative changes present.  -SPEP/IFE showed biclonal IgA kappa paraprotein with M spike 2.1 g/dL and second spike at 0.4 g/dL.  Kappa 453, lambda 6.8 with a ratio of 66.7.  Iron panel and B12 are normal.  PSA 3.44.  CBC showed mild anemia 13.4, chronic.  On CMP normal creatinine and calcium levels.  Elevated total protein 8.8.  LDH normal.  Beta-2 microglobulin 3.7.  Albumin 3.6.  24-hour urine/UPEP showed 1.1 g protein with no M spike.  -BMBx Hypercellular marrow with 60% plasma cells.  IHC positive for CD138/ mum 1.  Kappa restricted consistent with multiple myeloma. L1 vertebral body biopsy showed numerous plasma cells. Cytogenetics - del (13q) and gain 1.     -PET/CT showed activity in L1 and left eighth rib concerning for myeloma.  -Started on Dara-RD on 12/26/2022.  Tolerating well.  Continue with Revlimid 10 mg once daily 3 weeks on 1 week off.  Continue with weekly dexamethasone 20 mg.  CBC reviewed today and okay to proceed with the treatment.  # T12 pathological fracture # Cancer-related pain - s/p kyphoplasty by Dr. Elijio Miles on 12/06/22.  Initially had improvement but now pain is worse. -Vicodin did not work.  On oxycodone 5 mg 1 to 2 pills every 4 hours as needed.  Due to constipation he has cut down on the use of oxycodone.  Does not feel like any pain medication is working for him. -Was seen by Dr. Aggie Cosier.  CT simulation today.  Plan to start palliative RT to spine next week.  # Opioid-induced constipation -Manageable with senna, MiraLAX, Movantik as needed.  # History of prostate cancer - follows with Dr. Evelene Croon, urology for elevated PSA.  MRI prostate showed 1 mm category 5 lesion of anterior transition zone.  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 s/p HIFU of the prostate on 02/07/2022.  PSA level from 10/08/2022 was 3.34.  -PSA from 11/21/2022 3.44.  # Normocytic anemia -Chronic, hemoglobin 13.4.   -Iron panel, B12 and folate normal.  # Poor appetite # Weight loss -Could be from cancer and uncontrolled pain.  Pain management as above. -Scheduled to see nutrition next week.   Orders Placed This Encounter  Procedures   CBC with Differential (Cancer Center Only)    Standing Status:  Future    Standing Expiration Date:   01/16/2024   Multiple Myeloma Panel (SPEP&IFE w/QIG)    Standing Status:   Future    Standing Expiration Date:   01/02/2024   Kappa/lambda light chains    Standing Status:   Future    Standing Expiration Date:   01/02/2024   RTC in 1 week for MD visit, labs, cycle 1 day 15 daratumumab.  The total time spent in the appointment was 30 minutes encounter  with patients including review of chart and various tests results, discussions about plan of care and coordination of care plan   All questions were answered. The patient knows to call the clinic with any problems, questions or concerns. No barriers to learning was detected.  [name], MD 6/13/202411:39 AM   HISTORY OF PRESENTING ILLNESS:  Melvin Gilmore 82 y.o. male with past medical history of hypertension, hyperlipidemia, anxiety, GERD, BPH, hypothyroidism, prostate cancer s/p HIFU was seen by primary on November 13, 2022 for acute onset of lower back pain.  Patient reports that he was sitting outside and had violent sneeze which was followed by severe back pain.  He has fallen allergies.  Had x-rays followed by MRI thoracic spine without contrast.  It showed compression fracture of T12 superior endplate with surrounding marrow edema likely subacute.  15 mm T1 hypointense marrow lesion in the anterior aspect of L1 suspicious for metastasis.  Further characterization with postcontrast MRI recommended.  Prostate cancer-follows with Dr. Evelene Croon, urology for elevated PSA.  MRI prostate showed 1 mm category 5 lesion of anterior transition zone.  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 s/p HIFU of the prostate on 02/07/2022.  PSA level from 10/08/2022 was 3.34.  Interval history Patient seen today accompanied with his wife to prior to cycle 1 day 8 of daratumumab.  Patient continues to have problem with his back pain.  He has cut down on his oxycodone because constipation is too bothersome for him.  He was started on Movantik last week which helped.  Does not think any pain medication is working and is not much keen on taking more pain meds.  He was seen by Dr. Rushie Chestnut and is planned for CT simulation today.  Plans to start palliative RT next week and hoping the pain will respond.  Is not able to sleep at night because of pain not able to get  into right position.  Appetite is also poor.  Is taking Ensure once a day.  Does not want to drink more and does not like the taste.  Scheduled to see nutrition next week.  I have reviewed his chart and materials related to his cancer extensively and collaborated history with the patient. Summary of oncologic history is as follows: Oncology History  Multiple myeloma (HCC)  12/05/2022 Cancer Staging   Staging form: Plasma Cell Myeloma and Plasma Cell Disorders, AJCC 8th Edition - Clinical stage from 12/05/2022: RISS Stage II (Beta-2-microglobulin (mg/L): 3.7, Albumin (g/dL): 3.6, ISS: Stage II, High-risk cytogenetics: Absent, LDH: Normal) - Signed by [name], MD on 12/26/2022 Stage prefix: Initial diagnosis Beta 2 microglobulin range (mg/L): 3.5 to 5.49 Albumin range (g/dL): Greater than or equal to 3.5 Cytogenetics: 1q addition, Other mutation   12/13/2022 Initial Diagnosis   Multiple myeloma (HCC)   12/26/2022 -  Chemotherapy   Patient is on Treatment Plan : MYELOMA NEWLY DIAGNOSED Daratumumab IV + Lenalidomide + Dexamethasone Weekly (DaraRd) q28d  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   Prostate cancer (HCC) 2023   Seasonal allergies    Skin cancer    a.) ears   Wears partial dentures    lower    SURGICAL HISTORY: Past Surgical History:  Procedure Laterality Date   APPENDECTOMY     CATARACT EXTRACTION W/PHACO Right 01/15/2016   Procedure: CATARACT EXTRACTION PHACO AND INTRAOCULAR LENS PLACEMENT (IOC);  Surgeon:  Sherald Hess, MD;  Location: [facility];  Service: Ophthalmology;  Laterality: Right;  RIGHT CALL CELL PHONE WITH TIME   CHOLECYSTECTOMY     COLONOSCOPY     COLONOSCOPY WITH ESOPHAGOGASTRODUODENOSCOPY (EGD)     ESOPHAGOGASTRODUODENOSCOPY  07/2021   ESOPHAGOGASTRODUODENOSCOPY (EGD) WITH PROPOFOL N/A 09/23/2017   Procedure: ESOPHAGOGASTRODUODENOSCOPY (EGD) WITH PROPOFOL;  Surgeon: Toledo, Boykin Nearing, MD;  Location: [facility];  Service: Gastroenterology;  Laterality: N/A;   HIGH INTENSITY FOCUSED ULTRASOUND (HIFU) OF THE PROSTATE N/A 02/07/2022   Procedure: HIGH INTENSITY FOCUSED ULTRASOUND (HIFU) OF THE PROSTATE;  Surgeon: Orson Ape, MD;  Location: [facility];  Service: Urology;  Laterality: N/A;   HYDROCELE EXCISION / REPAIR     IR KYPHO THORACIC WITH BONE BIOPSY  12/06/2022   IR RADIOLOGIST EVAL & MGMT  11/21/2022   LASIX RT EYE     PROSTATE BIOPSY N/A 01/03/2022   Procedure: PROSTATE BIOPSY  Addison Oates;  Surgeon: Orson Ape, MD;  Location: [facility];  Service: Urology;  Laterality: N/A;   ROTATOR CUFF REPAIR Left 2001    SOCIAL HISTORY: Social History   Socioeconomic History   Marital status: Married    Spouse name: Not on file   Number of children: Not on file   Years of education: Not on file   Highest education level: Not on file  Occupational History   Not on file  Tobacco Use   Smoking status: Never   Smokeless tobacco: Never  Vaping Use   Vaping Use: Never used  Substance and Sexual Activity   Alcohol use: Yes    Alcohol/week: 7.0 standard drinks of alcohol    Types: 7 Glasses of wine per week    Comment: wine occ   Drug use: No   Sexual activity: Not on file  Other Topics Concern   Not on file  Social History Narrative   Not on file   Social Determinants of Health   Financial Resource Strain: Low Risk  (11/21/2022)   Overall Financial Resource Strain (CARDIA)    Difficulty of Paying Living Expenses: Not hard at all  Food Insecurity:  No Food Insecurity (11/21/2022)   Hunger Vital Sign    Worried About Running Out of Food in the Last Year: Never true    Ran Out of Food in the Last Year: Never true  Transportation Needs: Not on file  Physical Activity: Not on file  Stress: Not on file  Social Connections: Not on file  Intimate Partner Violence:  Not At Risk (11/21/2022)   Humiliation, Afraid, Rape, and Kick questionnaire    Fear of Current or Ex-Partner: No    Emotionally Abused: No    Physically Abused: No    Sexually Abused: No    FAMILY HISTORY: Family History  Problem Relation Age of Onset   Pneumonia Father     ALLERGIES:  is allergic to amoxicillin-pot clavulanate and meloxicam.  MEDICATIONS:  Current Outpatient Medications  Medication Sig Dispense Refill   acetaminophen (TYLENOL) 500 MG tablet Take 500 mg by mouth every 6 (six) hours as needed.     acyclovir (ZOVIRAX) 400 MG tablet Take 400 mg by mouth 2 (two) times daily.     ALPRAZolam (XANAX) 0.5 MG tablet Take 0.25-0.5 mg by mouth at bedtime as needed for anxiety.     aspirin EC 81 MG tablet Take 1 tablet (81 mg total) by mouth daily. Swallow whole. 90 tablet 2   cetirizine (ZYRTEC) 10 MG tablet Take 10 mg by mouth as needed for allergies.     docusate sodium (COLACE) 100 MG capsule Take 2 capsules (200 mg total) by mouth 2 (two) times daily. 120 capsule 3   fluticasone (FLONASE) 50 MCG/ACT nasal spray Place 1 spray into both nostrils daily.     lactulose (CHRONULAC) 10 GM/15ML solution Take 10-20 GM (15-30 ml) one - two times a day as needed for constipation 475 mL 0   levothyroxine (SYNTHROID, LEVOTHROID) 50 MCG tablet Take 50 mcg by mouth daily before breakfast.     naloxegol oxalate (MOVANTIK) 12.5 MG TABS tablet Take 1 tablet (12.5 mg total) by mouth daily for 10 days. 10 tablet 0   omeprazole (PRILOSEC) 40 MG capsule Take 40 mg by mouth every morning.     ondansetron (ZOFRAN) 8 MG tablet Take 1 tablet (8 mg total) by mouth every 8 (eight) hours as  needed for nausea or vomiting. 30 tablet 1   oxyCODONE (OXY IR/ROXICODONE) 5 MG immediate release tablet Take 1-2 tablets (5-10 mg total) by mouth every 4 (four) hours as needed for severe pain. 60 tablet 0   Plant Sterols and Stanols (CHOLESTOFF) 450 MG TABS Take 1 tablet by mouth every morning.     predniSONE (DELTASONE) 10 MG tablet Take 1 tablet (10mg ) daily x 1 week, then take 5mg  (1/2 tablet) daily x 1 week, then stop. 15 tablet 0   Probiotic Product (PROBIOTIC ADVANCED PO) Take 1 tablet by mouth at bedtime.     senna (SENOKOT) 8.6 MG TABS tablet Take 2 tablets (17.2 mg total) by mouth daily as needed for mild constipation. 60 tablet 2   lenalidomide (REVLIMID) 10 MG capsule Take 1 capsule (10 mg total) by mouth daily. Take for 21 days, then hold for 7 days. Repeat every 28 days. (Patient not taking: Reported on 12/18/2022) 21 capsule 0   tadalafil (CIALIS) 5 MG tablet Take 5 mg by mouth daily as needed for erectile dysfunction. (Patient not taking: Reported on 12/18/2022)     No current facility-administered medications for this visit.   Facility-Administered Medications Ordered in Other Visits  Medication Dose Route Frequency Provider Last Rate Last Admin   daratumumab-hyaluronidase-fihj (DARZALEX FASPRO) 1800-30000 MG-UT/15ML chemo SQ injection 1,800 mg  1,800 mg Subcutaneous Once [name], MD        REVIEW OF SYSTEMS:   Pertinent information mentioned in HPI All other systems were reviewed with the patient and are negative.  PHYSICAL EXAMINATION: ECOG PERFORMANCE STATUS: 1 - Symptomatic but completely ambulatory  Vitals:   01/02/23 0925  BP: (!) 131/59  Pulse: 64  Temp: 97.7 F (36.5 C)  SpO2: 100%    Filed Weights   01/02/23 0925  Weight: 137 lb (62.1 kg)     GENERAL:alert, no distress and comfortable SKIN: skin color, texture, turgor are normal, no rashes or significant lesions EYES: normal, conjunctiva are pink and non-injected, sclera clear OROPHARYNX:no  exudate, no erythema and lips, buccal mucosa, and tongue normal  NECK: supple, thyroid normal size, non-tender, without nodularity LYMPH:  no palpable lymphadenopathy in the cervical, axillary or inguinal LUNGS: clear to auscultation and percussion with normal breathing effort HEART: regular rate & rhythm and no murmurs and no lower extremity edema ABDOMEN:abdomen soft, non-tender and normal bowel sounds Musculoskeletal:no cyanosis of digits and no clubbing  PSYCH: alert & oriented x 3 with fluent speech NEURO: no focal motor/sensory deficits  LABORATORY DATA:  I have reviewed the data as listed Lab Results  Component Value Date   WBC 4.6 01/02/2023   HGB 11.6 (L) 01/02/2023   HCT 33.0 (L) 01/02/2023   MCV 98.8 01/02/2023   PLT 163 01/02/2023   Recent Labs    02/07/22 0631 12/13/22 1454  NA 133* 132*  K 3.7 4.8  CL 100 93*  CO2 26 27  GLUCOSE 93 104*  BUN 13 20  CREATININE 1.05 1.04  CALCIUM 9.5 9.5  GFRNONAA >60 >60  PROT  --  9.2*  ALBUMIN  --  3.6  AST  --  23  ALT  --  17  ALKPHOS  --  73  BILITOT  --  0.5    RADIOGRAPHIC STUDIES: I have personally reviewed the radiological images as listed and agreed with the findings in the report. NM PET Image Initial (PI) Whole Body  Result Date: 12/25/2022 CLINICAL DATA:  Initial treatment strategy for multiple myeloma. EXAM: NUCLEAR MEDICINE PET WHOLE BODY TECHNIQUE: 8.6 mCi F-18 FDG was injected intravenously. Full-ring PET imaging was performed from the head to foot after the radiotracer. CT data was obtained and used for attenuation correction and anatomic localization. Fasting blood glucose: 102 mg/dl COMPARISON:  MR lumbar spine 11/28/2022 FINDINGS: Mediastinal blood pool activity: SUV max 2.1 HEAD/NECK: No hypermetabolic activity in the scalp. No hypermetabolic cervical lymph nodes. Incidental CT findings: none CHEST: No hypermetabolic mediastinal or hilar nodes. No suspicious pulmonary nodules on the CT scan. Incidental CT  findings: Chronic interstitial thickening in the lungs. ABDOMEN/PELVIS: No abnormal hypermetabolic activity within the liver, pancreas, adrenal glands, or spleen. No hypermetabolic lymph nodes in the abdomen or pelvis. Incidental CT findings: none SKELETON: Focal radiotracer activity within the L1 vertebral body with SUV max equal 5.1 (image 209). This activity is 1 vertebral body level lower than the vertebroplasty for compression fracture at T12. Mild radiotracer activity associated with the superior endplate of the L 3 vertebral body with SUV max equal 3.8. Metabolic activity associated the posterior LEFT eighth rib with SUV max equal 3.9 on image 77. No clear CT changes. Focal uptake in the medial aspect of the RIGHT eighth rib at the costovertebral junction. Favor degenerative uptake. Incidental CT findings: No sclerotic or lytic lesions on CT imaging. EXTREMITIES: No abnormal hypermetabolic activity in the lower extremities. Incidental CT findings: none IMPRESSION: 1. Focal uptake in the L1 vertebral body concern for multiple myeloma however a traumatic compression fractures also in the differential. Augmentation of the vertebral body one level above. 2. Uptake in the posterior LEFT eighth rib is concerning for myeloma. 3.  Two smaller lesions are indeterminate for degenerative change versus myeloma. 4. No on typical lytic lesions on the CT portion exam. Electronically Signed   By: Genevive Bi M.D.   On: 12/25/2022 13:20   IR KYPHO THORACIC WITH BONE BIOPSY  Addendum Date: 12/17/2022   ADDENDUM REPORT: 12/17/2022 16:11 ADDENDUM: The operated vertebral body was T12, as amended in portions of the report as detailed below. PROCEDURE: The patient was positioned prone on the exam table. A unipedicular access was planned. Skin entry site was marked overlying the T12 vertebral body using fluoroscopy. IMPRESSION: 1. Successful T12 vertebral body augmentation using balloon kyphoplasty. Electronically Signed    By: Olive Bass M.D.   On: 12/17/2022 16:11   Result Date: 12/17/2022 INDICATION: T12 compression fracture EXAM: T12 vertebral body augmentation using balloon kyphoplasty COMPARISON:  None Available. MEDICATIONS: Documented in the EMR ANESTHESIA/SEDATION: Moderate (conscious) sedation was employed during this procedure. A total of Versed 2.5 mg and Fentanyl 75 mcg was administered intravenously by the radiology nurse. Total intra-service moderate Sedation Time: 35 minutes. The patient's level of consciousness and vital signs were monitored continuously by radiology nursing throughout the procedure under my direct supervision. FLUOROSCOPY: Radiation Exposure Index (as provided by the fluoroscopic device): 5.6 minutes (44 mGy) COMPLICATIONS: None immediate. PROCEDURE: Informed written consent was obtained from the patient after a thorough discussion of the procedural risks, benefits and alternatives. All questions were addressed. Maximal Sterile Barrier Technique was utilized including caps, mask, sterile gowns, sterile gloves, sterile drape, hand hygiene and skin antiseptic. A timeout was performed prior to the initiation of the procedure. The patient was positioned prone on the exam table. A unipedicular access was planned. Skin entry site was marked overlying the L1 vertebral body using fluoroscopy. The overlying skin was then prepped and draped in the standard sterile fashion. Local analgesia was obtained with 1% lidocaine. Attention was turned to the right side. Under fluoroscopic guidance, a 10 gauge introducer needle was advanced towards the lateral margin of the pedicle. Using multiple projections, the introducer needle was advanced towards the posterior margin of the vertebral body via a transpedicular approach. The inner needle was then removed, and the drill was advanced towards the anterior margin of the vertebral body. A Kyphon 15 mm inflatable bone tamp was then advanced through the transpedicular  access needle and positioned within the mid vertebral body. Kyphoplasty was then performed, ensuring that the balloon contours stayed within the vertebral body margins. The balloon was then deflated and removed, followed by advancement of the bone filler device and the instillation of acrylic bone cement with excellent filling in the AP and lateral projections. No extravasation was noted in the disk spaces or posteriorly into the spinal canal. No epidural venous contamination was seen. At the end of the procedure, the introducer cannula and bone filler device were removed without difficulty. A clean dressing was placed after hemostasis. The patient tolerated all aspects of the procedure well, and was transferred to recovery in stable condition. IMPRESSION: 1. Successful L1 vertebral body augmentation using balloon kyphoplasty. If the patient has known osteoporosis, recommend treatment as clinically indicated. If the patient's bone density status is unknown, DEXA scan is recommended. Electronically Signed: By: Olive Bass M.D. On: 12/06/2022 10:48   DG HIP UNILAT WITH PELVIS 2-3 VIEWS RIGHT  Result Date: 12/14/2022 CLINICAL DATA:  Pain. EXAM: DG HIP (WITH OR WITHOUT PELVIS) 3V RIGHT COMPARISON:  None Available. FINDINGS: Bilateral hip osteophytes and joint space narrowing. Pelvic ring intact. No fracture,  dislocation or subluxation. IMPRESSION: Bilateral hip degenerative changes. Electronically Signed   By: Layla Maw M.D.   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 CT-guided bone marrow aspiration and core biopsy 2. CT-guided core needle biopsy of focal bone lesion of the L1 vertebral body MEDICATIONS: None. ANESTHESIA/SEDATION: Moderate (conscious) sedation was employed during this procedure. A total of Versed 2 mg and Fentanyl 100 mcg was administered intravenously.  Moderate Sedation Time: 55 minutes. The patient's level of consciousness and vital signs were monitored continuously by radiology nursing throughout the procedure under my direct supervision. FLUOROSCOPY TIME:  N/a COMPLICATIONS: None immediate. PROCEDURE: Informed written consent was obtained from the patient after a thorough discussion of the procedural risks, benefits and alternatives. All questions were addressed. Maximal Sterile Barrier Technique was utilized including caps, mask, sterile gowns, sterile gloves, sterile drape, hand hygiene and skin antiseptic. A timeout was performed prior to the initiation of the procedure. Bone marrow aspiration and core biopsy: The patient was placed prone on the CT exam table. Limited CT of the pelvis was performed for planning purposes. Skin entry site was marked, and the overlying skin was prepped and draped in the standard sterile fashion. Local analgesia was obtained with 1% lidocaine. Using CT guidance, an 11 gauge needle was advanced just deep to the cortex of the right posterior ilium. Subsequently, bone marrow aspiration and core biopsy were performed. Specimens were submitted to lab/pathology for handling. Hemostasis was achieved with manual pressure, and a clean dressing was placed. The patient tolerated the procedure well without immediate complication. L1 vertebral body lesion core biopsy: The patient was placed prone on the exam table. Limited CT of the lumbar spine was performed for planning purposes. This demonstrated no discernible bone lesion, and therefore biopsy route was planned based on anatomic landmarks from corresponding MRI lumbar spine. Skin entry site was marked, and the overlying skin was prepped and draped in the standard sterile fashion. Local analgesia was obtained with 1% lidocaine. Using intermittent CT fluoroscopy, an 11 gauge bone biopsy needle was advanced towards the expected location of the L1 vertebral body lesion using anatomic  landmarks. Subsequently, a single 11 gauge core was obtained. Specimens were submitted in formalin to pathology for further handling. Limited postprocedure imaging demonstrated no complicating feature. The patient tolerated the procedure well, and was transferred to recovery in stable condition. IMPRESSION: 1. Successful bone marrow aspiration and core biopsy of the right posterior ilium. 2. Successful CT-guided core needle biopsy of focal lesion of the L1 vertebral body. Electronically Signed   By: Olive Bass M.D.   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 CT-guided bone marrow aspiration and core biopsy 2. CT-guided core needle biopsy of focal bone lesion of the L1 vertebral body MEDICATIONS: None. ANESTHESIA/SEDATION: Moderate (conscious) sedation was employed during this procedure. A total of Versed 2 mg and Fentanyl 100 mcg was administered intravenously. Moderate Sedation Time: 55 minutes. The patient's level of consciousness and vital signs were monitored continuously by radiology nursing throughout the procedure under my direct supervision. FLUOROSCOPY TIME:  N/a COMPLICATIONS: None immediate. PROCEDURE: Informed written consent was obtained from the patient after a thorough discussion of the procedural risks, benefits and alternatives. All questions were addressed. Maximal Sterile Barrier Technique was utilized including caps, mask,  sterile gowns, sterile gloves, sterile drape, hand hygiene and skin antiseptic. A timeout was performed prior to the initiation of the procedure. Bone marrow aspiration and core biopsy: The patient was placed prone on the CT exam table. Limited CT of the pelvis was performed for planning purposes. Skin entry site was marked, and the overlying skin was prepped and draped in the standard sterile fashion. Local analgesia was obtained with  1% lidocaine. Using CT guidance, an 11 gauge needle was advanced just deep to the cortex of the right posterior ilium. Subsequently, bone marrow aspiration and core biopsy were performed. Specimens were submitted to lab/pathology for handling. Hemostasis was achieved with manual pressure, and a clean dressing was placed. The patient tolerated the procedure well without immediate complication. L1 vertebral body lesion core biopsy: The patient was placed prone on the exam table. Limited CT of the lumbar spine was performed for planning purposes. This demonstrated no discernible bone lesion, and therefore biopsy route was planned based on anatomic landmarks from corresponding MRI lumbar spine. Skin entry site was marked, and the overlying skin was prepped and draped in the standard sterile fashion. Local analgesia was obtained with 1% lidocaine. Using intermittent CT fluoroscopy, an 11 gauge bone biopsy needle was advanced towards the expected location of the L1 vertebral body lesion using anatomic landmarks. Subsequently, a single 11 gauge core was obtained. Specimens were submitted in formalin to pathology for further handling. Limited postprocedure imaging demonstrated no complicating feature. The patient tolerated the procedure well, and was transferred to recovery in stable condition. IMPRESSION: 1. Successful bone marrow aspiration and core biopsy of the right posterior ilium. 2. Successful CT-guided core needle biopsy of focal lesion of the L1 vertebral body. Electronically Signed   By: Olive Bass M.D.   On: 12/05/2022 13:14

## 2023-01-02 NOTE — Progress Notes (Signed)
Patient here for 2nd Darzalex-Faspro. Pre medds given per protocol. Injection given and patient monitored for 1 hour post injection. Tolerated well.

## 2023-01-02 NOTE — Addendum Note (Signed)
Addended byMichaelyn Barter on: 01/02/2023 11:43 AM   Modules accepted: Orders

## 2023-01-02 NOTE — Progress Notes (Signed)
Palliative Medicine [facility] [phone] [phone]   Name: Melvin Gilmore Date: 01/02/2023 MRN: [id number]  DOB: 1941/01/26  Patient Care Team: [name], MD as PCP - General (Internal Medicine)    REASON FOR CONSULTATION: Melvin Gilmore is a 82 y.o. male with multiple medical problems including history of prostate cancer status post HIFU, now with multiple myeloma.  MRI thoracic and lumbar spine showed a subacute compression fracture of T12 and focal enhancing marrow replacing lesions in T2, T5, T6, T8, and L1.  Hospice consulted to address goals and manage ongoing symptoms.  SOCIAL HISTORY:     reports that he has never smoked. He has never used smokeless tobacco. He reports current alcohol use of about 7.0 standard drinks of alcohol per week. He reports that he does not use drugs.  Patient is married lives home with his wife.  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   Prostate cancer (HCC) 2023   Seasonal allergies    Skin cancer    a.) ears   Wears partial dentures    lower    PAST SURGICAL HISTORY:  Past Surgical History:  Procedure Laterality Date   APPENDECTOMY     CATARACT EXTRACTION W/PHACO Right 01/15/2016   Procedure: CATARACT EXTRACTION PHACO AND INTRAOCULAR LENS PLACEMENT (IOC);  Surgeon: Sherald Hess, MD;  Location: [facility];  Service: Ophthalmology;  Laterality:  Right;  RIGHT CALL CELL PHONE WITH TIME   CHOLECYSTECTOMY     COLONOSCOPY     COLONOSCOPY WITH ESOPHAGOGASTRODUODENOSCOPY (EGD)     ESOPHAGOGASTRODUODENOSCOPY  07/2021   ESOPHAGOGASTRODUODENOSCOPY (EGD) WITH PROPOFOL N/A 09/23/2017   Procedure: ESOPHAGOGASTRODUODENOSCOPY (EGD) WITH PROPOFOL;  Surgeon: Toledo, Boykin Nearing, MD;  Location: [facility];  Service: Gastroenterology;  Laterality: N/A;   HIGH INTENSITY FOCUSED ULTRASOUND (HIFU) OF THE PROSTATE N/A 02/07/2022   Procedure: HIGH INTENSITY FOCUSED ULTRASOUND (HIFU) OF THE PROSTATE;  Surgeon: Orson Ape, MD;  Location: [facility];  Service: Urology;  Laterality: N/A;   HYDROCELE EXCISION / REPAIR     IR KYPHO THORACIC WITH BONE BIOPSY  12/06/2022   IR RADIOLOGIST EVAL & MGMT  11/21/2022   LASIX RT EYE     PROSTATE BIOPSY N/A 01/03/2022   Procedure: PROSTATE BIOPSY  Addison Obenchain;  Surgeon: Orson Ape, MD;  Location: [facility];  Service: Urology;  Laterality: N/A;   ROTATOR CUFF REPAIR Left 2001    HEMATOLOGY/ONCOLOGY HISTORY:  Oncology History  Multiple myeloma (HCC)  12/05/2022 Cancer Staging   Staging form: Plasma Cell Myeloma and Plasma Cell Disorders, AJCC 8th Edition - Clinical stage from 12/05/2022: RISS Stage II (Beta-2-microglobulin (mg/L): 3.7, Albumin (g/dL): 3.6, ISS: Stage II, High-risk cytogenetics: Absent, LDH: Normal) - Signed by [name], MD on 12/26/2022 Stage prefix: Initial diagnosis Beta 2 microglobulin range (mg/L): 3.5 to 5.49 Albumin range (  g/dL): Greater than or equal to 3.5 Cytogenetics: 1q addition, Other mutation   12/13/2022 Initial Diagnosis   Multiple myeloma (HCC)   12/26/2022 -  Chemotherapy   Patient is on Treatment Plan : MYELOMA NEWLY DIAGNOSED Daratumumab IV + Lenalidomide + Dexamethasone Weekly (DaraRd) q28d       ALLERGIES:  is allergic to amoxicillin-pot clavulanate and meloxicam.  MEDICATIONS:  Current Outpatient Medications  Medication Sig Dispense Refill   oxyCODONE (OXYCONTIN) 10  mg 12 hr tablet Take 1 tablet (10 mg total) by mouth every 12 (twelve) hours. 30 tablet 0   acetaminophen (TYLENOL) 500 MG tablet Take 500 mg by mouth every 6 (six) hours as needed.     acyclovir (ZOVIRAX) 400 MG tablet Take 400 mg by mouth 2 (two) times daily.     ALPRAZolam (XANAX) 0.5 MG tablet Take 0.25-0.5 mg by mouth at bedtime as needed for anxiety.     aspirin EC 81 MG tablet Take 1 tablet (81 mg total) by mouth daily. Swallow whole. 90 tablet 2   cetirizine (ZYRTEC) 10 MG tablet Take 10 mg by mouth as needed for allergies.     docusate sodium (COLACE) 100 MG capsule Take 2 capsules (200 mg total) by mouth 2 (two) times daily. 120 capsule 3   fluticasone (FLONASE) 50 MCG/ACT nasal spray Place 1 spray into both nostrils daily.     lactulose (CHRONULAC) 10 GM/15ML solution Take 10-20 GM (15-30 ml) one - two times a day as needed for constipation 475 mL 0   lenalidomide (REVLIMID) 10 MG capsule Take 1 capsule (10 mg total) by mouth daily. Take for 21 days, then hold for 7 days. Repeat every 28 days. (Patient not taking: Reported on 12/18/2022) 21 capsule 0   levothyroxine (SYNTHROID, LEVOTHROID) 50 MCG tablet Take 50 mcg by mouth daily before breakfast.     naloxegol oxalate (MOVANTIK) 12.5 MG TABS tablet Take 1 tablet (12.5 mg total) by mouth daily for 10 days. 10 tablet 0   omeprazole (PRILOSEC) 40 MG capsule Take 40 mg by mouth every morning.     ondansetron (ZOFRAN) 8 MG tablet Take 1 tablet (8 mg total) by mouth every 8 (eight) hours as needed for nausea or vomiting. 30 tablet 1   oxyCODONE (OXY IR/ROXICODONE) 5 MG immediate release tablet Take 1-2 tablets (5-10 mg total) by mouth every 4 (four) hours as needed for severe pain. 60 tablet 0   Plant Sterols and Stanols (CHOLESTOFF) 450 MG TABS Take 1 tablet by mouth every morning.     predniSONE (DELTASONE) 10 MG tablet Take 1 tablet (10mg ) daily x 1 week, then take 5mg  (1/2 tablet) daily x 1 week, then stop. 15 tablet 0   Probiotic Product  (PROBIOTIC ADVANCED PO) Take 1 tablet by mouth at bedtime.     senna (SENOKOT) 8.6 MG TABS tablet Take 2 tablets (17.2 mg total) by mouth daily as needed for mild constipation. 60 tablet 2   tadalafil (CIALIS) 5 MG tablet Take 5 mg by mouth daily as needed for erectile dysfunction. (Patient not taking: Reported on 12/18/2022)     No current facility-administered medications for this visit.    VITAL SIGNS: There were no vitals taken for this visit. There were no vitals filed for this visit.  Estimated body mass index is 19.11 kg/m as calculated from the following:   Height as of 12/05/22: 5\' 11"  (1.803 m).   Weight as of an earlier encounter on 01/02/23: 137 lb (62.1 kg).  LABS:  CBC:    Component Value Date/Time   WBC 4.6 01/02/2023 0912   WBC 4.7 12/05/2022 0746   HGB 11.6 (L) 01/02/2023 0912   HCT 33.0 (L) 01/02/2023 0912   PLT 163 01/02/2023 0912   MCV 98.8 01/02/2023 0912   NEUTROABS 3.2 01/02/2023 0912   LYMPHSABS 1.0 01/02/2023 0912   MONOABS 0.3 01/02/2023 0912   EOSABS 0.1 01/02/2023 0912   BASOSABS 0.0 01/02/2023 0912   Comprehensive Metabolic Panel:    Component Value Date/Time   NA 132 (L) 12/13/2022 1454   K 4.8 12/13/2022 1454   CL 93 (L) 12/13/2022 1454   CO2 27 12/13/2022 1454   BUN 20 12/13/2022 1454   CREATININE 1.04 12/13/2022 1454   GLUCOSE 104 (H) 12/13/2022 1454   CALCIUM 9.5 12/13/2022 1454   AST 23 12/13/2022 1454   ALT 17 12/13/2022 1454   ALKPHOS 73 12/13/2022 1454   BILITOT 0.5 12/13/2022 1454   PROT 9.2 (H) 12/13/2022 1454   ALBUMIN 3.6 12/13/2022 1454    RADIOGRAPHIC STUDIES: NM PET Image Initial (PI) Whole Body  Result Date: 12/25/2022 CLINICAL DATA:  Initial treatment strategy for multiple myeloma. EXAM: NUCLEAR MEDICINE PET WHOLE BODY TECHNIQUE: 8.6 mCi F-18 FDG was injected intravenously. Full-ring PET imaging was performed from the head to foot after the radiotracer. CT data was obtained and used for attenuation correction and anatomic  localization. Fasting blood glucose: 102 mg/dl COMPARISON:  MR lumbar spine 11/28/2022 FINDINGS: Mediastinal blood pool activity: SUV max 2.1 HEAD/NECK: No hypermetabolic activity in the scalp. No hypermetabolic cervical lymph nodes. Incidental CT findings: none CHEST: No hypermetabolic mediastinal or hilar nodes. No suspicious pulmonary nodules on the CT scan. Incidental CT findings: Chronic interstitial thickening in the lungs. ABDOMEN/PELVIS: No abnormal hypermetabolic activity within the liver, pancreas, adrenal glands, or spleen. No hypermetabolic lymph nodes in the abdomen or pelvis. Incidental CT findings: none SKELETON: Focal radiotracer activity within the L1 vertebral body with SUV max equal 5.1 (image 209). This activity is 1 vertebral body level lower than the vertebroplasty for compression fracture at T12. Mild radiotracer activity associated with the superior endplate of the L 3 vertebral body with SUV max equal 3.8. Metabolic activity associated the posterior LEFT eighth rib with SUV max equal 3.9 on image 77. No clear CT changes. Focal uptake in the medial aspect of the RIGHT eighth rib at the costovertebral junction. Favor degenerative uptake. Incidental CT findings: No sclerotic or lytic lesions on CT imaging. EXTREMITIES: No abnormal hypermetabolic activity in the lower extremities. Incidental CT findings: none IMPRESSION: 1. Focal uptake in the L1 vertebral body concern for multiple myeloma however a traumatic compression fractures also in the differential. Augmentation of the vertebral body one level above. 2. Uptake in the posterior LEFT eighth rib is concerning for myeloma. 3. Two smaller lesions are indeterminate for degenerative change versus myeloma. 4. No on typical lytic lesions on the CT portion exam. Electronically Signed   By: Genevive Bi M.D.   On: 12/25/2022 13:20   IR KYPHO THORACIC WITH BONE BIOPSY  Addendum Date: 12/17/2022   ADDENDUM REPORT: 12/17/2022 16:11 ADDENDUM:  The operated vertebral body was T12, as amended in portions of the report as detailed below. PROCEDURE: The patient was positioned prone on the exam table. A unipedicular access was planned. Skin entry site was marked overlying the T12 vertebral body using fluoroscopy. IMPRESSION: 1. Successful T12 vertebral body augmentation using balloon kyphoplasty. Electronically Signed   By: Olive Bass M.D.   On:  12/17/2022 16:11   Result Date: 12/17/2022 INDICATION: T12 compression fracture EXAM: T12 vertebral body augmentation using balloon kyphoplasty COMPARISON:  None Available. MEDICATIONS: Documented in the EMR ANESTHESIA/SEDATION: Moderate (conscious) sedation was employed during this procedure. A total of Versed 2.5 mg and Fentanyl 75 mcg was administered intravenously by the radiology nurse. Total intra-service moderate Sedation Time: 35 minutes. The patient's level of consciousness and vital signs were monitored continuously by radiology nursing throughout the procedure under my direct supervision. FLUOROSCOPY: Radiation Exposure Index (as provided by the fluoroscopic device): 5.6 minutes (44 mGy) COMPLICATIONS: None immediate. PROCEDURE: Informed written consent was obtained from the patient after a thorough discussion of the procedural risks, benefits and alternatives. All questions were addressed. Maximal Sterile Barrier Technique was utilized including caps, mask, sterile gowns, sterile gloves, sterile drape, hand hygiene and skin antiseptic. A timeout was performed prior to the initiation of the procedure. The patient was positioned prone on the exam table. A unipedicular access was planned. Skin entry site was marked overlying the L1 vertebral body using fluoroscopy. The overlying skin was then prepped and draped in the standard sterile fashion. Local analgesia was obtained with 1% lidocaine. Attention was turned to the right side. Under fluoroscopic guidance, a 10 gauge introducer needle was advanced  towards the lateral margin of the pedicle. Using multiple projections, the introducer needle was advanced towards the posterior margin of the vertebral body via a transpedicular approach. The inner needle was then removed, and the drill was advanced towards the anterior margin of the vertebral body. A Kyphon 15 mm inflatable bone tamp was then advanced through the transpedicular access needle and positioned within the mid vertebral body. Kyphoplasty was then performed, ensuring that the balloon contours stayed within the vertebral body margins. The balloon was then deflated and removed, followed by advancement of the bone filler device and the instillation of acrylic bone cement with excellent filling in the AP and lateral projections. No extravasation was noted in the disk spaces or posteriorly into the spinal canal. No epidural venous contamination was seen. At the end of the procedure, the introducer cannula and bone filler device were removed without difficulty. A clean dressing was placed after hemostasis. The patient tolerated all aspects of the procedure well, and was transferred to recovery in stable condition. IMPRESSION: 1. Successful L1 vertebral body augmentation using balloon kyphoplasty. If the patient has known osteoporosis, recommend treatment as clinically indicated. If the patient's bone density status is unknown, DEXA scan is recommended. Electronically Signed: By: Olive Bass M.D. On: 12/06/2022 10:48   DG HIP UNILAT WITH PELVIS 2-3 VIEWS RIGHT  Result Date: 12/14/2022 CLINICAL DATA:  Pain. EXAM: DG HIP (WITH OR WITHOUT PELVIS) 3V RIGHT COMPARISON:  None Available. FINDINGS: Bilateral hip osteophytes and joint space narrowing. Pelvic ring intact. No fracture, dislocation or subluxation. IMPRESSION: Bilateral hip degenerative changes. Electronically Signed   By: Layla Maw M.D.   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 CT-guided bone marrow aspiration and core biopsy 2. CT-guided core needle biopsy of focal bone lesion of the L1 vertebral body MEDICATIONS: None. ANESTHESIA/SEDATION: Moderate (conscious) sedation was employed during this procedure. A total of Versed 2 mg and Fentanyl 100 mcg was administered intravenously. Moderate Sedation Time: 55 minutes. The patient's level of consciousness and vital signs were monitored continuously by radiology nursing throughout the procedure under my direct supervision. FLUOROSCOPY TIME:  N/a COMPLICATIONS: None immediate. PROCEDURE: Informed written consent was obtained from the patient after a thorough discussion of the procedural risks, benefits and alternatives. All questions were addressed. Maximal Sterile Barrier Technique was utilized including caps, mask, sterile gowns, sterile gloves, sterile drape, hand hygiene and skin antiseptic. A timeout was performed prior to the initiation of the procedure. Bone marrow aspiration and core biopsy: The patient was placed prone on the CT exam table. Limited CT of the pelvis was performed for planning purposes. Skin entry site was marked, and the overlying skin was prepped and draped in the standard sterile fashion. Local analgesia was obtained with 1% lidocaine. Using CT guidance, an 11 gauge needle was advanced just deep to the cortex of the right posterior ilium. Subsequently, bone marrow aspiration and core biopsy were performed. Specimens were submitted to lab/pathology for handling. Hemostasis was achieved with manual pressure, and a clean dressing was placed. The patient tolerated the procedure well without immediate complication. L1 vertebral body lesion core biopsy: The patient was placed prone on the exam table. Limited CT of the lumbar spine was performed for planning purposes. This demonstrated no discernible bone lesion, and therefore biopsy route was planned  based on anatomic landmarks from corresponding MRI lumbar spine. Skin entry site was marked, and the overlying skin was prepped and draped in the standard sterile fashion. Local analgesia was obtained with 1% lidocaine. Using intermittent CT fluoroscopy, an 11 gauge bone biopsy needle was advanced towards the expected location of the L1 vertebral body lesion using anatomic landmarks. Subsequently, a single 11 gauge core was obtained. Specimens were submitted in formalin to pathology for further handling. Limited postprocedure imaging demonstrated no complicating feature. The patient tolerated the procedure well, and was transferred to recovery in stable condition. IMPRESSION: 1. Successful bone marrow aspiration and core biopsy of the right posterior ilium. 2. Successful CT-guided core needle biopsy of focal lesion of the L1 vertebral body. Electronically Signed   By: Olive Bass M.D.   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 CT-guided bone marrow aspiration and core biopsy 2. CT-guided core needle biopsy of focal bone lesion of the L1 vertebral body MEDICATIONS: None. ANESTHESIA/SEDATION: Moderate (conscious) sedation was employed during this procedure. A total of Versed 2 mg and Fentanyl 100 mcg was administered intravenously. Moderate Sedation Time: 55 minutes. The patient's level of consciousness and vital signs were monitored continuously by radiology nursing throughout the procedure under my direct supervision. FLUOROSCOPY TIME:  N/a COMPLICATIONS: None immediate. PROCEDURE: Informed written consent was obtained from the patient after a thorough discussion of the procedural risks, benefits and alternatives. All questions were addressed. Maximal Sterile Barrier Technique was utilized including caps, mask, sterile gowns, sterile gloves, sterile drape, hand hygiene and  skin antiseptic. A timeout was performed prior to the initiation of the procedure. Bone marrow aspiration and core biopsy: The patient was placed prone on the CT exam table. Limited CT of the pelvis was performed for planning purposes. Skin entry site was marked, and the overlying skin was prepped and draped in the standard sterile fashion. Local analgesia was obtained with 1% lidocaine. Using CT guidance, an 11 gauge needle was advanced just deep to the cortex of the right posterior ilium. Subsequently, bone marrow aspiration and core biopsy were performed. Specimens were submitted to lab/pathology for handling. Hemostasis was achieved with manual pressure, and a clean dressing was  placed. The patient tolerated the procedure well without immediate complication. L1 vertebral body lesion core biopsy: The patient was placed prone on the exam table. Limited CT of the lumbar spine was performed for planning purposes. This demonstrated no discernible bone lesion, and therefore biopsy route was planned based on anatomic landmarks from corresponding MRI lumbar spine. Skin entry site was marked, and the overlying skin was prepped and draped in the standard sterile fashion. Local analgesia was obtained with 1% lidocaine. Using intermittent CT fluoroscopy, an 11 gauge bone biopsy needle was advanced towards the expected location of the L1 vertebral body lesion using anatomic landmarks. Subsequently, a single 11 gauge core was obtained. Specimens were submitted in formalin to pathology for further handling. Limited postprocedure imaging demonstrated no complicating feature. The patient tolerated the procedure well, and was transferred to recovery in stable condition. IMPRESSION: 1. Successful bone marrow aspiration and core biopsy of the right posterior ilium. 2. Successful CT-guided core needle biopsy of focal lesion of the L1 vertebral body. Electronically Signed   By: Olive Bass M.D.   On: 12/05/2022 13:14     PERFORMANCE STATUS (ECOG) : 1 - Symptomatic but completely ambulatory  Review of Systems Unless otherwise noted, a complete review of systems is negative.  Physical Exam General: NAD Cardiovascular: regular rate and rhythm Pulmonary: clear ant fields Abdomen: soft, nontender, + bowel sounds GU: no suprapubic tenderness Extremities: no edema, no joint deformities Skin: no rashes Neurological: Weakness but otherwise nonfocal  IMPRESSION: Follow-up visit.  Patient seen in infusion.  Patient endorses ongoing and persistent.  He is status post kyphoplasty on 12/06/2022.  Patient is pending initiation of XRT.  Patient says that has been taking oxycodone on average 4-5 times daily.  He finds it slightly helpful but says that it only lasts a couple of hours prior to return of severe pain.  Of note, patient developed opioid-induced constipation requiring initiation of Movantik.  Patient says that constipation is better managed now.  Discussed adjustment of his pain regimen.  Will start patient on a long-acting opioid to try to better control pain around-the-clock.  PLAN: -Continue current scope of treatment -Start OxyContin 10 mg every 12 hours -Continue oxycodone as needed for breakthrough pain -Daily bowel regimen -Naloxone -RTC 2 weeks   Patient expressed understanding and was in agreement with this plan. He also understands that He can call the clinic at any time with any questions, concerns, or complaints.     Time Total: 15 minutes  Visit consisted of counseling and education dealing with the complex and emotionally intense issues of symptom management and palliative care in the setting of serious and potentially life-threatening illness.Greater than 50%  of this time was spent counseling and coordinating care related to the above assessment and plan.  Signed by: Laurette Schimke, PhD, NP-C

## 2023-01-02 NOTE — Patient Instructions (Signed)
[facility] [place] [facility]  Discharge Instructions: Thank you for choosing [facility] to provide your oncology and hematology care.  If you have a lab appointment with the Cancer Center, please go directly to the Cancer Center and check in at the registration area.  Wear comfortable clothing and clothing appropriate for easy access to any Portacath or PICC line.   We strive to give you quality time with your provider. You may need to reschedule your appointment if you arrive late (15 or more minutes).  Arriving late affects you and other patients whose appointments are after yours.  Also, if you miss three or more appointments without notifying the office, you may be dismissed from the clinic at the provider's discretion.      For prescription refill requests, have your pharmacy contact our office and allow 72 hours for refills to be completed.     To help prevent nausea and vomiting after your treatment, we encourage you to take your nausea medication as directed.  BELOW ARE SYMPTOMS THAT SHOULD BE REPORTED IMMEDIATELY: *FEVER GREATER THAN 100.4 F (38 C) OR HIGHER *CHILLS OR SWEATING *NAUSEA AND VOMITING THAT IS NOT CONTROLLED WITH YOUR NAUSEA MEDICATION *UNUSUAL SHORTNESS OF BREATH *UNUSUAL BRUISING OR BLEEDING *URINARY PROBLEMS (pain or burning when urinating, or frequent urination) *BOWEL PROBLEMS (unusual diarrhea, constipation, pain near the anus) TENDERNESS IN MOUTH AND THROAT WITH OR WITHOUT PRESENCE OF ULCERS (sore throat, sores in mouth, or a toothache) UNUSUAL RASH, SWELLING OR PAIN  UNUSUAL VAGINAL DISCHARGE OR ITCHING   Items with * indicate a potential emergency and should be followed up as soon as possible or go to the Emergency Department if any problems should occur.  Please show the CHEMOTHERAPY ALERT CARD or IMMUNOTHERAPY ALERT CARD at check-in to the Emergency Department and triage nurse.  Should you have questions after your visit  or need to cancel or reschedule your appointment, please contact [facility] [place] [facility]  [phone] and follow the prompts.  Office hours are 8:00 a.m. to 4:30 p.m. Monday - Friday. Please note that voicemails left after 4:00 p.m. may not be returned until the following business day.  We are closed weekends and major holidays. You have access to a nurse at all times for urgent questions. Please call the main number to the clinic [phone] and follow the prompts.  For any non-urgent questions, you may also contact your provider using MyChart. We now offer e-Visits for anyone 18 and older to request care online for non-urgent symptoms. For details visit [email or web address].   Also download the MyChart app! Go to the app store, search "MyChart", open the app, select [place], and log in with your MyChart username and password.    

## 2023-01-03 ENCOUNTER — Telehealth: Payer: Self-pay | Admitting: *Deleted

## 2023-01-03 ENCOUNTER — Inpatient Hospital Stay: Payer: Medicare HMO | Admitting: Hospice and Palliative Medicine

## 2023-01-03 ENCOUNTER — Other Ambulatory Visit: Payer: Self-pay | Admitting: Hospice and Palliative Medicine

## 2023-01-03 MED ORDER — GABAPENTIN 100 MG PO CAPS: 100.0000 mg | ORAL_CAPSULE | Freq: Three times a day (TID) | ORAL | 0 refills | Status: DC

## 2023-01-03 MED ORDER — MORPHINE SULFATE ER 15 MG PO TBCR: 15.0000 mg | EXTENDED_RELEASE_TABLET | Freq: Two times a day (BID) | ORAL | 0 refills | Status: DC

## 2023-01-03 NOTE — Telephone Encounter (Signed)
Oxycontin was denied by insurance.  Josh to send new script for ms contin . I called [facility] and updated pharmacy to let them know the oxy was denied by insurance and new script for ms contin was sent to pharmacy. Pharmacy thanked me for update and will try to run the mscontin through system.Marland Kitchen

## 2023-01-03 NOTE — Telephone Encounter (Signed)
Call returned to [name] and informed that prescription was sent to [facility] for his hiccups and it should also help with his pain as well. She was grateful and thanked [place]

## 2023-01-03 NOTE — Progress Notes (Signed)
Will start gabapentin for hiccups and pain.

## 2023-01-03 NOTE — Telephone Encounter (Signed)
Call from wife reporting that patient has had hiccups since last night and is asking if this is from the chemotherapy treatment and what can be done. Please advise

## 2023-01-03 NOTE — Addendum Note (Signed)
Addended by: Malachy Moan on: 01/03/2023 04:35 PM   Modules accepted: Orders

## 2023-01-03 NOTE — Telephone Encounter (Signed)
Per wife he has no other symptoms, and the hiccups come and go he is able to eat and drink without problems

## 2023-01-03 NOTE — Progress Notes (Signed)
Insurance denied coverage of OxyContin. Will send rx for MS Contin, which is on approved formulary.

## 2023-01-03 NOTE — Telephone Encounter (Signed)
Per insurance- pt's oxycontin er needed a PA. - this was submitted via cover my meds. Pending approval by insurance.

## 2023-01-06 ENCOUNTER — Ambulatory Visit
Admission: RE | Admit: 2023-01-06 | Discharge: 2023-01-06 | Disposition: A | Discharge status: Home / Self Care | Payer: Medicare HMO | Source: Ambulatory Visit | Attending: Radiation Oncology | Admitting: Radiation Oncology

## 2023-01-06 DIAGNOSIS — Z510 Encounter for antineoplastic radiation therapy: Secondary | ICD-10-CM | POA: Diagnosis not present

## 2023-01-07 ENCOUNTER — Other Ambulatory Visit: Payer: Self-pay

## 2023-01-07 ENCOUNTER — Ambulatory Visit
Admission: RE | Admit: 2023-01-07 | Discharge: 2023-01-07 | Disposition: A | Discharge status: Home / Self Care | Payer: Medicare HMO | Source: Ambulatory Visit | Attending: Radiation Oncology | Admitting: Radiation Oncology

## 2023-01-07 ENCOUNTER — Encounter: Payer: Self-pay | Admitting: Internal Medicine

## 2023-01-07 DIAGNOSIS — Z510 Encounter for antineoplastic radiation therapy: Secondary | ICD-10-CM | POA: Diagnosis not present

## 2023-01-07 LAB — RAD ONC ARIA SESSION SUMMARY
Course Elapsed Days: 0
Plan Fractions Treated to Date: 1
Plan Prescribed Dose Per Fraction: 2.5 Gy
Plan Total Fractions Prescribed: 10
Plan Total Prescribed Dose: 25 Gy
Reference Point Dosage Given to Date: 2.5 Gy
Reference Point Session Dosage Given: 2.5 Gy
Session Number: 1

## 2023-01-07 NOTE — Telephone Encounter (Signed)
Opened encounter in error  

## 2023-01-08 ENCOUNTER — Other Ambulatory Visit: Payer: Self-pay

## 2023-01-08 ENCOUNTER — Ambulatory Visit
Admission: RE | Admit: 2023-01-08 | Discharge: 2023-01-08 | Disposition: A | Discharge status: Home / Self Care | Payer: Medicare HMO | Source: Ambulatory Visit | Attending: Radiation Oncology | Admitting: Radiation Oncology

## 2023-01-08 DIAGNOSIS — Z510 Encounter for antineoplastic radiation therapy: Secondary | ICD-10-CM | POA: Diagnosis not present

## 2023-01-08 LAB — RAD ONC ARIA SESSION SUMMARY
Course Elapsed Days: 1
Plan Fractions Treated to Date: 2
Plan Prescribed Dose Per Fraction: 2.5 Gy
Plan Total Fractions Prescribed: 10
Plan Total Prescribed Dose: 25 Gy
Reference Point Dosage Given to Date: 5 Gy
Reference Point Session Dosage Given: 2.5 Gy
Session Number: 2

## 2023-01-09 ENCOUNTER — Inpatient Hospital Stay: Payer: Medicare HMO

## 2023-01-09 ENCOUNTER — Other Ambulatory Visit: Payer: Self-pay

## 2023-01-09 ENCOUNTER — Inpatient Hospital Stay (HOSPITAL_BASED_OUTPATIENT_CLINIC_OR_DEPARTMENT_OTHER): Payer: Medicare HMO | Admitting: Hospice and Palliative Medicine

## 2023-01-09 ENCOUNTER — Ambulatory Visit
Admission: RE | Admit: 2023-01-09 | Discharge: 2023-01-09 | Disposition: A | Discharge status: Home / Self Care | Payer: Medicare HMO | Source: Ambulatory Visit | Attending: Radiation Oncology | Admitting: Radiation Oncology

## 2023-01-09 ENCOUNTER — Inpatient Hospital Stay (HOSPITAL_BASED_OUTPATIENT_CLINIC_OR_DEPARTMENT_OTHER): Payer: Medicare HMO | Admitting: Internal Medicine

## 2023-01-09 VITALS — BP 107/58 | HR 62 | Temp 98.0°F

## 2023-01-09 VITALS — BP 112/59 | HR 62 | Temp 98.0°F | Wt 136.5 lb

## 2023-01-09 DIAGNOSIS — C9000 Multiple myeloma not having achieved remission: Secondary | ICD-10-CM

## 2023-01-09 DIAGNOSIS — K5903 Drug induced constipation: Secondary | ICD-10-CM

## 2023-01-09 DIAGNOSIS — G893 Neoplasm related pain (acute) (chronic): Secondary | ICD-10-CM | POA: Diagnosis not present

## 2023-01-09 DIAGNOSIS — Z515 Encounter for palliative care: Secondary | ICD-10-CM | POA: Diagnosis not present

## 2023-01-09 DIAGNOSIS — Z5112 Encounter for antineoplastic immunotherapy: Secondary | ICD-10-CM | POA: Diagnosis not present

## 2023-01-09 DIAGNOSIS — Z510 Encounter for antineoplastic radiation therapy: Secondary | ICD-10-CM | POA: Diagnosis not present

## 2023-01-09 LAB — RAD ONC ARIA SESSION SUMMARY
Course Elapsed Days: 2
Plan Fractions Treated to Date: 3
Plan Prescribed Dose Per Fraction: 2.5 Gy
Plan Total Fractions Prescribed: 10
Plan Total Prescribed Dose: 25 Gy
Reference Point Dosage Given to Date: 7.5 Gy
Reference Point Session Dosage Given: 2.5 Gy
Session Number: 3

## 2023-01-09 LAB — CBC WITH DIFFERENTIAL (CANCER CENTER ONLY)
Abs Immature Granulocytes: 0.01 10*3/uL (ref 0.00–0.07)
Basophils Absolute: 0 10*3/uL (ref 0.0–0.1)
Basophils Relative: 0 %
Eosinophils Absolute: 0 10*3/uL (ref 0.0–0.5)
Eosinophils Relative: 1 %
HCT: 31 % — ABNORMAL LOW (ref 39.0–52.0)
Hemoglobin: 10.9 g/dL — ABNORMAL LOW (ref 13.0–17.0)
Immature Granulocytes: 0 %
Lymphocytes Relative: 27 %
Lymphs Abs: 0.6 10*3/uL — ABNORMAL LOW (ref 0.7–4.0)
MCH: 35 pg — ABNORMAL HIGH (ref 26.0–34.0)
MCHC: 35.2 g/dL (ref 30.0–36.0)
MCV: 99.7 fL (ref 80.0–100.0)
Monocytes Absolute: 0.3 10*3/uL (ref 0.1–1.0)
Monocytes Relative: 12 %
Neutro Abs: 1.4 10*3/uL — ABNORMAL LOW (ref 1.7–7.7)
Neutrophils Relative %: 60 %
Platelet Count: 196 10*3/uL (ref 150–400)
RBC: 3.11 MIL/uL — ABNORMAL LOW (ref 4.22–5.81)
RDW: 15.6 % — ABNORMAL HIGH (ref 11.5–15.5)
WBC Count: 2.3 10*3/uL — ABNORMAL LOW (ref 4.0–10.5)
nRBC: 0 % (ref 0.0–0.2)

## 2023-01-09 MED ORDER — MONTELUKAST SODIUM 10 MG PO TABS
10.0000 mg | ORAL_TABLET | Freq: Once | ORAL | Status: AC
  Administered 2023-01-09: 10 mg via ORAL
  Filled 2023-01-09: qty 1

## 2023-01-09 MED ORDER — DARATUMUMAB-HYALURONIDASE-FIHJ 1800-30000 MG-UT/15ML ~~LOC~~ SOLN
1800.0000 mg | Freq: Once | SUBCUTANEOUS | Status: AC
  Administered 2023-01-09: 1800 mg via SUBCUTANEOUS
  Filled 2023-01-09: qty 15

## 2023-01-09 MED ORDER — ACETAMINOPHEN 325 MG PO TABS
650.0000 mg | ORAL_TABLET | Freq: Once | ORAL | Status: AC
  Administered 2023-01-09: 650 mg via ORAL
  Filled 2023-01-09: qty 2

## 2023-01-09 MED ORDER — DIPHENHYDRAMINE HCL 25 MG PO CAPS
50.0000 mg | ORAL_CAPSULE | Freq: Once | ORAL | Status: AC
  Administered 2023-01-09: 50 mg via ORAL
  Filled 2023-01-09: qty 2

## 2023-01-09 MED ORDER — DEXAMETHASONE 4 MG PO TABS
20.0000 mg | ORAL_TABLET | Freq: Once | ORAL | Status: AC
  Administered 2023-01-09: 20 mg via ORAL
  Filled 2023-01-09: qty 5

## 2023-01-09 NOTE — Progress Notes (Signed)
Patient is doing a little better, his appetite is a little better, and his pain level has gone down a little. The patients wife didn't fill the morphine due to the narcan that came with it. The patient had the hiccups for a good 24 hours after his last visit, which is when they said he took a cap that had 6 different kinds of pills in it. So they seem to think the 6 pills are related to him having those his hiccups. He took the gabapentin and it didn't seem to help, the hiccups just went away on there on.

## 2023-01-09 NOTE — Progress Notes (Signed)
Palliative Medicine [facility] Telephone:([phone]) [phone] [phone]) [phone]   Name: Melvin Gilmore Date: 01/09/2023 MRN: [id number]  DOB: Jan 14, 1941  Patient Care Team: [name], MD as PCP - General (Internal Medicine)    REASON FOR CONSULTATION: Melvin Gilmore is a 82 y.o. male with multiple medical problems including history of prostate cancer status post HIFU, now with multiple myeloma.  MRI thoracic and lumbar spine showed a subacute compression fracture of T12 and focal enhancing marrow replacing lesions in T2, T5, T6, T8, and L1.  Palliative Care consulted to address goals and manage ongoing symptoms.  SOCIAL HISTORY:     reports that he has never smoked. He has never used smokeless tobacco. He reports current alcohol use of about 7.0 standard drinks of alcohol per week. He reports that he does not use drugs.  Patient is married lives home with his wife.  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   Prostate cancer (HCC) 2023   Seasonal allergies    Skin cancer    a.) ears   Wears partial dentures    lower    PAST SURGICAL HISTORY:  Past Surgical History:  Procedure Laterality Date   APPENDECTOMY     CATARACT EXTRACTION W/PHACO Right 01/15/2016   Procedure: CATARACT EXTRACTION PHACO AND INTRAOCULAR LENS PLACEMENT (IOC);  Surgeon: Sherald Hess, MD;  Location: [facility];  Service: Ophthalmology;   Laterality: Right;  RIGHT CALL CELL PHONE WITH TIME   CHOLECYSTECTOMY     COLONOSCOPY     COLONOSCOPY WITH ESOPHAGOGASTRODUODENOSCOPY (EGD)     ESOPHAGOGASTRODUODENOSCOPY  07/2021   ESOPHAGOGASTRODUODENOSCOPY (EGD) WITH PROPOFOL N/A 09/23/2017   Procedure: ESOPHAGOGASTRODUODENOSCOPY (EGD) WITH PROPOFOL;  Surgeon: Toledo, Boykin Nearing, MD;  Location: [facility];  Service: Gastroenterology;  Laterality: N/A;   HIGH INTENSITY FOCUSED ULTRASOUND (HIFU) OF THE PROSTATE N/A 02/07/2022   Procedure: HIGH INTENSITY FOCUSED ULTRASOUND (HIFU) OF THE PROSTATE;  Surgeon: Orson Ape, MD;  Location: [facility];  Service: Urology;  Laterality: N/A;   HYDROCELE EXCISION / REPAIR     IR KYPHO THORACIC WITH BONE BIOPSY  12/06/2022   IR RADIOLOGIST EVAL & MGMT  11/21/2022   LASIX RT EYE     PROSTATE BIOPSY N/A 01/03/2022   Procedure: PROSTATE BIOPSY  Addison Thiam;  Surgeon: Orson Ape, MD;  Location: [facility];  Service: Urology;  Laterality: N/A;   ROTATOR CUFF REPAIR Left 2001    HEMATOLOGY/ONCOLOGY HISTORY:  Oncology History  Multiple myeloma (HCC)  12/05/2022 Cancer Staging   Staging form: Plasma Cell Myeloma and Plasma Cell Disorders, AJCC 8th Edition - Clinical stage from 12/05/2022: RISS Stage II (Beta-2-microglobulin (mg/L): 3.7, Albumin (g/dL): 3.6, ISS: Stage II, High-risk cytogenetics: Absent, LDH: Normal) - Signed by [name], MD on 12/26/2022 Stage prefix: Initial diagnosis Beta 2 microglobulin range (mg/L): 3.5 to 5.49 Albumin  range (g/dL): Greater than or equal to 3.5 Cytogenetics: 1q addition, Other mutation   12/13/2022 Initial Diagnosis   Multiple myeloma (HCC)   12/26/2022 -  Chemotherapy   Patient is on Treatment Plan : MYELOMA NEWLY DIAGNOSED Daratumumab IV + Lenalidomide + Dexamethasone Weekly (DaraRd) q28d       ALLERGIES:  is allergic to amoxicillin-pot clavulanate and meloxicam.  MEDICATIONS:  Current Outpatient Medications  Medication Sig Dispense Refill    acetaminophen (TYLENOL) 500 MG tablet Take 500 mg by mouth every 6 (six) hours as needed.     acyclovir (ZOVIRAX) 400 MG tablet Take 400 mg by mouth 2 (two) times daily.     ALPRAZolam (XANAX) 0.5 MG tablet Take 0.25-0.5 mg by mouth at bedtime as needed for anxiety.     aspirin EC 81 MG tablet Take 1 tablet (81 mg total) by mouth daily. Swallow whole. 90 tablet 2   cetirizine (ZYRTEC) 10 MG tablet Take 10 mg by mouth as needed for allergies.     docusate sodium (COLACE) 100 MG capsule Take 2 capsules (200 mg total) by mouth 2 (two) times daily. 120 capsule 3   fluticasone (FLONASE) 50 MCG/ACT nasal spray Place 1 spray into both nostrils daily.     gabapentin (NEURONTIN) 100 MG capsule Take 1 capsule (100 mg total) by mouth 3 (three) times daily. 30 capsule 0   lactulose (CHRONULAC) 10 GM/15ML solution Take 10-20 GM (15-30 ml) one - two times a day as needed for constipation 475 mL 0   lenalidomide (REVLIMID) 10 MG capsule Take 1 capsule (10 mg total) by mouth daily. Take for 21 days, then hold for 7 days. Repeat every 28 days. (Patient not taking: Reported on 12/18/2022) 21 capsule 0   levothyroxine (SYNTHROID, LEVOTHROID) 50 MCG tablet Take 50 mcg by mouth daily before breakfast.     morphine (MS CONTIN) 15 MG 12 hr tablet Take 1 tablet (15 mg total) by mouth every 12 (twelve) hours. 30 tablet 0   naloxone (NARCAN) nasal spray 4 mg/0.1 mL SPRAY 1 SPRAY INTO ONE NOSTRIL AS DIRECTED FOR OPIOID OVERDOSE (TURN PERSON ON SIDE AFTER DOSE. IF NO RESPONSE IN 2-3 MINUTES OR PERSON RESPONDS BUT RELAPSES, REPEAT USING A NEW SPRAY DEVICE AND SPRAY INTO THE OTHER NOSTRIL. CALL 911 AFTER USE.) * EMERGENCY USE ONLY * 1 each 0   omeprazole (PRILOSEC) 40 MG capsule Take 40 mg by mouth every morning.     ondansetron (ZOFRAN) 8 MG tablet Take 1 tablet (8 mg total) by mouth every 8 (eight) hours as needed for nausea or vomiting. 30 tablet 1   oxyCODONE (OXY IR/ROXICODONE) 5 MG immediate release tablet Take 1-2 tablets  (5-10 mg total) by mouth every 4 (four) hours as needed for severe pain. 60 tablet 0   Plant Sterols and Stanols (CHOLESTOFF) 450 MG TABS Take 1 tablet by mouth every morning.     predniSONE (DELTASONE) 10 MG tablet Take 1 tablet (10mg ) daily x 1 week, then take 5mg  (1/2 tablet) daily x 1 week, then stop. 15 tablet 0   Probiotic Product (PROBIOTIC ADVANCED PO) Take 1 tablet by mouth at bedtime.     senna (SENOKOT) 8.6 MG TABS tablet Take 2 tablets (17.2 mg total) by mouth daily as needed for mild constipation. 60 tablet 2   tadalafil (CIALIS) 5 MG tablet Take 5 mg by mouth daily as needed for erectile dysfunction. (Patient not taking: Reported on 12/18/2022)     No current facility-administered medications for this  visit.    VITAL SIGNS: There were no vitals taken for this visit. There were no vitals filed for this visit.  Estimated body mass index is 19.11 kg/m as calculated from the following:   Height as of 12/05/22: 5\' 11"  (1.803 m).   Weight as of 01/02/23: 137 lb (62.1 kg).  LABS: CBC:    Component Value Date/Time   WBC 4.6 01/02/2023 0912   WBC 4.7 12/05/2022 0746   HGB 11.6 (L) 01/02/2023 0912   HCT 33.0 (L) 01/02/2023 0912   PLT 163 01/02/2023 0912   MCV 98.8 01/02/2023 0912   NEUTROABS 3.2 01/02/2023 0912   LYMPHSABS 1.0 01/02/2023 0912   MONOABS 0.3 01/02/2023 0912   EOSABS 0.1 01/02/2023 0912   BASOSABS 0.0 01/02/2023 0912   Comprehensive Metabolic Panel:    Component Value Date/Time   NA 132 (L) 12/13/2022 1454   K 4.8 12/13/2022 1454   CL 93 (L) 12/13/2022 1454   CO2 27 12/13/2022 1454   BUN 20 12/13/2022 1454   CREATININE 1.04 12/13/2022 1454   GLUCOSE 104 (H) 12/13/2022 1454   CALCIUM 9.5 12/13/2022 1454   AST 23 12/13/2022 1454   ALT 17 12/13/2022 1454   ALKPHOS 73 12/13/2022 1454   BILITOT 0.5 12/13/2022 1454   PROT 9.2 (H) 12/13/2022 1454   ALBUMIN 3.6 12/13/2022 1454    RADIOGRAPHIC STUDIES: NM PET Image Initial (PI) Whole Body  Result Date:  12/25/2022 CLINICAL DATA:  Initial treatment strategy for multiple myeloma. EXAM: NUCLEAR MEDICINE PET WHOLE BODY TECHNIQUE: 8.6 mCi F-18 FDG was injected intravenously. Full-ring PET imaging was performed from the head to foot after the radiotracer. CT data was obtained and used for attenuation correction and anatomic localization. Fasting blood glucose: 102 mg/dl COMPARISON:  MR lumbar spine 11/28/2022 FINDINGS: Mediastinal blood pool activity: SUV max 2.1 HEAD/NECK: No hypermetabolic activity in the scalp. No hypermetabolic cervical lymph nodes. Incidental CT findings: none CHEST: No hypermetabolic mediastinal or hilar nodes. No suspicious pulmonary nodules on the CT scan. Incidental CT findings: Chronic interstitial thickening in the lungs. ABDOMEN/PELVIS: No abnormal hypermetabolic activity within the liver, pancreas, adrenal glands, or spleen. No hypermetabolic lymph nodes in the abdomen or pelvis. Incidental CT findings: none SKELETON: Focal radiotracer activity within the L1 vertebral body with SUV max equal 5.1 (image 209). This activity is 1 vertebral body level lower than the vertebroplasty for compression fracture at T12. Mild radiotracer activity associated with the superior endplate of the L 3 vertebral body with SUV max equal 3.8. Metabolic activity associated the posterior LEFT eighth rib with SUV max equal 3.9 on image 77. No clear CT changes. Focal uptake in the medial aspect of the RIGHT eighth rib at the costovertebral junction. Favor degenerative uptake. Incidental CT findings: No sclerotic or lytic lesions on CT imaging. EXTREMITIES: No abnormal hypermetabolic activity in the lower extremities. Incidental CT findings: none IMPRESSION: 1. Focal uptake in the L1 vertebral body concern for multiple myeloma however a traumatic compression fractures also in the differential. Augmentation of the vertebral body one level above. 2. Uptake in the posterior LEFT eighth rib is concerning for myeloma. 3.  Two smaller lesions are indeterminate for degenerative change versus myeloma. 4. No on typical lytic lesions on the CT portion exam. Electronically Signed   By: Genevive Bi M.D.   On: 12/25/2022 13:20   DG HIP UNILAT WITH PELVIS 2-3 VIEWS RIGHT  Result Date: 12/14/2022 CLINICAL DATA:  Pain. EXAM: DG HIP (WITH OR WITHOUT PELVIS) 3V RIGHT  COMPARISON:  None Available. FINDINGS: Bilateral hip osteophytes and joint space narrowing. Pelvic ring intact. No fracture, dislocation or subluxation. IMPRESSION: Bilateral hip degenerative changes. Electronically Signed   By: Layla Maw M.D.   On: 12/14/2022 11:59    PERFORMANCE STATUS (ECOG) : 1 - Symptomatic but completely ambulatory  Review of Systems Unless otherwise noted, a complete review of systems is negative.  Physical Exam General: NAD Cardiovascular: regular rate and rhythm Pulmonary: clear ant fields Abdomen: soft, nontender, + bowel sounds GU: no suprapubic tenderness Extremities: no edema, no joint deformities Skin: no rashes Neurological: Weakness but otherwise nonfocal  IMPRESSION: Follow-up visit.    Patient denies any changes or concerns today.  No new symptomatic complaints.  Patient says he developed intractable hiccups last chemotherapy.  Was started on gabapentin for hiccups and pain with resolution of symptoms.  Patient was also given MS Contin given of report early breakthrough pain on oxycodone.  However, patient and wife had some concerns which we discussed at length today. All questions answered.   PLAN: -Continue current scope of treatment -Continue oxycodone as needed for breakthrough pain -MS Contin 15mg  Q12H -Continue gabapentin -Daily bowel regimen -Telephone visit 1 month   Patient expressed understanding and was in agreement with this plan. He also understands that He can call the clinic at any time with any questions, concerns, or complaints.     Time Total: 15 minutes  Visit consisted of  counseling and education dealing with the complex and emotionally intense issues of symptom management and palliative care in the setting of serious and potentially life-threatening illness.Greater than 50%  of this time was spent counseling and coordinating care related to the above assessment and plan.  Signed by: Laurette Schimke, PhD, NP-C

## 2023-01-09 NOTE — Progress Notes (Signed)
Nutrition Assessment   Reason for Assessment:  Referral from Dr Alena Bills   ASSESSMENT:  82 year old male with multiple myeloma.  Past medical history of GERD, prostate cancer, HLD, HTN, meniere disease.  Patient receiving darzalex faspro  Met with patient and wife during infusion.  Reports that he does not have much of an appetite and food does not taste good.  Has tried oral nutrition supplements but does not really like the taste.  Reports constipation.     Medications: zofran, prednisone, lactulose, senna, omeprazole   Labs: reviewed   Anthropometrics:   Height: 71 inches Weight: 136 lb 8 oz today UBW: 145 lb on 12/26/22 BMI: 19  6% weight loss in the last 3 weeks, significant   Estimated Energy Needs  Kcals: 1800-2100 Protein: 90-105 g Fluid: 1800-2100 ml   NUTRITION DIAGNOSIS: Inadequate oral intake related to cancer, related treatment side effects (taste alterations) as evidenced by 6% weight loss in the last 3 weeks and decreased intake    INTERVENTION:  Discussed strategies to help with taste change. Handout provided Discussed ways to add calories and protein to diet.  Handout provided Samples of ensure complete, boost plus, [employer] 1.4, boost [facility] provided to patient along with coupons. Discussed using shake as base of smoothie.  Contact information provided   MONITORING, EVALUATION, GOAL: weight trends, intake   Next Visit: Thursday, July 11 during infusion  Shalane Florendo B. Freida Busman, RD, LDN Registered Dietitian [phone]

## 2023-01-09 NOTE — Progress Notes (Signed)
Pt observed for 30 minutes post Darzalex SQ injection per order. VSS. Pt stable for discharge and all questions answered.   Melvin Gilmore [employer]

## 2023-01-09 NOTE — Progress Notes (Signed)
[facility] CONSULT NOTE  Patient Care Team: [name], MD as PCP - General (Internal Medicine)   CANCER STAGING   Cancer Staging  Multiple myeloma [facility]) Staging form: Plasma Cell Myeloma and Plasma Cell Disorders, AJCC 8th Edition - Clinical stage from 12/05/2022: RISS Stage II (Beta-2-microglobulin (mg/L): 3.7, Albumin (g/dL): 3.6, ISS: Stage II, High-risk cytogenetics: Absent, LDH: Normal) - Signed by [name], MD on 12/26/2022 Stage prefix: Initial diagnosis Beta 2 microglobulin range (mg/L): 3.5 to 5.49 Albumin range (g/dL): Greater than or equal to 3.5 Cytogenetics: 1q addition, Other mutation   ASSESSMENT & PLAN:  Melvin Gilmore 82 y.o. male with pmh of hypertension, hyperlipidemia, anxiety, GERD, BPH, hypothyroidism, prostate cancer s/p HIFU was seen by primary on November 13, 2022 for acute onset of lower back pain.  Was referred to medical oncology for finding of L1 lesion suspicious for metastasis.  # Multiple myeloma, RISS Stage II -MRI thoracic and lumbar spine with and without contrast was reviewed. Showed unchanged subacute compression fracture at T12.  Focal enhancing marrow replacing lesions in T6, T8, T5, T2 all suspicious for metastatic disease.  L1 lesion measuring 16 mm.  Multilevel degenerative changes present.  -SPEP/IFE showed biclonal IgA kappa paraprotein with M spike 2.1 g/dL and second spike at 0.4 g/dL.  Kappa 453, lambda 6.8 with a ratio of 66.7.  Iron panel and B12 are normal.  PSA 3.44.  CBC showed mild anemia 13.4, chronic.  On CMP normal creatinine and calcium levels.  Elevated total protein 8.8.  LDH normal.  Beta-2 microglobulin 3.7.  Albumin 3.6.  24-hour urine/UPEP showed 1.1 g protein with no M spike.  -BMBx Hypercellular marrow with 60% plasma cells.  IHC positive for CD138/ mum 1.  Kappa restricted consistent with multiple myeloma. L1 vertebral body biopsy showed numerous plasma cells. Cytogenetics - del (13q) and gain 1.     -PET/CT showed activity in L1 and left eighth rib concerning for myeloma.  -Started on Dara-RD on 12/26/2022.  Tolerating well.  Continue with Revlimid 10 mg once daily 3 weeks on 1 week off.  Continue with weekly dexamethasone 20 mg.  CBC reviewed.  WBC 2.3 with ANC of 1.4.  Will proceed with daratumumab today cycle 1 day 15.  Recheck CBC in 1 week.  # T12 pathological fracture # Cancer-related pain - s/p kyphoplasty by Dr. Elijio Miles on 12/06/22.  Initially had improvement but now pain is worse. -Vicodin did not work.  On short acting oxycodone 5 mg every 4 hours as needed.  Following with palliative.  He is also started on MS Contin 15 mg twice daily. -Getting palliative XRT for pain control  # Opioid-induced constipation -Manageable with senna, MiraLAX, lactulose, Movantik as needed.  # History of prostate cancer - follows with Dr. Evelene Croon, urology for elevated PSA.  MRI prostate showed 1 mm category 5 lesion of anterior transition zone.  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 s/p HIFU of the prostate on 02/07/2022.  PSA level from 10/08/2022 was 3.34.  -PSA from 11/21/2022 3.44.  # Normocytic anemia -Chronic, hemoglobin 13.4.   -Iron panel, B12 and folate normal.  # Poor appetite # Weight loss -Could be from cancer and uncontrolled pain.  Pain management as above. -Nutrition following  Orders Placed This Encounter  Procedures   CBC with Differential (Cancer Center Only)    Standing Status:   Future    Standing Expiration Date:   01/24/2024  CBC with Differential (Cancer Center Only)    Standing Status:   Future    Standing Expiration Date:   01/30/2024   Comprehensive metabolic panel    Standing Status:   Future    Standing Expiration Date:   01/09/2024   RTC in 1 week for MD visit, labs, cycle 1- day 22 of daratumumab  The total time spent in the appointment was 30 minutes encounter with patients including review of chart  and various tests results, discussions about plan of care and coordination of care plan   All questions were answered. The patient knows to call the clinic with any problems, questions or concerns. No barriers to learning was detected.  [name], MD 6/20/202411:16 AM   HISTORY OF PRESENTING ILLNESS:  Melvin Gilmore 82 y.o. male with past medical history of hypertension, hyperlipidemia, anxiety, GERD, BPH, hypothyroidism, prostate cancer s/p HIFU was seen by primary on November 13, 2022 for acute onset of lower back pain.  Patient reports that he was sitting outside and had violent sneeze which was followed by severe back pain.  He has fallen allergies.  Had x-rays followed by MRI thoracic spine without contrast.  It showed compression fracture of T12 superior endplate with surrounding marrow edema likely subacute.  15 mm T1 hypointense marrow lesion in the anterior aspect of L1 suspicious for metastasis.  Further characterization with postcontrast MRI recommended.  Prostate cancer-follows with Dr. Evelene Croon, urology for elevated PSA.  MRI prostate showed 1 mm category 5 lesion of anterior transition zone.  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 s/p HIFU of the prostate on 02/07/2022.  PSA level from 10/08/2022 was 3.34.  Interval history Patient seen today accompanied with his wife to prior to cycle 1 day 15 of daratumumab.  Patient is tolerating treatment well overall.  He is noticing small but gradual improvement in his back pain.  Undergoing radiation and tolerating well.  Had not started long-acting morphine due to few questions and confusion with Narcan.  This was all cleared by palliative and plan to start that soon.  Constipation is under control with senna, lactulose and Movantik as needed.  Appetite is fair.  Had an episode lasting for 24 hours of uncontrolled pickup.  Unclear as to why it happened.  But resolved on its own.  Patient  did not think gabapentin helped much.  And so he discontinued.  I have reviewed his chart and materials related to his cancer extensively and collaborated history with the patient. Summary of oncologic history is as follows: Oncology History  Multiple myeloma (HCC)  12/05/2022 Cancer Staging   Staging form: Plasma Cell Myeloma and Plasma Cell Disorders, AJCC 8th Edition - Clinical stage from 12/05/2022: RISS Stage II (Beta-2-microglobulin (mg/L): 3.7, Albumin (g/dL): 3.6, ISS: Stage II, High-risk cytogenetics: Absent, LDH: Normal) - Signed by [name], MD on 12/26/2022 Stage prefix: Initial diagnosis Beta 2 microglobulin range (mg/L): 3.5 to 5.49 Albumin range (g/dL): Greater than or equal to 3.5 Cytogenetics: 1q addition, Other mutation   12/13/2022 Initial Diagnosis   Multiple myeloma (HCC)   12/26/2022 -  Chemotherapy   Patient is on Treatment Plan : MYELOMA NEWLY DIAGNOSED Daratumumab IV + Lenalidomide + Dexamethasone Weekly (DaraRd) q28d       MEDICAL HISTORY:  Past Medical History:  Diagnosis Date   Anxiety    a.) on BZO (alprazolam) PRN   Cervicalgia    Chest pain, non-cardiac    Elevated PSA  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   Distal with moderate free air surrounding Hiatal Hernia   Gallbladder polyp    Gastritis    GERD (gastroesophageal reflux disease)    History of hiatal hernia    HLD (hyperlipidemia)    HTN (hypertension)    Hypothyroidism    Meniere disease    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;  Service: Ophthalmology;  Laterality: Right;   RIGHT CALL CELL PHONE WITH TIME   CHOLECYSTECTOMY     COLONOSCOPY     COLONOSCOPY WITH ESOPHAGOGASTRODUODENOSCOPY (EGD)     ESOPHAGOGASTRODUODENOSCOPY  07/2021   ESOPHAGOGASTRODUODENOSCOPY (EGD) WITH PROPOFOL N/A 09/23/2017   Procedure: ESOPHAGOGASTRODUODENOSCOPY (EGD) WITH PROPOFOL;  Surgeon: Toledo, Boykin Nearing, MD;  Location: [facility];  Service: Gastroenterology;  Laterality: N/A;   HIGH INTENSITY FOCUSED ULTRASOUND (HIFU) OF THE PROSTATE N/A 02/07/2022   Procedure: HIGH INTENSITY FOCUSED ULTRASOUND (HIFU) OF THE PROSTATE;  Surgeon: Orson Ape, MD;  Location: [facility];  Service: Urology;  Laterality: N/A;   HYDROCELE EXCISION / REPAIR     IR KYPHO THORACIC WITH BONE BIOPSY  12/06/2022   IR RADIOLOGIST EVAL & MGMT  11/21/2022   LASIX RT EYE     PROSTATE BIOPSY N/A 01/03/2022   Procedure: PROSTATE BIOPSY  Addison Harral;  Surgeon: Orson Ape, MD;  Location: [facility];  Service: Urology;  Laterality: N/A;   ROTATOR CUFF REPAIR Left 2001    SOCIAL HISTORY: Social History   Socioeconomic History   Marital status: Married    Spouse name: Not on file   Number of children: Not on file   Years of education: Not on file   Highest education level: Not on file  Occupational History   Not on file  Tobacco Use   Smoking status: Never   Smokeless tobacco: Never  Vaping Use   Vaping Use: Never used  Substance and Sexual Activity   Alcohol use: Yes    Alcohol/week: 7.0 standard drinks of alcohol    Types: 7 Glasses of wine per week    Comment: wine occ   Drug use: No   Sexual activity: Not on file  Other Topics Concern   Not on file  Social History Narrative   Not on file   Social Determinants of Health   Financial Resource Strain: Low Risk  (11/21/2022)   Overall Financial Resource Strain (CARDIA)    Difficulty of Paying Living Expenses: Not hard at all  Food Insecurity: No Food Insecurity (11/21/2022)   Hunger Vital Sign    Worried About Running Out of Food in the Last Year:  Never true    Ran Out of Food in the Last Year: Never true  Transportation Needs: Not on file  Physical Activity: Not on file  Stress: Not on file  Social Connections: Not on file  Intimate Partner Violence: Not At Risk (11/21/2022)   Humiliation, Afraid, Rape, and Kick questionnaire    Fear of Current or Ex-Partner: No    Emotionally Abused: No    Physically Abused: No    Sexually  Abused: No    FAMILY HISTORY: Family History  Problem Relation Age of Onset   Pneumonia Father     ALLERGIES:  is allergic to amoxicillin-pot clavulanate and meloxicam.  MEDICATIONS:  Current Outpatient Medications  Medication Sig Dispense Refill   acetaminophen (TYLENOL) 500 MG tablet Take 500 mg by mouth every 6 (six) hours as needed.     acyclovir (ZOVIRAX) 400 MG tablet Take 400 mg by mouth 2 (two) times daily.     ALPRAZolam (XANAX) 0.5 MG tablet Take 0.25-0.5 mg by mouth at bedtime as needed for anxiety.     aspirin EC 81 MG tablet Take 1 tablet (81 mg total) by mouth daily. Swallow whole. 90 tablet 2   cetirizine (ZYRTEC) 10 MG tablet Take 10 mg by mouth as needed for allergies.     docusate sodium (COLACE) 100 MG capsule Take 2 capsules (200 mg total) by mouth 2 (two) times daily. 120 capsule 3   fluticasone (FLONASE) 50 MCG/ACT nasal spray Place 1 spray into both nostrils daily.     gabapentin (NEURONTIN) 100 MG capsule Take 1 capsule (100 mg total) by mouth 3 (three) times daily. 30 capsule 0   lactulose (CHRONULAC) 10 GM/15ML solution Take 10-20 GM (15-30 ml) one - two times a day as needed for constipation 475 mL 0   lenalidomide (REVLIMID) 10 MG capsule Take 1 capsule (10 mg total) by mouth daily. Take for 21 days, then hold for 7 days. Repeat every 28 days. 21 capsule 0   levothyroxine (SYNTHROID, LEVOTHROID) 50 MCG tablet Take 50 mcg by mouth daily before breakfast.     morphine (MS CONTIN) 15 MG 12 hr tablet Take 1 tablet (15 mg total) by mouth every 12 (twelve) hours. 30 tablet 0    naloxone (NARCAN) nasal spray 4 mg/0.1 mL SPRAY 1 SPRAY INTO ONE NOSTRIL AS DIRECTED FOR OPIOID OVERDOSE (TURN PERSON ON SIDE AFTER DOSE. IF NO RESPONSE IN 2-3 MINUTES OR PERSON RESPONDS BUT RELAPSES, REPEAT USING A NEW SPRAY DEVICE AND SPRAY INTO THE OTHER NOSTRIL. CALL 911 AFTER USE.) * EMERGENCY USE ONLY * 1 each 0   omeprazole (PRILOSEC) 40 MG capsule Take 40 mg by mouth every morning.     ondansetron (ZOFRAN) 8 MG tablet Take 1 tablet (8 mg total) by mouth every 8 (eight) hours as needed for nausea or vomiting. 30 tablet 1   oxyCODONE (OXY IR/ROXICODONE) 5 MG immediate release tablet Take 1-2 tablets (5-10 mg total) by mouth every 4 (four) hours as needed for severe pain. 60 tablet 0   Plant Sterols and Stanols (CHOLESTOFF) 450 MG TABS Take 1 tablet by mouth every morning.     predniSONE (DELTASONE) 10 MG tablet Take 1 tablet (10mg ) daily x 1 week, then take 5mg  (1/2 tablet) daily x 1 week, then stop. 15 tablet 0   Probiotic Product (PROBIOTIC ADVANCED PO) Take 1 tablet by mouth at bedtime.     senna (SENOKOT) 8.6 MG TABS tablet Take 2 tablets (17.2 mg total) by mouth daily as needed for mild constipation. 60 tablet 2   tadalafil (CIALIS) 5 MG tablet Take 5 mg by mouth daily as needed for erectile dysfunction. (Patient not taking: Reported on 12/18/2022)     No current facility-administered medications for this visit.   Facility-Administered Medications Ordered in Other Visits  Medication Dose Route Frequency Provider Last Rate Last Admin   daratumumab-hyaluronidase-fihj (DARZALEX FASPRO) 1800-30000 MG-UT/15ML chemo SQ injection 1,800 mg  1,800 mg Subcutaneous Once [name], MD  REVIEW OF SYSTEMS:   Pertinent information mentioned in HPI All other systems were reviewed with the patient and are negative.  PHYSICAL EXAMINATION: ECOG PERFORMANCE STATUS: 1 - Symptomatic but completely ambulatory  Vitals:   01/09/23 0942  BP: (!) 112/59  Pulse: 62  Temp: 98 F (36.7 C)   SpO2: 100%    Filed Weights   01/09/23 0942  Weight: 136 lb 8 oz (61.9 kg)     GENERAL:alert, no distress and comfortable SKIN: skin color, texture, turgor are normal, no rashes or significant lesions EYES: normal, conjunctiva are pink and non-injected, sclera clear OROPHARYNX:no exudate, no erythema and lips, buccal mucosa, and tongue normal  NECK: supple, thyroid normal size, non-tender, without nodularity LYMPH:  no palpable lymphadenopathy in the cervical, axillary or inguinal LUNGS: clear to auscultation and percussion with normal breathing effort HEART: regular rate & rhythm and no murmurs and no lower extremity edema ABDOMEN:abdomen soft, non-tender and normal bowel sounds Musculoskeletal:no cyanosis of digits and no clubbing  PSYCH: alert & oriented x 3 with fluent speech NEURO: no focal motor/sensory deficits  LABORATORY DATA:  I have reviewed the data as listed Lab Results  Component Value Date   WBC 2.3 (L) 01/09/2023   HGB 10.9 (L) 01/09/2023   HCT 31.0 (L) 01/09/2023   MCV 99.7 01/09/2023   PLT 196 01/09/2023   Recent Labs    02/07/22 0631 12/13/22 1454  NA 133* 132*  K 3.7 4.8  CL 100 93*  CO2 26 27  GLUCOSE 93 104*  BUN 13 20  CREATININE 1.05 1.04  CALCIUM 9.5 9.5  GFRNONAA >60 >60  PROT  --  9.2*  ALBUMIN  --  3.6  AST  --  23  ALT  --  17  ALKPHOS  --  73  BILITOT  --  0.5    RADIOGRAPHIC STUDIES: I have personally reviewed the radiological images as listed and agreed with the findings in the report. NM PET Image Initial (PI) Whole Body  Result Date: 12/25/2022 CLINICAL DATA:  Initial treatment strategy for multiple myeloma. EXAM: NUCLEAR MEDICINE PET WHOLE BODY TECHNIQUE: 8.6 mCi F-18 FDG was injected intravenously. Full-ring PET imaging was performed from the head to foot after the radiotracer. CT data was obtained and used for attenuation correction and anatomic localization. Fasting blood glucose: 102 mg/dl COMPARISON:  MR lumbar spine  11/28/2022 FINDINGS: Mediastinal blood pool activity: SUV max 2.1 HEAD/NECK: No hypermetabolic activity in the scalp. No hypermetabolic cervical lymph nodes. Incidental CT findings: none CHEST: No hypermetabolic mediastinal or hilar nodes. No suspicious pulmonary nodules on the CT scan. Incidental CT findings: Chronic interstitial thickening in the lungs. ABDOMEN/PELVIS: No abnormal hypermetabolic activity within the liver, pancreas, adrenal glands, or spleen. No hypermetabolic lymph nodes in the abdomen or pelvis. Incidental CT findings: none SKELETON: Focal radiotracer activity within the L1 vertebral body with SUV max equal 5.1 (image 209). This activity is 1 vertebral body level lower than the vertebroplasty for compression fracture at T12. Mild radiotracer activity associated with the superior endplate of the L 3 vertebral body with SUV max equal 3.8. Metabolic activity associated the posterior LEFT eighth rib with SUV max equal 3.9 on image 77. No clear CT changes. Focal uptake in the medial aspect of the RIGHT eighth rib at the costovertebral junction. Favor degenerative uptake. Incidental CT findings: No sclerotic or lytic lesions on CT imaging. EXTREMITIES: No abnormal hypermetabolic activity in the lower extremities. Incidental CT findings: none IMPRESSION: 1. Focal uptake in the  L1 vertebral body concern for multiple myeloma however a traumatic compression fractures also in the differential. Augmentation of the vertebral body one level above. 2. Uptake in the posterior LEFT eighth rib is concerning for myeloma. 3. Two smaller lesions are indeterminate for degenerative change versus myeloma. 4. No on typical lytic lesions on the CT portion exam. Electronically Signed   By: Genevive Bi M.D.   On: 12/25/2022 13:20   DG HIP UNILAT WITH PELVIS 2-3 VIEWS RIGHT  Result Date: 12/14/2022 CLINICAL DATA:  Pain. EXAM: DG HIP (WITH OR WITHOUT PELVIS) 3V RIGHT COMPARISON:  None Available. FINDINGS: Bilateral  hip osteophytes and joint space narrowing. Pelvic ring intact. No fracture, dislocation or subluxation. IMPRESSION: Bilateral hip degenerative changes. Electronically Signed   By: Layla Maw M.D.   On: 12/14/2022 11:59

## 2023-01-09 NOTE — Patient Instructions (Signed)
[facility]  Discharge Instructions: Thank you for choosing [facility] to provide your oncology and hematology care.  If you have a lab appointment with the Cancer Center, please go directly to the Cancer Center and check in at the registration area.  Wear comfortable clothing and clothing appropriate for easy access to any Portacath or PICC line.   We strive to give you quality time with your provider. You may need to reschedule your appointment if you arrive late (15 or more minutes).  Arriving late affects you and other patients whose appointments are after yours.  Also, if you miss three or more appointments without notifying the office, you may be dismissed from the clinic at the provider's discretion.      For prescription refill requests, have your pharmacy contact our office and allow 72 hours for refills to be completed.    Today you received the following chemotherapy and/or immunotherapy agents darzalex    To help prevent nausea and vomiting after your treatment, we encourage you to take your nausea medication as directed.  BELOW ARE SYMPTOMS THAT SHOULD BE REPORTED IMMEDIATELY: *FEVER GREATER THAN 100.4 F (38 C) OR HIGHER *CHILLS OR SWEATING *NAUSEA AND VOMITING THAT IS NOT CONTROLLED WITH YOUR NAUSEA MEDICATION *UNUSUAL SHORTNESS OF BREATH *UNUSUAL BRUISING OR BLEEDING *URINARY PROBLEMS (pain or burning when urinating, or frequent urination) *BOWEL PROBLEMS (unusual diarrhea, constipation, pain near the anus) TENDERNESS IN MOUTH AND THROAT WITH OR WITHOUT PRESENCE OF ULCERS (sore throat, sores in mouth, or a toothache) UNUSUAL RASH, SWELLING OR PAIN  UNUSUAL VAGINAL DISCHARGE OR ITCHING   Items with * indicate a potential emergency and should be followed up as soon as possible or go to the Emergency Department if any problems should occur.  Please show the CHEMOTHERAPY ALERT CARD or IMMUNOTHERAPY ALERT CARD at check-in to  the Emergency Department and triage nurse.  Should you have questions after your visit or need to cancel or reschedule your appointment, please contact [facility]  [phone] and follow the prompts.  Office hours are 8:00 a.m. to 4:30 p.m. Monday - Friday. Please note that voicemails left after 4:00 p.m. may not be returned until the following business day.  We are closed weekends and major holidays. You have access to a nurse at all times for urgent questions. Please call the main number to the clinic [phone] and follow the prompts.  For any non-urgent questions, you may also contact your provider using MyChart. We now offer e-Visits for anyone 18 and older to request care online for non-urgent symptoms. For details visit [email or web address].   Also download the MyChart app! Go to the app store, search "MyChart", open the app, select [facility], and log in with your MyChart username and password.    

## 2023-01-10 ENCOUNTER — Ambulatory Visit
Admission: RE | Admit: 2023-01-10 | Discharge: 2023-01-10 | Disposition: A | Discharge status: Home / Self Care | Payer: Medicare HMO | Source: Ambulatory Visit | Attending: Radiation Oncology | Admitting: Radiation Oncology

## 2023-01-10 ENCOUNTER — Other Ambulatory Visit: Payer: Self-pay

## 2023-01-10 DIAGNOSIS — Z510 Encounter for antineoplastic radiation therapy: Secondary | ICD-10-CM | POA: Diagnosis not present

## 2023-01-10 LAB — RAD ONC ARIA SESSION SUMMARY
Course Elapsed Days: 3
Plan Fractions Treated to Date: 4
Plan Prescribed Dose Per Fraction: 2.5 Gy
Plan Total Fractions Prescribed: 10
Plan Total Prescribed Dose: 25 Gy
Reference Point Dosage Given to Date: 10 Gy
Reference Point Session Dosage Given: 2.5 Gy
Session Number: 4

## 2023-01-10 LAB — KAPPA/LAMBDA LIGHT CHAINS
Kappa free light chain: 295 mg/L — ABNORMAL HIGH (ref 3.3–19.4)
Kappa, lambda light chain ratio: 30.1 — ABNORMAL HIGH (ref 0.26–1.65)
Lambda free light chains: 9.8 mg/L (ref 5.7–26.3)

## 2023-01-10 NOTE — Addendum Note (Signed)
Addended byMichaelyn Barter on: 01/10/2023 01:54 PM   Modules accepted: Orders

## 2023-01-13 ENCOUNTER — Telehealth: Payer: Self-pay | Admitting: *Deleted

## 2023-01-13 ENCOUNTER — Telehealth: Payer: Self-pay | Admitting: Internal Medicine

## 2023-01-13 ENCOUNTER — Inpatient Hospital Stay: Payer: Medicare HMO

## 2023-01-13 ENCOUNTER — Ambulatory Visit
Admission: RE | Admit: 2023-01-13 | Discharge: 2023-01-13 | Disposition: A | Discharge status: Home / Self Care | Payer: Medicare HMO | Source: Ambulatory Visit | Attending: Radiation Oncology | Admitting: Radiation Oncology

## 2023-01-13 ENCOUNTER — Other Ambulatory Visit: Payer: Self-pay | Admitting: *Deleted

## 2023-01-13 ENCOUNTER — Other Ambulatory Visit: Payer: Self-pay

## 2023-01-13 DIAGNOSIS — Z5112 Encounter for antineoplastic immunotherapy: Secondary | ICD-10-CM

## 2023-01-13 DIAGNOSIS — C9000 Multiple myeloma not having achieved remission: Secondary | ICD-10-CM

## 2023-01-13 DIAGNOSIS — Z510 Encounter for antineoplastic radiation therapy: Secondary | ICD-10-CM | POA: Diagnosis not present

## 2023-01-13 LAB — RAD ONC ARIA SESSION SUMMARY
Course Elapsed Days: 6
Plan Fractions Treated to Date: 5
Plan Prescribed Dose Per Fraction: 2.5 Gy
Plan Total Fractions Prescribed: 10
Plan Total Prescribed Dose: 25 Gy
Reference Point Dosage Given to Date: 12.5 Gy
Reference Point Session Dosage Given: 2.5 Gy
Session Number: 5

## 2023-01-13 LAB — COMPREHENSIVE METABOLIC PANEL
ALT: 16 U/L (ref 0–44)
AST: 16 U/L (ref 15–41)
Albumin: 3.2 g/dL — ABNORMAL LOW (ref 3.5–5.0)
Alkaline Phosphatase: 113 U/L (ref 38–126)
Anion gap: 7 (ref 5–15)
BUN: 21 mg/dL (ref 8–23)
CO2: 25 mmol/L (ref 22–32)
Calcium: 8.1 mg/dL — ABNORMAL LOW (ref 8.9–10.3)
Chloride: 97 mmol/L — ABNORMAL LOW (ref 98–111)
Creatinine, Ser: 0.81 mg/dL (ref 0.61–1.24)
GFR, Estimated: >60 mL/min (ref >60)
Glucose, Bld: 96 mg/dL (ref 70–99)
Potassium: 3.8 mmol/L (ref 3.5–5.1)
Sodium: 129 mmol/L — ABNORMAL LOW (ref 135–145)
Total Bilirubin: 0.8 mg/dL (ref 0.3–1.2)
Total Protein: 6.3 g/dL — ABNORMAL LOW (ref 6.5–8.1)

## 2023-01-13 LAB — CBC WITH DIFFERENTIAL/PLATELET
Abs Immature Granulocytes: 0 10*3/uL (ref 0.00–0.07)
Basophils Absolute: 0 10*3/uL (ref 0.0–0.1)
Basophils Relative: 0 %
Eosinophils Absolute: 0 10*3/uL (ref 0.0–0.5)
Eosinophils Relative: 2 %
HCT: 28.9 % — ABNORMAL LOW (ref 39.0–52.0)
Hemoglobin: 10.3 g/dL — ABNORMAL LOW (ref 13.0–17.0)
Immature Granulocytes: 0 %
Lymphocytes Relative: 34 %
Lymphs Abs: 0.4 10*3/uL — ABNORMAL LOW (ref 0.7–4.0)
MCH: 35.4 pg — ABNORMAL HIGH (ref 26.0–34.0)
MCHC: 35.6 g/dL (ref 30.0–36.0)
MCV: 99.3 fL (ref 80.0–100.0)
Monocytes Absolute: 0.1 10*3/uL (ref 0.1–1.0)
Monocytes Relative: 12 %
Neutro Abs: 0.5 10*3/uL — ABNORMAL LOW (ref 1.7–7.7)
Neutrophils Relative %: 52 %
Platelets: 161 10*3/uL (ref 150–400)
RBC: 2.91 MIL/uL — ABNORMAL LOW (ref 4.22–5.81)
RDW: 15.8 % — ABNORMAL HIGH (ref 11.5–15.5)
WBC: 1 10*3/uL — ABNORMAL LOW (ref 4.0–10.5)
nRBC: 0 % (ref 0.0–0.2)

## 2023-01-13 NOTE — Telephone Encounter (Signed)
Melvin Gilmore called asking to speak with Josh prior to patient appointment Thursday regarading the side effects he had from his last treatment. Please return her call.[phone]

## 2023-01-13 NOTE — Telephone Encounter (Signed)
I reviewed the labs from today.  Called the patient wife and informed that WBC has significantly decreased from 1.4-0.5.  Advised to completely stop the Revlimid.  To monitor her temperature twice a day and for any temperature more than 100.4 to inform the provider or go to emergency room.  I will follow-up with him on Thursday and repeat CBC with differential.  If his ANC is lasting more than 7 days I will consider starting him on prophylactic antibiotics.  Also, after the treatment he has intractable hiccups that usually last for 12 hours.  May be related to some medication he is getting during the treatment.  I discussed with the wife that it would be unusual for any of these medications to cause the hiccups but will have to try taking 1 out at a time and find out which 1 might be causing it.

## 2023-01-14 ENCOUNTER — Other Ambulatory Visit: Payer: Self-pay

## 2023-01-14 ENCOUNTER — Ambulatory Visit
Admission: RE | Admit: 2023-01-14 | Discharge: 2023-01-14 | Disposition: A | Discharge status: Home / Self Care | Payer: Medicare HMO | Source: Ambulatory Visit | Attending: Radiation Oncology | Admitting: Radiation Oncology

## 2023-01-14 ENCOUNTER — Telehealth: Payer: Self-pay

## 2023-01-14 ENCOUNTER — Encounter: Payer: Self-pay | Admitting: Internal Medicine

## 2023-01-14 DIAGNOSIS — Z510 Encounter for antineoplastic radiation therapy: Secondary | ICD-10-CM | POA: Diagnosis not present

## 2023-01-14 LAB — RAD ONC ARIA SESSION SUMMARY
Course Elapsed Days: 7
Plan Fractions Treated to Date: 6
Plan Prescribed Dose Per Fraction: 2.5 Gy
Plan Total Fractions Prescribed: 10
Plan Total Prescribed Dose: 25 Gy
Reference Point Dosage Given to Date: 15 Gy
Reference Point Session Dosage Given: 2.5 Gy
Session Number: 6

## 2023-01-14 LAB — MULTIPLE MYELOMA PANEL, SERUM
Albumin SerPl Elph-Mcnc: 3.4 g/dL (ref 2.9–4.4)
Albumin/Glob SerPl: 1 (ref 0.7–1.7)
Alpha 1: 0.3 g/dL (ref 0.0–0.4)
Alpha2 Glob SerPl Elph-Mcnc: 0.6 g/dL (ref 0.4–1.0)
B-Globulin SerPl Elph-Mcnc: 0.8 g/dL (ref 0.7–1.3)
Gamma Glob SerPl Elph-Mcnc: 1.9 g/dL — ABNORMAL HIGH (ref 0.4–1.8)
Globulin, Total: 3.6 g/dL (ref 2.2–3.9)
IgA: 1783 mg/dL — ABNORMAL HIGH (ref 61–437)
IgG (Immunoglobin G), Serum: 449 mg/dL — ABNORMAL LOW (ref 603–1613)
IgM (Immunoglobulin M), Srm: 8 mg/dL — ABNORMAL LOW (ref 15–143)
M Protein SerPl Elph-Mcnc: 1.2 g/dL — ABNORMAL HIGH
Total Protein ELP: 7 g/dL (ref 6.0–8.5)

## 2023-01-14 NOTE — Telephone Encounter (Signed)
Spoke with patient's wife.  Patient continues to have 10 to 12 hours of intractable hiccups following chemotherapy.  Symptoms have thus subsided.  Patient did try the gabapentin but it did not appear to help.  Wife reports the patient has significant history of GERD and GI symptoms.  Discussed with Dr. Alena Bills about possible trial of Pepcid as premedication to chemotherapy.

## 2023-01-14 NOTE — Telephone Encounter (Signed)
Filled patients medication lenalidomide (Revlimid)  Authorization #: [id number]

## 2023-01-15 ENCOUNTER — Ambulatory Visit
Admission: RE | Admit: 2023-01-15 | Discharge: 2023-01-15 | Disposition: A | Discharge status: Home / Self Care | Payer: Medicare HMO | Source: Ambulatory Visit | Attending: Radiation Oncology | Admitting: Radiation Oncology

## 2023-01-15 ENCOUNTER — Other Ambulatory Visit: Payer: Self-pay

## 2023-01-15 DIAGNOSIS — Z510 Encounter for antineoplastic radiation therapy: Secondary | ICD-10-CM | POA: Diagnosis not present

## 2023-01-15 LAB — RAD ONC ARIA SESSION SUMMARY
Course Elapsed Days: 8
Plan Fractions Treated to Date: 7
Plan Prescribed Dose Per Fraction: 2.5 Gy
Plan Total Fractions Prescribed: 10
Plan Total Prescribed Dose: 25 Gy
Reference Point Dosage Given to Date: 17.5 Gy
Reference Point Session Dosage Given: 2.5 Gy
Session Number: 7

## 2023-01-16 ENCOUNTER — Telehealth: Payer: Self-pay | Admitting: *Deleted

## 2023-01-16 ENCOUNTER — Ambulatory Visit
Admission: RE | Admit: 2023-01-16 | Discharge: 2023-01-16 | Disposition: A | Discharge status: Home / Self Care | Payer: Medicare HMO | Source: Ambulatory Visit | Attending: Radiation Oncology | Admitting: Radiation Oncology

## 2023-01-16 ENCOUNTER — Inpatient Hospital Stay: Payer: Medicare HMO

## 2023-01-16 ENCOUNTER — Encounter: Payer: Self-pay | Admitting: Internal Medicine

## 2023-01-16 ENCOUNTER — Other Ambulatory Visit: Payer: Self-pay

## 2023-01-16 ENCOUNTER — Inpatient Hospital Stay (HOSPITAL_BASED_OUTPATIENT_CLINIC_OR_DEPARTMENT_OTHER): Payer: Medicare HMO | Admitting: Hospice and Palliative Medicine

## 2023-01-16 ENCOUNTER — Inpatient Hospital Stay (HOSPITAL_BASED_OUTPATIENT_CLINIC_OR_DEPARTMENT_OTHER): Payer: Medicare HMO | Admitting: Internal Medicine

## 2023-01-16 VITALS — BP 129/64 | HR 61 | Temp 98.1°F | Wt 141.2 lb

## 2023-01-16 DIAGNOSIS — C9000 Multiple myeloma not having achieved remission: Secondary | ICD-10-CM

## 2023-01-16 DIAGNOSIS — Z510 Encounter for antineoplastic radiation therapy: Secondary | ICD-10-CM | POA: Diagnosis not present

## 2023-01-16 DIAGNOSIS — Z515 Encounter for palliative care: Secondary | ICD-10-CM | POA: Diagnosis not present

## 2023-01-16 DIAGNOSIS — R630 Anorexia: Secondary | ICD-10-CM | POA: Diagnosis not present

## 2023-01-16 DIAGNOSIS — G893 Neoplasm related pain (acute) (chronic): Secondary | ICD-10-CM | POA: Diagnosis not present

## 2023-01-16 DIAGNOSIS — Z5112 Encounter for antineoplastic immunotherapy: Secondary | ICD-10-CM | POA: Diagnosis not present

## 2023-01-16 LAB — RAD ONC ARIA SESSION SUMMARY
Course Elapsed Days: 9
Plan Fractions Treated to Date: 8
Plan Prescribed Dose Per Fraction: 2.5 Gy
Plan Total Fractions Prescribed: 10
Plan Total Prescribed Dose: 25 Gy
Reference Point Dosage Given to Date: 20 Gy
Reference Point Session Dosage Given: 2.5 Gy
Session Number: 8

## 2023-01-16 LAB — COMPREHENSIVE METABOLIC PANEL
ALT: 17 U/L (ref 0–44)
AST: 18 U/L (ref 15–41)
Albumin: 3.5 g/dL (ref 3.5–5.0)
Alkaline Phosphatase: 124 U/L (ref 38–126)
Anion gap: 8 (ref 5–15)
BUN: 12 mg/dL (ref 8–23)
CO2: 24 mmol/L (ref 22–32)
Calcium: 8.5 mg/dL — ABNORMAL LOW (ref 8.9–10.3)
Chloride: 97 mmol/L — ABNORMAL LOW (ref 98–111)
Creatinine, Ser: 0.93 mg/dL (ref 0.61–1.24)
GFR, Estimated: >60 mL/min (ref >60)
Glucose, Bld: 99 mg/dL (ref 70–99)
Potassium: 4 mmol/L (ref 3.5–5.1)
Sodium: 129 mmol/L — ABNORMAL LOW (ref 135–145)
Total Bilirubin: 0.5 mg/dL (ref 0.3–1.2)
Total Protein: 6.6 g/dL (ref 6.5–8.1)

## 2023-01-16 LAB — CBC WITH DIFFERENTIAL (CANCER CENTER ONLY)
Abs Immature Granulocytes: 0.01 10*3/uL (ref 0.00–0.07)
Basophils Absolute: 0 10*3/uL (ref 0.0–0.1)
Basophils Relative: 0 %
Eosinophils Absolute: 0 10*3/uL (ref 0.0–0.5)
Eosinophils Relative: 2 %
HCT: 30.5 % — ABNORMAL LOW (ref 39.0–52.0)
Hemoglobin: 10.8 g/dL — ABNORMAL LOW (ref 13.0–17.0)
Immature Granulocytes: 1 %
Lymphocytes Relative: 43 %
Lymphs Abs: 0.5 10*3/uL — ABNORMAL LOW (ref 0.7–4.0)
MCH: 35.1 pg — ABNORMAL HIGH (ref 26.0–34.0)
MCHC: 35.4 g/dL (ref 30.0–36.0)
MCV: 99 fL (ref 80.0–100.0)
Monocytes Absolute: 0.2 10*3/uL (ref 0.1–1.0)
Monocytes Relative: 19 %
Neutro Abs: 0.4 10*3/uL — CL (ref 1.7–7.7)
Neutrophils Relative %: 35 %
Platelet Count: 127 10*3/uL — ABNORMAL LOW (ref 150–400)
RBC: 3.08 MIL/uL — ABNORMAL LOW (ref 4.22–5.81)
RDW: 16 % — ABNORMAL HIGH (ref 11.5–15.5)
WBC Count: 1.3 10*3/uL — ABNORMAL LOW (ref 4.0–10.5)
nRBC: 0 % (ref 0.0–0.2)

## 2023-01-16 MED ORDER — DRONABINOL 2.5 MG PO CAPS: 2.5000 mg | ORAL_CAPSULE | Freq: Two times a day (BID) | ORAL | 0 refills | Status: AC

## 2023-01-16 MED ORDER — ALPRAZOLAM 0.25 MG PO TABS: 0.2500 mg | ORAL_TABLET | Freq: Every evening | ORAL | 0 refills | Status: DC | PRN

## 2023-01-16 MED ORDER — OXYCODONE HCL 5 MG PO TABS: 5.0000 mg | ORAL_TABLET | ORAL | 0 refills | Status: AC | PRN

## 2023-01-16 NOTE — Progress Notes (Signed)
Palliative Medicine [facility] at [facility] Telephone:([phone]) [phone] Fax:(336) [phone]   Name: Melvin Gilmore Date: 01/16/2023 MRN: [id number]  DOB: 1941-02-17  Patient Care Team: [name], MD as PCP - General (Internal Medicine)    REASON FOR CONSULTATION: Melvin Gilmore is a 82 y.o. male with multiple medical problems including history of prostate cancer status post HIFU, now with multiple myeloma.  MRI thoracic and lumbar spine showed a subacute compression fracture of T12 and focal enhancing marrow replacing lesions in T2, T5, T6, T8, and L1.  Palliative Care consulted to address goals and manage ongoing symptoms.  SOCIAL HISTORY:     reports that he has never smoked. He has never used smokeless tobacco. He reports current alcohol use of about 7.0 standard drinks of alcohol per week. He reports that he does not use drugs.  Patient is married lives home with his wife.  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   Prostate cancer (HCC) 2023   Seasonal allergies    Skin cancer    a.) ears   Wears partial dentures    lower    PAST SURGICAL HISTORY:  Past Surgical History:  Procedure Laterality Date   APPENDECTOMY     CATARACT EXTRACTION W/PHACO Right 01/15/2016   Procedure: CATARACT EXTRACTION PHACO AND INTRAOCULAR LENS PLACEMENT (IOC);  Surgeon: Sherald Hess, MD;  Location: [facility];  Service: Ophthalmology;   Laterality: Right;  RIGHT CALL CELL PHONE WITH TIME   CHOLECYSTECTOMY     COLONOSCOPY     COLONOSCOPY WITH ESOPHAGOGASTRODUODENOSCOPY (EGD)     ESOPHAGOGASTRODUODENOSCOPY  07/2021   ESOPHAGOGASTRODUODENOSCOPY (EGD) WITH PROPOFOL N/A 09/23/2017   Procedure: ESOPHAGOGASTRODUODENOSCOPY (EGD) WITH PROPOFOL;  Surgeon: Toledo, Boykin Nearing, MD;  Location: [facility];  Service: Gastroenterology;  Laterality: N/A;   HIGH INTENSITY FOCUSED ULTRASOUND (HIFU) OF THE PROSTATE N/A 02/07/2022   Procedure: HIGH INTENSITY FOCUSED ULTRASOUND (HIFU) OF THE PROSTATE;  Surgeon: Orson Ape, MD;  Location: [facility];  Service: Urology;  Laterality: N/A;   HYDROCELE EXCISION / REPAIR     IR KYPHO THORACIC WITH BONE BIOPSY  12/06/2022   IR RADIOLOGIST EVAL & MGMT  11/21/2022   LASIX RT EYE     PROSTATE BIOPSY N/A 01/03/2022   Procedure: PROSTATE BIOPSY  Addison Schliep;  Surgeon: Orson Ape, MD;  Location: [facility];  Service: Urology;  Laterality: N/A;   ROTATOR CUFF REPAIR Left 2001    HEMATOLOGY/ONCOLOGY HISTORY:  Oncology History  Multiple myeloma (HCC)  12/05/2022 Cancer Staging   Staging form: Plasma Cell Myeloma and Plasma Cell Disorders, AJCC 8th Edition - Clinical stage from 12/05/2022: RISS Stage II (Beta-2-microglobulin (mg/L): 3.7, Albumin (g/dL): 3.6, ISS: Stage II, High-risk cytogenetics: Absent, LDH: Normal) - Signed by [name], MD on 12/26/2022 Stage prefix: Initial diagnosis Beta 2 microglobulin range (mg/L): 3.5 to 5.49 Albumin  range (g/dL): Greater than or equal to 3.5 Cytogenetics: 1q addition, Other mutation   12/13/2022 Initial Diagnosis   Multiple myeloma (HCC)   12/26/2022 -  Chemotherapy   Patient is on Treatment Plan : MYELOMA NEWLY DIAGNOSED Daratumumab IV + Lenalidomide + Dexamethasone Weekly (DaraRd) q28d       ALLERGIES:  is allergic to amoxicillin-pot clavulanate and meloxicam.  MEDICATIONS:  Current Outpatient Medications  Medication Sig Dispense Refill    acetaminophen (TYLENOL) 500 MG tablet Take 500 mg by mouth every 6 (six) hours as needed.     acyclovir (ZOVIRAX) 400 MG tablet Take 400 mg by mouth 2 (two) times daily.     ALPRAZolam (XANAX) 0.25 MG tablet Take 1 tablet (0.25 mg total) by mouth at bedtime as needed for anxiety. 30 tablet 0   ALPRAZolam (XANAX) 0.5 MG tablet Take 0.25-0.5 mg by mouth at bedtime as needed for anxiety.     aspirin EC 81 MG tablet Take 1 tablet (81 mg total) by mouth daily. Swallow whole. 90 tablet 2   cetirizine (ZYRTEC) 10 MG tablet Take 10 mg by mouth as needed for allergies.     docusate sodium (COLACE) 100 MG capsule Take 2 capsules (200 mg total) by mouth 2 (two) times daily. 120 capsule 3   dronabinol (MARINOL) 2.5 MG capsule Take 1 capsule (2.5 mg total) by mouth 2 (two) times daily before a meal. 60 capsule 0   fluticasone (FLONASE) 50 MCG/ACT nasal spray Place 1 spray into both nostrils daily.     gabapentin (NEURONTIN) 100 MG capsule Take 1 capsule (100 mg total) by mouth 3 (three) times daily. 30 capsule 0   lactulose (CHRONULAC) 10 GM/15ML solution Take 10-20 GM (15-30 ml) one - two times a day as needed for constipation 475 mL 0   lenalidomide (REVLIMID) 10 MG capsule Take 1 capsule (10 mg total) by mouth daily. Take for 21 days, then hold for 7 days. Repeat every 28 days. 21 capsule 0   levothyroxine (SYNTHROID, LEVOTHROID) 50 MCG tablet Take 50 mcg by mouth daily before breakfast.     morphine (MS CONTIN) 15 MG 12 hr tablet Take 1 tablet (15 mg total) by mouth every 12 (twelve) hours. (Patient not taking: Reported on 01/16/2023) 30 tablet 0   naloxone (NARCAN) nasal spray 4 mg/0.1 mL SPRAY 1 SPRAY INTO ONE NOSTRIL AS DIRECTED FOR OPIOID OVERDOSE (TURN PERSON ON SIDE AFTER DOSE. IF NO RESPONSE IN 2-3 MINUTES OR PERSON RESPONDS BUT RELAPSES, REPEAT USING A NEW SPRAY DEVICE AND SPRAY INTO THE OTHER NOSTRIL. CALL 911 AFTER USE.) * EMERGENCY USE ONLY * 1 each 0   omeprazole (PRILOSEC) 40 MG capsule Take 40 mg  by mouth every morning.     ondansetron (ZOFRAN) 8 MG tablet Take 1 tablet (8 mg total) by mouth every 8 (eight) hours as needed for nausea or vomiting. 30 tablet 1   oxyCODONE (OXY IR/ROXICODONE) 5 MG immediate release tablet Take 1-2 tablets (5-10 mg total) by mouth every 4 (four) hours as needed for severe pain. 120 tablet 0   Plant Sterols and Stanols (CHOLESTOFF) 450 MG TABS Take 1 tablet by mouth every morning.     predniSONE (DELTASONE) 10 MG tablet Take 1 tablet (10mg ) daily x 1 week, then take 5mg  (1/2 tablet) daily x 1 week, then stop. 15 tablet 0   Probiotic Product (PROBIOTIC ADVANCED PO) Take 1 tablet by mouth at bedtime.     tadalafil (CIALIS) 5 MG tablet Take 5 mg  by mouth daily as needed for erectile dysfunction. (Patient not taking: Reported on 12/18/2022)     No current facility-administered medications for this visit.    VITAL SIGNS: There were no vitals taken for this visit. There were no vitals filed for this visit.  Estimated body mass index is 19.69 kg/m as calculated from the following:   Height as of 12/05/22: 5\' 11"  (1.803 m).   Weight as of an earlier encounter on 01/16/23: 141 lb 3.2 oz (64 kg).  LABS: CBC:    Component Value Date/Time   WBC 1.3 (L) 01/16/2023 0934   WBC 1.0 (L) 01/13/2023 1350   HGB 10.8 (L) 01/16/2023 0934   HCT 30.5 (L) 01/16/2023 0934   PLT 127 (L) 01/16/2023 0934   MCV 99.0 01/16/2023 0934   NEUTROABS 0.4 (LL) 01/16/2023 0934   LYMPHSABS 0.5 (L) 01/16/2023 0934   MONOABS 0.2 01/16/2023 0934   EOSABS 0.0 01/16/2023 0934   BASOSABS 0.0 01/16/2023 0934   Comprehensive Metabolic Panel:    Component Value Date/Time   NA 129 (L) 01/16/2023 0934   K 4.0 01/16/2023 0934   CL 97 (L) 01/16/2023 0934   CO2 24 01/16/2023 0934   BUN 12 01/16/2023 0934   CREATININE 0.93 01/16/2023 0934   GLUCOSE 99 01/16/2023 0934   CALCIUM 8.5 (L) 01/16/2023 0934   AST 18 01/16/2023 0934   ALT 17 01/16/2023 0934   ALKPHOS 124 01/16/2023 0934   BILITOT  0.5 01/16/2023 0934   PROT 6.6 01/16/2023 0934   ALBUMIN 3.5 01/16/2023 0934    RADIOGRAPHIC STUDIES: NM PET Image Initial (PI) Whole Body  Result Date: 12/25/2022 CLINICAL DATA:  Initial treatment strategy for multiple myeloma. EXAM: NUCLEAR MEDICINE PET WHOLE BODY TECHNIQUE: 8.6 mCi F-18 FDG was injected intravenously. Full-ring PET imaging was performed from the head to foot after the radiotracer. CT data was obtained and used for attenuation correction and anatomic localization. Fasting blood glucose: 102 mg/dl COMPARISON:  MR lumbar spine 11/28/2022 FINDINGS: Mediastinal blood pool activity: SUV max 2.1 HEAD/NECK: No hypermetabolic activity in the scalp. No hypermetabolic cervical lymph nodes. Incidental CT findings: none CHEST: No hypermetabolic mediastinal or hilar nodes. No suspicious pulmonary nodules on the CT scan. Incidental CT findings: Chronic interstitial thickening in the lungs. ABDOMEN/PELVIS: No abnormal hypermetabolic activity within the liver, pancreas, adrenal glands, or spleen. No hypermetabolic lymph nodes in the abdomen or pelvis. Incidental CT findings: none SKELETON: Focal radiotracer activity within the L1 vertebral body with SUV max equal 5.1 (image 209). This activity is 1 vertebral body level lower than the vertebroplasty for compression fracture at T12. Mild radiotracer activity associated with the superior endplate of the L 3 vertebral body with SUV max equal 3.8. Metabolic activity associated the posterior LEFT eighth rib with SUV max equal 3.9 on image 77. No clear CT changes. Focal uptake in the medial aspect of the RIGHT eighth rib at the costovertebral junction. Favor degenerative uptake. Incidental CT findings: No sclerotic or lytic lesions on CT imaging. EXTREMITIES: No abnormal hypermetabolic activity in the lower extremities. Incidental CT findings: none IMPRESSION: 1. Focal uptake in the L1 vertebral body concern for multiple myeloma however a traumatic compression  fractures also in the differential. Augmentation of the vertebral body one level above. 2. Uptake in the posterior LEFT eighth rib is concerning for myeloma. 3. Two smaller lesions are indeterminate for degenerative change versus myeloma. 4. No on typical lytic lesions on the CT portion exam. Electronically Signed   By: Roseanne Reno  Amil Amen M.D.   On: 12/25/2022 13:20    PERFORMANCE STATUS (ECOG) : 1 - Symptomatic but completely ambulatory  Review of Systems Unless otherwise noted, a complete review of systems is negative.  Physical Exam General: NAD Cardiovascular: regular rate and rhythm Pulmonary: clear ant fields Abdomen: soft, nontender, + bowel sounds GU: no suprapubic tenderness Extremities: no edema, no joint deformities Skin: no rashes Neurological: Weakness but otherwise nonfocal  IMPRESSION: Follow-up visit.    Patient reports that he is doing fair today.  Denies any significant changes or concerns.  He reports overall improvement in pain.  He did not start his MS Contin and has averaged a single dose of oxycodone per day.  Treatment was held today due to neutropenia.  Patient is concerned about hiccups once he begins treatment again.    Patient's appetite is reportedly poor so we discussed importance of high-calorie/high-protein foods and maximizing oral nutritional supplements.  Dr. Alena Bills plans to start patient on appetite stimulant.  Patient does have some weakness and fatigue.  Will refer to [facility].  PLAN: -Continue current scope of treatment -Continue oxycodone as needed for breakthrough pain -Discontinue MS Contin -Continue gabapentin -Daily bowel regimen -Telephone visit 1 month  Case and plan discussed with Dr. Alena Bills  Patient expressed understanding and was in agreement with this plan. He also understands that He can call the clinic at any time with any questions, concerns, or complaints.     Time Total: 15 minutes  Visit consisted of counseling and  education dealing with the complex and emotionally intense issues of symptom management and palliative care in the setting of serious and potentially life-threatening illness.Greater than 50%  of this time was spent counseling and coordinating care related to the above assessment and plan.  Signed by: Laurette Schimke, PhD, NP-C

## 2023-01-16 NOTE — Progress Notes (Signed)
[facility] CONSULT NOTE  Patient Care Team: [name], MD as PCP - General (Internal Medicine)   CANCER STAGING   Cancer Staging  Multiple myeloma [facility]) Staging form: Plasma Cell Myeloma and Plasma Cell Disorders, AJCC 8th Edition - Clinical stage from 12/05/2022: RISS Stage II (Beta-2-microglobulin (mg/L): 3.7, Albumin (g/dL): 3.6, ISS: Stage II, High-risk cytogenetics: Absent, LDH: Normal) - Signed by Melvin Barter, MD on 12/26/2022 Stage prefix: Initial diagnosis Beta 2 microglobulin range (mg/L): 3.5 to 5.49 Albumin range (g/dL): Greater than or equal to 3.5 Cytogenetics: 1q addition, Other mutation   ASSESSMENT & PLAN:  Melvin Gil[facility] 82 y.o. male with pmh of hypertension, hyperlipidemia, anxiety, GERD, BPH, hypothyroidism, prostate cancer s/p HIFU was seen by primary on November 13, 2022 for acute onset of lower back pain.  Was referred to medical oncology for finding of L1 lesion suspicious for metastasis.  # Multiple myeloma, RISS Stage II -MRI thoracic and lumbar spine with and without contrast was reviewed. Showed unchanged subacute compression fracture at T12.  Focal enhancing marrow replacing lesions in T6, T8, T5, T2 all suspicious for metastatic disease.  L1 lesion measuring 16 mm.  Multilevel degenerative changes present.  -SPEP/IFE showed biclonal IgA kappa paraprotein with M spike 2.1 g/dL and second spike at 0.4 g/dL.  Kappa 453, lambda 6.8 with a ratio of 66.7.  Iron panel and B12 are normal.  PSA 3.44.  CBC showed mild anemia 13.4, chronic.  On CMP normal creatinine and calcium levels.  Elevated total protein 8.8.  LDH normal.  Beta-2 microglobulin 3.7.  Albumin 3.6.  24-hour urine/UPEP showed 1.1 g protein with no M spike.  -BMBx Hypercellular marrow with 60% plasma cells.  IHC positive for CD138/ mum 1.  Kappa restricted consistent with multiple myeloma. L1 vertebral body biopsy showed numerous plasma cells. Cytogenetics - del (13q) and gain 1.     -PET/CT showed activity in L1 and left eighth rib concerning for myeloma.  -Started on Dara-RD on 12/26/2022.  He is neutropenic from Revlimid.  Revlimid 10 mg held on 01/13/2023.  WBC today 1.3, ANC 400.  Will hold treatment today.  He will have repeat labs on Monday.  If his neutropenia last for more than a week I will consider starting prophylactic antibiotic.  Follow-up next Friday for labs and possible treatment.  # Intractable hiccups -Hiccups start after treatment and last for about 12 to 24 hours.  Started with second cycle -On side effect check, there is mention of hiccups with dexamethasone frequency not defined.  With the first cycle he received IV Dex which was different from the cycle cycle when he received p.o. Dex.  With the next cycle I will try IV Dex again and see if it helps.  # T12 pathological fracture # Cancer-related pain - s/p kyphoplasty by Dr. Elijio Miles on 12/06/22.  Initially had improvement but now pain is worse. -Vicodin did not work.  On short acting oxycodone 5 mg every 4 hours as needed.  Following with palliative.  -Getting palliative XRT for pain control  # Opioid-induced constipation -Manageable with senna, MiraLAX, lactulose, Movantik as needed.  # History of prostate cancer - follows with Dr. Evelene Croon, urology for elevated PSA.  MRI prostate showed 1 mm category 5 lesion of anterior transition zone.  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 s/p HIFU of the prostate on 02/07/2022.  PSA level from 10/08/2022 was 3.34.  -PSA from 11/21/2022 3.44.  #  Normocytic anemia -Chronic, worsening on Revlimid -Iron panel, B12 and folate normal.  # Poor appetite # Weight loss -Could be from cancer and uncontrolled pain.  Pain management as above. -Nutrition following. -Will start on Marinol 2.5 mg twice daily for appetite stimulant.  # Anxiety -On longstanding Xanax 0.25 mg at night as needed.  Requesting for  refill. -Anxiety has been heightened with the recent diagnosis, pain, poor appetite.  Will send for refill.  Orders Placed This Encounter  Procedures   CBC with Differential (Cancer [facility] Only)    Standing Status:   Future    Standing Expiration Date:   01/16/2024   RTC in 1 week for MD visit, labs, cycle 1- day 22 of daratumumab  The total time spent in the appointment was 30 minutes encounter with patients including review of chart and various tests results, discussions about plan of care and coordination of care plan   All questions were answered. The patient knows to call the clinic with any problems, questions or concerns. No barriers to learning was detected.  Melvin Barter, MD 6/27/20243:01 PM   HISTORY OF PRESENTING ILLNESS:  Melvin Gilmore 82 y.o. male with past medical history of hypertension, hyperlipidemia, anxiety, GERD, BPH, hypothyroidism, prostate cancer s/p HIFU was seen by primary on November 13, 2022 for acute onset of lower back pain.  Patient reports that he was sitting outside and had violent sneeze which was followed by severe back pain.  He has fallen allergies.  Had x-rays followed by MRI thoracic spine without contrast.  It showed compression fracture of T12 superior endplate with surrounding marrow edema likely subacute.  15 mm T1 hypointense marrow lesion in the anterior aspect of L1 suspicious for metastasis.  Further characterization with postcontrast MRI recommended.  Prostate cancer-follows with Dr. Evelene Croon, urology for elevated PSA.  MRI prostate showed 1 mm category 5 lesion of anterior transition zone.  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 s/p HIFU of the prostate on 02/07/2022.  PSA level from 10/08/2022 was 3.34.  Interval history Patient seen today accompanied with his wife to prior to cycle 1 day 22 of daratumumab.  Patient has stopped Revlimid on Monday.  His blood counts are down.  He is  recording temperature twice a day.  No fevers.  Advised about neutropenic precautions.  Patient and his wife thinks that he is making some progress in regards to his back pain.  His pain is better controlled and has been more mobile.  He did not start long-acting morphine.  Tells me his pain is doing okay with as needed oxycodone.  Energy level is fair.  Appetite is poor.  I have reviewed his chart and materials related to his cancer extensively and collaborated history with the patient. Summary of oncologic history is as follows: Oncology History  Multiple myeloma (HCC)  12/05/2022 Cancer Staging   Staging form: Plasma Cell Myeloma and Plasma Cell Disorders, AJCC 8th Edition - Clinical stage from 12/05/2022: RISS Stage II (Beta-2-microglobulin (mg/L): 3.7, Albumin (g/dL): 3.6, ISS: Stage II, High-risk cytogenetics: Absent, LDH: Normal) - Signed by Melvin Barter, MD on 12/26/2022 Stage prefix: Initial diagnosis Beta 2 microglobulin range (mg/L): 3.5 to 5.49 Albumin range (g/dL): Greater than or equal to 3.5 Cytogenetics: 1q addition, Other mutation   12/13/2022 Initial Diagnosis   Multiple myeloma (HCC)   12/26/2022 -  Chemotherapy   Patient is on Treatment Plan : MYELOMA NEWLY DIAGNOSED Daratumumab IV + Lenalidomide + Dexamethasone Weekly (  DaraRd) q28d       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   Prostate cancer (HCC) 2023   Seasonal allergies    Skin cancer    a.) ears   Wears partial dentures    lower    SURGICAL HISTORY: Past  Surgical History:  Procedure Laterality Date   APPENDECTOMY     CATARACT EXTRACTION W/PHACO Right 01/15/2016   Procedure: CATARACT EXTRACTION PHACO AND INTRAOCULAR LENS PLACEMENT (IOC);  Surgeon: Sherald Hess, MD;  Location: [facility];  Service: Ophthalmology;  Laterality: Right;  RIGHT CALL CELL PHONE WITH TIME   CHOLECYSTECTOMY     COLONOSCOPY     COLONOSCOPY WITH ESOPHAGOGASTRODUODENOSCOPY (EGD)     ESOPHAGOGASTRODUODENOSCOPY  07/2021   ESOPHAGOGASTRODUODENOSCOPY (EGD) WITH PROPOFOL N/A 09/23/2017   Procedure: ESOPHAGOGASTRODUODENOSCOPY (EGD) WITH PROPOFOL;  Surgeon: Toledo, Boykin Nearing, MD;  Location: [facility];  Service: Gastroenterology;  Laterality: N/A;   HIGH INTENSITY FOCUSED ULTRASOUND (HIFU) OF THE PROSTATE N/A 02/07/2022   Procedure: HIGH INTENSITY FOCUSED ULTRASOUND (HIFU) OF THE PROSTATE;  Surgeon: Orson Ape, MD;  Location: [facility];  Service: Urology;  Laterality: N/A;   HYDROCELE EXCISION / REPAIR     IR KYPHO THORACIC WITH BONE BIOPSY  12/06/2022   IR RADIOLOGIST EVAL & MGMT  11/21/2022   LASIX RT EYE     PROSTATE BIOPSY N/A 01/03/2022   Procedure: PROSTATE BIOPSY  Addison Vita;  Surgeon: Orson Ape, MD;  Location: [facility];  Service: Urology;  Laterality: N/A;   ROTATOR CUFF REPAIR Left 2001    SOCIAL HISTORY: Social History   Socioeconomic History   Marital status: Married    Spouse name: Not on file   Number of children: Not on file   Years of education: Not on file   Highest education level: Not on file  Occupational History   Not on file  Tobacco Use   Smoking status: Never   Smokeless tobacco: Never  Vaping Use   Vaping Use: Never used  Substance and Sexual Activity   Alcohol use: Yes    Alcohol/week: 7.0 standard drinks of alcohol    Types: 7 Glasses of wine per week    Comment: wine occ   Drug use: No   Sexual activity: Not on file  Other Topics Concern   Not on file  Social History Narrative   Not on file    Social Determinants of Health   Financial Resource Strain: Low Risk  (11/21/2022)   Overall Financial Resource Strain (CARDIA)    Difficulty of Paying Living Expenses: Not hard at all  Food Insecurity: No Food Insecurity (11/21/2022)   Hunger Vital Sign    Worried About Running Out of Food in the Last Year: Never true    Ran Out of Food in the Last Year: Never true  Transportation Needs: Not on file  Physical Activity: Not on file  Stress: Not on file  Social  Connections: Not on file  Intimate Partner Violence: Not At Risk (11/21/2022)   Humiliation, Afraid, Rape, and Kick questionnaire    Fear of Current or Ex-Partner: No    Emotionally Abused: No    Physically Abused: No    Sexually Abused: No    FAMILY HISTORY: Family History  Problem Relation Age of Onset   Pneumonia Father     ALLERGIES:  is allergic to amoxicillin-pot clavulanate and meloxicam.  MEDICATIONS:  Current Outpatient Medications  Medication Sig Dispense Refill   acetaminophen (TYLENOL) 500 MG tablet Take 500 mg by mouth every 6 (six) hours as needed.     acyclovir (ZOVIRAX) 400 MG tablet Take 400 mg by mouth 2 (two) times daily.     ALPRAZolam (XANAX) 0.25 MG tablet Take 1 tablet (0.25 mg total) by mouth at bedtime as needed for anxiety. 30 tablet 0   ALPRAZolam (XANAX) 0.5 MG tablet Take 0.25-0.5 mg by mouth at bedtime as needed for anxiety.     aspirin EC 81 MG tablet Take 1 tablet (81 mg total) by mouth daily. Swallow whole. 90 tablet 2   cetirizine (ZYRTEC) 10 MG tablet Take 10 mg by mouth as needed for allergies.     docusate sodium (COLACE) 100 MG capsule Take 2 capsules (200 mg total) by mouth 2 (two) times daily. 120 capsule 3   dronabinol (MARINOL) 2.5 MG capsule Take 1 capsule (2.5 mg total) by mouth 2 (two) times daily before a meal. 60 capsule 0   fluticasone (FLONASE) 50 MCG/ACT nasal spray Place 1 spray into both nostrils daily.     gabapentin (NEURONTIN) 100 MG capsule Take 1 capsule (100 mg  total) by mouth 3 (three) times daily. 30 capsule 0   lactulose (CHRONULAC) 10 GM/15ML solution Take 10-20 GM (15-30 ml) one - two times a day as needed for constipation 475 mL 0   lenalidomide (REVLIMID) 10 MG capsule Take 1 capsule (10 mg total) by mouth daily. Take for 21 days, then hold for 7 days. Repeat every 28 days. 21 capsule 0   levothyroxine (SYNTHROID, LEVOTHROID) 50 MCG tablet Take 50 mcg by mouth daily before breakfast.     naloxone (NARCAN) nasal spray 4 mg/0.1 mL SPRAY 1 SPRAY INTO ONE NOSTRIL AS DIRECTED FOR OPIOID OVERDOSE (TURN PERSON ON SIDE AFTER DOSE. IF NO RESPONSE IN 2-3 MINUTES OR PERSON RESPONDS BUT RELAPSES, REPEAT USING A NEW SPRAY DEVICE AND SPRAY INTO THE OTHER NOSTRIL. CALL 911 AFTER USE.) * EMERGENCY USE ONLY * 1 each 0   omeprazole (PRILOSEC) 40 MG capsule Take 40 mg by mouth every morning.     ondansetron (ZOFRAN) 8 MG tablet Take 1 tablet (8 mg total) by mouth every 8 (eight) hours as needed for nausea or vomiting. 30 tablet 1   Plant Sterols and Stanols (CHOLESTOFF) 450 MG TABS Take 1 tablet by mouth every morning.     predniSONE (DELTASONE) 10 MG tablet Take 1 tablet (10mg ) daily x 1 week, then take 5mg  (1/2 tablet) daily x 1 week, then stop. 15 tablet 0   Probiotic Product (PROBIOTIC ADVANCED PO) Take 1 tablet by mouth at bedtime.     oxyCODONE (OXY IR/ROXICODONE) 5 MG immediate release tablet Take 1-2 tablets (5-10 mg total) by mouth every 4 (four) hours as needed for severe pain. 120 tablet 0   tadalafil (CIALIS) 5 MG tablet Take 5 mg by mouth daily as needed for erectile dysfunction. (Patient not taking: Reported on 12/18/2022)     No  current facility-administered medications for this visit.    REVIEW OF SYSTEMS:   Pertinent information mentioned in HPI All other systems were reviewed with the patient and are negative.  PHYSICAL EXAMINATION: ECOG PERFORMANCE STATUS: 1 - Symptomatic but completely ambulatory  Vitals:   01/16/23 1001  BP: 129/64  Pulse:  61  Temp: 98.1 F (36.7 C)  SpO2: 100%    Filed Weights   01/16/23 1001  Weight: 141 lb 3.2 oz (64 kg)     GENERAL:alert, no distress and comfortable SKIN: skin color, texture, turgor are normal, no rashes or significant lesions EYES: normal, conjunctiva are pink and non-injected, sclera clear OROPHARYNX:no exudate, no erythema and lips, buccal mucosa, and tongue normal  NECK: supple, thyroid normal size, non-tender, without nodularity LYMPH:  no palpable lymphadenopathy in the cervical, axillary or inguinal LUNGS: clear to auscultation and percussion with normal breathing effort HEART: regular rate & rhythm and no murmurs and no lower extremity edema ABDOMEN:abdomen soft, non-tender and normal bowel sounds Musculoskeletal:no cyanosis of digits and no clubbing  PSYCH: alert & oriented x 3 with fluent speech NEURO: no focal motor/sensory deficits  LABORATORY DATA:  I have reviewed the data as listed Lab Results  Component Value Date   WBC 1.3 (L) 01/16/2023   HGB 10.8 (L) 01/16/2023   HCT 30.5 (L) 01/16/2023   MCV 99.0 01/16/2023   PLT 127 (L) 01/16/2023   Recent Labs    12/13/22 1454 01/13/23 1350 01/16/23 0934  NA 132* 129* 129*  K 4.8 3.8 4.0  CL 93* 97* 97*  CO2 27 25 24   GLUCOSE 104* 96 99  BUN 20 21 12   CREATININE 1.04 0.81 0.93  CALCIUM 9.5 8.1* 8.5*  GFRNONAA >60 >60 >60  PROT 9.2* 6.3* 6.6  ALBUMIN 3.6 3.2* 3.5  AST 23 16 18   ALT 17 16 17   ALKPHOS 73 113 124  BILITOT 0.5 0.8 0.5    RADIOGRAPHIC STUDIES: I have personally reviewed the radiological images as listed and agreed with the findings in the report. NM PET Image Initial (PI) Whole Body  Result Date: 12/25/2022 CLINICAL DATA:  Initial treatment strategy for multiple myeloma. EXAM: NUCLEAR MEDICINE PET WHOLE BODY TECHNIQUE: 8.6 mCi F-18 FDG was injected intravenously. Full-ring PET imaging was performed from the head to foot after the radiotracer. CT data was obtained and used for attenuation  correction and anatomic localization. Fasting blood glucose: 102 mg/dl COMPARISON:  MR lumbar spine 11/28/2022 FINDINGS: Mediastinal blood pool activity: SUV max 2.1 HEAD/NECK: No hypermetabolic activity in the scalp. No hypermetabolic cervical lymph nodes. Incidental CT findings: none CHEST: No hypermetabolic mediastinal or hilar nodes. No suspicious pulmonary nodules on the CT scan. Incidental CT findings: Chronic interstitial thickening in the lungs. ABDOMEN/PELVIS: No abnormal hypermetabolic activity within the liver, pancreas, adrenal glands, or spleen. No hypermetabolic lymph nodes in the abdomen or pelvis. Incidental CT findings: none SKELETON: Focal radiotracer activity within the L1 vertebral body with SUV max equal 5.1 (image 209). This activity is 1 vertebral body level lower than the vertebroplasty for compression fracture at T12. Mild radiotracer activity associated with the superior endplate of the L 3 vertebral body with SUV max equal 3.8. Metabolic activity associated the posterior LEFT eighth rib with SUV max equal 3.9 on image 77. No clear CT changes. Focal uptake in the medial aspect of the RIGHT eighth rib at the costovertebral junction. Favor degenerative uptake. Incidental CT findings: No sclerotic or lytic lesions on CT imaging. EXTREMITIES: No abnormal hypermetabolic activity  in the lower extremities. Incidental CT findings: none IMPRESSION: 1. Focal uptake in the L1 vertebral body concern for multiple myeloma however a traumatic compression fractures also in the differential. Augmentation of the vertebral body one level above. 2. Uptake in the posterior LEFT eighth rib is concerning for myeloma. 3. Two smaller lesions are indeterminate for degenerative change versus myeloma. 4. No on typical lytic lesions on the CT portion exam. Electronically Signed   By: Genevive Bi M.D.   On: 12/25/2022 13:20

## 2023-01-16 NOTE — Telephone Encounter (Signed)
[facility] from [facility] lab verbally notified RN of abnormal lab value ANC 0.4.  Dr Alena Bills notified per RN.

## 2023-01-16 NOTE — Progress Notes (Signed)
Patient is having more hiccups after his treatment, so he is really dreading today. He is having no appetite. Also after eating his right side of his stomach feels like it is full of gas and is causing him some pain.

## 2023-01-17 ENCOUNTER — Ambulatory Visit
Admission: RE | Admit: 2023-01-17 | Discharge: 2023-01-17 | Disposition: A | Discharge status: Home / Self Care | Payer: Medicare HMO | Source: Ambulatory Visit | Attending: Radiation Oncology | Admitting: Radiation Oncology

## 2023-01-17 ENCOUNTER — Other Ambulatory Visit: Payer: Self-pay

## 2023-01-17 DIAGNOSIS — Z510 Encounter for antineoplastic radiation therapy: Secondary | ICD-10-CM | POA: Diagnosis not present

## 2023-01-17 LAB — RAD ONC ARIA SESSION SUMMARY
Course Elapsed Days: 10
Plan Fractions Treated to Date: 9
Plan Prescribed Dose Per Fraction: 2.5 Gy
Plan Total Fractions Prescribed: 10
Plan Total Prescribed Dose: 25 Gy
Reference Point Dosage Given to Date: 22.5 Gy
Reference Point Session Dosage Given: 2.5 Gy
Session Number: 9

## 2023-01-20 ENCOUNTER — Other Ambulatory Visit: Payer: Self-pay

## 2023-01-20 ENCOUNTER — Ambulatory Visit
Admission: RE | Admit: 2023-01-20 | Discharge: 2023-01-20 | Disposition: A | Discharge status: Home / Self Care | Payer: Medicare HMO | Source: Ambulatory Visit | Attending: Radiation Oncology | Admitting: Radiation Oncology

## 2023-01-20 ENCOUNTER — Inpatient Hospital Stay: Payer: Medicare HMO | Attending: Internal Medicine

## 2023-01-20 DIAGNOSIS — G893 Neoplasm related pain (acute) (chronic): Secondary | ICD-10-CM | POA: Diagnosis not present

## 2023-01-20 DIAGNOSIS — K5903 Drug induced constipation: Secondary | ICD-10-CM | POA: Insufficient documentation

## 2023-01-20 DIAGNOSIS — R338 Other retention of urine: Secondary | ICD-10-CM | POA: Diagnosis not present

## 2023-01-20 DIAGNOSIS — C9000 Multiple myeloma not having achieved remission: Secondary | ICD-10-CM | POA: Diagnosis present

## 2023-01-20 DIAGNOSIS — F419 Anxiety disorder, unspecified: Secondary | ICD-10-CM | POA: Diagnosis not present

## 2023-01-20 DIAGNOSIS — K219 Gastro-esophageal reflux disease without esophagitis: Secondary | ICD-10-CM | POA: Insufficient documentation

## 2023-01-20 DIAGNOSIS — Z5112 Encounter for antineoplastic immunotherapy: Secondary | ICD-10-CM | POA: Insufficient documentation

## 2023-01-20 DIAGNOSIS — N401 Enlarged prostate with lower urinary tract symptoms: Secondary | ICD-10-CM | POA: Insufficient documentation

## 2023-01-20 DIAGNOSIS — E785 Hyperlipidemia, unspecified: Secondary | ICD-10-CM | POA: Diagnosis not present

## 2023-01-20 DIAGNOSIS — R630 Anorexia: Secondary | ICD-10-CM | POA: Diagnosis not present

## 2023-01-20 DIAGNOSIS — D649 Anemia, unspecified: Secondary | ICD-10-CM | POA: Diagnosis not present

## 2023-01-20 DIAGNOSIS — I10 Essential (primary) hypertension: Secondary | ICD-10-CM | POA: Insufficient documentation

## 2023-01-20 DIAGNOSIS — C61 Malignant neoplasm of prostate: Secondary | ICD-10-CM | POA: Insufficient documentation

## 2023-01-20 DIAGNOSIS — Z510 Encounter for antineoplastic radiation therapy: Secondary | ICD-10-CM | POA: Insufficient documentation

## 2023-01-20 DIAGNOSIS — R066 Hiccough: Secondary | ICD-10-CM | POA: Insufficient documentation

## 2023-01-20 DIAGNOSIS — E039 Hypothyroidism, unspecified: Secondary | ICD-10-CM | POA: Diagnosis not present

## 2023-01-20 LAB — RAD ONC ARIA SESSION SUMMARY
Course Elapsed Days: 13
Plan Fractions Treated to Date: 10
Plan Prescribed Dose Per Fraction: 2.5 Gy
Plan Total Fractions Prescribed: 10
Plan Total Prescribed Dose: 25 Gy
Reference Point Dosage Given to Date: 25 Gy
Reference Point Session Dosage Given: 2.5 Gy
Session Number: 10

## 2023-01-20 LAB — CBC WITH DIFFERENTIAL (CANCER CENTER ONLY)
Abs Immature Granulocytes: 0 10*3/uL (ref 0.00–0.07)
Basophils Absolute: 0 10*3/uL (ref 0.0–0.1)
Basophils Relative: 1 %
Eosinophils Absolute: 0 10*3/uL (ref 0.0–0.5)
Eosinophils Relative: 1 %
HCT: 29.5 % — ABNORMAL LOW (ref 39.0–52.0)
Hemoglobin: 10.7 g/dL — ABNORMAL LOW (ref 13.0–17.0)
Immature Granulocytes: 0 %
Lymphocytes Relative: 35 %
Lymphs Abs: 0.6 10*3/uL — ABNORMAL LOW (ref 0.7–4.0)
MCH: 35.9 pg — ABNORMAL HIGH (ref 26.0–34.0)
MCHC: 36.3 g/dL — ABNORMAL HIGH (ref 30.0–36.0)
MCV: 99 fL (ref 80.0–100.0)
Monocytes Absolute: 0.3 10*3/uL (ref 0.1–1.0)
Monocytes Relative: 17 %
Neutro Abs: 0.7 10*3/uL — ABNORMAL LOW (ref 1.7–7.7)
Neutrophils Relative %: 46 %
Platelet Count: 149 10*3/uL — ABNORMAL LOW (ref 150–400)
RBC: 2.98 MIL/uL — ABNORMAL LOW (ref 4.22–5.81)
RDW: 16.3 % — ABNORMAL HIGH (ref 11.5–15.5)
WBC Count: 1.6 10*3/uL — ABNORMAL LOW (ref 4.0–10.5)
nRBC: 0 % (ref 0.0–0.2)

## 2023-01-24 ENCOUNTER — Other Ambulatory Visit: Payer: Self-pay | Admitting: Pharmacist

## 2023-01-24 ENCOUNTER — Inpatient Hospital Stay: Payer: Medicare HMO

## 2023-01-24 ENCOUNTER — Ambulatory Visit: Payer: Medicare HMO

## 2023-01-24 ENCOUNTER — Ambulatory Visit: Payer: Medicare HMO | Admitting: Internal Medicine

## 2023-01-24 ENCOUNTER — Other Ambulatory Visit: Payer: Medicare HMO

## 2023-01-24 ENCOUNTER — Inpatient Hospital Stay: Payer: Medicare HMO | Admitting: Internal Medicine

## 2023-01-24 VITALS — BP 126/56 | HR 63 | Temp 98.5°F | Resp 16

## 2023-01-24 DIAGNOSIS — C9000 Multiple myeloma not having achieved remission: Secondary | ICD-10-CM | POA: Diagnosis not present

## 2023-01-24 DIAGNOSIS — Z5112 Encounter for antineoplastic immunotherapy: Secondary | ICD-10-CM | POA: Diagnosis not present

## 2023-01-24 LAB — CBC WITH DIFFERENTIAL (CANCER CENTER ONLY)
Abs Immature Granulocytes: 0.01 10*3/uL (ref 0.00–0.07)
Basophils Absolute: 0 10*3/uL (ref 0.0–0.1)
Basophils Relative: 1 %
Eosinophils Absolute: 0 10*3/uL (ref 0.0–0.5)
Eosinophils Relative: 2 %
HCT: 31.6 % — ABNORMAL LOW (ref 39.0–52.0)
Hemoglobin: 11.2 g/dL — ABNORMAL LOW (ref 13.0–17.0)
Immature Granulocytes: 1 %
Lymphocytes Relative: 30 %
Lymphs Abs: 0.6 10*3/uL — ABNORMAL LOW (ref 0.7–4.0)
MCH: 35 pg — ABNORMAL HIGH (ref 26.0–34.0)
MCHC: 35.4 g/dL (ref 30.0–36.0)
MCV: 98.8 fL (ref 80.0–100.0)
Monocytes Absolute: 0.3 10*3/uL (ref 0.1–1.0)
Monocytes Relative: 16 %
Neutro Abs: 1.1 10*3/uL — ABNORMAL LOW (ref 1.7–7.7)
Neutrophils Relative %: 50 %
Platelet Count: 173 10*3/uL (ref 150–400)
RBC: 3.2 MIL/uL — ABNORMAL LOW (ref 4.22–5.81)
RDW: 16.9 % — ABNORMAL HIGH (ref 11.5–15.5)
WBC Count: 2.1 10*3/uL — ABNORMAL LOW (ref 4.0–10.5)
nRBC: 0 % (ref 0.0–0.2)

## 2023-01-24 MED ORDER — DIPHENHYDRAMINE HCL 25 MG PO CAPS
50.0000 mg | ORAL_CAPSULE | Freq: Once | ORAL | Status: AC
  Administered 2023-01-24: 50 mg via ORAL
  Filled 2023-01-24: qty 2

## 2023-01-24 MED ORDER — SODIUM CHLORIDE 0.9 % IV SOLN
Freq: Once | INTRAVENOUS | Status: AC
  Filled 2023-01-24: qty 250

## 2023-01-24 MED ORDER — DARATUMUMAB-HYALURONIDASE-FIHJ 1800-30000 MG-UT/15ML ~~LOC~~ SOLN
1800.0000 mg | Freq: Once | SUBCUTANEOUS | Status: AC
  Administered 2023-01-24: 1800 mg via SUBCUTANEOUS
  Filled 2023-01-24: qty 15

## 2023-01-24 MED ORDER — ACETAMINOPHEN 325 MG PO TABS
650.0000 mg | ORAL_TABLET | Freq: Once | ORAL | Status: AC
  Administered 2023-01-24: 650 mg via ORAL
  Filled 2023-01-24: qty 2

## 2023-01-24 MED ORDER — SODIUM CHLORIDE 0.9 % IV SOLN
20.0000 mg | Freq: Once | INTRAVENOUS | Status: AC
  Administered 2023-01-24: 20 mg via INTRAVENOUS
  Filled 2023-01-24: qty 2

## 2023-01-24 MED ORDER — LENALIDOMIDE 5 MG PO CAPS
5.0000 mg | ORAL_CAPSULE | Freq: Every day | ORAL | 0 refills | Status: DC
Start: 2023-01-24 — End: 2023-01-28

## 2023-01-24 NOTE — Progress Notes (Signed)
Per Dr. Agrawal, no post injection monitoring needed. 

## 2023-01-24 NOTE — Patient Instructions (Signed)
[facility] [place] [facility]  Discharge Instructions: Thank you for choosing [facility] to provide your oncology and hematology care.  If you have a lab appointment with the Cancer Center, please go directly to the Cancer Center and check in at the registration area.  Wear comfortable clothing and clothing appropriate for easy access to any Portacath or PICC line.   We strive to give you quality time with your provider. You may need to reschedule your appointment if you arrive late (15 or more minutes).  Arriving late affects you and other patients whose appointments are after yours.  Also, if you miss three or more appointments without notifying the office, you may be dismissed from the clinic at the provider's discretion.      For prescription refill requests, have your pharmacy contact our office and allow 72 hours for refills to be completed.    Today you received the following chemotherapy and/or immunotherapy agents darzalex    To help prevent nausea and vomiting after your treatment, we encourage you to take your nausea medication as directed.  BELOW ARE SYMPTOMS THAT SHOULD BE REPORTED IMMEDIATELY: *FEVER GREATER THAN 100.4 F (38 C) OR HIGHER *CHILLS OR SWEATING *NAUSEA AND VOMITING THAT IS NOT CONTROLLED WITH YOUR NAUSEA MEDICATION *UNUSUAL SHORTNESS OF BREATH *UNUSUAL BRUISING OR BLEEDING *URINARY PROBLEMS (pain or burning when urinating, or frequent urination) *BOWEL PROBLEMS (unusual diarrhea, constipation, pain near the anus) TENDERNESS IN MOUTH AND THROAT WITH OR WITHOUT PRESENCE OF ULCERS (sore throat, sores in mouth, or a toothache) UNUSUAL RASH, SWELLING OR PAIN  UNUSUAL VAGINAL DISCHARGE OR ITCHING   Items with * indicate a potential emergency and should be followed up as soon as possible or go to the Emergency Department if any problems should occur.  Please show the CHEMOTHERAPY ALERT CARD or IMMUNOTHERAPY ALERT CARD at check-in to  the Emergency Department and triage nurse.  Should you have questions after your visit or need to cancel or reschedule your appointment, please contact [facility] [place] [facility]  [phone] and follow the prompts.  Office hours are 8:00 a.m. to 4:30 p.m. Monday - Friday. Please note that voicemails left after 4:00 p.m. may not be returned until the following business day.  We are closed weekends and major holidays. You have access to a nurse at all times for urgent questions. Please call the main number to the clinic [phone] and follow the prompts.  For any non-urgent questions, you may also contact your provider using MyChart. We now offer e-Visits for anyone 18 and older to request care online for non-urgent symptoms. For details visit [email or web address].   Also download the MyChart app! Go to the app store, search "MyChart", open the app, select [place], and log in with your MyChart username and password.    

## 2023-01-24 NOTE — Progress Notes (Signed)
Cancer Center CONSULT NOTE  Patient Care Team: [name], MD as PCP - General (Internal Medicine)   CANCER STAGING   Cancer Staging  Multiple myeloma [facility]) Staging form: Plasma Cell Myeloma and Plasma Cell Disorders, AJCC 8th Edition - Clinical stage from 12/05/2022: RISS Stage II (Beta-2-microglobulin (mg/L): 3.7, Albumin (g/dL): 3.6, ISS: Stage II, High-risk cytogenetics: Absent, LDH: Normal) - Signed by [name], MD on 12/26/2022 Stage prefix: Initial diagnosis Beta 2 microglobulin range (mg/L): 3.5 to 5.49 Albumin range (g/dL): Greater than or equal to 3.5 Cytogenetics: 1q addition, Other mutation   ASSESSMENT & PLAN:  Melvin Gilmore 82 y.o. male with pmh of hypertension, hyperlipidemia, anxiety, GERD, BPH, hypothyroidism, prostate cancer s/p HIFU was seen by primary on November 13, 2022 for acute onset of lower back pain.  Was referred to medical oncology for finding of L1 lesion suspicious for metastasis.  # Multiple myeloma, RISS Stage II -MRI thoracic and lumbar spine with and without contrast was reviewed. Showed unchanged subacute compression fracture at T12.  Focal enhancing marrow replacing lesions in T6, T8, T5, T2 all suspicious for metastatic disease.  L1 lesion measuring 16 mm.  Multilevel degenerative changes present.  -SPEP/IFE showed biclonal IgA kappa paraprotein with M spike 2.1 g/dL and second spike at 0.4 g/dL.  Kappa 453, lambda 6.8 with a ratio of 66.7.  Iron panel and B12 are normal.  PSA 3.44.  CBC showed mild anemia 13.4, chronic.  On CMP normal creatinine and calcium levels.  Elevated total protein 8.8.  LDH normal.  Beta-2 microglobulin 3.7.  Albumin 3.6.  24-hour urine/UPEP showed 1.1 g protein with no M spike.  -BMBx Hypercellular marrow with 60% plasma cells.  IHC positive for CD138/ mum 1.  Kappa restricted consistent with multiple myeloma. L1 vertebral body biopsy showed numerous plasma cells. Cytogenetics - del (13q) and gain 1.     -PET/CT showed activity in L1 and left eighth rib concerning for myeloma.  -Started on Dara-RD on 12/26/2022.  He is neutropenic from Revlimid.  Revlimid 10 mg held on 01/13/2023.   -CBC with differential show improvement in WBC to 2.1 and ANC to 1.1.  Will proceed with daratumumab.  Change p.o. Dex to IV due to intractable hiccups.  I have reached out to pharmacy for new Revlimid prescription dose from 10 mg to 5 mg 3 weeks on 1 week off.  Kappa chains decreasing and M spike decreased from 2.1 to 1.2 g/dL.  # Intractable hiccups -Hiccups start after treatment and last for about 12 to 24 hours.  Started with second cycle -On side effect check, there is mention of hiccups with dexamethasone frequency not defined.  With the first cycle he received IV Dex which was different from the cycle cycle when he received p.o. Dex.  With the next cycle I will try IV Dex again and see if it helps.  # T12 pathological fracture # Cancer-related pain - s/p kyphoplasty by Dr. Elijio Miles on 12/06/22.  No improvement in pain -Completed palliative RT on 01/20/2023.  Denies any improvement yet. -I discussed with the patient and his wife in depth about optimizing the pain regimen.  He has not started long-acting morphine.  He is taking oxycodone IR once or twice a day.  He is agreeable to start his morphine and see how his doing with the pain.  # Opioid-induced constipation -Manageable with senna, MiraLAX, lactulose, Movantik as needed.  # History of prostate cancer - follows with Dr. Evelene Croon, urology  for elevated PSA.  MRI prostate showed 1 mm category 5 lesion of anterior transition zone.  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 s/p HIFU of the prostate on 02/07/2022.  PSA level from 10/08/2022 was 3.34.  -PSA from 11/21/2022 3.44.  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  # Poor appetite # Weight loss -Could be  from cancer and uncontrolled pain.  Pain management as above. Nutrition following. -He is taking Marinol 2.5 mg once daily.  Could not tolerate twice daily dose because it was making him feel hyper at night and inability to sleep.  Not noticing any improvement yet  # Anxiety -On longstanding Xanax 0.25 mg at night as needed.  Requesting for refill. -Anxiety has been heightened with the recent diagnosis, pain, poor appetite.  Will send for refill.  No orders of the defined types were placed in this encounter.  RTC in 1 week for MD visit, labs, cycle 2-day 1 of daratumumab  The total time spent in the appointment was 30 minutes encounter with patients including review of chart and various tests results, discussions about plan of care and coordination of care plan   All questions were answered. The patient knows to call the clinic with any problems, questions or concerns. No barriers to learning was detected.  [name], MD 7/5/202412:35 PM   HISTORY OF PRESENTING ILLNESS:  Melvin Gilmore 82 y.o. male with past medical history of hypertension, hyperlipidemia, anxiety, GERD, BPH, hypothyroidism, prostate cancer s/p HIFU was seen by primary on November 13, 2022 for acute onset of lower back pain.  Patient reports that he was sitting outside and had violent sneeze which was followed by severe back pain.  He has fallen allergies.  Had x-rays followed by MRI thoracic spine without contrast.  It showed compression fracture of T12 superior endplate with surrounding marrow edema likely subacute.  15 mm T1 hypointense marrow lesion in the anterior aspect of L1 suspicious for metastasis.  Further characterization with postcontrast MRI recommended.  Prostate cancer-follows with Dr. Evelene Croon, urology for elevated PSA.  MRI prostate showed 1 mm category 5 lesion of anterior transition zone.  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 s/p HIFU of the  prostate on 02/07/2022.  PSA level from 10/08/2022 was 3.34.  Interval history Patient seen today accompanied with his wife to prior to cycle 1 day 22 of daratumumab.  Patient continues to have considerable pain in his lower back.  Does not think radiation is helping him.  He has not started taking morphine extended release.  Using oxycodone once or twice a day which is not sufficient.  He started taking Marinol twice a day which he thinks may be helping with the pain but the evening dose makes him difficulty with sleeping.  So he is decreased the dose to once a day.  I have reviewed his chart and materials related to his cancer extensively and collaborated history with the patient. Summary of oncologic history is as follows: Oncology History  Multiple myeloma (HCC)  12/05/2022 Cancer Staging   Staging form: Plasma Cell Myeloma and Plasma Cell Disorders, AJCC 8th Edition - Clinical stage from 12/05/2022: RISS Stage II (Beta-2-microglobulin (mg/L): 3.7, Albumin (g/dL): 3.6, ISS: Stage II, High-risk cytogenetics: Absent, LDH: Normal) - Signed by [name], MD on 12/26/2022 Stage prefix: Initial diagnosis Beta 2 microglobulin range (mg/L): 3.5 to 5.49 Albumin range (g/dL): Greater than  or equal to 3.5 Cytogenetics: 1q addition, Other mutation   12/13/2022 Initial Diagnosis   Multiple myeloma (HCC)   12/26/2022 -  Chemotherapy   Patient is on Treatment Plan : MYELOMA NEWLY DIAGNOSED Daratumumab IV + Lenalidomide + Dexamethasone Weekly (DaraRd) q28d       MEDICAL HISTORY:  Past Medical History:  Diagnosis Date   Anxiety    a.) on BZO (alprazolam) PRN   Cervicalgia    Chest pain, non-cardiac    Elevated PSA    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   Distal with moderate free air surrounding Hiatal Hernia   Gallbladder polyp    Gastritis    GERD (gastroesophageal reflux disease)    History of hiatal hernia    HLD (hyperlipidemia)    HTN (hypertension)    Hypothyroidism    Meniere disease     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   Prostate cancer (HCC) 2023   Seasonal allergies    Skin cancer    a.) ears   Wears partial dentures    lower    SURGICAL HISTORY: Past Surgical History:  Procedure Laterality Date   APPENDECTOMY     CATARACT EXTRACTION W/PHACO Right 01/15/2016   Procedure: CATARACT EXTRACTION PHACO AND INTRAOCULAR LENS PLACEMENT (IOC);  Surgeon: Sherald Hess, MD;  Location: [facility];  Service: Ophthalmology;  Laterality: Right;  RIGHT CALL CELL PHONE WITH TIME   CHOLECYSTECTOMY     COLONOSCOPY     COLONOSCOPY WITH ESOPHAGOGASTRODUODENOSCOPY (EGD)     ESOPHAGOGASTRODUODENOSCOPY  07/2021   ESOPHAGOGASTRODUODENOSCOPY (EGD) WITH PROPOFOL N/A 09/23/2017   Procedure: ESOPHAGOGASTRODUODENOSCOPY (EGD) WITH PROPOFOL;  Surgeon: Toledo, Boykin Nearing, MD;  Location: [facility];  Service: Gastroenterology;  Laterality: N/A;   HIGH INTENSITY FOCUSED ULTRASOUND (HIFU) OF THE PROSTATE N/A 02/07/2022   Procedure: HIGH INTENSITY FOCUSED ULTRASOUND (HIFU) OF THE PROSTATE;  Surgeon: Orson Ape, MD;  Location: [facility];  Service: Urology;  Laterality: N/A;   HYDROCELE EXCISION / REPAIR     IR KYPHO THORACIC WITH BONE BIOPSY  12/06/2022   IR RADIOLOGIST EVAL & MGMT  11/21/2022   LASIX RT EYE     PROSTATE BIOPSY N/A 01/03/2022   Procedure: PROSTATE BIOPSY  Addison Jungbluth;  Surgeon: Orson Ape, MD;  Location: [facility];  Service: Urology;  Laterality: N/A;   ROTATOR CUFF REPAIR Left 2001    SOCIAL HISTORY: Social History   Socioeconomic History   Marital status: Married    Spouse name: Not on file   Number of children: Not on file   Years of education: Not on file   Highest education level: Not on file  Occupational History   Not on file  Tobacco Use   Smoking status: Never   Smokeless tobacco: Never  Vaping Use   Vaping Use: Never used  Substance and Sexual Activity   Alcohol use: Yes    Alcohol/week: 7.0 standard drinks of  alcohol    Types: 7 Glasses of wine per week    Comment: wine occ   Drug use: No   Sexual activity: Not on file  Other Topics Concern   Not on file  Social History Narrative   Not on file   Social Determinants of Health   Financial Resource Strain: Low Risk  (11/21/2022)   Overall Financial Resource Strain (CARDIA)    Difficulty of Paying Living Expenses: Not hard at all  Food Insecurity: No Food Insecurity (11/21/2022)   Hunger Vital Sign    Worried  About Running Out of Food in the Last Year: Never true    Ran Out of Food in the Last Year: Never true  Transportation Needs: Not on file  Physical Activity: Not on file  Stress: Not on file  Social Connections: Not on file  Intimate Partner Violence: Not At Risk (11/21/2022)   Humiliation, Afraid, Rape, and Kick questionnaire    Fear of Current or Ex-Partner: No    Emotionally Abused: No    Physically Abused: No    Sexually Abused: No    FAMILY HISTORY: Family History  Problem Relation Age of Onset   Pneumonia Father     ALLERGIES:  is allergic to amoxicillin-pot clavulanate and meloxicam.  MEDICATIONS:  Current Outpatient Medications  Medication Sig Dispense Refill   acetaminophen (TYLENOL) 500 MG tablet Take 500 mg by mouth every 6 (six) hours as needed.     acyclovir (ZOVIRAX) 400 MG tablet Take 400 mg by mouth 2 (two) times daily.     ALPRAZolam (XANAX) 0.25 MG tablet Take 1 tablet (0.25 mg total) by mouth at bedtime as needed for anxiety. 30 tablet 0   aspirin EC 81 MG tablet Take 1 tablet (81 mg total) by mouth daily. Swallow whole. 90 tablet 2   cetirizine (ZYRTEC) 10 MG tablet Take 10 mg by mouth as needed for allergies.     docusate sodium (COLACE) 100 MG capsule Take 2 capsules (200 mg total) by mouth 2 (two) times daily. 120 capsule 3   dronabinol (MARINOL) 2.5 MG capsule Take 1 capsule (2.5 mg total) by mouth 2 (two) times daily before a meal. 60 capsule 0   fluticasone (FLONASE) 50 MCG/ACT nasal spray Place 1  spray into both nostrils daily.     gabapentin (NEURONTIN) 100 MG capsule Take 1 capsule (100 mg total) by mouth 3 (three) times daily. 30 capsule 0   lactulose (CHRONULAC) 10 GM/15ML solution Take 10-20 GM (15-30 ml) one - two times a day as needed for constipation 475 mL 0   lenalidomide (REVLIMID) 10 MG capsule Take 1 capsule (10 mg total) by mouth daily. Take for 21 days, then hold for 7 days. Repeat every 28 days. 21 capsule 0   levothyroxine (SYNTHROID, LEVOTHROID) 50 MCG tablet Take 50 mcg by mouth daily before breakfast.     naloxone (NARCAN) nasal spray 4 mg/0.1 mL SPRAY 1 SPRAY INTO ONE NOSTRIL AS DIRECTED FOR OPIOID OVERDOSE (TURN PERSON ON SIDE AFTER DOSE. IF NO RESPONSE IN 2-3 MINUTES OR PERSON RESPONDS BUT RELAPSES, REPEAT USING A NEW SPRAY DEVICE AND SPRAY INTO THE OTHER NOSTRIL. CALL 911 AFTER USE.) * EMERGENCY USE ONLY * 1 each 0   omeprazole (PRILOSEC) 40 MG capsule Take 40 mg by mouth every morning.     ondansetron (ZOFRAN) 8 MG tablet Take 1 tablet (8 mg total) by mouth every 8 (eight) hours as needed for nausea or vomiting. 30 tablet 1   oxyCODONE (OXY IR/ROXICODONE) 5 MG immediate release tablet Take 1-2 tablets (5-10 mg total) by mouth every 4 (four) hours as needed for severe pain. 120 tablet 0   Plant Sterols and Stanols (CHOLESTOFF) 450 MG TABS Take 1 tablet by mouth every morning.     predniSONE (DELTASONE) 10 MG tablet Take 1 tablet (10mg ) daily x 1 week, then take 5mg  (1/2 tablet) daily x 1 week, then stop. 15 tablet 0   Probiotic Product (PROBIOTIC ADVANCED PO) Take 1 tablet by mouth at bedtime.     No current facility-administered medications  for this visit.    REVIEW OF SYSTEMS:   Pertinent information mentioned in HPI All other systems were reviewed with the patient and are negative.  PHYSICAL EXAMINATION: ECOG PERFORMANCE STATUS: 1 - Symptomatic but completely ambulatory  Vitals:   01/24/23 0928  BP: 123/74  Pulse: 78  Temp: 98.6 F (37 C)  SpO2: 99%     Filed Weights   01/24/23 0928  Weight: 138 lb 12.8 oz (63 kg)     GENERAL:alert, no distress and comfortable SKIN: skin color, texture, turgor are normal, no rashes or significant lesions EYES: normal, conjunctiva are pink and non-injected, sclera clear OROPHARYNX:no exudate, no erythema and lips, buccal mucosa, and tongue normal  NECK: supple, thyroid normal size, non-tender, without nodularity LYMPH:  no palpable lymphadenopathy in the cervical, axillary or inguinal LUNGS: clear to auscultation and percussion with normal breathing effort HEART: regular rate & rhythm and no murmurs and no lower extremity edema ABDOMEN:abdomen soft, non-tender and normal bowel sounds Musculoskeletal:no cyanosis of digits and no clubbing  PSYCH: alert & oriented x 3 with fluent speech NEURO: no focal motor/sensory deficits  LABORATORY DATA:  I have reviewed the data as listed Lab Results  Component Value Date   WBC 2.1 (L) 01/24/2023   HGB 11.2 (L) 01/24/2023   HCT 31.6 (L) 01/24/2023   MCV 98.8 01/24/2023   PLT 173 01/24/2023   Recent Labs    12/13/22 1454 01/13/23 1350 01/16/23 0934  NA 132* 129* 129*  K 4.8 3.8 4.0  CL 93* 97* 97*  CO2 27 25 24   GLUCOSE 104* 96 99  BUN 20 21 12   CREATININE 1.04 0.81 0.93  CALCIUM 9.5 8.1* 8.5*  GFRNONAA >60 >60 >60  PROT 9.2* 6.3* 6.6  ALBUMIN 3.6 3.2* 3.5  AST 23 16 18   ALT 17 16 17   ALKPHOS 73 113 124  BILITOT 0.5 0.8 0.5    RADIOGRAPHIC STUDIES: I have personally reviewed the radiological images as listed and agreed with the findings in the report. No results found.

## 2023-01-24 NOTE — Progress Notes (Signed)
Dr. Alena Bills wanting for dose decrease patient to 5 mg, dose decrease rx send to [facility].

## 2023-01-26 ENCOUNTER — Emergency Department: Payer: Medicare HMO

## 2023-01-26 ENCOUNTER — Other Ambulatory Visit: Payer: Self-pay

## 2023-01-26 ENCOUNTER — Encounter: Payer: Self-pay | Admitting: Internal Medicine

## 2023-01-26 ENCOUNTER — Observation Stay
Admission: EM | Admit: 2023-01-26 | Discharge: 2023-01-28 | Disposition: A | Discharge status: Hospice | Payer: Medicare HMO | Attending: Internal Medicine | Admitting: Internal Medicine

## 2023-01-26 DIAGNOSIS — M8448XA Pathological fracture, other site, initial encounter for fracture: Secondary | ICD-10-CM | POA: Diagnosis not present

## 2023-01-26 DIAGNOSIS — R634 Abnormal weight loss: Secondary | ICD-10-CM | POA: Insufficient documentation

## 2023-01-26 DIAGNOSIS — R338 Other retention of urine: Secondary | ICD-10-CM | POA: Diagnosis not present

## 2023-01-26 DIAGNOSIS — K5903 Drug induced constipation: Secondary | ICD-10-CM | POA: Diagnosis not present

## 2023-01-26 DIAGNOSIS — Z7982 Long term (current) use of aspirin: Secondary | ICD-10-CM | POA: Insufficient documentation

## 2023-01-26 DIAGNOSIS — D649 Anemia, unspecified: Secondary | ICD-10-CM | POA: Diagnosis not present

## 2023-01-26 DIAGNOSIS — R197 Diarrhea, unspecified: Secondary | ICD-10-CM | POA: Insufficient documentation

## 2023-01-26 DIAGNOSIS — S32000A Wedge compression fracture of unspecified lumbar vertebra, initial encounter for closed fracture: Secondary | ICD-10-CM

## 2023-01-26 DIAGNOSIS — E871 Hypo-osmolality and hyponatremia: Secondary | ICD-10-CM | POA: Insufficient documentation

## 2023-01-26 DIAGNOSIS — Z85828 Personal history of other malignant neoplasm of skin: Secondary | ICD-10-CM | POA: Diagnosis not present

## 2023-01-26 DIAGNOSIS — Z8546 Personal history of malignant neoplasm of prostate: Secondary | ICD-10-CM | POA: Insufficient documentation

## 2023-01-26 DIAGNOSIS — M8458XA Pathological fracture in neoplastic disease, other specified site, initial encounter for fracture: Secondary | ICD-10-CM | POA: Diagnosis not present

## 2023-01-26 DIAGNOSIS — C9002 Multiple myeloma in relapse: Secondary | ICD-10-CM | POA: Diagnosis present

## 2023-01-26 DIAGNOSIS — R112 Nausea with vomiting, unspecified: Secondary | ICD-10-CM

## 2023-01-26 DIAGNOSIS — R531 Weakness: Secondary | ICD-10-CM | POA: Diagnosis not present

## 2023-01-26 DIAGNOSIS — R2689 Other abnormalities of gait and mobility: Secondary | ICD-10-CM | POA: Diagnosis not present

## 2023-01-26 DIAGNOSIS — T402X5A Adverse effect of other opioids, initial encounter: Secondary | ICD-10-CM

## 2023-01-26 DIAGNOSIS — E039 Hypothyroidism, unspecified: Secondary | ICD-10-CM | POA: Diagnosis not present

## 2023-01-26 DIAGNOSIS — I10 Essential (primary) hypertension: Secondary | ICD-10-CM | POA: Insufficient documentation

## 2023-01-26 DIAGNOSIS — R339 Retention of urine, unspecified: Secondary | ICD-10-CM | POA: Diagnosis not present

## 2023-01-26 DIAGNOSIS — C9000 Multiple myeloma not having achieved remission: Secondary | ICD-10-CM | POA: Diagnosis not present

## 2023-01-26 DIAGNOSIS — M4856XA Collapsed vertebra, not elsewhere classified, lumbar region, initial encounter for fracture: Secondary | ICD-10-CM | POA: Diagnosis not present

## 2023-01-26 DIAGNOSIS — Z79899 Other long term (current) drug therapy: Secondary | ICD-10-CM | POA: Diagnosis not present

## 2023-01-26 DIAGNOSIS — Z515 Encounter for palliative care: Secondary | ICD-10-CM

## 2023-01-26 HISTORY — DX: Nausea with vomiting, unspecified: R11.2

## 2023-01-26 LAB — CBC
HCT: 30.8 % — ABNORMAL LOW (ref 39.0–52.0)
Hemoglobin: 11 g/dL — ABNORMAL LOW (ref 13.0–17.0)
MCH: 35.4 pg — ABNORMAL HIGH (ref 26.0–34.0)
MCHC: 35.7 g/dL (ref 30.0–36.0)
MCV: 99 fL (ref 80.0–100.0)
Platelets: 185 10*3/uL (ref 150–400)
RBC: 3.11 MIL/uL — ABNORMAL LOW (ref 4.22–5.81)
RDW: 17 % — ABNORMAL HIGH (ref 11.5–15.5)
WBC: 5.5 10*3/uL (ref 4.0–10.5)
nRBC: 0 % (ref 0.0–0.2)

## 2023-01-26 LAB — COMPREHENSIVE METABOLIC PANEL
ALT: 28 U/L (ref 0–44)
AST: 23 U/L (ref 15–41)
Albumin: 3.7 g/dL (ref 3.5–5.0)
Alkaline Phosphatase: 127 U/L — ABNORMAL HIGH (ref 38–126)
Anion gap: 10 (ref 5–15)
BUN: 17 mg/dL (ref 8–23)
CO2: 20 mmol/L — ABNORMAL LOW (ref 22–32)
Calcium: 8.1 mg/dL — ABNORMAL LOW (ref 8.9–10.3)
Chloride: 93 mmol/L — ABNORMAL LOW (ref 98–111)
Creatinine, Ser: 0.87 mg/dL (ref 0.61–1.24)
GFR, Estimated: >60 mL/min (ref >60)
Glucose, Bld: 111 mg/dL — ABNORMAL HIGH (ref 70–99)
Potassium: 3.9 mmol/L (ref 3.5–5.1)
Sodium: 123 mmol/L — ABNORMAL LOW (ref 135–145)
Total Bilirubin: 1.4 mg/dL — ABNORMAL HIGH (ref 0.3–1.2)
Total Protein: 6.2 g/dL — ABNORMAL LOW (ref 6.5–8.1)

## 2023-01-26 LAB — BASIC METABOLIC PANEL
Anion gap: 7 (ref 5–15)
BUN: 14 mg/dL (ref 8–23)
CO2: 23 mmol/L (ref 22–32)
Calcium: 8.6 mg/dL — ABNORMAL LOW (ref 8.9–10.3)
Chloride: 97 mmol/L — ABNORMAL LOW (ref 98–111)
Creatinine, Ser: 0.94 mg/dL (ref 0.61–1.24)
GFR, Estimated: >60 mL/min (ref >60)
Glucose, Bld: 108 mg/dL — ABNORMAL HIGH (ref 70–99)
Potassium: 4.2 mmol/L (ref 3.5–5.1)
Sodium: 127 mmol/L — ABNORMAL LOW (ref 135–145)

## 2023-01-26 LAB — URINALYSIS, ROUTINE W REFLEX MICROSCOPIC
Bilirubin Urine: NEGATIVE
Glucose, UA: NEGATIVE mg/dL
Ketones, ur: NEGATIVE mg/dL
Leukocytes,Ua: NEGATIVE
Nitrite: NEGATIVE
Protein, ur: NEGATIVE mg/dL
Specific Gravity, Urine: 1.01 (ref 1.005–1.030)
Squamous Epithelial / HPF: NONE SEEN /HPF (ref 0–5)
pH: 6 (ref 5.0–8.0)

## 2023-01-26 LAB — LIPASE, BLOOD: Lipase: 32 U/L (ref 11–51)

## 2023-01-26 LAB — MAGNESIUM: Magnesium: 2.3 mg/dL (ref 1.7–2.4)

## 2023-01-26 MED ORDER — METHYLNALTREXONE BROMIDE 12 MG/0.6ML ~~LOC~~ SOLN
8.0000 mg | Freq: Once | SUBCUTANEOUS | Status: AC
  Administered 2023-01-26: 8 mg via SUBCUTANEOUS
  Filled 2023-01-26: qty 0.6

## 2023-01-26 MED ORDER — LACTATED RINGERS IV SOLN: INTRAVENOUS | Status: AC

## 2023-01-26 MED ORDER — LEVOTHYROXINE SODIUM 50 MCG PO TABS
50.0000 ug | ORAL_TABLET | Freq: Every day | ORAL | Status: DC
  Administered 2023-01-27 – 2023-01-28 (×2): 50 ug via ORAL
  Filled 2023-01-26 (×2): qty 1

## 2023-01-26 MED ORDER — GADOBUTROL 1 MMOL/ML IV SOLN
6.0000 mL | Freq: Once | INTRAVENOUS | Status: AC | PRN
  Administered 2023-01-26: 6 mL via INTRAVENOUS

## 2023-01-26 MED ORDER — MORPHINE SULFATE ER 15 MG PO TBCR
15.0000 mg | EXTENDED_RELEASE_TABLET | Freq: Two times a day (BID) | ORAL | Status: DC
  Administered 2023-01-26 – 2023-01-28 (×4): 15 mg via ORAL
  Filled 2023-01-26 (×4): qty 1

## 2023-01-26 MED ORDER — ONDANSETRON HCL 4 MG/2ML IJ SOLN
4.0000 mg | Freq: Once | INTRAMUSCULAR | Status: AC
  Administered 2023-01-26: 4 mg via INTRAVENOUS
  Filled 2023-01-26: qty 2

## 2023-01-26 MED ORDER — OXYCODONE HCL 5 MG PO TABS
5.0000 mg | ORAL_TABLET | Freq: Four times a day (QID) | ORAL | Status: DC | PRN
  Administered 2023-01-27: 5 mg via ORAL
  Filled 2023-01-26: qty 1

## 2023-01-26 MED ORDER — ACYCLOVIR 200 MG PO CAPS
400.0000 mg | ORAL_CAPSULE | Freq: Two times a day (BID) | ORAL | Status: DC
  Administered 2023-01-26 – 2023-01-28 (×4): 400 mg via ORAL
  Filled 2023-01-26 (×5): qty 2

## 2023-01-26 MED ORDER — ONDANSETRON HCL 4 MG/2ML IJ SOLN: 4.0000 mg | Freq: Four times a day (QID) | INTRAMUSCULAR | Status: DC | PRN

## 2023-01-26 MED ORDER — ENOXAPARIN SODIUM 40 MG/0.4ML IJ SOSY
40.0000 mg | PREFILLED_SYRINGE | INTRAMUSCULAR | Status: DC
  Administered 2023-01-26 – 2023-01-27 (×2): 40 mg via SUBCUTANEOUS
  Filled 2023-01-26 (×2): qty 0.4

## 2023-01-26 MED ORDER — ALPRAZOLAM 0.25 MG PO TABS: 0.2500 mg | ORAL_TABLET | Freq: Every evening | ORAL | Status: DC | PRN

## 2023-01-26 MED ORDER — SODIUM CHLORIDE 0.9% FLUSH
3.0000 mL | Freq: Two times a day (BID) | INTRAVENOUS | Status: DC
  Administered 2023-01-26 – 2023-01-28 (×4): 3 mL via INTRAVENOUS

## 2023-01-26 MED ORDER — MORPHINE SULFATE (PF) 4 MG/ML IV SOLN
4.0000 mg | INTRAVENOUS | Status: DC | PRN
  Administered 2023-01-26: 4 mg via INTRAVENOUS
  Filled 2023-01-26: qty 1

## 2023-01-26 MED ORDER — MORPHINE SULFATE ER 15 MG PO TBCR: 15.0000 mg | EXTENDED_RELEASE_TABLET | Freq: Two times a day (BID) | ORAL | Status: DC

## 2023-01-26 MED ORDER — DRONABINOL 2.5 MG PO CAPS
2.5000 mg | ORAL_CAPSULE | Freq: Every day | ORAL | Status: DC
  Administered 2023-01-27 – 2023-01-28 (×2): 2.5 mg via ORAL
  Filled 2023-01-26 (×2): qty 1

## 2023-01-26 MED ORDER — IOHEXOL 300 MG/ML  SOLN
100.0000 mL | Freq: Once | INTRAMUSCULAR | Status: AC | PRN
  Administered 2023-01-26: 80 mL via INTRAVENOUS

## 2023-01-26 MED ORDER — SODIUM CHLORIDE 0.9 % IV BOLUS
500.0000 mL | Freq: Once | INTRAVENOUS | Status: AC
  Administered 2023-01-26: 500 mL via INTRAVENOUS

## 2023-01-26 MED ORDER — ADULT MULTIVITAMIN W/MINERALS CH
1.0000 | ORAL_TABLET | Freq: Every day | ORAL | Status: DC
  Administered 2023-01-26 – 2023-01-28 (×3): 1 via ORAL
  Filled 2023-01-26 (×3): qty 1

## 2023-01-26 MED ORDER — ENSURE ENLIVE PO LIQD
237.0000 mL | Freq: Two times a day (BID) | ORAL | Status: DC
  Administered 2023-01-27: 237 mL via ORAL

## 2023-01-26 MED ORDER — PANTOPRAZOLE SODIUM 40 MG PO TBEC
40.0000 mg | DELAYED_RELEASE_TABLET | Freq: Every day | ORAL | Status: DC
  Administered 2023-01-27 – 2023-01-28 (×2): 40 mg via ORAL
  Filled 2023-01-26 (×2): qty 1

## 2023-01-26 MED ORDER — ONDANSETRON HCL 4 MG PO TABS: 4.0000 mg | ORAL_TABLET | Freq: Four times a day (QID) | ORAL | Status: DC | PRN

## 2023-01-26 MED ORDER — GABAPENTIN 100 MG PO CAPS: 100.0000 mg | ORAL_CAPSULE | Freq: Every day | ORAL | Status: DC | PRN

## 2023-01-26 MED ORDER — ASPIRIN 81 MG PO TBEC
81.0000 mg | DELAYED_RELEASE_TABLET | Freq: Every day | ORAL | Status: DC
  Administered 2023-01-27 – 2023-01-28 (×2): 81 mg via ORAL
  Filled 2023-01-26 (×3): qty 1

## 2023-01-26 NOTE — Progress Notes (Signed)
Evaluated patient's imaging, no clear areas of compression which may be causing urinary retention. Does have significant fecal impaction on CT scan, consider correlation with urinary retention   At this point no indication for surgical intervention.  We will perform a full evaluation in the morning.

## 2023-01-26 NOTE — ED Notes (Signed)
Bladder scanned. >921mL Robinson, MD, made aware

## 2023-01-26 NOTE — ED Triage Notes (Addendum)
First Nurse Note;  Pt via [facility] from home. Pt c/o weakness and NVD for the past couple of days. Pt has a hx of cancer and receiving chemo and radiation. Pt is A&Ox4 and NAD 99.5 oral  107 CBG  84 HR  128/74 BP  20G L AC, of NS bolus

## 2023-01-26 NOTE — ED Triage Notes (Signed)
Refer to First nurse note 

## 2023-01-26 NOTE — Assessment & Plan Note (Signed)
History of hypertension noted in chart, however patient is no longer on any antihypertensives.  Will continue to monitor while admitted.

## 2023-01-26 NOTE — Assessment & Plan Note (Signed)
Patient presenting with increased lower back pain compared with chronic back pain with evidence of new L1 and L3 fractures and multiple previous compression fractures secondary to multiple myeloma.  Patient's pain has not been well-controlled with home oxycodone.  He has been prescribed morphine ER by oncology, however has been too nervous to start.  - Neurosurgery consulted; appreciate their recommendations - MRI total spine pending - Continue home oxycodone every 8 hours as needed - Start previously prescribed morphine ER 15 mg twice daily

## 2023-01-26 NOTE — Assessment & Plan Note (Addendum)
I suspect this is multifactorial but acutely worsened by GI fluid losses.  I suspect this will improve after IV fluid resuscitation.  No focal weakness noted on examination.

## 2023-01-26 NOTE — Assessment & Plan Note (Addendum)
Patient presented with nausea and inability to urinate with marked urinary retention seen on CT imaging. Over 1.3L out once foley was placed. Concerning for spinal etiology given multiple fractures seen on imaging.  Differential also includes medication side effect as patient is on chronic opioids.   - MRI of total spine pending - Foley catheter placed; will likely need on discharge due to bladder stretch injury - Strict in and out - Monitor postobstructive diuresis

## 2023-01-26 NOTE — ED Provider Notes (Signed)
[facility] Provider Note    Event Date/Time   First MD Initiated Contact with Patient 01/26/23 1513     (approximate)   History   Emesis   HPI  Melvin Gilmore is a 82 y.o. male with a history of multiple myeloma on chemotherapy status postradiation presents to the ER for evaluation of profuse multiple episodes of watery diarrhea.  Did have some fevers last night 102.  No vomiting no nausea.  Is having chronic back pain but feeling very weak and dehydrated.  Not currently on any antibiotics.     Physical Exam   Triage Vital Signs: ED Triage Vitals  Enc Vitals Group     BP 01/26/23 1218 128/69     Pulse Rate 01/26/23 1218 87     Resp 01/26/23 1218 17     Temp 01/26/23 1218 99.8 F (37.7 C)     Temp Source 01/26/23 1218 Oral     SpO2 01/26/23 1218 98 %     Weight 01/26/23 1219 135 lb (61.2 kg)     Height 01/26/23 1219 5\' 11"  (1.803 m)     Head Circumference --      Peak Flow --      Pain Score 01/26/23 1219 0     Pain Loc --      Pain Edu? --      Excl. in GC? --     Most recent vital signs: Vitals:   01/26/23 1530 01/26/23 2140  BP: 122/64 (!) 154/67  Pulse: 76 82  Resp: 16 18  Temp:  98.9 F (37.2 C)  SpO2: 100% 99%     Constitutional: Alert  Eyes: Conjunctivae are normal.  Head: Atraumatic. Nose: No congestion/rhinnorhea. Mouth/Throat: Mucous membranes are moist.   Neck: Painless ROM.  Cardiovascular:   Good peripheral circulation. Respiratory: Normal respiratory effort.  No retractions.  Gastrointestinal: Soft with suprapubic fullness and mild lower abdominal tenderness to palpation. Musculoskeletal:  no deformity Neurologic:  MAE spontaneously. No gross focal neurologic deficits are appreciated.  Skin:  Skin is warm, dry and intact. No rash noted. Psychiatric: Mood and affect are normal. Speech and behavior are normal.    ED Results / Procedures / Treatments   Labs (all labs ordered are listed, but only abnormal  results are displayed) Labs Reviewed  COMPREHENSIVE METABOLIC PANEL - Abnormal; Notable for the following components:      Result Value   Sodium 123 (*)    Chloride 93 (*)    CO2 20 (*)    Glucose, Bld 111 (*)    Calcium 8.1 (*)    Total Protein 6.2 (*)    Alkaline Phosphatase 127 (*)    Total Bilirubin 1.4 (*)    All other components within normal limits  CBC - Abnormal; Notable for the following components:   RBC 3.11 (*)    Hemoglobin 11.0 (*)    HCT 30.8 (*)    MCH 35.4 (*)    RDW 17.0 (*)    All other components within normal limits  URINALYSIS, ROUTINE W REFLEX MICROSCOPIC - Abnormal; Notable for the following components:   Color, Urine STRAW (*)    APPearance CLEAR (*)    Hgb urine dipstick MODERATE (*)    Bacteria, UA RARE (*)    All other components within normal limits  BASIC METABOLIC PANEL - Abnormal; Notable for the following components:   Sodium 127 (*)    Chloride 97 (*)    Glucose, Bld  108 (*)    Calcium 8.6 (*)    All other components within normal limits  GASTROINTESTINAL PANEL BY PCR, STOOL (REPLACES STOOL CULTURE)  C DIFFICILE QUICK SCREEN W PCR REFLEX    LIPASE, BLOOD  MAGNESIUM  BASIC METABOLIC PANEL  CBC  MAGNESIUM     EKG ED ECG REPORT I, Willy Eddy, the attending physician, personally viewed and interpreted this ECG.   Date: 01/26/2023  EKG Time: 16:08  Rate: 75  Rhythm: sinus  Axis: normal  Intervals: normal  ST&T Change: no stemi, no depressions     RADIOLOGY Please see ED Course for my review and interpretation.  I personally reviewed all radiographic images ordered to evaluate for the above acute complaints and reviewed radiology reports and findings.  These findings were personally discussed with the patient.  Please see medical record for radiology report.    PROCEDURES:  Critical Care performed: Yes, see critical care procedure note(s)  .Critical Care  Performed by: Willy Eddy, MD Authorized by:  Willy Eddy, MD   Critical care provider statement:    Critical care time (minutes):  34   Critical care was necessary to treat or prevent imminent or life-threatening deterioration of the following conditions:  Metabolic crisis   Critical care was time spent personally by me on the following activities:  Ordering and performing treatments and interventions, ordering and review of laboratory studies, ordering and review of radiographic studies, pulse oximetry, re-evaluation of patient's condition, review of old charts, obtaining history from patient or surrogate, examination of patient, evaluation of patient's response to treatment, discussions with primary provider, discussions with consultants and development of treatment plan with patient or surrogate    MEDICATIONS ORDERED IN ED: Medications  enoxaparin (LOVENOX) injection 40 mg (40 mg Subcutaneous Given 01/26/23 2109)  sodium chloride flush (NS) 0.9 % injection 3 mL (3 mLs Intravenous Given 01/26/23 2232)  ondansetron (ZOFRAN) tablet 4 mg (has no administration in time range)    Or  ondansetron (ZOFRAN) injection 4 mg (has no administration in time range)  multivitamin with minerals tablet 1 tablet (1 tablet Oral Given 01/26/23 2108)  lactated ringers infusion ( Intravenous New Bag/Given 01/26/23 2155)  morphine (MS CONTIN) 12 hr tablet 15 mg (15 mg Oral Given 01/26/23 2108)  ALPRAZolam (XANAX) tablet 0.25 mg (has no administration in time range)  acyclovir (ZOVIRAX) 200 MG capsule 400 mg (400 mg Oral Given 01/26/23 2232)  aspirin EC tablet 81 mg (has no administration in time range)  dronabinol (MARINOL) capsule 2.5 mg (has no administration in time range)  gabapentin (NEURONTIN) capsule 100 mg (has no administration in time range)  levothyroxine (SYNTHROID) tablet 50 mcg (has no administration in time range)  pantoprazole (PROTONIX) EC tablet 40 mg (has no administration in time range)  oxyCODONE (Oxy IR/ROXICODONE) immediate release tablet  5 mg (has no administration in time range)  feeding supplement (ENSURE ENLIVE / ENSURE PLUS) liquid 237 mL (has no administration in time range)  sodium chloride 0.9 % bolus 500 mL (0 mLs Intravenous Stopped 01/26/23 1658)  ondansetron (ZOFRAN) injection 4 mg (4 mg Intravenous Given 01/26/23 1547)  iohexol (OMNIPAQUE) 300 MG/ML solution 100 mL (80 mLs Intravenous Contrast Given 01/26/23 1621)  gadobutrol (GADAVIST) 1 MMOL/ML injection 6 mL (6 mLs Intravenous Contrast Given 01/26/23 1923)  methylnaltrexone (RELISTOR) injection 8 mg (8 mg Subcutaneous Given 01/26/23 2230)     IMPRESSION / MDM / ASSESSMENT AND PLAN / ED COURSE  I reviewed the triage vital signs and the  nursing notes.                              Differential diagnosis includes, but is not limited to, Dehydration, sepsis, pna, uti, hypoglycemia, cva, drug effect, sbo, colitis, diverticulitis,   Patient presenting to the ER for evaluation of symptoms as described above.  Based on symptoms, risk factors and considered above differential, this presenting complaint could reflect a potentially life-threatening illness therefore the patient will be placed on continuous pulse oximetry and telemetry for monitoring.  Laboratory evaluation will be sent to evaluate for the above complaints.  Patient with evidence of acute hyponatremia.  Will give IV hydration.  Is having pain so we will give IV morphine as well as nausea medication.  Given his symptoms do feel he is can require hospitalization.  Will order CT imaging to further eval the abdominal pain.    Clinical Course as of 01/26/23 2326  Wynelle Link Jan 26, 2023  1659 CT imaging on my review and interpretation shows evidence of bladder retention.  Patient attempted to urinate and only able to pass very little bit.  There are new compression fractures no sign of retropulsion per radiology.  Have a lower suspicion for cauda equina she has no other neurodeficits no saddle anesthesia good tone.  Will place  Foley catheter consult hospitalist for admission. [PR]  1805 Given the compression fractures I discussed the case with neurosurgery who has recommended further evaluation with MRI of the spine to evaluate for any other compression of injury or deformities.  Discussed case in consultation with hospitalist. [PR]    Clinical Course User Index [PR] Willy Eddy, MD     FINAL CLINICAL IMPRESSION(S) / ED DIAGNOSES   Final diagnoses:  Hyponatremia  Urinary retention  Compression fracture of lumbar vertebra, unspecified lumbar vertebral level, initial encounter [facility])     Rx / DC Orders   ED Discharge Orders     None        Note:  This document was prepared using Dragon voice recognition software and may include unintentional dictation errors.    Willy Eddy, MD 01/26/23 [phone]

## 2023-01-26 NOTE — Assessment & Plan Note (Signed)
-   Continue home Synthroid °

## 2023-01-26 NOTE — ED Notes (Signed)
RN assisted pt to bathroom. Pt stood up out of wheel chair with no assistance needed. Pt did have diarrhea.

## 2023-01-26 NOTE — Assessment & Plan Note (Addendum)
Likely multifactorial in the setting of opioid-induced constipation and bladder distention leading to possible obstruction.  Bladder has been decompressed at this time.  Given patient is currently on immunomodulators, will rule out infectious etiology, however unlikely.   - One-time soapsuds enema to try and break up stool ball - GI panel pending

## 2023-01-26 NOTE — Assessment & Plan Note (Addendum)
Patient states that he continues to have difficulty with constipation despite taking senna, MiraLAX and lactulose.  He has tried Movantik in the past without improvement.  Given this is opioid-induced, Methylnaltrexone may be a good option to try while admitted. It may be beneficial to consider Amitiza as well in the future.   - Hold home regimen with MiraLAX, senna, and lactulose - Trial of methylnaltrexone with soap sud enema

## 2023-01-26 NOTE — ED Notes (Signed)
This RN assisted pt to toilet. Placed him in non slip socks for him to have a small BM. Pt did not urinate for specimen to be collected. Left with urinal to try again later. Pt then placed back in family wait.

## 2023-01-26 NOTE — Assessment & Plan Note (Signed)
Likely due to poor p.o. intake and severe diarrhea over the last 24 hours.  History of chronic hyponatremia with baseline sodium around 129.  Currently 123.  - Repeat BMP pending - IV fluid resuscitation

## 2023-01-26 NOTE — Assessment & Plan Note (Signed)
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  Revlimid hide has been on hold but he is still on daratumumab.  -Continue outpatient follow-up with oncology

## 2023-01-26 NOTE — ED Notes (Signed)
Patient transported to MRI 

## 2023-01-26 NOTE — H&P (Signed)
History and Physical    Patient: Melvin Gilmore:[id number] DOB: [birth year] DOA: 01/26/2023 DOS: the patient was seen and examined on 01/26/2023 PCP: Melvin Arbour, MD  Patient coming from: Home  Chief Complaint:  Chief Complaint  Patient presents with   Emesis   HPI: Melvin Gilmore is a 82 y.o. male with medical history significant of multiple myeloma stage II currently on on daratumumab and Revlimid and s/p recent radiation therapy, multiple pathologic spinal fractures s/p kyphoplasty, hypertension, hyperlipidemia, Mnire disease, normocytic anemia, anxiety disorder on Xanax, who presents to the ED due to worsening weakness, nausea, vomiting and diarrhea.  Melvin Gilmore states that he has been experiencing increased lower back pain lately.  Then over the last day, he has had significant nonbloody, nonmelanotic diarrhea.  He states that last night, he was running to the bathroom every few minutes.  In addition, he noted that he was having trouble urinating and this was making his abdomen feels distended.  This morning, he did have a fever slightly above 100.  He denies any chest pain, shortness of breath, palpitations.  He endorses generalized weakness since diarrheal symptoms started, but denies any focal weakness.  He has been taking his oxycodone daily without too much improvement in pain control; he and his wife have been nervous to start morphine ER.  ED course: On arrival to the ED, patient was normotensive at 128/69 with heart rate of 98.  He was saturating at 98% on room air.  He was afebrile at 99.8.  Initial workup has been notable for sodium of 123, bicarb 20, glucose 111, creatinine 0.87 with GFR above 60, hemoglobin 11.  Urinalysis with moderate hematuria and rare bacteria.  CT of the abdomen was obtained that demonstrated multiple vertebral fractures, however L1 and L3 may be recent, significant bladder distention with mild bilateral hydroureteronephrosis and increased colonic  stool burden.  Bladder scan with over 1 L present; Foley ordered.  Neurosurgery consulted with recommendations to order full spinal MRI.  [facility] contacted for admission.  Review of Systems: As mentioned in the history of present illness. All other systems reviewed and are negative.  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   Nausea vomiting and diarrhea 01/26/2023   Prostate cancer (HCC) 2023   Seasonal allergies    Skin cancer    a.) ears   Wears partial dentures    lower   Past Surgical History:  Procedure Laterality Date   APPENDECTOMY     CATARACT EXTRACTION W/PHACO Right 01/15/2016   Procedure: CATARACT EXTRACTION PHACO AND INTRAOCULAR LENS PLACEMENT (IOC);  Surgeon: Melvin Hess, MD;  Location: [facility];  Service: Ophthalmology;  Laterality: Right;  RIGHT CALL CELL PHONE WITH TIME   CHOLECYSTECTOMY     COLONOSCOPY     COLONOSCOPY WITH ESOPHAGOGASTRODUODENOSCOPY (EGD)     ESOPHAGOGASTRODUODENOSCOPY  07/2021   ESOPHAGOGASTRODUODENOSCOPY (EGD) WITH PROPOFOL N/A 09/23/2017   Procedure: ESOPHAGOGASTRODUODENOSCOPY (EGD) WITH PROPOFOL;  Surgeon: Toledo, Boykin Nearing, MD;  Location: [facility];  Service: Gastroenterology;  Laterality: N/A;   HIGH INTENSITY FOCUSED ULTRASOUND (HIFU) OF THE PROSTATE N/A 02/07/2022   Procedure: HIGH INTENSITY FOCUSED ULTRASOUND (HIFU) OF THE PROSTATE;  Surgeon: Melvin Ape, MD;  Location: [facility];  Service: Urology;  Laterality: N/A;   HYDROCELE EXCISION / REPAIR     IR KYPHO THORACIC WITH BONE  BIOPSY  12/06/2022   IR RADIOLOGIST EVAL & MGMT  11/21/2022   LASIX RT EYE     PROSTATE BIOPSY N/A 01/03/2022   Procedure: PROSTATE BIOPSY  Melvin Gilmore;  Surgeon: Melvin Ape, MD;  Location: [facility];  Service: Urology;  Laterality: N/A;   ROTATOR CUFF REPAIR Left 2001   Social History:  reports that he has never smoked. He has never used smokeless tobacco. He reports current alcohol use of about 7.0 standard drinks of alcohol per week. He reports that he does not use drugs.  Allergies  Allergen Reactions   Amoxicillin-Pot Clavulanate Rash and Other (See Comments)    Pt states he can't recall what the reactions were   Meloxicam Rash and Other (See Comments)    Family History  Problem Relation Age of Onset   Pneumonia Father     Prior to Admission medications   Medication Sig Start Date End Date Taking? Authorizing Provider  acetaminophen (TYLENOL) 500 MG tablet Take 500 mg by mouth every 6 (six) hours as needed.    [provider]  acyclovir (ZOVIRAX) 400 MG tablet Take 400 mg by mouth 2 (two) times daily.    Melvin Barter, MD  ALPRAZolam Prudy Feeler) 0.25 MG tablet Take 1 tablet (0.25 mg total) by mouth at bedtime as needed for anxiety. 01/16/23   Melvin Barter, MD  aspirin EC 81 MG tablet Take 1 tablet (81 mg total) by mouth daily. Swallow whole. 12/13/22 03/13/23  Melvin Barter, MD  cetirizine (ZYRTEC) 10 MG tablet Take 10 mg by mouth as needed for allergies.    [provider]  docusate sodium (COLACE) 100 MG capsule Take 2 capsules (200 mg total) by mouth 2 (two) times daily. 02/07/22   Melvin Ape, MD  dronabinol (MARINOL) 2.5 MG capsule Take 1 capsule (2.5 mg total) by mouth 2 (two) times daily before a meal. 01/16/23 02/15/23  Melvin Barter, MD  fluticasone (FLONASE) 50 MCG/ACT nasal spray Place 1 spray into both nostrils daily.    [provider]  gabapentin (NEURONTIN) 100 MG capsule Take 1 capsule (100 mg total) by mouth 3 (three) times daily.  01/03/23   Borders, Melvin Eastern, NP  lactulose (CHRONULAC) 10 GM/15ML solution Take 10-20 GM (15-30 ml) one - two times a day as needed for constipation 12/25/22   Borders, Melvin Eastern, NP  lenalidomide (REVLIMID) 5 MG capsule Take 1 capsule (5 mg total) by mouth daily. Take for 21 days, then hold for 7 days. Repeat every 28 days. 01/24/23   Melvin Barter, MD  levothyroxine (SYNTHROID, LEVOTHROID) 50 MCG tablet Take 50 mcg by mouth daily before breakfast.    [provider]  naloxone (NARCAN) nasal spray 4 mg/0.1 mL SPRAY 1 SPRAY INTO ONE NOSTRIL AS DIRECTED FOR OPIOID OVERDOSE (TURN PERSON ON SIDE AFTER DOSE. IF NO RESPONSE IN 2-3 MINUTES OR PERSON RESPONDS BUT RELAPSES, REPEAT USING A NEW SPRAY DEVICE AND SPRAY INTO THE OTHER NOSTRIL. CALL 911 AFTER USE.) * EMERGENCY USE ONLY * 01/02/23   Borders, Melvin Eastern, NP  omeprazole (PRILOSEC) 40 MG capsule Take 40  mg by mouth every morning.    [provider]  ondansetron (ZOFRAN) 8 MG tablet Take 1 tablet (8 mg total) by mouth every 8 (eight) hours as needed for nausea or vomiting. 12/20/22   Melvin Barter, MD  oxyCODONE (OXY IR/ROXICODONE) 5 MG immediate release tablet Take 1-2 tablets (5-10 mg total) by mouth every 4 (four) hours as needed for severe pain. 01/16/23 02/15/23  Melvin Barter, MD  Plant Sterols and Stanols (CHOLESTOFF) 450 MG TABS Take 1 tablet by mouth every morning.    [provider]  predniSONE (DELTASONE) 10 MG tablet Take 1 tablet (10mg ) daily x 1 week, then take 5mg  (1/2 tablet) daily x 1 week, then stop. 12/18/22   Borders, Melvin Eastern, NP  Probiotic Product (PROBIOTIC ADVANCED PO) Take 1 tablet by mouth at bedtime.    [provider]    Physical Exam: Vitals:   01/26/23 1218 01/26/23 1219 01/26/23 1530  BP: 128/69  122/64  Pulse: 87  76  Resp: 17  16  Temp: 99.8 F (37.7 C)    TempSrc: Oral    SpO2: 98%  100%  Weight:  61.2 kg   Height:  5\' 11"  (1.803 m)    Physical Exam Vitals and nursing note  reviewed.  Constitutional:      General: He is not in acute distress.    Appearance: He is normal weight.  HENT:     Head: Normocephalic and atraumatic.     Mouth/Throat:     Mouth: Mucous membranes are moist.     Pharynx: Oropharynx is clear.  Cardiovascular:     Rate and Rhythm: Normal rate and regular rhythm.     Heart sounds: No murmur heard.    No gallop.  Pulmonary:     Effort: Pulmonary effort is normal. No respiratory distress.     Breath sounds: Normal breath sounds.  Abdominal:     General: Bowel sounds are normal. There is no distension.     Palpations: Abdomen is soft.     Tenderness: There is no abdominal tenderness. There is no guarding.  Musculoskeletal:     Right lower leg: No edema.     Left lower leg: No edema.  Skin:    General: Skin is warm and dry.  Neurological:     Mental Status: He is alert and oriented to person, place, and time.     Comments: No focal extremity weakness or sensation deficits  Psychiatric:        Mood and Affect: Mood normal.        Behavior: Behavior normal.    Data Reviewed: CBC with WBC of 5.5, hemoglobin 11.0, platelets 185 CMP with sodium of 123, potassium 3.9, bicarb 20, glucose 111, creatinine 0.87, alkaline phosphatase 127, AST 23, ALT 20, total protein 6.2, total bilirubin 1.4 and GFR above 60 Urinalysis with moderate hematuria and rare bacteria only.  EKG personally reviewed.  Sinus rhythm with rate of 77.  No ST or T wave changes consistent with acute ischemia.  J-point elevation in the lateral leads noted.  MR CERVICAL SPINE W WO CONTRAST  Result Date: 01/26/2023 CLINICAL DATA:  Metastatic disease evaluation EXAM: MRI CERVICAL, THORACIC AND LUMBAR SPINE WITHOUT AND WITH CONTRAST TECHNIQUE: Multiplanar and multiecho pulse sequences of the cervical spine, to include the craniocervical junction and cervicothoracic junction, and thoracic and lumbar spine, were obtained without and with intravenous contrast. CONTRAST:  6mL  GADAVIST GADOBUTROL 1 MMOL/ML IV SOLN COMPARISON:  None Available. FINDINGS: MRI  CERVICAL SPINE FINDINGS Alignment: Grade 1 C4-5 anterolisthesis and grade 1 C5-6 retrolisthesis Vertebrae: No fracture, evidence of discitis, or bone lesion. Cord: Normal signal and morphology. Posterior Fossa, vertebral arteries, paraspinal tissues: Negative. Disc levels: C1-2: Unremarkable. C2-3: Small disc bulge with right-greater-than-left uncovertebral hypertrophy. There is no spinal canal stenosis. Moderate right neural foraminal stenosis. C3-4: Small disc bulge with uncovertebral spurring and right-greater-than-left facet hypertrophy. There is no spinal canal stenosis. Severe right and mild left neural foraminal stenosis. C4-5: Severe right facet arthrosis. There is no spinal canal stenosis. Mild right neural foraminal stenosis. C5-6: Disc bulge with bilateral uncovertebral hypertrophy. Mild spinal canal stenosis. Severe bilateral neural foraminal stenosis. C6-7: Left asymmetric disc bulge with endplate spurring. Mild spinal canal stenosis. Severe left neural foraminal stenosis. C7-T1: Normal disc space and facet joints. There is no spinal canal stenosis. No neural foraminal stenosis. MRI THORACIC SPINE FINDINGS Alignment:  Physiologic. Vertebrae: There is superior endplate signal abnormalities at T8 and T9, likely minimally depressed compression fractures. There is abnormal signal within the inferior half of T11. Chronic compression deformity of T12 with augmentation cement. There is contrast enhancement at T9, T8 and T11 but no visualized soft tissue component. Cord:  Normal signal and morphology. Paraspinal and other soft tissues: Negative. Disc levels: No spinal canal or neural foraminal stenosis. MRI LUMBAR SPINE FINDINGS Segmentation:  Standard. Alignment:  Physiologic. Vertebrae: Compression deformities at L1 and L3 with mild bone marrow edema. No soft tissue mass. Conus medullaris and cauda equina: Conus extends to the L1  level. Conus and cauda equina appear normal. Paraspinal and other soft tissues: Negative Disc levels: L1-L2: Normal disc space and facet joints. No spinal canal stenosis. No neural foraminal stenosis. L2-L3: Small disc bulge with mild facet hypertrophy. No spinal canal stenosis. No neural foraminal stenosis. L3-L4: Right asymmetric disc bulge. Mild spinal canal stenosis. Severe narrowing of the right lateral recess and mild left lateral recess narrowing. Mild right neural foraminal stenosis. L4-L5: Small disc bulge with endplate spurring. Right lateral recess narrowing without central spinal canal stenosis. Mild right and moderate left neural foraminal stenosis. L5-S1: Normal disc space and facet joints. No spinal canal stenosis. No neural foraminal stenosis. Visualized sacrum: Normal. IMPRESSION: 1. Acute to subacute compression fractures of T8, T9, L1 and L3 with mild bone marrow edema and enhancement. There are no specific imaging features to indicate a neoplastic process; but, given history of myeloma, myelomatous involvement is probable. 2. 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 3. Mild C5-6 and C6-7 spinal canal stenosis. 4. Severe right lateral recess and mild left lateral recess narrowing at L3-4. 5. Moderate left L4-5 neural foraminal stenosis. Electronically Signed   By: Deatra Robinson M.D.   On: 01/26/2023 20:05   MR Lumbar Spine W Wo Contrast  Result Date: 01/26/2023 CLINICAL DATA:  Metastatic disease evaluation EXAM: MRI CERVICAL, THORACIC AND LUMBAR SPINE WITHOUT AND WITH CONTRAST TECHNIQUE: Multiplanar and multiecho pulse sequences of the cervical spine, to include the craniocervical junction and cervicothoracic junction, and thoracic and lumbar spine, were obtained without and with intravenous contrast. CONTRAST:  6mL GADAVIST GADOBUTROL 1 MMOL/ML IV SOLN COMPARISON:  None Available. FINDINGS: MRI CERVICAL SPINE FINDINGS Alignment: Grade 1 C4-5 anterolisthesis and grade 1  C5-6 retrolisthesis Vertebrae: No fracture, evidence of discitis, or bone lesion. Cord: Normal signal and morphology. Posterior Fossa, vertebral arteries, paraspinal tissues: Negative. Disc levels: C1-2: Unremarkable. C2-3: Small disc bulge with right-greater-than-left uncovertebral hypertrophy. There is no spinal canal stenosis. Moderate right neural foraminal stenosis. C3-4: Small  disc bulge with uncovertebral spurring and right-greater-than-left facet hypertrophy. There is no spinal canal stenosis. Severe right and mild left neural foraminal stenosis. C4-5: Severe right facet arthrosis. There is no spinal canal stenosis. Mild right neural foraminal stenosis. C5-6: Disc bulge with bilateral uncovertebral hypertrophy. Mild spinal canal stenosis. Severe bilateral neural foraminal stenosis. C6-7: Left asymmetric disc bulge with endplate spurring. Mild spinal canal stenosis. Severe left neural foraminal stenosis. C7-T1: Normal disc space and facet joints. There is no spinal canal stenosis. No neural foraminal stenosis. MRI THORACIC SPINE FINDINGS Alignment:  Physiologic. Vertebrae: There is superior endplate signal abnormalities at T8 and T9, likely minimally depressed compression fractures. There is abnormal signal within the inferior half of T11. Chronic compression deformity of T12 with augmentation cement. There is contrast enhancement at T9, T8 and T11 but no visualized soft tissue component. Cord:  Normal signal and morphology. Paraspinal and other soft tissues: Negative. Disc levels: No spinal canal or neural foraminal stenosis. MRI LUMBAR SPINE FINDINGS Segmentation:  Standard. Alignment:  Physiologic. Vertebrae: Compression deformities at L1 and L3 with mild bone marrow edema. No soft tissue mass. Conus medullaris and cauda equina: Conus extends to the L1 level. Conus and cauda equina appear normal. Paraspinal and other soft tissues: Negative Disc levels: L1-L2: Normal disc space and facet joints. No spinal  canal stenosis. No neural foraminal stenosis. L2-L3: Small disc bulge with mild facet hypertrophy. No spinal canal stenosis. No neural foraminal stenosis. L3-L4: Right asymmetric disc bulge. Mild spinal canal stenosis. Severe narrowing of the right lateral recess and mild left lateral recess narrowing. Mild right neural foraminal stenosis. L4-L5: Small disc bulge with endplate spurring. Right lateral recess narrowing without central spinal canal stenosis. Mild right and moderate left neural foraminal stenosis. L5-S1: Normal disc space and facet joints. No spinal canal stenosis. No neural foraminal stenosis. Visualized sacrum: Normal. IMPRESSION: 1. Acute to subacute compression fractures of T8, T9, L1 and L3 with mild bone marrow edema and enhancement. There are no specific imaging features to indicate a neoplastic process; but, given history of myeloma, myelomatous involvement is probable. 2. 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 3. Mild C5-6 and C6-7 spinal canal stenosis. 4. Severe right lateral recess and mild left lateral recess narrowing at L3-4. 5. Moderate left L4-5 neural foraminal stenosis. Electronically Signed   By: Deatra Robinson M.D.   On: 01/26/2023 20:05   MR THORACIC SPINE W WO CONTRAST  Result Date: 01/26/2023 CLINICAL DATA:  Metastatic disease evaluation EXAM: MRI CERVICAL, THORACIC AND LUMBAR SPINE WITHOUT AND WITH CONTRAST TECHNIQUE: Multiplanar and multiecho pulse sequences of the cervical spine, to include the craniocervical junction and cervicothoracic junction, and thoracic and lumbar spine, were obtained without and with intravenous contrast. CONTRAST:  6mL GADAVIST GADOBUTROL 1 MMOL/ML IV SOLN COMPARISON:  None Available. FINDINGS: MRI CERVICAL SPINE FINDINGS Alignment: Grade 1 C4-5 anterolisthesis and grade 1 C5-6 retrolisthesis Vertebrae: No fracture, evidence of discitis, or bone lesion. Cord: Normal signal and morphology. Posterior Fossa, vertebral  arteries, paraspinal tissues: Negative. Disc levels: C1-2: Unremarkable. C2-3: Small disc bulge with right-greater-than-left uncovertebral hypertrophy. There is no spinal canal stenosis. Moderate right neural foraminal stenosis. C3-4: Small disc bulge with uncovertebral spurring and right-greater-than-left facet hypertrophy. There is no spinal canal stenosis. Severe right and mild left neural foraminal stenosis. C4-5: Severe right facet arthrosis. There is no spinal canal stenosis. Mild right neural foraminal stenosis. C5-6: Disc bulge with bilateral uncovertebral hypertrophy. Mild spinal canal stenosis. Severe bilateral neural foraminal stenosis. C6-7: Left asymmetric disc  bulge with endplate spurring. Mild spinal canal stenosis. Severe left neural foraminal stenosis. C7-T1: Normal disc space and facet joints. There is no spinal canal stenosis. No neural foraminal stenosis. MRI THORACIC SPINE FINDINGS Alignment:  Physiologic. Vertebrae: There is superior endplate signal abnormalities at T8 and T9, likely minimally depressed compression fractures. There is abnormal signal within the inferior half of T11. Chronic compression deformity of T12 with augmentation cement. There is contrast enhancement at T9, T8 and T11 but no visualized soft tissue component. Cord:  Normal signal and morphology. Paraspinal and other soft tissues: Negative. Disc levels: No spinal canal or neural foraminal stenosis. MRI LUMBAR SPINE FINDINGS Segmentation:  Standard. Alignment:  Physiologic. Vertebrae: Compression deformities at L1 and L3 with mild bone marrow edema. No soft tissue mass. Conus medullaris and cauda equina: Conus extends to the L1 level. Conus and cauda equina appear normal. Paraspinal and other soft tissues: Negative Disc levels: L1-L2: Normal disc space and facet joints. No spinal canal stenosis. No neural foraminal stenosis. L2-L3: Small disc bulge with mild facet hypertrophy. No spinal canal stenosis. No neural foraminal  stenosis. L3-L4: Right asymmetric disc bulge. Mild spinal canal stenosis. Severe narrowing of the right lateral recess and mild left lateral recess narrowing. Mild right neural foraminal stenosis. L4-L5: Small disc bulge with endplate spurring. Right lateral recess narrowing without central spinal canal stenosis. Mild right and moderate left neural foraminal stenosis. L5-S1: Normal disc space and facet joints. No spinal canal stenosis. No neural foraminal stenosis. Visualized sacrum: Normal. IMPRESSION: 1. Acute to subacute compression fractures of T8, T9, L1 and L3 with mild bone marrow edema and enhancement. There are no specific imaging features to indicate a neoplastic process; but, given history of myeloma, myelomatous involvement is probable. 2. 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 3. Mild C5-6 and C6-7 spinal canal stenosis. 4. Severe right lateral recess and mild left lateral recess narrowing at L3-4. 5. Moderate left L4-5 neural foraminal stenosis. Electronically Signed   By: Deatra Robinson M.D.   On: 01/26/2023 20:05   CT ABDOMEN PELVIS W CONTRAST  Result Date: 01/26/2023 CLINICAL DATA:  Abdominal pain.  Weakness. EXAM: CT ABDOMEN AND PELVIS WITH CONTRAST TECHNIQUE: Multidetector CT imaging of the abdomen and pelvis was performed using the standard protocol following bolus administration of intravenous contrast. RADIATION DOSE REDUCTION: This exam was performed according to the departmental dose-optimization program which includes automated exposure control, adjustment of the mA and/or kV according to patient size and/or use of iterative reconstruction technique. CONTRAST:  80mL OMNIPAQUE IOHEXOL 300 MG/ML  SOLN COMPARISON:  08/03/2021. FINDINGS: Lower chest: No acute findings. 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 Hepatobiliary: Liver normal in size. 2 cysts in segment 2, smaller than on the prior CT. Largest measures 2 cm. No  other liver masses or lesions. Status post cholecystectomy. No bile duct dilation. Pancreas: Unremarkable. No pancreatic ductal dilatation or surrounding inflammatory changes. Spleen: Normal in size without focal abnormality. Adrenals/Urinary Tract: Normal adrenal glands. Kidneys normal in size, orientation and position with symmetric enhancement and excretion. 1.8 cm posterior midpole right renal cyst, stable with no follow-up indicated. No other renal masses. Mild bilateral hydronephrosis and hydroureter. No renal or ureteral stones. Bladder is distended. No bladder wall thickening, mass or stone. Stomach/Bowel: Small hiatal hernia.  Stomach otherwise unremarkable. Small bowel and colon are normal in caliber. No wall thickening or inflammation. Mild to moderate increase in the colonic stool burden. Rectum moderately distended with stool. Vascular/Lymphatic: Aortic atherosclerosis.  No aneurysm. No enlarged lymph nodes. Reproductive: Unremarkable. Other: No ascites.  No hernia. Musculoskeletal: There are compression fractures of T12, L1 and L3, T12 previously treated with vertebroplasty. These fractures are new since the prior CT. Numerous small lucencies are seen throughout the visualized skeleton. Findings consistent with multiple myeloma. PET-CT performed on 12/25/2022 has an indication of initial treatment strategy for multiple myeloma. IMPRESSION: 1. There are vertebral fractures as detailed all new since the CT from 08/03/2021. Only the T12 fracture was evident on an MRI from 11/28/2022. The L1 and L3 fractures may be recent. There are underlying numerous small lucencies throughout the visualized skeleton consistent with multiple myeloma. 2. Significant bladder distension, which is the presumed cause of mild bilateral hydroureteronephrosis. 3. No other acute abnormality within the abdomen or pelvis. 4. Increased colonic stool burden and rectum moderately distended with stool. 5. Aortic atherosclerosis.  Electronically Signed   By: Amie Portland M.D.   On: 01/26/2023 16:41    Results are pending, will review when available.  Assessment and Plan:  * Acute urinary retention Patient presented with nausea and inability to urinate with marked urinary retention seen on CT imaging. Over 1.3L out once foley was placed. Concerning for spinal etiology given multiple fractures seen on imaging.  Differential also includes medication side effect as patient is on chronic opioids.   - MRI of total spine pending - Foley catheter placed; will likely need on discharge due to bladder stretch injury - Strict in and out - Monitor postobstructive diuresis  Vertebral fracture, pathological Patient presenting with increased lower back pain compared with chronic back pain with evidence of new L1 and L3 fractures and multiple previous compression fractures secondary to multiple myeloma.  Patient's pain has not been well-controlled with home oxycodone.  He has been prescribed morphine ER by oncology, however has been too nervous to start.  - Neurosurgery consulted; appreciate their recommendations - MRI total spine pending - Continue home oxycodone every 8 hours as needed - Start previously prescribed morphine ER 15 mg twice daily  Overflow diarrhea Likely multifactorial in the setting of opioid-induced constipation and bladder distention leading to possible obstruction.  Bladder has been decompressed at this time.  Given patient is currently on immunomodulators, will rule out infectious etiology, however unlikely.   - One-time soapsuds enema to try and break up stool ball - GI panel pending  Hyponatremia Likely due to poor p.o. intake and severe diarrhea over the last 24 hours.  History of chronic hyponatremia with baseline sodium around 129.  Currently 123.  - Repeat BMP pending - IV fluid resuscitation  Generalized weakness I suspect this is multifactorial but acutely worsened by GI fluid losses.  I  suspect this will improve after IV fluid resuscitation.  No focal weakness noted on examination.  Therapeutic opioid induced constipation Patient states that he continues to have difficulty with constipation despite taking senna, MiraLAX and lactulose.  He has tried Movantik in the past without improvement.  Given this is opioid-induced, Methylnaltrexone may be a good option to try while admitted. It may be beneficial to consider Amitiza as well in the future.   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  Revlimid hide has been on hold but he is still on daratumumab.  -Continue  outpatient follow-up with oncology  Hypothyroidism - Continue home Synthroid  Hypertension History of hypertension noted in chart, however patient is no longer on any antihypertensives.  Will continue to monitor while admitted.  Advance Care Planning:   Code Status: DNR/DNI.  Patient confirmed CODE STATUS with both his wife and son in the room.  Consults: Neurosurgery  Family Communication: Patient's wife and son updated at bedside  Severity of Illness: The appropriate patient status for this patient is OBSERVATION. Observation status is judged to be reasonable and necessary in order to provide the required intensity of service to ensure the patient's safety. The patient's presenting symptoms, physical exam findings, and initial radiographic and laboratory data in the context of their medical condition is felt to place them at decreased risk for further clinical deterioration. Furthermore, it is anticipated that the patient will be medically stable for discharge from the hospital within 2 midnights of admission.   Author: Verdene Lennert, MD 01/26/2023 8:39  PM  For on call review [email or web address].

## 2023-01-27 ENCOUNTER — Encounter: Payer: Self-pay | Admitting: Internal Medicine

## 2023-01-27 DIAGNOSIS — E871 Hypo-osmolality and hyponatremia: Secondary | ICD-10-CM | POA: Diagnosis not present

## 2023-01-27 DIAGNOSIS — Z515 Encounter for palliative care: Secondary | ICD-10-CM | POA: Diagnosis not present

## 2023-01-27 DIAGNOSIS — R197 Diarrhea, unspecified: Secondary | ICD-10-CM | POA: Diagnosis not present

## 2023-01-27 DIAGNOSIS — R338 Other retention of urine: Secondary | ICD-10-CM | POA: Diagnosis not present

## 2023-01-27 DIAGNOSIS — M8458XA Pathological fracture in neoplastic disease, other specified site, initial encounter for fracture: Secondary | ICD-10-CM | POA: Diagnosis not present

## 2023-01-27 LAB — CBC
HCT: 29.2 % — ABNORMAL LOW (ref 39.0–52.0)
Hemoglobin: 10.3 g/dL — ABNORMAL LOW (ref 13.0–17.0)
MCH: 35.4 pg — ABNORMAL HIGH (ref 26.0–34.0)
MCHC: 35.3 g/dL (ref 30.0–36.0)
MCV: 100.3 fL — ABNORMAL HIGH (ref 80.0–100.0)
Platelets: 170 10*3/uL (ref 150–400)
RBC: 2.91 MIL/uL — ABNORMAL LOW (ref 4.22–5.81)
RDW: 17 % — ABNORMAL HIGH (ref 11.5–15.5)
WBC: 3.7 10*3/uL — ABNORMAL LOW (ref 4.0–10.5)
nRBC: 0 % (ref 0.0–0.2)

## 2023-01-27 LAB — GASTROINTESTINAL PANEL BY PCR, STOOL (REPLACES STOOL CULTURE)

## 2023-01-27 LAB — BASIC METABOLIC PANEL
Anion gap: 6 (ref 5–15)
BUN: 12 mg/dL (ref 8–23)
CO2: 24 mmol/L (ref 22–32)
Calcium: 8.2 mg/dL — ABNORMAL LOW (ref 8.9–10.3)
Chloride: 99 mmol/L (ref 98–111)
Creatinine, Ser: 0.9 mg/dL (ref 0.61–1.24)
GFR, Estimated: >60 mL/min (ref >60)
Glucose, Bld: 96 mg/dL (ref 70–99)
Potassium: 3.8 mmol/L (ref 3.5–5.1)
Sodium: 129 mmol/L — ABNORMAL LOW (ref 135–145)

## 2023-01-27 LAB — C DIFFICILE QUICK SCREEN W PCR REFLEX
C Diff antigen: NEGATIVE
C Diff interpretation: NOT DETECTED
C Diff toxin: NEGATIVE

## 2023-01-27 LAB — MAGNESIUM: Magnesium: 2.6 mg/dL — ABNORMAL HIGH (ref 1.7–2.4)

## 2023-01-27 MED ORDER — POLYETHYLENE GLYCOL 3350 17 G PO PACK
17.0000 g | PACK | Freq: Every day | ORAL | Status: DC
  Administered 2023-01-27 – 2023-01-28 (×2): 17 g via ORAL
  Filled 2023-01-27 (×2): qty 1

## 2023-01-27 MED ORDER — CHLORHEXIDINE GLUCONATE CLOTH 2 % EX PADS
6.0000 | MEDICATED_PAD | Freq: Every day | CUTANEOUS | Status: DC
  Administered 2023-01-27 – 2023-01-28 (×2): 6 via TOPICAL

## 2023-01-27 MED ORDER — SENNOSIDES-DOCUSATE SODIUM 8.6-50 MG PO TABS
2.0000 | ORAL_TABLET | Freq: Two times a day (BID) | ORAL | Status: DC
  Administered 2023-01-27 – 2023-01-28 (×3): 2 via ORAL
  Filled 2023-01-27 (×3): qty 2

## 2023-01-27 NOTE — Progress Notes (Signed)
Initial Nutrition Assessment  DOCUMENTATION CODES:   Not applicable  INTERVENTION:   -MVI with minerals daily -Ensure Enlive po BID, each supplement provides 350 kcal and 20 grams of protein  NUTRITION DIAGNOSIS:   Increased nutrient needs related to chronic illness (multiple myeloma) as evidenced by estimated needs.  GOAL:   Patient will meet greater than or equal to 90% of their needs  MONITOR:   PO intake, Supplement acceptance  REASON FOR ASSESSMENT:   Malnutrition Screening Tool    ASSESSMENT:   Pt with medical history significant of multiple myeloma stage II currently on on daratumumab and Revlimid and s/p recent radiation therapy, multiple pathologic spinal fractures s/p kyphoplasty, hypertension, hyperlipidemia, Mnire disease, normocytic anemia, anxiety disorder on Xanax, who presents due to worsening weakness, nausea, vomiting and diarrhea.  Pt admitted with acute urinary retention, vertebral fracture, and multiple myeloma.   Reviewed I/O's: -1.3 L x 24 hours  UOP: 1.8 L x 24 hours  Per neurosurgery, compression fractures of lumbar vertebrae are nonsurgical in nature.   Pt working with therapy at time of visit. RD unable to obtain further nutrition-related history or complete nutrition-focused physical exam at this time.    Pt on a regular diet. No meal completions data available to assess at this time.   Reviewed wt hx; pt has experienced a 7% wt loss over the past month, which is significant for time frame.   Highly suspect malnutrition, however, unable to identify at this time. Pt would greatly benefit form addition of oral nutrition supplements.   Per palliative care notes, pt desires full scope treatment currently.   Medications reviewed and include marinol, MS contin, miralax, and senokot.   Labs reviewed: Na: 129, CBGS: 102 (inpatient orders for glycemic control are none).    Diet Order:   Diet Order             Diet regular Room service  appropriate? Yes; Fluid consistency: Thin  Diet effective now                   EDUCATION NEEDS:   No education needs have been identified at this time  Skin:  Skin Assessment: Reviewed RN Assessment  Last BM:  01/27/23 (type 6)  Height:   Ht Readings from Last 1 Encounters:  01/26/23 5\' 11"  (1.803 m)    Weight:   Wt Readings from Last 1 Encounters:  01/26/23 61.2 kg    Ideal Body Weight:  78.2 kg  BMI:  Body mass index is 18.83 kg/m.  Estimated Nutritional Needs:   Kcal:  1800-2000  Protein:  90-105 grams  Fluid:  > 1.8 L    Levada Schilling, RD, LDN, CDCES Registered Dietitian II Certified Diabetes Care and Education Specialist Please refer to [facility] for RD and/or RD on-call/weekend/after hours pager

## 2023-01-27 NOTE — Evaluation (Addendum)
Occupational Therapy Evaluation Patient Details Name: Melvin Gilmore MRN: [id number] DOB: 06/16/1941 Today's Date: 01/27/2023   History of Present Illness presented to ER secondary to progressive weakness, nausea/vomiting; admitted for management of acute urinary retention, pathological vertebral fracture   Clinical Impression   Mr. Tough was seen for OT/PT co-evaluation this date. Prior to hospital admission, pt required at least some assistance from his spouse with bathing and LB dressing. He endorses using a 4WW for functional mobility and denies falls history in the last 6 months. Pt reports decreased independence with functional activity over the last several weeks. Pt presents to acute OT demonstrating impaired ADL performance and functional mobility 2/2 increased back pain with mobility, decreased LB access, and decreased activity tolerance. (See OT problem list for additional functional deficits). Pt currently requires SUPERVISION for functional mobility, and MIN-MOD A for LB ADL management from STS. Pt would benefit from skilled OT services to address noted impairments and functional limitations (see below for any additional details) in order to maximize safety and independence while minimizing falls risk and caregiver burden. Do not anticipate the need for follow up OT services upon acute hospital DC.       Recommendations for follow up therapy are one component of a multi-disciplinary discharge planning process, led by the attending physician.  Recommendations may be updated based on patient status, additional functional criteria and insurance authorization.   Assistance Recommended at Discharge    Patient can return home with the following A little help with walking and/or transfers;A little help with bathing/dressing/bathroom;Help with stairs or ramp for entrance;Assist for transportation;Assistance with cooking/housework    Functional Status Assessment  Patient has had a recent  decline in their functional status and demonstrates the ability to make significant improvements in function in a reasonable and predictable amount of time.  Equipment Recommendations  None recommended by OT (Pt has necessary equipment)    Recommendations for Other Services       Precautions / Restrictions Precautions Precautions: Fall Restrictions Weight Bearing Restrictions: No      Mobility Bed Mobility Overal bed mobility: Modified Independent                  Transfers Overall transfer level: Needs assistance Equipment used: Rolling walker (2 wheels) Transfers: Sit to/from Stand Sit to Stand: Min guard, Min assist           General transfer comment: prefers to place bilat hands on thighs when transitioning (baseline/preferred movement pattern)      Balance Overall balance assessment: Needs assistance Sitting-balance support: No upper extremity supported, Feet supported Sitting balance-Leahy Scale: Good     Standing balance support: Bilateral upper extremity supported Standing balance-Leahy Scale: Fair                             ADL either performed or assessed with clinical judgement   ADL Overall ADL's : Needs assistance/impaired                                     Functional mobility during ADLs: Supervision/safety;Rolling walker (2 wheels) General ADL Comments: Pt requires SUPERVISION for safety with functional transfers. Requires intermittent cueing for safety. He is limited by increased back pain with mobility but able to adjust bilat socks at bed level independently. Anticipate increased assist for LB dressing from STS.     Vision  Patient Visual Report: No change from baseline       Perception     Praxis      Pertinent Vitals/Pain Pain Assessment Pain Assessment: 0-10 Pain Score: 5  Pain Location: back Pain Descriptors / Indicators: Aching, Guarding Pain Intervention(s): Limited activity within patient's  tolerance, Monitored during session, Repositioned     Hand Dominance Right   Extremity/Trunk Assessment Upper Extremity Assessment Upper Extremity Assessment: Overall WFL for tasks assessed (No focal deficits appreciated. AROM WFL.)   Lower Extremity Assessment Lower Extremity Assessment: Overall WFL for tasks assessed   Cervical / Trunk Assessment Cervical / Trunk Assessment: Other exceptions Cervical / Trunk Exceptions: multiple spinal fxs   Communication Communication Communication: HOH (hears best from L ear)   Cognition Arousal/Alertness: Awake/alert Behavior During Therapy: WFL for tasks assessed/performed Overall Cognitive Status: Within Functional Limits for tasks assessed                                       General Comments       Exercises Other Exercises Other Exercises: Pt educated on role of OT in acute setting, importance of functional mobility during hospital stay, and DC recs.   Shoulder Instructions      Home Living Family/patient expects to be discharged to:: Private residence Living Arrangements: Spouse/significant other;Children Available Help at Discharge: Family;Available 24 hours/day Type of Home: House       Home Layout: Two level Alternate Level Stairs-Number of Steps: flight (3-landing-8 to 10 more) Alternate Level Stairs-Rails: Left Bathroom Shower/Tub: Walk-in shower         Home Equipment: Shower seat - built in;Grab bars - tub/shower;Rollator (4 wheels);Grab bars - toilet          Prior Functioning/Environment Prior Level of Function : Needs assist       Physical Assist : ADLs (physical)   ADLs (physical): Dressing;Bathing Mobility Comments: Using [phone] for functional mobility recently; denies fall history ADLs Comments: Wife assists with bathing and dressing. Reports decreased independence with self care over last few weeks.        OT Problem List: Decreased activity tolerance;Decreased safety  awareness;Pain;Impaired balance (sitting and/or standing);Decreased knowledge of use of DME or AE      OT Treatment/Interventions: Self-care/ADL training;Therapeutic exercise;Therapeutic activities;DME and/or AE instruction;Patient/family education;Balance training;Energy conservation    OT Goals(Current goals can be found in the care plan section) Acute Rehab OT Goals Patient Stated Goal: To feel better OT Goal Formulation: With patient Time For Goal Achievement: 02/10/23 Potential to Achieve Goals: Good ADL Goals Pt Will Perform Grooming: with modified independence;with adaptive equipment;standing Pt Will Perform Lower Body Dressing: sit to/from stand;with min assist;with caregiver independent in assisting;with adaptive equipment Pt Will Transfer to Toilet: bedside commode;ambulating;with modified independence Pt Will Perform Toileting - Clothing Manipulation and hygiene: sit to/from stand;with supervision;with adaptive equipment;with caregiver independent in assisting  OT Frequency: Min 1X/week    Co-evaluation   Reason for Co-Treatment: For patient/therapist safety PT goals addressed during session: Mobility/safety with mobility OT goals addressed during session: ADL's and self-care      AM-PAC OT "6 Clicks" Daily Activity     Outcome Measure Help from another person eating meals?: None Help from another person taking care of personal grooming?: A Little Help from another person toileting, which includes using toliet, bedpan, or urinal?: A Little Help from another person bathing (including washing, rinsing, drying)?: A Little Help from  another person to put on and taking off regular upper body clothing?: A Little Help from another person to put on and taking off regular lower body clothing?: A Little 6 Click Score: 19   End of Session Equipment Utilized During Treatment: Gait belt;Rolling walker (2 wheels) Nurse Communication: Mobility status  Activity Tolerance: Patient  tolerated treatment well Patient left: in chair;with call bell/phone within reach;with chair alarm set  OT Visit Diagnosis: Other abnormalities of gait and mobility (R26.89);Pain Pain - Right/Left:  (both) Pain - part of body:  (back)                Time: 7846-9629 OT Time Calculation (min): 18 min Charges:  OT General Charges $OT Visit: 1 Visit OT Evaluation $OT Eval Low Complexity: 1 Low  Rockney Ghee, M.S., OTR/L 01/27/23, 3:46 PM

## 2023-01-27 NOTE — Plan of Care (Signed)
  Problem: Education: Goal: Knowledge of General Education information will improve Description: Including pain rating scale, medication(s)/side effects and non-pharmacologic comfort measures Outcome: Progressing   Problem: Health Behavior/Discharge Planning: Goal: Ability to manage health-related needs will improve Outcome: Progressing   Problem: Clinical Measurements: Goal: Diagnostic test results will improve Outcome: Progressing   Problem: Activity: Goal: Risk for activity intolerance will decrease Outcome: Progressing   Problem: Nutrition: Goal: Adequate nutrition will be maintained Outcome: Progressing   Problem: Elimination: Goal: Will not experience complications related to bowel motility Outcome: Progressing   Problem: Pain Managment: Goal: General experience of comfort will improve Outcome: Progressing   

## 2023-01-27 NOTE — Consult Note (Signed)
Palliative Medicine [facility] at [facility] [phone] [phone]   Name: Melvin Gilmore Date: 01/27/2023 MRN: [id number]  DOB: [birth year]  Patient Care Team: [name], MD as PCP - General (Internal Medicine)    REASON FOR CONSULTATION: Melvin Gilmore is a 82 y.o. male with multiple medical problems including history of prostate cancer status post HIFU, now with multiple myeloma. MRI thoracic and lumbar spine showed a subacute compression fracture of T12 and focal enhancing marrow replacing lesions in T2, T5, T6, T8, and L1.  Patient hospitalized 01/26/2023 with weakness, nausea, vomiting, diarrhea.  Found to have urinary retention requiring Foley.  Palliative Care consulted to address goals and manage ongoing symptoms.   SOCIAL HISTORY:     reports that he has never smoked. He has never used smokeless tobacco. He reports current alcohol use of about 7.0 standard drinks of alcohol per week. He reports that he does not use drugs.  Patient is married lives at home with his wife  ADVANCE DIRECTIVES:  Not on file  CODE STATUS: DNR  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   Nausea vomiting and diarrhea 01/26/2023   Prostate cancer (HCC) 2023   Seasonal allergies    Skin cancer    a.) ears   Wears partial dentures    lower    PAST SURGICAL HISTORY:  Past Surgical History:  Procedure Laterality Date   APPENDECTOMY     CATARACT EXTRACTION W/PHACO Right 01/15/2016    Procedure: CATARACT EXTRACTION PHACO AND INTRAOCULAR LENS PLACEMENT (IOC);  Surgeon: Sherald Hess, MD;  Location: [facility];  Service: Ophthalmology;  Laterality: Right;  RIGHT CALL CELL PHONE WITH TIME   CHOLECYSTECTOMY     COLONOSCOPY     COLONOSCOPY WITH ESOPHAGOGASTRODUODENOSCOPY (EGD)     ESOPHAGOGASTRODUODENOSCOPY  07/2021   ESOPHAGOGASTRODUODENOSCOPY (EGD) WITH PROPOFOL N/A 09/23/2017   Procedure: ESOPHAGOGASTRODUODENOSCOPY (EGD) WITH PROPOFOL;  Surgeon: Toledo, Boykin Nearing, MD;  Location: [facility];  Service: Gastroenterology;  Laterality: N/A;   HIGH INTENSITY FOCUSED ULTRASOUND (HIFU) OF THE PROSTATE N/A 02/07/2022   Procedure: HIGH INTENSITY FOCUSED ULTRASOUND (HIFU) OF THE PROSTATE;  Surgeon: Orson Ape, MD;  Location: [facility];  Service: Urology;  Laterality: N/A;   HYDROCELE EXCISION / REPAIR     IR KYPHO THORACIC WITH BONE BIOPSY  12/06/2022   IR RADIOLOGIST EVAL & MGMT  11/21/2022   LASIX RT EYE     PROSTATE BIOPSY N/A 01/03/2022   Procedure: PROSTATE BIOPSY  Addison Dail;  Surgeon: Orson Ape, MD;  Location: [facility];  Service: Urology;  Laterality: N/A;   ROTATOR CUFF REPAIR Left 2001    HEMATOLOGY/ONCOLOGY HISTORY:  Oncology History  Multiple myeloma (HCC)  12/05/2022 Cancer Staging   Staging form: Plasma Cell Myeloma and Plasma Cell Disorders, AJCC 8th Edition - Clinical stage from 12/05/2022: RISS Stage II (Beta-2-microglobulin (mg/L): 3.7, Albumin (g/dL): 3.6, ISS: Stage  II, High-risk cytogenetics: Absent, LDH: Normal) - Signed by [name], MD on 12/26/2022 Stage prefix: Initial diagnosis Beta 2 microglobulin range (mg/L): 3.5 to 5.49 Albumin range (g/dL): Greater than or equal to 3.5 Cytogenetics: 1q addition, Other mutation   12/13/2022 Initial Diagnosis   Multiple myeloma (HCC)   12/26/2022 -  Chemotherapy   Patient is on Treatment Plan : MYELOMA NEWLY DIAGNOSED Daratumumab IV + Lenalidomide + Dexamethasone Weekly (DaraRd) q28d        ALLERGIES:  is allergic to amoxicillin-pot clavulanate and meloxicam.  MEDICATIONS:  Current Facility-Administered Medications  Medication Dose Route Frequency Provider Last Rate Last Admin   acyclovir (ZOVIRAX) 200 MG capsule 400 mg  400 mg Oral BID Verdene Lennert, MD   400 mg at 01/27/23 0931   ALPRAZolam (XANAX) tablet 0.25 mg  0.25 mg Oral QHS PRN Verdene Lennert, MD       aspirin EC tablet 81 mg  81 mg Oral Daily Verdene Lennert, MD   81 mg at 01/27/23 0932   Chlorhexidine Gluconate Cloth 2 % PADS 6 each  6 each Topical Daily Delfino Lovett, MD   6 each at 01/27/23 1240   dronabinol (MARINOL) capsule 2.5 mg  2.5 mg Oral Daily Verdene Lennert, MD   2.5 mg at 01/27/23 0932   enoxaparin (LOVENOX) injection 40 mg  40 mg Subcutaneous Q24H Verdene Lennert, MD   40 mg at 01/26/23 2109   feeding supplement (ENSURE ENLIVE / ENSURE PLUS) liquid 237 mL  237 mL Oral BID BM Verdene Lennert, MD   237 mL at 01/27/23 1448   gabapentin (NEURONTIN) capsule 100 mg  100 mg Oral Daily PRN Verdene Lennert, MD       levothyroxine (SYNTHROID) tablet 50 mcg  50 mcg Oral QAC breakfast Verdene Lennert, MD   50 mcg at 01/27/23 1610   morphine (MS CONTIN) 12 hr tablet 15 mg  15 mg Oral Q12H Verdene Lennert, MD   15 mg at 01/27/23 0932   multivitamin with minerals tablet 1 tablet  1 tablet Oral Daily Verdene Lennert, MD   1 tablet at 01/27/23 0931   ondansetron (ZOFRAN) tablet 4 mg  4 mg Oral Q6H PRN Verdene Lennert, MD       Or   ondansetron (ZOFRAN) injection 4 mg  4 mg Intravenous Q6H PRN Verdene Lennert, MD       oxyCODONE (Oxy IR/ROXICODONE) immediate release tablet 5 mg  5 mg Oral Q6H PRN Verdene Lennert, MD       pantoprazole (PROTONIX) EC tablet 40 mg  40 mg Oral Daily Verdene Lennert, MD   40 mg at 01/27/23 0932   polyethylene glycol (MIRALAX / GLYCOLAX) packet 17 g  17 g Oral Daily Delfino Lovett, MD   17 g at 01/27/23 1239   senna-docusate (Senokot-S) tablet 2 tablet  2 tablet Oral BID Delfino Lovett, MD   2  tablet at 01/27/23 1240   sodium chloride flush (NS) 0.9 % injection 3 mL  3 mL Intravenous Q12H Verdene Lennert, MD   3 mL at 01/27/23 0934    VITAL SIGNS: BP 136/60   Pulse 77   Temp 99.5 F (37.5 C)   Resp 17   Ht 5\' 11"  (1.803 m)   Wt 135 lb (61.2 kg)   SpO2 98%   BMI 18.83 kg/m  Filed Weights   01/26/23 1219  Weight: 135 lb (61.2 kg)    Estimated body mass index is 18.83 kg/m as calculated from the following:  Height as of this encounter: 5\' 11"  (1.803 m).   Weight as of this encounter: 135 lb (61.2 kg).  LABS: CBC:    Component Value Date/Time   WBC 3.7 (L) 01/27/2023 0625   HGB 10.3 (L) 01/27/2023 0625   HGB 11.2 (L) 01/24/2023 0901   HCT 29.2 (L) 01/27/2023 0625   PLT 170 01/27/2023 0625   PLT 173 01/24/2023 0901   MCV 100.3 (H) 01/27/2023 0625   NEUTROABS 1.1 (L) 01/24/2023 0901   LYMPHSABS 0.6 (L) 01/24/2023 0901   MONOABS 0.3 01/24/2023 0901   EOSABS 0.0 01/24/2023 0901   BASOSABS 0.0 01/24/2023 0901   Comprehensive Metabolic Panel:    Component Value Date/Time   NA 129 (L) 01/27/2023 0625   K 3.8 01/27/2023 0625   CL 99 01/27/2023 0625   CO2 24 01/27/2023 0625   BUN 12 01/27/2023 0625   CREATININE 0.90 01/27/2023 0625   GLUCOSE 96 01/27/2023 0625   CALCIUM 8.2 (L) 01/27/2023 0625   AST 23 01/26/2023 1221   ALT 28 01/26/2023 1221   ALKPHOS 127 (H) 01/26/2023 1221   BILITOT 1.4 (H) 01/26/2023 1221   PROT 6.2 (L) 01/26/2023 1221   ALBUMIN 3.7 01/26/2023 1221    RADIOGRAPHIC STUDIES: MR CERVICAL SPINE W WO CONTRAST  Result Date: 01/26/2023 CLINICAL DATA:  Metastatic disease evaluation EXAM: MRI CERVICAL, THORACIC AND LUMBAR SPINE WITHOUT AND WITH CONTRAST TECHNIQUE: Multiplanar and multiecho pulse sequences of the cervical spine, to include the craniocervical junction and cervicothoracic junction, and thoracic and lumbar spine, were obtained without and with intravenous contrast. CONTRAST:  6mL GADAVIST GADOBUTROL 1 MMOL/ML IV SOLN COMPARISON:   None Available. FINDINGS: MRI CERVICAL SPINE FINDINGS Alignment: Grade 1 C4-5 anterolisthesis and grade 1 C5-6 retrolisthesis Vertebrae: No fracture, evidence of discitis, or bone lesion. Cord: Normal signal and morphology. Posterior Fossa, vertebral arteries, paraspinal tissues: Negative. Disc levels: C1-2: Unremarkable. C2-3: Small disc bulge with right-greater-than-left uncovertebral hypertrophy. There is no spinal canal stenosis. Moderate right neural foraminal stenosis. C3-4: Small disc bulge with uncovertebral spurring and right-greater-than-left facet hypertrophy. There is no spinal canal stenosis. Severe right and mild left neural foraminal stenosis. C4-5: Severe right facet arthrosis. There is no spinal canal stenosis. Mild right neural foraminal stenosis. C5-6: Disc bulge with bilateral uncovertebral hypertrophy. Mild spinal canal stenosis. Severe bilateral neural foraminal stenosis. C6-7: Left asymmetric disc bulge with endplate spurring. Mild spinal canal stenosis. Severe left neural foraminal stenosis. C7-T1: Normal disc space and facet joints. There is no spinal canal stenosis. No neural foraminal stenosis. MRI THORACIC SPINE FINDINGS Alignment:  Physiologic. Vertebrae: There is superior endplate signal abnormalities at T8 and T9, likely minimally depressed compression fractures. There is abnormal signal within the inferior half of T11. Chronic compression deformity of T12 with augmentation cement. There is contrast enhancement at T9, T8 and T11 but no visualized soft tissue component. Cord:  Normal signal and morphology. Paraspinal and other soft tissues: Negative. Disc levels: No spinal canal or neural foraminal stenosis. MRI LUMBAR SPINE FINDINGS Segmentation:  Standard. Alignment:  Physiologic. Vertebrae: Compression deformities at L1 and L3 with mild bone marrow edema. No soft tissue mass. Conus medullaris and cauda equina: Conus extends to the L1 level. Conus and cauda equina appear normal.  Paraspinal and other soft tissues: Negative Disc levels: L1-L2: Normal disc space and facet joints. No spinal canal stenosis. No neural foraminal stenosis. L2-L3: Small disc bulge with mild facet hypertrophy. No spinal canal stenosis. No neural foraminal stenosis. L3-L4: Right asymmetric disc bulge. Mild  spinal canal stenosis. Severe narrowing of the right lateral recess and mild left lateral recess narrowing. Mild right neural foraminal stenosis. L4-L5: Small disc bulge with endplate spurring. Right lateral recess narrowing without central spinal canal stenosis. Mild right and moderate left neural foraminal stenosis. L5-S1: Normal disc space and facet joints. No spinal canal stenosis. No neural foraminal stenosis. Visualized sacrum: Normal. IMPRESSION: 1. Acute to subacute compression fractures of T8, T9, L1 and L3 with mild bone marrow edema and enhancement. There are no specific imaging features to indicate a neoplastic process; but, given history of myeloma, myelomatous involvement is probable. 2. 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 3. Mild C5-6 and C6-7 spinal canal stenosis. 4. Severe right lateral recess and mild left lateral recess narrowing at L3-4. 5. Moderate left L4-5 neural foraminal stenosis. Electronically Signed   By: Deatra Robinson M.D.   On: 01/26/2023 20:05   MR Lumbar Spine W Wo Contrast  Result Date: 01/26/2023 CLINICAL DATA:  Metastatic disease evaluation EXAM: MRI CERVICAL, THORACIC AND LUMBAR SPINE WITHOUT AND WITH CONTRAST TECHNIQUE: Multiplanar and multiecho pulse sequences of the cervical spine, to include the craniocervical junction and cervicothoracic junction, and thoracic and lumbar spine, were obtained without and with intravenous contrast. CONTRAST:  6mL GADAVIST GADOBUTROL 1 MMOL/ML IV SOLN COMPARISON:  None Available. FINDINGS: MRI CERVICAL SPINE FINDINGS Alignment: Grade 1 C4-5 anterolisthesis and grade 1 C5-6 retrolisthesis Vertebrae: No fracture,  evidence of discitis, or bone lesion. Cord: Normal signal and morphology. Posterior Fossa, vertebral arteries, paraspinal tissues: Negative. Disc levels: C1-2: Unremarkable. C2-3: Small disc bulge with right-greater-than-left uncovertebral hypertrophy. There is no spinal canal stenosis. Moderate right neural foraminal stenosis. C3-4: Small disc bulge with uncovertebral spurring and right-greater-than-left facet hypertrophy. There is no spinal canal stenosis. Severe right and mild left neural foraminal stenosis. C4-5: Severe right facet arthrosis. There is no spinal canal stenosis. Mild right neural foraminal stenosis. C5-6: Disc bulge with bilateral uncovertebral hypertrophy. Mild spinal canal stenosis. Severe bilateral neural foraminal stenosis. C6-7: Left asymmetric disc bulge with endplate spurring. Mild spinal canal stenosis. Severe left neural foraminal stenosis. C7-T1: Normal disc space and facet joints. There is no spinal canal stenosis. No neural foraminal stenosis. MRI THORACIC SPINE FINDINGS Alignment:  Physiologic. Vertebrae: There is superior endplate signal abnormalities at T8 and T9, likely minimally depressed compression fractures. There is abnormal signal within the inferior half of T11. Chronic compression deformity of T12 with augmentation cement. There is contrast enhancement at T9, T8 and T11 but no visualized soft tissue component. Cord:  Normal signal and morphology. Paraspinal and other soft tissues: Negative. Disc levels: No spinal canal or neural foraminal stenosis. MRI LUMBAR SPINE FINDINGS Segmentation:  Standard. Alignment:  Physiologic. Vertebrae: Compression deformities at L1 and L3 with mild bone marrow edema. No soft tissue mass. Conus medullaris and cauda equina: Conus extends to the L1 level. Conus and cauda equina appear normal. Paraspinal and other soft tissues: Negative Disc levels: L1-L2: Normal disc space and facet joints. No spinal canal stenosis. No neural foraminal stenosis.  L2-L3: Small disc bulge with mild facet hypertrophy. No spinal canal stenosis. No neural foraminal stenosis. L3-L4: Right asymmetric disc bulge. Mild spinal canal stenosis. Severe narrowing of the right lateral recess and mild left lateral recess narrowing. Mild right neural foraminal stenosis. L4-L5: Small disc bulge with endplate spurring. Right lateral recess narrowing without central spinal canal stenosis. Mild right and moderate left neural foraminal stenosis. L5-S1: Normal disc space and facet joints. No spinal canal stenosis. No neural foraminal  stenosis. Visualized sacrum: Normal. IMPRESSION: 1. Acute to subacute compression fractures of T8, T9, L1 and L3 with mild bone marrow edema and enhancement. There are no specific imaging features to indicate a neoplastic process; but, given history of myeloma, myelomatous involvement is probable. 2. 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 3. Mild C5-6 and C6-7 spinal canal stenosis. 4. Severe right lateral recess and mild left lateral recess narrowing at L3-4. 5. Moderate left L4-5 neural foraminal stenosis. Electronically Signed   By: Deatra Robinson M.D.   On: 01/26/2023 20:05   MR THORACIC SPINE W WO CONTRAST  Result Date: 01/26/2023 CLINICAL DATA:  Metastatic disease evaluation EXAM: MRI CERVICAL, THORACIC AND LUMBAR SPINE WITHOUT AND WITH CONTRAST TECHNIQUE: Multiplanar and multiecho pulse sequences of the cervical spine, to include the craniocervical junction and cervicothoracic junction, and thoracic and lumbar spine, were obtained without and with intravenous contrast. CONTRAST:  6mL GADAVIST GADOBUTROL 1 MMOL/ML IV SOLN COMPARISON:  None Available. FINDINGS: MRI CERVICAL SPINE FINDINGS Alignment: Grade 1 C4-5 anterolisthesis and grade 1 C5-6 retrolisthesis Vertebrae: No fracture, evidence of discitis, or bone lesion. Cord: Normal signal and morphology. Posterior Fossa, vertebral arteries, paraspinal tissues: Negative. Disc levels:  C1-2: Unremarkable. C2-3: Small disc bulge with right-greater-than-left uncovertebral hypertrophy. There is no spinal canal stenosis. Moderate right neural foraminal stenosis. C3-4: Small disc bulge with uncovertebral spurring and right-greater-than-left facet hypertrophy. There is no spinal canal stenosis. Severe right and mild left neural foraminal stenosis. C4-5: Severe right facet arthrosis. There is no spinal canal stenosis. Mild right neural foraminal stenosis. C5-6: Disc bulge with bilateral uncovertebral hypertrophy. Mild spinal canal stenosis. Severe bilateral neural foraminal stenosis. C6-7: Left asymmetric disc bulge with endplate spurring. Mild spinal canal stenosis. Severe left neural foraminal stenosis. C7-T1: Normal disc space and facet joints. There is no spinal canal stenosis. No neural foraminal stenosis. MRI THORACIC SPINE FINDINGS Alignment:  Physiologic. Vertebrae: There is superior endplate signal abnormalities at T8 and T9, likely minimally depressed compression fractures. There is abnormal signal within the inferior half of T11. Chronic compression deformity of T12 with augmentation cement. There is contrast enhancement at T9, T8 and T11 but no visualized soft tissue component. Cord:  Normal signal and morphology. Paraspinal and other soft tissues: Negative. Disc levels: No spinal canal or neural foraminal stenosis. MRI LUMBAR SPINE FINDINGS Segmentation:  Standard. Alignment:  Physiologic. Vertebrae: Compression deformities at L1 and L3 with mild bone marrow edema. No soft tissue mass. Conus medullaris and cauda equina: Conus extends to the L1 level. Conus and cauda equina appear normal. Paraspinal and other soft tissues: Negative Disc levels: L1-L2: Normal disc space and facet joints. No spinal canal stenosis. No neural foraminal stenosis. L2-L3: Small disc bulge with mild facet hypertrophy. No spinal canal stenosis. No neural foraminal stenosis. L3-L4: Right asymmetric disc bulge. Mild  spinal canal stenosis. Severe narrowing of the right lateral recess and mild left lateral recess narrowing. Mild right neural foraminal stenosis. L4-L5: Small disc bulge with endplate spurring. Right lateral recess narrowing without central spinal canal stenosis. Mild right and moderate left neural foraminal stenosis. L5-S1: Normal disc space and facet joints. No spinal canal stenosis. No neural foraminal stenosis. Visualized sacrum: Normal. IMPRESSION: 1. Acute to subacute compression fractures of T8, T9, L1 and L3 with mild bone marrow edema and enhancement. There are no specific imaging features to indicate a neoplastic process; but, given history of myeloma, myelomatous involvement is probable. 2. 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 3. Mild C5-6 and C6-7 spinal canal stenosis.  4. Severe right lateral recess and mild left lateral recess narrowing at L3-4. 5. Moderate left L4-5 neural foraminal stenosis. Electronically Signed   By: Deatra Robinson M.D.   On: 01/26/2023 20:05   CT ABDOMEN PELVIS W CONTRAST  Result Date: 01/26/2023 CLINICAL DATA:  Abdominal pain.  Weakness. EXAM: CT ABDOMEN AND PELVIS WITH CONTRAST TECHNIQUE: Multidetector CT imaging of the abdomen and pelvis was performed using the standard protocol following bolus administration of intravenous contrast. RADIATION DOSE REDUCTION: This exam was performed according to the departmental dose-optimization program which includes automated exposure control, adjustment of the mA and/or kV according to patient size and/or use of iterative reconstruction technique. CONTRAST:  80mL OMNIPAQUE IOHEXOL 300 MG/ML  SOLN COMPARISON:  08/03/2021. FINDINGS: Lower chest: No acute findings. 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 Hepatobiliary: Liver normal in size. 2 cysts in segment 2, smaller than on the prior CT. Largest measures 2 cm. No other liver masses or lesions. Status post  cholecystectomy. No bile duct dilation. Pancreas: Unremarkable. No pancreatic ductal dilatation or surrounding inflammatory changes. Spleen: Normal in size without focal abnormality. Adrenals/Urinary Tract: Normal adrenal glands. Kidneys normal in size, orientation and position with symmetric enhancement and excretion. 1.8 cm posterior midpole right renal cyst, stable with no follow-up indicated. No other renal masses. Mild bilateral hydronephrosis and hydroureter. No renal or ureteral stones. Bladder is distended. No bladder wall thickening, mass or stone. Stomach/Bowel: Small hiatal hernia.  Stomach otherwise unremarkable. Small bowel and colon are normal in caliber. No wall thickening or inflammation. Mild to moderate increase in the colonic stool burden. Rectum moderately distended with stool. Vascular/Lymphatic: Aortic atherosclerosis. No aneurysm. No enlarged lymph nodes. Reproductive: Unremarkable. Other: No ascites.  No hernia. Musculoskeletal: There are compression fractures of T12, L1 and L3, T12 previously treated with vertebroplasty. These fractures are new since the prior CT. Numerous small lucencies are seen throughout the visualized skeleton. Findings consistent with multiple myeloma. PET-CT performed on 12/25/2022 has an indication of initial treatment strategy for multiple myeloma. IMPRESSION: 1. There are vertebral fractures as detailed all new since the CT from 08/03/2021. Only the T12 fracture was evident on an MRI from 11/28/2022. The L1 and L3 fractures may be recent. There are underlying numerous small lucencies throughout the visualized skeleton consistent with multiple myeloma. 2. Significant bladder distension, which is the presumed cause of mild bilateral hydroureteronephrosis. 3. No other acute abnormality within the abdomen or pelvis. 4. Increased colonic stool burden and rectum moderately distended with stool. 5. Aortic atherosclerosis. Electronically Signed   By: Amie Portland M.D.    On: 01/26/2023 16:41    PERFORMANCE STATUS (ECOG) : 1 - Symptomatic but completely ambulatory  Review of Systems Unless otherwise noted, a complete review of systems is negative.  Physical Exam General: NAD Pulmonary: Unlabored Extremities: no edema, no joint deformities Skin: no rashes Neurological: Weakness but otherwise nonfocal  IMPRESSION: Patient well-known to me from clinic.  He was admitted yesterday with progressive weakness, and N/V/D.  Patient with recent diagnosis of multiple myeloma and was started on Dara-RD on 12/26/2022.  Revlimid has been held due to neutropenia.  Patient has somewhat poorly tolerated treatment so far.  He is mostly been concerned about intractable hiccups likely associated with Dara.  He has been tried on gabapentin and IV dexamethasone without significant improvement.  Baclofen has also been offered but patient reluctant.  He is mostly been symptomatic with back pain with known T12 pathologic fracture.  Patient  is status post kyphoplasty and palliative RT.  Patient has been reluctant to optimize pain regimen and is only taking oxycodone minimally and did not start previous MS Contin as prescribed.  Patient has been started on MS Contin during this hospitalization and he currently reports pain as being improved.  He says that it averages 5 out of 10, which he finds tolerable.  Note that he has not required any oxycodone for breakthrough pain since being hospitalized.  Overall, patient says that his symptoms are improving.  Discussed follow-up at the [facility]   I tried calling patient's wife but did not reach her.  PLAN: -Continue current scope of treatment -Agree with continued MS Contin/oxycodone -Will plan outpatient follow-up later this week  Case and plan discussed with Dr. Alena Bills  Time Total: 45 minutes  Visit consisted of counseling and education dealing with the complex and emotionally intense issues of symptom management and  palliative care in the setting of serious and potentially life-threatening illness.Greater than 50%  of this time was spent counseling and coordinating care related to the above assessment and plan.  Signed by: Laurette Schimke, PhD, NP-C

## 2023-01-27 NOTE — Plan of Care (Signed)
  Problem: Education: Goal: Knowledge of General Education information will improve Description: Including pain rating scale, medication(s)/side effects and non-pharmacologic comfort measures Outcome: Progressing   Problem: Health Behavior/Discharge Planning: Goal: Ability to manage health-related needs will improve Outcome: Progressing   Problem: Clinical Measurements: Goal: Ability to maintain clinical measurements within normal limits will improve Outcome: Progressing Goal: Diagnostic test results will improve Outcome: Progressing Goal: Respiratory complications will improve Outcome: Progressing Goal: Cardiovascular complication will be avoided Outcome: Progressing   Problem: Activity: Goal: Risk for activity intolerance will decrease Outcome: Progressing   Problem: Nutrition: Goal: Adequate nutrition will be maintained Outcome: Progressing   Problem: Coping: Goal: Level of anxiety will decrease Outcome: Progressing   Problem: Elimination: Goal: Will not experience complications related to bowel motility Outcome: Progressing

## 2023-01-27 NOTE — Consult Note (Signed)
Consulting Department:  Emergency department  Primary Physician:  [name], MD  Chief Complaint: Compression fracture  History of Present Illness: 01/27/2023 Melvin Gilmore is a 82 y.o. male who presents with the chief complaint multiple myeloma.  He was having some diffuse weakness so imaging was performed which demonstrated a multiple spinous lesions, followed up with an MRI to evaluate for any compression.  At this point he states that he is having no new numbness or tingling.  He states that his overall sense of general weakness has improved significantly.  He is having some issues with urinary retention.  Not having any radiating pain down to his legs.  Review of Systems:  A 10 point review of systems is negative, except for the pertinent positives and negatives detailed in the HPI.  Past Medical History: 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   Nausea vomiting and diarrhea 01/26/2023   Prostate cancer (HCC) 2023   Seasonal allergies    Skin cancer    a.) ears   Wears partial dentures    lower    Past Surgical History: Past Surgical History:  Procedure Laterality Date   APPENDECTOMY     CATARACT EXTRACTION W/PHACO Right 01/15/2016   Procedure: CATARACT EXTRACTION PHACO AND INTRAOCULAR LENS PLACEMENT (IOC);  Surgeon: Sherald Hess, MD;  Location: [facility];  Service: Ophthalmology;  Laterality: Right;  RIGHT CALL CELL PHONE WITH TIME   CHOLECYSTECTOMY     COLONOSCOPY     COLONOSCOPY WITH  ESOPHAGOGASTRODUODENOSCOPY (EGD)     ESOPHAGOGASTRODUODENOSCOPY  07/2021   ESOPHAGOGASTRODUODENOSCOPY (EGD) WITH PROPOFOL N/A 09/23/2017   Procedure: ESOPHAGOGASTRODUODENOSCOPY (EGD) WITH PROPOFOL;  Surgeon: Toledo, Boykin Nearing, MD;  Location: [facility];  Service: Gastroenterology;  Laterality: N/A;   HIGH INTENSITY FOCUSED ULTRASOUND (HIFU) OF THE PROSTATE N/A 02/07/2022   Procedure: HIGH INTENSITY FOCUSED ULTRASOUND (HIFU) OF THE PROSTATE;  Surgeon: Orson Ape, MD;  Location: [facility];  Service: Urology;  Laterality: N/A;   HYDROCELE EXCISION / REPAIR     IR KYPHO THORACIC WITH BONE BIOPSY  12/06/2022   IR RADIOLOGIST EVAL & MGMT  11/21/2022   LASIX RT EYE     PROSTATE BIOPSY N/A 01/03/2022   Procedure: PROSTATE BIOPSY  Addison Bacigalupo;  Surgeon: Orson Ape, MD;  Location: [facility];  Service: Urology;  Laterality: N/A;   ROTATOR CUFF REPAIR Left 2001    Allergies: Allergies as of 01/26/2023 - Review Complete 01/26/2023  Allergen Reaction Noted   Amoxicillin-pot clavulanate Rash and Other (See Comments) 11/30/2013   Meloxicam Rash and Other (See Comments) 11/30/2013    Medications:  Current Facility-Administered Medications:    acyclovir (ZOVIRAX) 200 MG capsule 400 mg, 400 mg, Oral, BID, Verdene Lennert, MD, 400 mg at 01/27/23 0931   ALPRAZolam (XANAX) tablet 0.25 mg, 0.25 mg, Oral, QHS PRN, Verdene Lennert, MD   aspirin EC tablet 81 mg, 81 mg, Oral, Daily, Verdene Lennert, MD, 81 mg at 01/27/23  0932   Chlorhexidine Gluconate Cloth 2 % PADS 6 each, 6 each, Topical, Daily, Sherryll Burger, Vipul, MD   dronabinol (MARINOL) capsule 2.5 mg, 2.5 mg, Oral, Daily, Verdene Lennert, MD, 2.5 mg at 01/27/23 0932   enoxaparin (LOVENOX) injection 40 mg, 40 mg, Subcutaneous, Q24H, Verdene Lennert, MD, 40 mg at 01/26/23 2109   feeding supplement (ENSURE ENLIVE / ENSURE PLUS) liquid 237 mL, 237 mL, Oral, BID BM, Verdene Lennert, MD   gabapentin (NEURONTIN) capsule 100 mg, 100 mg, Oral, Daily PRN, Verdene Lennert, MD   levothyroxine (SYNTHROID) tablet 50 mcg, 50 mcg, Oral, QAC breakfast, Verdene Lennert, MD, 50 mcg at 01/27/23 1610   morphine (MS CONTIN) 12 hr tablet 15 mg, 15 mg, Oral, Q12H, Verdene Lennert, MD, 15 mg at 01/27/23 0932   multivitamin with minerals tablet 1 tablet, 1 tablet, Oral, Daily, Verdene Lennert, MD, 1 tablet at 01/27/23 0931   ondansetron (ZOFRAN) tablet 4 mg, 4 mg, Oral, Q6H PRN **OR** ondansetron (ZOFRAN) injection 4 mg, 4 mg, Intravenous, Q6H PRN, Verdene Lennert, MD   oxyCODONE (Oxy IR/ROXICODONE) immediate release tablet 5 mg, 5 mg, Oral, Q6H PRN, Verdene Lennert, MD   pantoprazole (PROTONIX) EC tablet 40 mg, 40 mg, Oral, Daily, Verdene Lennert, MD, 40 mg at 01/27/23 0932   sodium chloride flush (NS) 0.9 % injection 3 mL, 3 mL, Intravenous, Q12H, Verdene Lennert, MD, 3 mL at 01/27/23 9604   Social History: Social History   Tobacco Use   Smoking status: Never   Smokeless tobacco: Never  Vaping Use   Vaping Use: Never used  Substance Use Topics   Alcohol use: Yes    Alcohol/week: 7.0 standard drinks of alcohol    Types: 7 Glasses of wine per week    Comment: wine occ   Drug use: No    Family Medical History: Family History  Problem Relation Age of Onset   Pneumonia Father     Physical Examination: Vitals:   01/26/23 2359 01/27/23 0755  BP: (!) 117/59 136/60  Pulse: 85 77  Resp: 16 17  Temp: 99 F (37.2 C) 99.5 F (37.5 C)  SpO2: 99% 98%     General: Patient is well developed, well nourished, calm, collected, and in no apparent distress.  NEUROLOGICAL:  General: In no acute distress.   Awake, alert, oriented to person, place, and time.  Pupils equal round and reactive to light.  Facial tone is symmetric.  Tongue protrusion is midline.  There is no pronator drift.  Palpation of spine: Midline tenderness n noted in the lumbar spine  Strength:  Full strength in bilateral lower extremities proximally and distally Lower extremity sensation is  intact to light touch.  Reflexes are 1 and symmetric at the biceps, triceps, brachioradialis, patella and achilles. Hoffman's is absent.  Clonus is not present.  Imaging: Narrative & Impression  CLINICAL DATA:  Metastatic disease evaluation   EXAM: MRI CERVICAL, THORACIC AND LUMBAR SPINE WITHOUT AND WITH CONTRAST   TECHNIQUE: Multiplanar and multiecho pulse sequences of the cervical spine, to include the craniocervical junction and cervicothoracic junction, and thoracic and lumbar spine, were obtained without and with intravenous contrast.   CONTRAST:  6mL GADAVIST GADOBUTROL 1 MMOL/ML IV SOLN   COMPARISON:  None Available.   FINDINGS: MRI CERVICAL SPINE FINDINGS   Alignment: Grade 1 C4-5 anterolisthesis and grade 1 C5-6 retrolisthesis   Vertebrae: No fracture, evidence of discitis, or bone lesion.   Cord: Normal signal and morphology.   Posterior Fossa, vertebral arteries, paraspinal  tissues: Negative.   Disc levels:   C1-2: Unremarkable.   C2-3: Small disc bulge with right-greater-than-left uncovertebral hypertrophy. There is no spinal canal stenosis. Moderate right neural foraminal stenosis.   C3-4: Small disc bulge with uncovertebral spurring and right-greater-than-left facet hypertrophy. There is no spinal canal stenosis. Severe right and mild left neural foraminal stenosis.   C4-5: Severe right facet arthrosis. There is no spinal canal stenosis. Mild right neural foraminal stenosis.   C5-6: Disc bulge with bilateral uncovertebral hypertrophy. Mild spinal canal stenosis. Severe bilateral neural foraminal stenosis.   C6-7: Left asymmetric disc bulge with endplate spurring. Mild spinal canal stenosis. Severe left neural foraminal stenosis.   C7-T1: Normal disc space and facet joints. There is no spinal canal stenosis. No neural foraminal stenosis.   MRI THORACIC SPINE FINDINGS   Alignment:  Physiologic.   Vertebrae: There is superior endplate signal  abnormalities at T8 and T9, likely minimally depressed compression fractures. There is abnormal signal within the inferior half of T11. Chronic compression deformity of T12 with augmentation cement. There is contrast enhancement at T9, T8 and T11 but no visualized soft tissue component.   Cord:  Normal signal and morphology.   Paraspinal and other soft tissues: Negative.   Disc levels:   No spinal canal or neural foraminal stenosis.   MRI LUMBAR SPINE FINDINGS   Segmentation:  Standard.   Alignment:  Physiologic.   Vertebrae: Compression deformities at L1 and L3 with mild bone marrow edema. No soft tissue mass.   Conus medullaris and cauda equina: Conus extends to the L1 level. Conus and cauda equina appear normal.   Paraspinal and other soft tissues: Negative   Disc levels:   L1-L2: Normal disc space and facet joints. No spinal canal stenosis. No neural foraminal stenosis.   L2-L3: Small disc bulge with mild facet hypertrophy. No spinal canal stenosis. No neural foraminal stenosis.   L3-L4: Right asymmetric disc bulge. Mild spinal canal stenosis. Severe narrowing of the right lateral recess and mild left lateral recess narrowing. Mild right neural foraminal stenosis.   L4-L5: Small disc bulge with endplate spurring. Right lateral recess narrowing without central spinal canal stenosis. Mild right and moderate left neural foraminal stenosis.   L5-S1: Normal disc space and facet joints. No spinal canal stenosis. No neural foraminal stenosis.   Visualized sacrum: Normal.   IMPRESSION: 1. Acute to subacute compression fractures of T8, T9, L1 and L3 with mild bone marrow edema and enhancement. There are no specific imaging features to indicate a neoplastic process; but, given history of myeloma, myelomatous involvement is probable. 2. 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 3. Mild C5-6 and C6-7 spinal canal stenosis. 4. Severe right  lateral recess and mild left lateral recess narrowing at L3-4. 5. Moderate left L4-5 neural foraminal stenosis.     Electronically Signed   By: Deatra Robinson M.D.   On: 01/26/2023 20:05     Narrative & Impression  CLINICAL DATA:  Abdominal pain.  Weakness.   EXAM: CT ABDOMEN AND PELVIS WITH CONTRAST   TECHNIQUE: Multidetector CT imaging of the abdomen and pelvis was performed using the standard protocol following bolus administration of intravenous contrast.   RADIATION DOSE REDUCTION: This exam was performed according to the departmental dose-optimization program which includes automated exposure control, adjustment of the mA and/or kV according to patient size and/or use of iterative reconstruction technique.   CONTRAST:  80mL OMNIPAQUE IOHEXOL 300 MG/ML  SOLN   COMPARISON:  08/03/2021.   FINDINGS: Lower  chest: No acute findings. 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   Hepatobiliary: Liver normal in size. 2 cysts in segment 2, smaller than on the prior CT. Largest measures 2 cm. No other liver masses or lesions. Status post cholecystectomy. No bile duct dilation.   Pancreas: Unremarkable. No pancreatic ductal dilatation or surrounding inflammatory changes.   Spleen: Normal in size without focal abnormality.   Adrenals/Urinary Tract: Normal adrenal glands. Kidneys normal in size, orientation and position with symmetric enhancement and excretion. 1.8 cm posterior midpole right renal cyst, stable with no follow-up indicated. No other renal masses. Mild bilateral hydronephrosis and hydroureter. No renal or ureteral stones. Bladder is distended. No bladder wall thickening, mass or stone.   Stomach/Bowel: Small hiatal hernia.  Stomach otherwise unremarkable.   Small bowel and colon are normal in caliber. No wall thickening or inflammation. Mild to moderate increase in the colonic stool burden. Rectum moderately distended  with stool.   Vascular/Lymphatic: Aortic atherosclerosis. No aneurysm. No enlarged lymph nodes.   Reproductive: Unremarkable.   Other: No ascites.  No hernia.   Musculoskeletal: There are compression fractures of T12, L1 and L3, T12 previously treated with vertebroplasty. These fractures are new since the prior CT. Numerous small lucencies are seen throughout the visualized skeleton. Findings consistent with multiple myeloma. PET-CT performed on 12/25/2022 has an indication of initial treatment strategy for multiple myeloma.   IMPRESSION: 1. There are vertebral fractures as detailed all new since the CT from 08/03/2021. Only the T12 fracture was evident on an MRI from 11/28/2022. The L1 and L3 fractures may be recent. There are underlying numerous small lucencies throughout the visualized skeleton consistent with multiple myeloma. 2. Significant bladder distension, which is the presumed cause of mild bilateral hydroureteronephrosis. 3. No other acute abnormality within the abdomen or pelvis. 4. Increased colonic stool burden and rectum moderately distended with stool. 5. Aortic atherosclerosis.     Electronically Signed   By: Amie Portland M.D.   On: 01/26/2023 16:41        I have personally reviewed the images and agree with the above interpretation.  Labs:    Latest Ref Rng & Units 01/27/2023    6:25 AM 01/26/2023   12:21 PM 01/24/2023    9:01 AM  CBC  WBC 4.0 - 10.5 K/uL 3.7  5.5  2.1   Hemoglobin 13.0 - 17.0 g/dL 09.8  11.9  14.7   Hematocrit 39.0 - 52.0 % 29.2  30.8  31.6   Platelets 150 - 400 K/uL 170  185  173        Assessment and Plan:  Mr. Melvin Gilmore is a pleasant 82 y.o. male with a history of multiple myeloma.  Neurosurgery was consulted for possibility of spinal compression given urinary retention in the setting of spinal lesions.  On physical exam he is full strength without any sensory changes.  His reflexes are intact and balanced bilaterally.  Not  having any new numbness or tingling.  His imaging did demonstrate multiple osseous lesions, however MRI was not conclusive for multiple myeloma, however given his risk factors this would likely represent lesions.  They are nonsurgical, will do further treatment.  Urinary retention does not seem to be related to any spinal compression as there is no significant compression noted on his imaging.  Likely secondary to his constipation and stool impaction.  Continue to consider other etiologies.  Urinary retention Unlikely to be secondary to spine lesions.  No evidence  of compression on imaging.  Consider alternatives such as constipation relation.  Compression fracture of lumbar vertebra, unspecified lumbar vertebral level, initial encounter [facility]) These lesions are nonsurgical in nature, could consider bracing for comfort, will defer further treatment to his oncology team.  Lovenia Kim, MD/MSCR Dept. of Neurosurgery

## 2023-01-27 NOTE — Evaluation (Signed)
Physical Therapy Evaluation Patient Details Name: Melvin Gilmore MRN: [id number] DOB: 1941/03/20 Today's Date: 01/27/2023  History of Present Illness  presented to ER secondary to progressive weakness, nausea/vomiting; admitted for management of acute urinary retention, pathological vertebral fracture  Clinical Impression  Patient resting in bed upon arrival to room; sleeping, but easily awakens to voice/light touch.  Oriented to basic information, follows commands, agreeable to participation with session.  Endorses generalized back pain, 5/10; minimally changed with activity and repositioning.  Bilat UE/LE strength and ROM grossly symmetrical and WFL; no focal weakness, paresthesia or radicular symptoms reported.  Able to complete bed mobility with mod indep; sit/stand, basic transfers and gait (210') with RW, cga/closes up.  Demonstrates reciprocal stepping pattern with good step height/length, fair cadence (10' walk time, 10 seconds); no overt buckling or LOB. Do recommend continued use of RW for optimal stability and overall energy conservation.  Patient voiced understanding/agreement; prefers 4WRW used at home prior to admission. Attempted assessment of stair negotiation; patient declined this date due to fatigue and persistent back pain.  Will plan to assess in subsequent sessions, as patient does have a full flight to access bed/bathroom in home environment. Would benefit from skilled PT to address above deficits and promote optimal return to PLOF.; recommend post-acute PT follow up as indicated by interdisciplinary care team.   .       Assistance Recommended at Discharge PRN  If plan is discharge home, recommend the following:  Can travel by private vehicle  A little help with walking and/or transfers;A little help with bathing/dressing/bathroom;Help with stairs or ramp for entrance        Equipment Recommendations  (has (and prefers) 4UJW)  Recommendations for Other Services        Functional Status Assessment Patient has had a recent decline in their functional status and demonstrates the ability to make significant improvements in function in a reasonable and predictable amount of time.     Precautions / Restrictions Precautions Precautions: Fall Restrictions Weight Bearing Restrictions: No      Mobility  Bed Mobility Overal bed mobility: Modified Independent                  Transfers Overall transfer level: Needs assistance Equipment used: Rolling walker (2 wheels) Transfers: Sit to/from Stand Sit to Stand: Min guard, Min assist           General transfer comment: prefers to place bilat hands on thighs when transitioning (baseline/preferred movement pattern)    Ambulation/Gait Ambulation/Gait assistance: Min guard Gait Distance (Feet): 210 Feet Assistive device: Rolling walker (2 wheels)   Gait velocity: 10' walk time, 10 seconds Gait velocity interpretation: <1.31 ft/sec, indicative of household ambulator   General Gait Details: reciprocal stepping pattern with good step height/length, fair cadence; no overt buckling or LOB.  Do recommend continued use of  RW for optimal stability and overall energy conservation  Stairs Stairs:  (patient declined stairs due to fatigue, back pain this date)          Wheelchair Mobility     Tilt Bed    Modified Rankin (Stroke Patients Only)       Balance Overall balance assessment: Needs assistance Sitting-balance support: No upper extremity supported, Feet supported Sitting balance-Leahy Scale: Good     Standing balance support: Bilateral upper extremity supported Standing balance-Leahy Scale: Fair  Pertinent Vitals/Pain Pain Assessment Pain Assessment: 0-10 Pain Score: 5  Pain Location: back Pain Descriptors / Indicators: Aching, Guarding Pain Intervention(s): Limited activity within patient's tolerance, Monitored during session,  Repositioned    Home Living Family/patient expects to be discharged to:: Private residence Living Arrangements: Spouse/significant other;Children Available Help at Discharge: Family;Available 24 hours/day Type of Home: House       Alternate Level Stairs-Number of Steps: flight (3-landing-8 to 10 more) Home Layout: Two level Home Equipment: Shower seat - built in;Grab bars - tub/shower;Rollator (4 wheels);Grab bars - toilet      Prior Function Prior Level of Function : Needs assist       Physical Assist : ADLs (physical)   ADLs (physical): Dressing;Bathing Mobility Comments: Using [phone] for functional mobility recently; denies fall history ADLs Comments: Wife assists with bathing and dressing. Reports decreased independence with self care over last few weeks.     Hand Dominance        Extremity/Trunk Assessment   Upper Extremity Assessment Upper Extremity Assessment: Overall WFL for tasks assessed    Lower Extremity Assessment Lower Extremity Assessment: Overall WFL for tasks assessed (grossly at least 4/5; denies numbness/paresthesia or radicular symptoms)       Communication   Communication: HOH (hears best from L ear)  Cognition Arousal/Alertness: Awake/alert Behavior During Therapy: WFL for tasks assessed/performed Overall Cognitive Status: Within Functional Limits for tasks assessed                                          General Comments      Exercises     Assessment/Plan    PT Assessment Patient needs continued PT services  PT Problem List Decreased activity tolerance;Decreased balance;Decreased mobility;Decreased knowledge of use of DME;Decreased safety awareness;Decreased knowledge of precautions;Pain       PT Treatment Interventions Gait training;Stair training;Functional mobility training;Therapeutic activities;DME instruction;Therapeutic exercise;Balance training;Patient/family education    PT Goals (Current goals can be found  in the Care Plan section)  Acute Rehab PT Goals Patient Stated Goal: to return home PT Goal Formulation: With patient Time For Goal Achievement: 02/10/23 Potential to Achieve Goals: Good    Frequency Min 1X/week     Co-evaluation PT/OT/SLP Co-Evaluation/Treatment: Yes Reason for Co-Treatment: For patient/therapist safety PT goals addressed during session: Mobility/safety with mobility OT goals addressed during session: ADL's and self-care       AM-PAC PT "6 Clicks" Mobility  Outcome Measure Help needed turning from your back to your side while in a flat bed without using bedrails?: None Help needed moving from lying on your back to sitting on the side of a flat bed without using bedrails?: None Help needed moving to and from a bed to a chair (including a wheelchair)?: A Little Help needed standing up from a chair using your arms (e.g., wheelchair or bedside chair)?: A Little Help needed to walk in hospital room?: A Little Help needed climbing 3-5 steps with a railing? : A Little 6 Click Score: 20    End of Session   Activity Tolerance: Patient tolerated treatment well Patient left: in chair;with call bell/phone within reach;with chair alarm set Nurse Communication: Mobility status PT Visit Diagnosis: Muscle weakness (generalized) (M62.81);Difficulty in walking, not elsewhere classified (R26.2);Pain    Time: 1415-1435 PT Time Calculation (min) (ACUTE ONLY): 20 min   Charges:   PT Evaluation $PT Eval Low Complexity: 1 Low  PT General Charges $$ ACUTE PT VISIT: 1 Visit        Gerardo Caiazzo H. Manson Passey, PT, DPT, NCS 01/27/23, 3:38 PM [phone]

## 2023-01-27 NOTE — Progress Notes (Signed)
PROGRESS NOTE    Melvin Gilmore  ZOX:[id number] DOB: 1941-03-07 DOA: 01/26/2023 PCP: [name], MD    Brief Narrative:   82 y.o. male with medical history significant of multiple myeloma stage II currently on on daratumumab and Revlimid and s/p recent radiation therapy, multiple pathologic spinal fractures s/p kyphoplasty, hypertension, hyperlipidemia, Mnire disease, normocytic anemia, anxiety disorder on Xanax, who presents to the ED due to worsening weakness, nausea, vomiting and diarrhea.  He was found to have acute urinary retention requiring Foley.  7/8: Neurosurgery consult for pathological vertebral fracture-conservative management.  Brace recommended.  Palliative care consult   Assessment & Plan:   Principal Problem:   Acute urinary retention Active Problems:   Vertebral fracture, pathological   Overflow diarrhea   Hyponatremia   Therapeutic opioid induced constipation   Generalized weakness   Multiple myeloma (HCC)   Hypothyroidism   Hypertension  * Acute urinary retention Patient presented with nausea and inability to urinate with marked urinary retention seen on CT imaging. Over 1.3L out once foley was placed.     - Evaluated by neurosurgery who do not think this is from spinal lesion or cord compression - Foley catheter placed; will likely need on discharge due to bladder stretch injury -Outpatient urology evaluation.   Vertebral fracture, pathological Patient presenting with increased lower back pain compared with chronic back pain with evidence of new L1 and L3 fractures and multiple previous compression fractures secondary to multiple myeloma.   Patient's pain has not been well-controlled with home oxycodone.  He has been prescribed morphine ER by oncology, however has been too nervous to start.   - s/p kyphoplasty by Dr. Elijio Miles on 12/06/22.  No improvement in pain - Completed palliative RT on 01/20/2023.  Denies any improvement yet. - Neurosurgery  recommends conservative management with brace - Continue home oxycodone every 8 hours as needed - Continue previously prescribed morphine ER 15 mg twice daily  # Multiple myeloma, Stage II -MRI thoracic and lumbar spine with and without contrast shows subacute compression fracture at T12.  Focal enhancing marrow replacing lesions in T6, T8, T5, T2 all suspicious for metastatic disease.  L1 lesion measuring 16 mm.  Multilevel degenerative changes present. -He is followed by oncology Dr. Laurelyn Sickle and palliative care - s/p recent radiation therapy that was palliative nature.  Revlimid hide has been on hold but he is still on daratumumab. -Continue outpatient follow-up with oncology   # Intractable hiccups -Hiccups start after cancer treatment and last for about 12 to 24 hours.  Started with second cycle also but now resolved   # Opioid-induced constipation -On bowel regimen Patient states that he continues to have difficulty with constipation despite taking senna, MiraLAX and lactulose.  He has tried Movantik in the past without improvement.  Given this is opioid-induced, Methylnaltrexone may be a good option to try while admitted. It may be beneficial to consider Amitiza as well in the future.    - Trial of methylnaltrexone with soap sud enema   # History of prostate cancer - follows with Dr. Evelene Croon, urology for elevated PSA.  MRI prostate showed 1 mm category 5 lesion of anterior transition zone.  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 s/p HIFU of the prostate on 02/07/2022.  PSA level from 10/08/2022 was 3.34.  -PSA from 11/21/2022 3.44. -I have reached out to [facility] from urology to see if they want to see him while he is here.   #  Normocytic anemia -Chronic, worsening on Revlimid -Iron panel, B12 and folate normal.   # Poor appetite # Weight loss -Could be from cancer and uncontrolled pain.  Pain management as above.  -He is  taking Marinol 2.5 mg once daily.  Could not tolerate twice daily dose because it was making him feel hyper at night and inability to sleep.  Not noticing any improvement yet   # Anxiety -On longstanding Xanax 0.25 mg at night as needed. -Anxiety has been heightened with the recent diagnosis, pain, poor appetite.    Overflow diarrhea Likely multifactorial in the setting of opioid-induced constipation and bladder distention leading to possible obstruction.  Bladder has been decompressed at this time.  Given patient is currently on immunomodulators, will rule out infectious etiology, however unlikely.    - One-time soapsuds enema to try and break up stool ball.  Started him on stool softener - GI panel negative   Hyponatremia Likely due to poor p.o. intake and severe diarrhea over the last 24 hours.  History of chronic hyponatremia with baseline sodium around 129.  123 on admission-> 129 today - Continue IV hydration   Generalized weakness I suspect this is multifactorial but acutely worsened by GI fluid losses.  Continue IV fluid resuscitation.  No focal weakness noted on examination.   Hypothyroidism - Continue home Synthroid   Hypertension Not on medications   DVT prophylaxis: Lovenox enoxaparin (LOVENOX) injection 40 mg Start: 01/26/23 2100     Code Status: DNR Family Communication: Updated patient's spouse/Robyn over phone Disposition Plan: Possible discharge home in next 1 to 2 days depending on clinical condition.   Consultants:  Neurosurgery    Subjective:  Patient is feeling better, still having significant back pain which has been going on for a while  Objective: Vitals:   01/26/23 1530 01/26/23 2140 01/26/23 2359 01/27/23 0755  BP: 122/64 (!) 154/67 (!) 117/59 136/60  Pulse: 76 82 85 77  Resp: 16 18 16 17   Temp:  98.9 F (37.2 C) 99 F (37.2 C) 99.5 F (37.5 C)  TempSrc:      SpO2: 100% 99% 99% 98%  Weight:      Height:        Intake/Output Summary  (Last 24 hours) at 01/27/2023 1200 Last data filed at 01/27/2023 1032 Gross per 24 hour  Intake 752.02 ml  Output 2150 ml  Net -1397.98 ml   Filed Weights   01/26/23 1219  Weight: 61.2 kg    Examination:  General exam: Appears calm and comfortable  Respiratory system: Clear to auscultation. Respiratory effort normal. Cardiovascular system: S1 & S2 heard, RRR. No JVD, murmurs, rubs, gallops or clicks. No pedal edema. Gastrointestinal system: Abdomen is nondistended, soft and nontender. No organomegaly or masses felt. Normal bowel sounds heard. Central nervous system: Alert and oriented. No focal neurological deficits. Extremities: Symmetric 5 x 5 power. Skin: No rashes, lesions or ulcers Psychiatry: Judgement and insight appear normal. Mood & affect appropriate.     Data Reviewed: I have personally reviewed following labs and imaging studies  CBC: Recent Labs  Lab 01/20/23 1403 01/24/23 0901 01/26/23 1221 01/27/23 0625  WBC 1.6* 2.1* 5.5 3.7*  NEUTROABS 0.7* 1.1*  --   --   HGB 10.7* 11.2* 11.0* 10.3*  HCT 29.5* 31.6* 30.8* 29.2*  MCV 99.0 98.8 99.0 100.3*  PLT 149* 173 185 170   Basic Metabolic Panel: Recent Labs  Lab 01/26/23 1221 01/26/23 2147 01/27/23 0625  NA 123* 127* 129*  K  3.9 4.2 3.8  CL 93* 97* 99  CO2 20* 23 24  GLUCOSE 111* 108* 96  BUN 17 14 12   CREATININE 0.87 0.94 0.90  CALCIUM 8.1* 8.6* 8.2*  MG  --  2.3 2.6*   GFR: Estimated Creatinine Clearance: 53.8 mL/min (by C-G formula based on SCr of 0.9 mg/dL). Liver Function Tests: Recent Labs  Lab 01/26/23 1221  AST 23  ALT 28  ALKPHOS 127*  BILITOT 1.4*  PROT 6.2*  ALBUMIN 3.7   Recent Labs  Lab 01/26/23 1221  LIPASE 32   No results for input(s): "AMMONIA" in the last 168 hours. Coagulation Profile: No results for input(s): "INR", "PROTIME" in the last 168 hours. Cardiac Enzymes: No results for input(s): "CKTOTAL", "CKMB", "CKMBINDEX", "TROPONINI" in the last 168 hours. BNP (last 3  results) No results for input(s): "PROBNP" in the last 8760 hours. HbA1C: No results for input(s): "HGBA1C" in the last 72 hours. CBG: No results for input(s): "GLUCAP" in the last 168 hours. Lipid Profile: No results for input(s): "CHOL", "HDL", "LDLCALC", "TRIG", "CHOLHDL", "LDLDIRECT" in the last 72 hours. Thyroid Function Tests: No results for input(s): "TSH", "T4TOTAL", "FREET4", "T3FREE", "THYROIDAB" in the last 72 hours. Anemia Panel: No results for input(s): "VITAMINB12", "FOLATE", "FERRITIN", "TIBC", "IRON", "RETICCTPCT" in the last 72 hours. Sepsis Labs: No results for input(s): "PROCALCITON", "LATICACIDVEN" in the last 168 hours.  Recent Results (from the past 240 hour(s))  Gastrointestinal Panel by PCR , Stool     Status: None   Collection Time: 01/27/23 12:12 AM   Specimen: Stool  Result Value Ref Range Status   Campylobacter species NOT DETECTED NOT DETECTED Final   Plesimonas shigelloides NOT DETECTED NOT DETECTED Final   Salmonella species NOT DETECTED NOT DETECTED Final   Yersinia enterocolitica NOT DETECTED NOT DETECTED Final   Vibrio species NOT DETECTED NOT DETECTED Final   Vibrio cholerae NOT DETECTED NOT DETECTED Final   Enteroaggregative E coli (EAEC) NOT DETECTED NOT DETECTED Final   Enteropathogenic E coli (EPEC) NOT DETECTED NOT DETECTED Final   Enterotoxigenic E coli (ETEC) NOT DETECTED NOT DETECTED Final   Shiga like toxin producing E coli (STEC) NOT DETECTED NOT DETECTED Final   Shigella/Enteroinvasive E coli (EIEC) NOT DETECTED NOT DETECTED Final   Cryptosporidium NOT DETECTED NOT DETECTED Final   Cyclospora cayetanensis NOT DETECTED NOT DETECTED Final   Entamoeba histolytica NOT DETECTED NOT DETECTED Final   Giardia lamblia NOT DETECTED NOT DETECTED Final   Adenovirus F40/41 NOT DETECTED NOT DETECTED Final   Astrovirus NOT DETECTED NOT DETECTED Final   Norovirus GI/GII NOT DETECTED NOT DETECTED Final   Rotavirus A NOT DETECTED NOT DETECTED Final    Sapovirus (I, II, IV, and V) NOT DETECTED NOT DETECTED Final    Comment: Performed at [facility], [address] [place], [place] [address]  C Difficile Quick Screen w PCR reflex     Status: None   Collection Time: 01/27/23 12:12 AM   Specimen: STOOL  Result Value Ref Range Status   C Diff antigen NEGATIVE NEGATIVE Final   C Diff toxin NEGATIVE NEGATIVE Final   C Diff interpretation No C. difficile detected.  Final    Comment: Performed at [facility], [address] [place], [place] [address]         Radiology Studies: MR CERVICAL SPINE W WO CONTRAST  Result Date: 01/26/2023 CLINICAL DATA:  Metastatic disease evaluation EXAM: MRI CERVICAL, THORACIC AND LUMBAR SPINE WITHOUT AND WITH CONTRAST TECHNIQUE: Multiplanar and  multiecho pulse sequences of the cervical spine, to include the craniocervical junction and cervicothoracic junction, and thoracic and lumbar spine, were obtained without and with intravenous contrast. CONTRAST:  6mL GADAVIST GADOBUTROL 1 MMOL/ML IV SOLN COMPARISON:  None Available. FINDINGS: MRI CERVICAL SPINE FINDINGS Alignment: Grade 1 C4-5 anterolisthesis and grade 1 C5-6 retrolisthesis Vertebrae: No fracture, evidence of discitis, or bone lesion. Cord: Normal signal and morphology. Posterior Fossa, vertebral arteries, paraspinal tissues: Negative. Disc levels: C1-2: Unremarkable. C2-3: Small disc bulge with right-greater-than-left uncovertebral hypertrophy. There is no spinal canal stenosis. Moderate right neural foraminal stenosis. C3-4: Small disc bulge with uncovertebral spurring and right-greater-than-left facet hypertrophy. There is no spinal canal stenosis. Severe right and mild left neural foraminal stenosis. C4-5: Severe right facet arthrosis. There is no spinal canal stenosis. Mild right neural foraminal stenosis. C5-6: Disc bulge with bilateral uncovertebral hypertrophy. Mild spinal canal stenosis. Severe bilateral neural foraminal stenosis.  C6-7: Left asymmetric disc bulge with endplate spurring. Mild spinal canal stenosis. Severe left neural foraminal stenosis. C7-T1: Normal disc space and facet joints. There is no spinal canal stenosis. No neural foraminal stenosis. MRI THORACIC SPINE FINDINGS Alignment:  Physiologic. Vertebrae: There is superior endplate signal abnormalities at T8 and T9, likely minimally depressed compression fractures. There is abnormal signal within the inferior half of T11. Chronic compression deformity of T12 with augmentation cement. There is contrast enhancement at T9, T8 and T11 but no visualized soft tissue component. Cord:  Normal signal and morphology. Paraspinal and other soft tissues: Negative. Disc levels: No spinal canal or neural foraminal stenosis. MRI LUMBAR SPINE FINDINGS Segmentation:  Standard. Alignment:  Physiologic. Vertebrae: Compression deformities at L1 and L3 with mild bone marrow edema. No soft tissue mass. Conus medullaris and cauda equina: Conus extends to the L1 level. Conus and cauda equina appear normal. Paraspinal and other soft tissues: Negative Disc levels: L1-L2: Normal disc space and facet joints. No spinal canal stenosis. No neural foraminal stenosis. L2-L3: Small disc bulge with mild facet hypertrophy. No spinal canal stenosis. No neural foraminal stenosis. L3-L4: Right asymmetric disc bulge. Mild spinal canal stenosis. Severe narrowing of the right lateral recess and mild left lateral recess narrowing. Mild right neural foraminal stenosis. L4-L5: Small disc bulge with endplate spurring. Right lateral recess narrowing without central spinal canal stenosis. Mild right and moderate left neural foraminal stenosis. L5-S1: Normal disc space and facet joints. No spinal canal stenosis. No neural foraminal stenosis. Visualized sacrum: Normal. IMPRESSION: 1. Acute to subacute compression fractures of T8, T9, L1 and L3 with mild bone marrow edema and enhancement. There are no specific imaging features  to indicate a neoplastic process; but, given history of myeloma, myelomatous involvement is probable. 2. 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 3. Mild C5-6 and C6-7 spinal canal stenosis. 4. Severe right lateral recess and mild left lateral recess narrowing at L3-4. 5. Moderate left L4-5 neural foraminal stenosis. Electronically Signed   By: Deatra Robinson M.D.   On: 01/26/2023 20:05   MR Lumbar Spine W Wo Contrast  Result Date: 01/26/2023 CLINICAL DATA:  Metastatic disease evaluation EXAM: MRI CERVICAL, THORACIC AND LUMBAR SPINE WITHOUT AND WITH CONTRAST TECHNIQUE: Multiplanar and multiecho pulse sequences of the cervical spine, to include the craniocervical junction and cervicothoracic junction, and thoracic and lumbar spine, were obtained without and with intravenous contrast. CONTRAST:  6mL GADAVIST GADOBUTROL 1 MMOL/ML IV SOLN COMPARISON:  None Available. FINDINGS: MRI CERVICAL SPINE FINDINGS Alignment: Grade 1 C4-5 anterolisthesis and grade 1 C5-6 retrolisthesis Vertebrae: No fracture, evidence  of discitis, or bone lesion. Cord: Normal signal and morphology. Posterior Fossa, vertebral arteries, paraspinal tissues: Negative. Disc levels: C1-2: Unremarkable. C2-3: Small disc bulge with right-greater-than-left uncovertebral hypertrophy. There is no spinal canal stenosis. Moderate right neural foraminal stenosis. C3-4: Small disc bulge with uncovertebral spurring and right-greater-than-left facet hypertrophy. There is no spinal canal stenosis. Severe right and mild left neural foraminal stenosis. C4-5: Severe right facet arthrosis. There is no spinal canal stenosis. Mild right neural foraminal stenosis. C5-6: Disc bulge with bilateral uncovertebral hypertrophy. Mild spinal canal stenosis. Severe bilateral neural foraminal stenosis. C6-7: Left asymmetric disc bulge with endplate spurring. Mild spinal canal stenosis. Severe left neural foraminal stenosis. C7-T1: Normal disc space and  facet joints. There is no spinal canal stenosis. No neural foraminal stenosis. MRI THORACIC SPINE FINDINGS Alignment:  Physiologic. Vertebrae: There is superior endplate signal abnormalities at T8 and T9, likely minimally depressed compression fractures. There is abnormal signal within the inferior half of T11. Chronic compression deformity of T12 with augmentation cement. There is contrast enhancement at T9, T8 and T11 but no visualized soft tissue component. Cord:  Normal signal and morphology. Paraspinal and other soft tissues: Negative. Disc levels: No spinal canal or neural foraminal stenosis. MRI LUMBAR SPINE FINDINGS Segmentation:  Standard. Alignment:  Physiologic. Vertebrae: Compression deformities at L1 and L3 with mild bone marrow edema. No soft tissue mass. Conus medullaris and cauda equina: Conus extends to the L1 level. Conus and cauda equina appear normal. Paraspinal and other soft tissues: Negative Disc levels: L1-L2: Normal disc space and facet joints. No spinal canal stenosis. No neural foraminal stenosis. L2-L3: Small disc bulge with mild facet hypertrophy. No spinal canal stenosis. No neural foraminal stenosis. L3-L4: Right asymmetric disc bulge. Mild spinal canal stenosis. Severe narrowing of the right lateral recess and mild left lateral recess narrowing. Mild right neural foraminal stenosis. L4-L5: Small disc bulge with endplate spurring. Right lateral recess narrowing without central spinal canal stenosis. Mild right and moderate left neural foraminal stenosis. L5-S1: Normal disc space and facet joints. No spinal canal stenosis. No neural foraminal stenosis. Visualized sacrum: Normal. IMPRESSION: 1. Acute to subacute compression fractures of T8, T9, L1 and L3 with mild bone marrow edema and enhancement. There are no specific imaging features to indicate a neoplastic process; but, given history of myeloma, myelomatous involvement is probable. 2. Multilevel moderate-to-severe cervical neural  foraminal stenosis. 3. Mild C5-6 and C6-7 spinal canal stenosis. 4. Severe right lateral recess and mild left lateral recess narrowing at L3-4. 5. Moderate left L4-5 neural foraminal stenosis. Electronically Signed   By: Deatra Robinson M.D.   On: 01/26/2023 20:05   MR THORACIC SPINE W WO CONTRAST  Result Date: 01/26/2023 CLINICAL DATA:  Metastatic disease evaluation EXAM: MRI CERVICAL, THORACIC AND LUMBAR SPINE WITHOUT AND WITH CONTRAST TECHNIQUE: Multiplanar and multiecho pulse sequences of the cervical spine, to include the craniocervical junction and cervicothoracic junction, and thoracic and lumbar spine, were obtained without and with intravenous contrast. CONTRAST:  6mL GADAVIST GADOBUTROL 1 MMOL/ML IV SOLN COMPARISON:  None Available. FINDINGS: MRI CERVICAL SPINE FINDINGS Alignment: Grade 1 C4-5 anterolisthesis and grade 1 C5-6 retrolisthesis Vertebrae: No fracture, evidence of discitis, or bone lesion. Cord: Normal signal and morphology. Posterior Fossa, vertebral arteries, paraspinal tissues: Negative. Disc levels: C1-2: Unremarkable. C2-3: Small disc bulge with right-greater-than-left uncovertebral hypertrophy. There is no spinal canal stenosis. Moderate right neural foraminal stenosis. C3-4: Small disc bulge with uncovertebral spurring and right-greater-than-left facet hypertrophy. There is no spinal canal stenosis. Severe right  and mild left neural foraminal stenosis. C4-5: Severe right facet arthrosis. There is no spinal canal stenosis. Mild right neural foraminal stenosis. C5-6: Disc bulge with bilateral uncovertebral hypertrophy. Mild spinal canal stenosis. Severe bilateral neural foraminal stenosis. C6-7: Left asymmetric disc bulge with endplate spurring. Mild spinal canal stenosis. Severe left neural foraminal stenosis. C7-T1: Normal disc space and facet joints. There is no spinal canal stenosis. No neural foraminal stenosis. MRI THORACIC SPINE FINDINGS Alignment:  Physiologic. Vertebrae: There is  superior endplate signal abnormalities at T8 and T9, likely minimally depressed compression fractures. There is abnormal signal within the inferior half of T11. Chronic compression deformity of T12 with augmentation cement. There is contrast enhancement at T9, T8 and T11 but no visualized soft tissue component. Cord:  Normal signal and morphology. Paraspinal and other soft tissues: Negative. Disc levels: No spinal canal or neural foraminal stenosis. MRI LUMBAR SPINE FINDINGS Segmentation:  Standard. Alignment:  Physiologic. Vertebrae: Compression deformities at L1 and L3 with mild bone marrow edema. No soft tissue mass. Conus medullaris and cauda equina: Conus extends to the L1 level. Conus and cauda equina appear normal. Paraspinal and other soft tissues: Negative Disc levels: L1-L2: Normal disc space and facet joints. No spinal canal stenosis. No neural foraminal stenosis. L2-L3: Small disc bulge with mild facet hypertrophy. No spinal canal stenosis. No neural foraminal stenosis. L3-L4: Right asymmetric disc bulge. Mild spinal canal stenosis. Severe narrowing of the right lateral recess and mild left lateral recess narrowing. Mild right neural foraminal stenosis. L4-L5: Small disc bulge with endplate spurring. Right lateral recess narrowing without central spinal canal stenosis. Mild right and moderate left neural foraminal stenosis. L5-S1: Normal disc space and facet joints. No spinal canal stenosis. No neural foraminal stenosis. Visualized sacrum: Normal. IMPRESSION: 1. Acute to subacute compression fractures of T8, T9, L1 and L3 with mild bone marrow edema and enhancement. There are no specific imaging features to indicate a neoplastic process; but, given history of myeloma, myelomatous involvement is probable. 2. 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 3. Mild C5-6 and C6-7 spinal canal stenosis. 4. Severe right lateral recess and mild left lateral recess narrowing at L3-4. 5.  Moderate left L4-5 neural foraminal stenosis. Electronically Signed   By: Deatra Robinson M.D.   On: 01/26/2023 20:05   CT ABDOMEN PELVIS W CONTRAST  Result Date: 01/26/2023 CLINICAL DATA:  Abdominal pain.  Weakness. EXAM: CT ABDOMEN AND PELVIS WITH CONTRAST TECHNIQUE: Multidetector CT imaging of the abdomen and pelvis was performed using the standard protocol following bolus administration of intravenous contrast. RADIATION DOSE REDUCTION: This exam was performed according to the departmental dose-optimization program which includes automated exposure control, adjustment of the mA and/or kV according to patient size and/or use of iterative reconstruction technique. CONTRAST:  80mL OMNIPAQUE IOHEXOL 300 MG/ML  SOLN COMPARISON:  08/03/2021. FINDINGS: Lower chest: No acute findings. 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 Hepatobiliary: Liver normal in size. 2 cysts in segment 2, smaller than on the prior CT. Largest measures 2 cm. No other liver masses or lesions. Status post cholecystectomy. No bile duct dilation. Pancreas: Unremarkable. No pancreatic ductal dilatation or surrounding inflammatory changes. Spleen: Normal in size without focal abnormality. Adrenals/Urinary Tract: Normal adrenal glands. Kidneys normal in size, orientation and position with symmetric enhancement and excretion. 1.8 cm posterior midpole right renal cyst, stable with no follow-up indicated. No other renal masses. Mild bilateral hydronephrosis and hydroureter. No renal or ureteral stones. Bladder is distended. No bladder wall  thickening, mass or stone. Stomach/Bowel: Small hiatal hernia.  Stomach otherwise unremarkable. Small bowel and colon are normal in caliber. No wall thickening or inflammation. Mild to moderate increase in the colonic stool burden. Rectum moderately distended with stool. Vascular/Lymphatic: Aortic atherosclerosis. No aneurysm. No enlarged lymph nodes. Reproductive:  Unremarkable. Other: No ascites.  No hernia. Musculoskeletal: There are compression fractures of T12, L1 and L3, T12 previously treated with vertebroplasty. These fractures are new since the prior CT. Numerous small lucencies are seen throughout the visualized skeleton. Findings consistent with multiple myeloma. PET-CT performed on 12/25/2022 has an indication of initial treatment strategy for multiple myeloma. IMPRESSION: 1. There are vertebral fractures as detailed all new since the CT from 08/03/2021. Only the T12 fracture was evident on an MRI from 11/28/2022. The L1 and L3 fractures may be recent. There are underlying numerous small lucencies throughout the visualized skeleton consistent with multiple myeloma. 2. Significant bladder distension, which is the presumed cause of mild bilateral hydroureteronephrosis. 3. No other acute abnormality within the abdomen or pelvis. 4. Increased colonic stool burden and rectum moderately distended with stool. 5. Aortic atherosclerosis. Electronically Signed   By: Amie Portland M.D.   On: 01/26/2023 16:41        Scheduled Meds:  acyclovir  400 mg Oral BID   aspirin EC  81 mg Oral Daily   Chlorhexidine Gluconate Cloth  6 each Topical Daily   dronabinol  2.5 mg Oral Daily   enoxaparin (LOVENOX) injection  40 mg Subcutaneous Q24H   feeding supplement  237 mL Oral BID BM   levothyroxine  50 mcg Oral QAC breakfast   morphine  15 mg Oral Q12H   multivitamin with minerals  1 tablet Oral Daily   pantoprazole  40 mg Oral Daily   polyethylene glycol  17 g Oral Daily   senna-docusate  2 tablet Oral BID   sodium chloride flush  3 mL Intravenous Q12H   Continuous Infusions:   LOS: 0 days    Time spent: 35 minutes    Delfino Lovett, MD [facility] Pager [phone]  If 7PM-7AM, please contact night-coverage [email or web address] Password [email or web address] 01/27/2023, 12:00 PM

## 2023-01-27 NOTE — TOC Progression Note (Signed)
Transition of Care [facility]) - Progression Note    Patient Details  Name: Melvin Gilmore MRN: [id number] Date of Birth: 03-07-1941  Transition of Care [facility]) CM/SW Contact  Marlowe Sax, RN Phone Number: 01/27/2023, 1:22 PM  Clinical Narrative:       Expected Discharge Plan:  (TBD) Barriers to Discharge: Continued Medical Work up  Expected Discharge Plan and Services    Neurosurgery consult for pathological vertebral fracture-recommending conservative management.  Brace recommended and ordered .  Palliative care consult to be done, TOC to follow for needs and assist with DC planning                                             Social Determinants of Health (SDOH) Interventions SDOH Screenings   Food Insecurity: No Food Insecurity (01/26/2023)  Housing: Low Risk  (01/26/2023)  Transportation Needs: No Transportation Needs (01/26/2023)  Utilities: Not At Risk (01/26/2023)  Depression (PHQ2-9): Low Risk  (11/21/2022)  Financial Resource Strain: Low Risk  (11/21/2022)  Tobacco Use: Low Risk  (01/26/2023)    Readmission Risk Interventions     No data to display

## 2023-01-28 DIAGNOSIS — S32000A Wedge compression fracture of unspecified lumbar vertebra, initial encounter for closed fracture: Secondary | ICD-10-CM | POA: Diagnosis not present

## 2023-01-28 DIAGNOSIS — E871 Hypo-osmolality and hyponatremia: Secondary | ICD-10-CM | POA: Diagnosis not present

## 2023-01-28 DIAGNOSIS — Z515 Encounter for palliative care: Secondary | ICD-10-CM | POA: Diagnosis not present

## 2023-01-28 DIAGNOSIS — R338 Other retention of urine: Secondary | ICD-10-CM | POA: Diagnosis not present

## 2023-01-28 DIAGNOSIS — C9000 Multiple myeloma not having achieved remission: Secondary | ICD-10-CM | POA: Diagnosis not present

## 2023-01-28 DIAGNOSIS — K5903 Drug induced constipation: Secondary | ICD-10-CM | POA: Diagnosis not present

## 2023-01-28 LAB — BASIC METABOLIC PANEL
Anion gap: 8 (ref 5–15)
BUN: 11 mg/dL (ref 8–23)
CO2: 25 mmol/L (ref 22–32)
Calcium: 8.2 mg/dL — ABNORMAL LOW (ref 8.9–10.3)
Chloride: 93 mmol/L — ABNORMAL LOW (ref 98–111)
Creatinine, Ser: 0.73 mg/dL (ref 0.61–1.24)
GFR, Estimated: >60 mL/min (ref >60)
Glucose, Bld: 104 mg/dL — ABNORMAL HIGH (ref 70–99)
Potassium: 3.8 mmol/L (ref 3.5–5.1)
Sodium: 126 mmol/L — ABNORMAL LOW (ref 135–145)

## 2023-01-28 LAB — CBC
HCT: 29.4 % — ABNORMAL LOW (ref 39.0–52.0)
Hemoglobin: 10.3 g/dL — ABNORMAL LOW (ref 13.0–17.0)
MCH: 35 pg — ABNORMAL HIGH (ref 26.0–34.0)
MCHC: 35 g/dL (ref 30.0–36.0)
MCV: 100 fL (ref 80.0–100.0)
Platelets: 170 10*3/uL (ref 150–400)
RBC: 2.94 MIL/uL — ABNORMAL LOW (ref 4.22–5.81)
RDW: 16.1 % — ABNORMAL HIGH (ref 11.5–15.5)
WBC: 3.7 10*3/uL — ABNORMAL LOW (ref 4.0–10.5)
nRBC: 0 % (ref 0.0–0.2)

## 2023-01-28 MED ORDER — SENNOSIDES 8.6 MG PO TABS: 2.0000 | ORAL_TABLET | Freq: Two times a day (BID) | ORAL | 0 refills | Status: DC

## 2023-01-28 MED ORDER — FLEET ENEMA 7-19 GM/118ML RE ENEM
1.0000 | ENEMA | RECTAL | Status: AC
  Administered 2023-01-28: 1 via RECTAL

## 2023-01-28 NOTE — Plan of Care (Signed)

## 2023-01-28 NOTE — Progress Notes (Signed)
Civil engineer, contracting [facility]) Hospital Liaison Note  Received request from Ashby Dawes, RN , Transitions of Care Manager, for hospice services at home after discharge.  Spoke with wife to initiate education related to hospice philosophy, services, and team approach to care. Wife  verbalized understanding of information given.  Per discussion, the plan is for discharge home by private vehicle today.   DME needs discussed.  Patient has the following equipment in the home: Bronwen Betters. Patient/family requests the following equipment in the home: 3-[employer]. The address has been verified and is correct in the chart. Athanasios Heldman and phone number [phone] is the family contact to arrange time of equipment delivery.    Please send signed and completed DNR home with the patient/family.  Please provide prescriptions at discharge as needed to ensure ongoing symptom management.   [facility] information and contact numbers given to wife.   Above information shared with Ashby Dawes, RN, Transitions of Care Manager.     Please call with any Hospice related questions or concerns.  Thank you for the opportunity to participate in this patient's care.  Redge Gainer, [facility] Liaison [phone]

## 2023-01-28 NOTE — IPAL (Signed)
  Interdisciplinary Goals of Care Family Meeting   Date carried out: 01/28/2023  Location of the meeting: Bedside  Member's involved: Physician and Family Member or next of kin  Durable Power of Attorney or Environmental health practitioner: Wife    Discussion: We discussed goals of care for AUGUSTUS ZURAWSKI   Code status:   Code Status: DNR   Disposition: Home with Hospice  Time spent for the meeting: 35 mins    Delfino Lovett, MD  01/28/2023, 9:20 AM

## 2023-01-28 NOTE — TOC Progression Note (Signed)
Transition of Care [facility]) - Progression Note    Patient Details  Name: Melvin Gilmore MRN: [id number] Date of Birth: [birth year]  Transition [facility] Care [facility]) CM/SW Contact  Marlowe Sax, RN Phone Number: 01/28/2023, 1:26 PM  Clinical Narrative:   Spoke with Wife [name] and provided contact information for [employer], [employer] and [facility] services I explained that this is not covered by insurance and that none of these companies are affiliated with [facility] in any way, She stated understanding    Expected Discharge Plan:  (TBD) Barriers to Discharge: Continued Medical Work up  Expected Discharge Plan and Services         Expected Discharge Date: 01/28/23                                     Social Determinants of Health (SDOH) Interventions SDOH Screenings   Food Insecurity: No Food Insecurity (01/26/2023)  Housing: Low Risk  (01/26/2023)  Transportation Needs: No Transportation Needs (01/26/2023)  Utilities: Not At Risk (01/26/2023)  Depression (PHQ2-9): Low Risk  (11/21/2022)  Financial Resource Strain: Low Risk  (11/21/2022)  Tobacco Use: Low Risk  (01/27/2023)    Readmission Risk Interventions     No data to display

## 2023-01-28 NOTE — Plan of Care (Signed)

## 2023-01-29 ENCOUNTER — Encounter: Payer: Self-pay | Admitting: Hospice and Palliative Medicine

## 2023-01-29 NOTE — Telephone Encounter (Signed)
I spoke with patient's wife.  Patient discharged home on hospice.  Spoke with Dr. Alena Bills and virtual visit scheduled on Friday for further discussion.

## 2023-01-29 NOTE — Discharge Summary (Signed)
Physician Discharge Summary   Patient: Melvin Gilmore MRN: [id number] DOB: Dec 13, 1940  Admit date:     01/26/2023  Discharge date: 01/28/2023  Discharge Physician: Delfino Lovett   PCP: [name], MD   Recommendations at discharge:    Home with Hospice  Discharge Diagnoses: Principal Problem:   Acute urinary retention Active Problems:   Vertebral fracture, pathological   Overflow diarrhea   Hyponatremia   Therapeutic opioid induced constipation   Generalized weakness   Multiple myeloma (HCC)   Hypothyroidism   Hypertension   Hospice care   Compression of lumbar vertebra [facility])  Hospital Course: Assessment and Plan:  82 y.o. male with medical history significant of multiple myeloma stage II currently on on daratumumab and Revlimid and s/p recent radiation therapy, multiple pathologic spinal fractures s/p kyphoplasty, hypertension, hyperlipidemia, Mnire disease, normocytic anemia, anxiety disorder on Xanax, who presents to the ED due to worsening weakness, nausea, vomiting and diarrhea.  He was found to have acute urinary retention requiring Foley.   7/8: Neurosurgery consult for pathological vertebral fracture-conservative management.  Brace recommended.  Palliative care consult    Assessment & Plan:   Principal Problem:   Acute urinary retention Active Problems:   Vertebral fracture, pathological   Overflow diarrhea   Hyponatremia   Therapeutic opioid induced constipation   Generalized weakness   Multiple myeloma (HCC)   Hypothyroidism   Hypertension   * Acute urinary retention Patient presented with nausea and inability to urinate with marked urinary retention seen on CT imaging. Over 1.3L out once foley was placed.     - Evaluated by neurosurgery who do not think this is from spinal lesion or cord compression - Foley catheter placed; Foley continued at discharge due to bladder stretch injury -Outpatient urology evaluation if need   Vertebral fracture,  pathological Patient presenting with increased lower back pain compared with chronic back pain with evidence of new L1 and L3 fractures and multiple previous compression fractures secondary to multiple myeloma. - s/p kyphoplasty by Dr. Elijio Miles on 12/06/22.  No improvement in pain - Completed palliative RT on 01/20/2023.  Denies any improvement yet. - Neurosurgery recommends conservative management with brace - Pain control remains biggest challenge and family is requesting Hospice service   # Multiple myeloma, Stage II -MRI thoracic and lumbar spine with and without contrast shows subacute compression fracture at T12.  Focal enhancing marrow replacing lesions in T6, T8, T5, T2 all suspicious for metastatic disease.  L1 lesion measuring 16 mm.  Multilevel degenerative changes present. - s/p recent radiation therapy that was palliative nature.  Revlimid hide has been on hold but he is still on daratumumab.   # Intractable hiccups -Hiccups start after cancer treatment and last for about 12 to 24 hours.  Started with second cycle also but now resolved   # Opioid-induced constipation -On bowel regimen   # History of prostate cancer - follows with Dr. Evelene Croon, urology for elevated PSA.  MRI prostate showed 1 mm category 5 lesion of anterior transition zone.  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 s/p HIFU of the prostate on 02/07/2022.  PSA level from 10/08/2022 was 3.34.  -PSA from 11/21/2022 3.44.   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   # Poor appetite # Weight loss -likely  from cancer and uncontrolled pain. -He is taking Marinol 2.5 mg once daily.  Could not tolerate twice daily dose because  it was making him feel hyper at night and inability to sleep.  Not noticing any improvement yet   # Anxiety -On longstanding Xanax 0.25 mg at night as needed. -Anxiety has been heightened with the  recent diagnosis, pain, poor appetite.    Overflow diarrhea Likely multifactorial in the setting of opioid-induced constipation and bladder distention leading to possible obstruction.  Bladder has been decompressed at this time.    - GI panel negative   Hyponatremia Likely due to poor p.o. intake and severe diarrhea over the last 24 hours.  History of chronic hyponatremia with baseline sodium around 129.  123 on admission-> 129 at DC  Generalized weakness multifactorial but acutely worsened by GI fluid losses.  Continue IV fluid resuscitation.  No focal weakness noted on examination.   Hypothyroidism - Continue home Synthroid   Hypertension Not on medications       Consultants: Palliative care Disposition: Hospice care Diet recommendation:  Discharge Diet Orders (From admission, onward)     Start     Ordered   01/28/23 0000  Diet - low sodium heart healthy        01/28/23 1028           Carb modified diet DISCHARGE MEDICATION: Allergies as of 01/28/2023       Reactions   Amoxicillin-pot Clavulanate Rash, Other (See Comments)   Pt states he can't recall what the reactions were   Meloxicam Rash, Other (See Comments)        Medication List     STOP taking these medications    lenalidomide 5 MG capsule Commonly known as: REVLIMID   omeprazole 40 MG capsule Commonly known as: PRILOSEC   predniSONE 10 MG tablet Commonly known as: DELTASONE       TAKE these medications    acetaminophen 500 MG tablet Commonly known as: TYLENOL Take 500 mg by mouth every 6 (six) hours as needed for mild pain, moderate pain, fever or headache.   acyclovir 400 MG tablet Commonly known as: ZOVIRAX Take 400 mg by mouth 2 (two) times daily.   ALPRAZolam 0.25 MG tablet Commonly known as: XANAX Take 1 tablet (0.25 mg total) by mouth at bedtime as needed for anxiety.   aspirin EC 81 MG tablet Take 1 tablet (81 mg total) by mouth daily. Swallow whole.   cetirizine 10 MG  tablet Commonly known as: ZYRTEC Take 10 mg by mouth as needed for allergies.   docusate sodium 100 MG capsule Commonly known as: COLACE Take 2 capsules (200 mg total) by mouth 2 (two) times daily. What changed:  when to take this reasons to take this   dronabinol 2.5 MG capsule Commonly known as: MARINOL Take 1 capsule (2.5 mg total) by mouth 2 (two) times daily before a meal. What changed: when to take this   fluticasone 50 MCG/ACT nasal spray Commonly known as: FLONASE Place 1 spray into both nostrils daily as needed for rhinitis or allergies.   gabapentin 100 MG capsule Commonly known as: Neurontin Take 1 capsule (100 mg total) by mouth 3 (three) times daily. What changed:  when to take this reasons to take this   lactulose 10 GM/15ML solution Commonly known as: CHRONULAC Take 10-20 GM (15-30 ml) one - two times a day as needed for constipation   levothyroxine 50 MCG tablet Commonly known as: SYNTHROID Take 50 mcg by mouth daily before breakfast.   naloxone 4 MG/0.1ML Liqd nasal spray kit Commonly known as: NARCAN SPRAY 1 SPRAY  INTO ONE NOSTRIL AS DIRECTED FOR OPIOID OVERDOSE (TURN PERSON ON SIDE AFTER DOSE. IF NO RESPONSE IN 2-3 MINUTES OR PERSON RESPONDS BUT RELAPSES, REPEAT USING A NEW SPRAY DEVICE AND SPRAY INTO THE OTHER NOSTRIL. CALL 911 AFTER USE.) * EMERGENCY USE ONLY *   ondansetron 8 MG tablet Commonly known as: Zofran Take 1 tablet (8 mg total) by mouth every 8 (eight) hours as needed for nausea or vomiting.   oxyCODONE 5 MG immediate release tablet Commonly known as: Oxy IR/ROXICODONE Take 1-2 tablets (5-10 mg total) by mouth every 4 (four) hours as needed for severe pain.   pantoprazole 40 MG tablet Commonly known as: PROTONIX Take 40 mg by mouth daily as needed.   PROBIOTIC ADVANCED PO Take 1 tablet by mouth at bedtime.   senna 8.6 MG tablet Commonly known as: Senokot Take 2 tablets (17.2 mg total) by mouth 2 (two) times daily. May crush, mix  with water and give sublingually if needed.        Follow-up Information     Harlon Flor, [employer], PA-C. Schedule an appointment as soon as possible for a visit on 02/03/2023.   Specialty: Family Medicine Why: [facility] Discharge F/UP at 1045 Contact information: [address] [place] [address] [phone]                Discharge Exam: Ceasar Mons Weights   01/26/23 1219  Weight: 61.2 kg   General exam: Cachectic looking male, calm and comfortable  Respiratory system: Clear to auscultation. Respiratory effort normal. Cardiovascular system: S1 & S2 heard, RRR. No JVD, murmurs, rubs, gallops or clicks. No pedal edema. Gastrointestinal system: Abdomen is nondistended, soft and nontender. No organomegaly or masses felt. Normal bowel sounds heard. Central nervous system: Alert and oriented. No focal neurological deficits. Extremities: Symmetric 5 x 5 power. Skin: No rashes, lesions or ulcers Psychiatry: Judgement and insight appear normal. Mood & affect appropriate.   Condition at discharge: poor  The results of significant diagnostics from this hospitalization (including imaging, microbiology, ancillary and laboratory) are listed below for reference.   Imaging Studies: MR CERVICAL SPINE W WO CONTRAST  Result Date: 01/26/2023 CLINICAL DATA:  Metastatic disease evaluation EXAM: MRI CERVICAL, THORACIC AND LUMBAR SPINE WITHOUT AND WITH CONTRAST TECHNIQUE: Multiplanar and multiecho pulse sequences of the cervical spine, to include the craniocervical junction and cervicothoracic junction, and thoracic and lumbar spine, were obtained without and with intravenous contrast. CONTRAST:  6mL GADAVIST GADOBUTROL 1 MMOL/ML IV SOLN COMPARISON:  None Available. FINDINGS: MRI CERVICAL SPINE FINDINGS Alignment: Grade 1 C4-5 anterolisthesis and grade 1 C5-6 retrolisthesis Vertebrae: No fracture, evidence of discitis, or bone lesion. Cord: Normal signal and morphology. Posterior  Fossa, vertebral arteries, paraspinal tissues: Negative. Disc levels: C1-2: Unremarkable. C2-3: Small disc bulge with right-greater-than-left uncovertebral hypertrophy. There is no spinal canal stenosis. Moderate right neural foraminal stenosis. C3-4: Small disc bulge with uncovertebral spurring and right-greater-than-left facet hypertrophy. There is no spinal canal stenosis. Severe right and mild left neural foraminal stenosis. C4-5: Severe right facet arthrosis. There is no spinal canal stenosis. Mild right neural foraminal stenosis. C5-6: Disc bulge with bilateral uncovertebral hypertrophy. Mild spinal canal stenosis. Severe bilateral neural foraminal stenosis. C6-7: Left asymmetric disc bulge with endplate spurring. Mild spinal canal stenosis. Severe left neural foraminal stenosis. C7-T1: Normal disc space and facet joints. There is no spinal canal stenosis. No neural foraminal stenosis. MRI THORACIC SPINE FINDINGS Alignment:  Physiologic. Vertebrae: There is superior endplate signal abnormalities at T8 and T9, likely  minimally depressed compression fractures. There is abnormal signal within the inferior half of T11. Chronic compression deformity of T12 with augmentation cement. There is contrast enhancement at T9, T8 and T11 but no visualized soft tissue component. Cord:  Normal signal and morphology. Paraspinal and other soft tissues: Negative. Disc levels: No spinal canal or neural foraminal stenosis. MRI LUMBAR SPINE FINDINGS Segmentation:  Standard. Alignment:  Physiologic. Vertebrae: Compression deformities at L1 and L3 with mild bone marrow edema. No soft tissue mass. Conus medullaris and cauda equina: Conus extends to the L1 level. Conus and cauda equina appear normal. Paraspinal and other soft tissues: Negative Disc levels: L1-L2: Normal disc space and facet joints. No spinal canal stenosis. No neural foraminal stenosis. L2-L3: Small disc bulge with mild facet hypertrophy. No spinal canal stenosis. No  neural foraminal stenosis. L3-L4: Right asymmetric disc bulge. Mild spinal canal stenosis. Severe narrowing of the right lateral recess and mild left lateral recess narrowing. Mild right neural foraminal stenosis. L4-L5: Small disc bulge with endplate spurring. Right lateral recess narrowing without central spinal canal stenosis. Mild right and moderate left neural foraminal stenosis. L5-S1: Normal disc space and facet joints. No spinal canal stenosis. No neural foraminal stenosis. Visualized sacrum: Normal. IMPRESSION: 1. Acute to subacute compression fractures of T8, T9, L1 and L3 with mild bone marrow edema and enhancement. There are no specific imaging features to indicate a neoplastic process; but, given history of myeloma, myelomatous involvement is probable. 2. 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 3. Mild C5-6 and C6-7 spinal canal stenosis. 4. Severe right lateral recess and mild left lateral recess narrowing at L3-4. 5. Moderate left L4-5 neural foraminal stenosis. Electronically Signed   By: Deatra Robinson M.D.   On: 01/26/2023 20:05   MR Lumbar Spine W Wo Contrast  Result Date: 01/26/2023 CLINICAL DATA:  Metastatic disease evaluation EXAM: MRI CERVICAL, THORACIC AND LUMBAR SPINE WITHOUT AND WITH CONTRAST TECHNIQUE: Multiplanar and multiecho pulse sequences of the cervical spine, to include the craniocervical junction and cervicothoracic junction, and thoracic and lumbar spine, were obtained without and with intravenous contrast. CONTRAST:  6mL GADAVIST GADOBUTROL 1 MMOL/ML IV SOLN COMPARISON:  None Available. FINDINGS: MRI CERVICAL SPINE FINDINGS Alignment: Grade 1 C4-5 anterolisthesis and grade 1 C5-6 retrolisthesis Vertebrae: No fracture, evidence of discitis, or bone lesion. Cord: Normal signal and morphology. Posterior Fossa, vertebral arteries, paraspinal tissues: Negative. Disc levels: C1-2: Unremarkable. C2-3: Small disc bulge with right-greater-than-left  uncovertebral hypertrophy. There is no spinal canal stenosis. Moderate right neural foraminal stenosis. C3-4: Small disc bulge with uncovertebral spurring and right-greater-than-left facet hypertrophy. There is no spinal canal stenosis. Severe right and mild left neural foraminal stenosis. C4-5: Severe right facet arthrosis. There is no spinal canal stenosis. Mild right neural foraminal stenosis. C5-6: Disc bulge with bilateral uncovertebral hypertrophy. Mild spinal canal stenosis. Severe bilateral neural foraminal stenosis. C6-7: Left asymmetric disc bulge with endplate spurring. Mild spinal canal stenosis. Severe left neural foraminal stenosis. C7-T1: Normal disc space and facet joints. There is no spinal canal stenosis. No neural foraminal stenosis. MRI THORACIC SPINE FINDINGS Alignment:  Physiologic. Vertebrae: There is superior endplate signal abnormalities at T8 and T9, likely minimally depressed compression fractures. There is abnormal signal within the inferior half of T11. Chronic compression deformity of T12 with augmentation cement. There is contrast enhancement at T9, T8 and T11 but no visualized soft tissue component. Cord:  Normal signal and morphology. Paraspinal and other soft tissues: Negative. Disc levels: No spinal canal or neural foraminal stenosis. MRI  LUMBAR SPINE FINDINGS Segmentation:  Standard. Alignment:  Physiologic. Vertebrae: Compression deformities at L1 and L3 with mild bone marrow edema. No soft tissue mass. Conus medullaris and cauda equina: Conus extends to the L1 level. Conus and cauda equina appear normal. Paraspinal and other soft tissues: Negative Disc levels: L1-L2: Normal disc space and facet joints. No spinal canal stenosis. No neural foraminal stenosis. L2-L3: Small disc bulge with mild facet hypertrophy. No spinal canal stenosis. No neural foraminal stenosis. L3-L4: Right asymmetric disc bulge. Mild spinal canal stenosis. Severe narrowing of the right lateral recess and  mild left lateral recess narrowing. Mild right neural foraminal stenosis. L4-L5: Small disc bulge with endplate spurring. Right lateral recess narrowing without central spinal canal stenosis. Mild right and moderate left neural foraminal stenosis. L5-S1: Normal disc space and facet joints. No spinal canal stenosis. No neural foraminal stenosis. Visualized sacrum: Normal. IMPRESSION: 1. Acute to subacute compression fractures of T8, T9, L1 and L3 with mild bone marrow edema and enhancement. There are no specific imaging features to indicate a neoplastic process; but, given history of myeloma, myelomatous involvement is probable. 2. 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 3. Mild C5-6 and C6-7 spinal canal stenosis. 4. Severe right lateral recess and mild left lateral recess narrowing at L3-4. 5. Moderate left L4-5 neural foraminal stenosis. Electronically Signed   By: Deatra Robinson M.D.   On: 01/26/2023 20:05   MR THORACIC SPINE W WO CONTRAST  Result Date: 01/26/2023 CLINICAL DATA:  Metastatic disease evaluation EXAM: MRI CERVICAL, THORACIC AND LUMBAR SPINE WITHOUT AND WITH CONTRAST TECHNIQUE: Multiplanar and multiecho pulse sequences of the cervical spine, to include the craniocervical junction and cervicothoracic junction, and thoracic and lumbar spine, were obtained without and with intravenous contrast. CONTRAST:  6mL GADAVIST GADOBUTROL 1 MMOL/ML IV SOLN COMPARISON:  None Available. FINDINGS: MRI CERVICAL SPINE FINDINGS Alignment: Grade 1 C4-5 anterolisthesis and grade 1 C5-6 retrolisthesis Vertebrae: No fracture, evidence of discitis, or bone lesion. Cord: Normal signal and morphology. Posterior Fossa, vertebral arteries, paraspinal tissues: Negative. Disc levels: C1-2: Unremarkable. C2-3: Small disc bulge with right-greater-than-left uncovertebral hypertrophy. There is no spinal canal stenosis. Moderate right neural foraminal stenosis. C3-4: Small disc bulge with uncovertebral  spurring and right-greater-than-left facet hypertrophy. There is no spinal canal stenosis. Severe right and mild left neural foraminal stenosis. C4-5: Severe right facet arthrosis. There is no spinal canal stenosis. Mild right neural foraminal stenosis. C5-6: Disc bulge with bilateral uncovertebral hypertrophy. Mild spinal canal stenosis. Severe bilateral neural foraminal stenosis. C6-7: Left asymmetric disc bulge with endplate spurring. Mild spinal canal stenosis. Severe left neural foraminal stenosis. C7-T1: Normal disc space and facet joints. There is no spinal canal stenosis. No neural foraminal stenosis. MRI THORACIC SPINE FINDINGS Alignment:  Physiologic. Vertebrae: There is superior endplate signal abnormalities at T8 and T9, likely minimally depressed compression fractures. There is abnormal signal within the inferior half of T11. Chronic compression deformity of T12 with augmentation cement. There is contrast enhancement at T9, T8 and T11 but no visualized soft tissue component. Cord:  Normal signal and morphology. Paraspinal and other soft tissues: Negative. Disc levels: No spinal canal or neural foraminal stenosis. MRI LUMBAR SPINE FINDINGS Segmentation:  Standard. Alignment:  Physiologic. Vertebrae: Compression deformities at L1 and L3 with mild bone marrow edema. No soft tissue mass. Conus medullaris and cauda equina: Conus extends to the L1 level. Conus and cauda equina appear normal. Paraspinal and other soft tissues: Negative Disc levels: L1-L2: Normal disc space and facet joints. No spinal  canal stenosis. No neural foraminal stenosis. L2-L3: Small disc bulge with mild facet hypertrophy. No spinal canal stenosis. No neural foraminal stenosis. L3-L4: Right asymmetric disc bulge. Mild spinal canal stenosis. Severe narrowing of the right lateral recess and mild left lateral recess narrowing. Mild right neural foraminal stenosis. L4-L5: Small disc bulge with endplate spurring. Right lateral recess  narrowing without central spinal canal stenosis. Mild right and moderate left neural foraminal stenosis. L5-S1: Normal disc space and facet joints. No spinal canal stenosis. No neural foraminal stenosis. Visualized sacrum: Normal. IMPRESSION: 1. Acute to subacute compression fractures of T8, T9, L1 and L3 with mild bone marrow edema and enhancement. There are no specific imaging features to indicate a neoplastic process; but, given history of myeloma, myelomatous involvement is probable. 2. 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 3. Mild C5-6 and C6-7 spinal canal stenosis. 4. Severe right lateral recess and mild left lateral recess narrowing at L3-4. 5. Moderate left L4-5 neural foraminal stenosis. Electronically Signed   By: Deatra Robinson M.D.   On: 01/26/2023 20:05   CT ABDOMEN PELVIS W CONTRAST  Result Date: 01/26/2023 CLINICAL DATA:  Abdominal pain.  Weakness. EXAM: CT ABDOMEN AND PELVIS WITH CONTRAST TECHNIQUE: Multidetector CT imaging of the abdomen and pelvis was performed using the standard protocol following bolus administration of intravenous contrast. RADIATION DOSE REDUCTION: This exam was performed according to the departmental dose-optimization program which includes automated exposure control, adjustment of the mA and/or kV according to patient size and/or use of iterative reconstruction technique. CONTRAST:  80mL OMNIPAQUE IOHEXOL 300 MG/ML  SOLN COMPARISON:  08/03/2021. FINDINGS: Lower chest: No acute findings. 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 Hepatobiliary: Liver normal in size. 2 cysts in segment 2, smaller than on the prior CT. Largest measures 2 cm. No other liver masses or lesions. Status post cholecystectomy. No bile duct dilation. Pancreas: Unremarkable. No pancreatic ductal dilatation or surrounding inflammatory changes. Spleen: Normal in size without focal abnormality. Adrenals/Urinary Tract: Normal adrenal  glands. Kidneys normal in size, orientation and position with symmetric enhancement and excretion. 1.8 cm posterior midpole right renal cyst, stable with no follow-up indicated. No other renal masses. Mild bilateral hydronephrosis and hydroureter. No renal or ureteral stones. Bladder is distended. No bladder wall thickening, mass or stone. Stomach/Bowel: Small hiatal hernia.  Stomach otherwise unremarkable. Small bowel and colon are normal in caliber. No wall thickening or inflammation. Mild to moderate increase in the colonic stool burden. Rectum moderately distended with stool. Vascular/Lymphatic: Aortic atherosclerosis. No aneurysm. No enlarged lymph nodes. Reproductive: Unremarkable. Other: No ascites.  No hernia. Musculoskeletal: There are compression fractures of T12, L1 and L3, T12 previously treated with vertebroplasty. These fractures are new since the prior CT. Numerous small lucencies are seen throughout the visualized skeleton. Findings consistent with multiple myeloma. PET-CT performed on 12/25/2022 has an indication of initial treatment strategy for multiple myeloma. IMPRESSION: 1. There are vertebral fractures as detailed all new since the CT from 08/03/2021. Only the T12 fracture was evident on an MRI from 11/28/2022. The L1 and L3 fractures may be recent. There are underlying numerous small lucencies throughout the visualized skeleton consistent with multiple myeloma. 2. Significant bladder distension, which is the presumed cause of mild bilateral hydroureteronephrosis. 3. No other acute abnormality within the abdomen or pelvis. 4. Increased colonic stool burden and rectum moderately distended with stool. 5. Aortic atherosclerosis. Electronically Signed   By: Amie Portland M.D.   On: 01/26/2023 16:41  Microbiology: Results for orders placed or performed during the hospital encounter of 01/26/23  Gastrointestinal Panel by PCR , Stool     Status: None   Collection Time: 01/27/23 12:12 AM    Specimen: Stool  Result Value Ref Range Status   Campylobacter species NOT DETECTED NOT DETECTED Final   Plesimonas shigelloides NOT DETECTED NOT DETECTED Final   Salmonella species NOT DETECTED NOT DETECTED Final   Yersinia enterocolitica NOT DETECTED NOT DETECTED Final   Vibrio species NOT DETECTED NOT DETECTED Final   Vibrio cholerae NOT DETECTED NOT DETECTED Final   Enteroaggregative E coli (EAEC) NOT DETECTED NOT DETECTED Final   Enteropathogenic E coli (EPEC) NOT DETECTED NOT DETECTED Final   Enterotoxigenic E coli (ETEC) NOT DETECTED NOT DETECTED Final   Shiga like toxin producing E coli (STEC) NOT DETECTED NOT DETECTED Final   Shigella/Enteroinvasive E coli (EIEC) NOT DETECTED NOT DETECTED Final   Cryptosporidium NOT DETECTED NOT DETECTED Final   Cyclospora cayetanensis NOT DETECTED NOT DETECTED Final   Entamoeba histolytica NOT DETECTED NOT DETECTED Final   Giardia lamblia NOT DETECTED NOT DETECTED Final   Adenovirus F40/41 NOT DETECTED NOT DETECTED Final   Astrovirus NOT DETECTED NOT DETECTED Final   Norovirus GI/GII NOT DETECTED NOT DETECTED Final   Rotavirus A NOT DETECTED NOT DETECTED Final   Sapovirus (I, II, IV, and V) NOT DETECTED NOT DETECTED Final    Comment: Performed at [facility], [address] [place], [place] [address]  C Difficile Quick Screen w PCR reflex     Status: None   Collection Time: 01/27/23 12:12 AM   Specimen: STOOL  Result Value Ref Range Status   C Diff antigen NEGATIVE NEGATIVE Final   C Diff toxin NEGATIVE NEGATIVE Final   C Diff interpretation No C. difficile detected.  Final    Comment: Performed at [facility], [address] [place], [place] [address]    Labs: CBC: Recent Labs  Lab 01/24/23 0901 01/26/23 1221 01/27/23 0625 01/28/23 0546  WBC 2.1* 5.5 3.7* 3.7*  NEUTROABS 1.1*  --   --   --   HGB 11.2* 11.0* 10.3* 10.3*  HCT 31.6* 30.8* 29.2* 29.4*  MCV 98.8 99.0 100.3* 100.0  PLT 173 185 170 170    Basic Metabolic Panel: Recent Labs  Lab 01/26/23 1221 01/26/23 2147 01/27/23 0625 01/28/23 0546  NA 123* 127* 129* 126*  K 3.9 4.2 3.8 3.8  CL 93* 97* 99 93*  CO2 20* 23 24 25   GLUCOSE 111* 108* 96 104*  BUN 17 14 12 11   CREATININE 0.87 0.94 0.90 0.73  CALCIUM 8.1* 8.6* 8.2* 8.2*  MG  --  2.3 2.6*  --    Liver Function Tests: Recent Labs  Lab 01/26/23 1221  AST 23  ALT 28  ALKPHOS 127*  BILITOT 1.4*  PROT 6.2*  ALBUMIN 3.7   CBG: No results for input(s): "GLUCAP" in the last 168 hours.  Discharge time spent: greater than 30 minutes.  Signed: Delfino Lovett, MD [facility] 01/29/2023

## 2023-01-30 ENCOUNTER — Ambulatory Visit: Payer: Medicare HMO | Admitting: Internal Medicine

## 2023-01-30 ENCOUNTER — Other Ambulatory Visit: Payer: Medicare HMO

## 2023-01-30 ENCOUNTER — Ambulatory Visit: Payer: Medicare HMO

## 2023-01-30 ENCOUNTER — Inpatient Hospital Stay: Payer: Medicare HMO

## 2023-01-31 ENCOUNTER — Inpatient Hospital Stay: Payer: Medicare HMO | Admitting: Internal Medicine

## 2023-01-31 ENCOUNTER — Ambulatory Visit: Payer: Medicare HMO

## 2023-01-31 ENCOUNTER — Inpatient Hospital Stay: Payer: Medicare HMO

## 2023-01-31 ENCOUNTER — Inpatient Hospital Stay (HOSPITAL_BASED_OUTPATIENT_CLINIC_OR_DEPARTMENT_OTHER): Payer: Medicare HMO | Admitting: Hospice and Palliative Medicine

## 2023-01-31 ENCOUNTER — Encounter: Payer: Self-pay | Admitting: Internal Medicine

## 2023-01-31 DIAGNOSIS — R630 Anorexia: Secondary | ICD-10-CM

## 2023-01-31 DIAGNOSIS — C9000 Multiple myeloma not having achieved remission: Secondary | ICD-10-CM

## 2023-01-31 DIAGNOSIS — S32000K Wedge compression fracture of unspecified lumbar vertebra, subsequent encounter for fracture with nonunion: Secondary | ICD-10-CM | POA: Diagnosis not present

## 2023-01-31 DIAGNOSIS — G893 Neoplasm related pain (acute) (chronic): Secondary | ICD-10-CM

## 2023-01-31 DIAGNOSIS — R338 Other retention of urine: Secondary | ICD-10-CM

## 2023-01-31 DIAGNOSIS — Z515 Encounter for palliative care: Secondary | ICD-10-CM

## 2023-01-31 NOTE — Progress Notes (Signed)
I called and spoke with patient's wife.  She reports that patient is much improved since discharging from the hospital.  Pain is stable on MS Contin.  They had enrolled in hospice but after speaking with Dr. Alena Bills, patient and family would like to revoke hospice to pursue further treatments.

## 2023-01-31 NOTE — Progress Notes (Signed)
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     Cycles 1 and 2: A cycle is every 28 days:     Lenalidomide      Dexamethasone      Daratumumab and hyaluronidase-fihj    Cycles 3 through 6: A cycle is every 28 days:     Lenalidomide      Dexamethasone      Daratumumab and hyaluronidase-fihj    Cycles 7 and beyond: A cycle is every 28 days:     Lenalidomide      Dexamethasone      Daratumumab and hyaluronidase-fihj   **Always confirm dose/schedule in your pharmacy ordering system**  REASON: Toxicities / Adverse Event PRIOR TREATMENT: [id number]: DaraRd - Subcutaneous Daratumumab (Daratumumab/hyaluronidase SUBQ + Lenalidomide 25 mg PO + Dexamethasone 40 mg PO/IV) q28 Days Until Progression or Unacceptable Toxicity TREATMENT RESPONSE: Unable to Evaluate  START ON PATHWAY REGIMEN - Multiple Myeloma and Other Plasma Cell Dyscrasias     A cycle is every 21 days:     Bortezomib      Lenalidomide      Dexamethasone   **Always confirm dose/schedule in your pharmacy ordering system**  Patient Characteristics: Multiple Myeloma, Newly Diagnosed, Transplant Ineligible or Refused, High Risk Disease Classification: Multiple Myeloma Therapeutic Status: Newly Diagnosed R2-ISS Staging: II Is Patient Eligible for Transplant<= Transplant Ineligible or Refused Risk Status: High Risk Intent of Therapy: Non-Curative / Palliative Intent, Discussed with Patient

## 2023-01-31 NOTE — Progress Notes (Signed)
Spoke with the patient's wife and she says that he was admitted into the hospital over the weekend with bad diarrhea, and low grade fever. They sent him home with a catheter, and they wanted to know where or who could take that out for him. Wife told me how the doctor there at the hospital said that he should probably stop treatment and have hospice come in. So they both want to discuss that with Dr. Alena Bills and Sharia Reeve because he isn't ready to give up yet!

## 2023-01-31 NOTE — Progress Notes (Deleted)
ON PATHWAY REGIMEN - Multiple Myeloma and Other Plasma Cell Dyscrasias  No Change  Continue With Treatment as Ordered.  Original Decision Date/Time: 12/13/2022 14:52     Cycles 1 and 2: A cycle is every 28 days:     Lenalidomide      Dexamethasone      Daratumumab and hyaluronidase-fihj    Cycles 3 through 6: A cycle is every 28 days:     Lenalidomide      Dexamethasone      Daratumumab and hyaluronidase-fihj    Cycles 7 and beyond: A cycle is every 28 days:     Lenalidomide      Dexamethasone      Daratumumab and hyaluronidase-fihj   **Always confirm dose/schedule in your pharmacy ordering system**  Patient Characteristics: Multiple Myeloma, Newly Diagnosed, Transplant Ineligible or Refused, High Risk Disease Classification: Multiple Myeloma Therapeutic Status: Newly Diagnosed R2-ISS Staging: Awaiting Test Results Is Patient Eligible for Transplant<= Transplant Ineligible or Refused Risk Status: High Risk Intent of Therapy: Non-Curative / Palliative Intent, Discussed with Patient

## 2023-01-31 NOTE — Progress Notes (Signed)
[facility] CONSULT NOTE  Patient Care Team: [name], MD as PCP - General (Internal Medicine)  I connected with [name] on 01/31/23 at 10:15 AM EDT by my chart video and verified that I am speaking with the correct person using two identifiers.   I discussed the limitations, risks, security and privacy concerns of performing an evaluation and management service by telemedicine and the availability of in-person appointments. I also discussed with the patient that there may be a patient responsible charge related to this service. The patient expressed understanding and agreed to proceed.   Other persons participating in the visit and their role in the encounter: Wife  Patient's location: Home Provider's location: Clinic  Chief Complaint: Hospital follow-up   CANCER STAGING   Cancer Staging  Multiple myeloma [facility]) Staging form: Plasma Cell Myeloma and Plasma Cell Disorders, AJCC 8th Edition - Clinical stage from 12/05/2022: RISS Stage II (Beta-2-microglobulin (mg/L): 3.7, Albumin (g/dL): 3.6, ISS: Stage II, High-risk cytogenetics: Absent, LDH: Normal) - Signed by [name], MD on 12/26/2022 Stage prefix: Initial diagnosis Beta 2 microglobulin range (mg/L): 3.5 to 5.49 Albumin range (g/dL): Greater than or equal to 3.5 Cytogenetics: 1q addition, Other mutation   ASSESSMENT & PLAN:  KAHLEN HEYER 82 y.o. male with pmh of hypertension, hyperlipidemia, anxiety, GERD, BPH, hypothyroidism, prostate cancer s/p HIFU was seen by primary on November 13, 2022 for acute onset of lower back pain.  Was referred to medical oncology for finding of L1 lesion suspicious for metastasis.  # Multiple myeloma, RISS Stage II -MRI thoracic and lumbar spine with and without contrast was reviewed. Showed unchanged subacute compression fracture at T12.  Focal enhancing marrow replacing lesions in T6, T8, T5, T2 all suspicious for metastatic disease.  L1 lesion measuring 16 mm.   Multilevel degenerative changes present.  -SPEP/IFE showed biclonal IgA kappa paraprotein with M spike 2.1 g/dL and second spike at 0.4 g/dL.  Kappa 453, lambda 6.8 with a ratio of 66.7.  Iron panel and B12 are normal.  PSA 3.44.  CBC showed mild anemia 13.4, chronic.  On CMP normal creatinine and calcium levels.  Elevated total protein 8.8.  LDH normal.  Beta-2 microglobulin 3.7.  Albumin 3.6.  24-hour urine/UPEP showed 1.1 g protein with no M spike.  -BMBx Hypercellular marrow with 60% plasma cells.  IHC positive for CD138/ mum 1.  Kappa restricted consistent with multiple myeloma. L1 vertebral body biopsy showed numerous plasma cells. Cytogenetics - del (13q) and gain 1.    -PET/CT showed activity in L1 and left eighth rib concerning for myeloma.  -Started on Dara-RD on 12/26/2022.  He is neutropenic from Revlimid.  Revlimid 10 mg held on 01/13/2023.  I have dose reduced Revlimid to 5 mg 3 weeks on 1 week off. -Patient is having issues with intractable hiccups lasting 12 to 24 hours after the treatment.  We offered baclofen which he is hesitant to try.  Last week we switched p.o. Decadron to IV (did not have hiccups the first time when he had IV Decadron) but still had hiccups.  Potential causes include daratumumab and Decadron.  Patient is very concerned this is all coming from the area injection.  I will switch the treatment plan to Velcade and will assess for toxicity.  If he continues to have hiccups, then I think the potential culprit may be Decadron.  -Showing response to treatment.  Kappa chains decreasing and M spike decreased from 2.1 to 1.2 g/dL.  #  Diarrhea # Acute urinary retention likely secondary to stool burden - Admitted to the hospital on July 7.  CT abdomen pelvis showed compression fractures of T12, L1 and L3.  T12 previously treated with vertebroplasty.  The fractures are new since the prior CT.  Significant bladder distention with mild bilateral hydroureteronephrosis.  Increased  colonic stool burden and the rectum moderately distended with stool.  He was treated with stool regimen and pain medications were liberalized.  Was discharged on Foley catheter.  The primary team in the hospital advised hospice and after meeting with them he was enrolled.  Today when I spoke with the patient and his wife, they report that he is feeling much better and his pain is better controlled.  They are not ready to give up yet and would like to revoke hospice.  I have updated Josh who will reach out to the hospice team.  -Patient is scheduled for a visit with Dr. Lonna Cobb on August 1.  I discussed with urology and they do not have sooner appointments.  # Intractable hiccups -Potential etiology include Decadron or daratumumab. -As above  # Vertebral compression fractures -Plan to start on Zometa once he is overall more stabilized with the primary treatment and pain control.  # T12 pathological fracture # Cancer-related pain - s/p kyphoplasty by Dr. Elijio Miles on 12/06/22.  No improvement in pain -Completed palliative RT on 01/20/2023.  Denies any improvement yet. -Patient started on MS Contin 15 mg twice daily.  But only taking at night and mentions pain is better controlled.  # Opioid-induced constipation -Manageable with senna, MiraLAX, lactulose, Movantik as needed.  # History of prostate cancer - follows with Dr. Evelene Croon, urology for elevated PSA.  MRI prostate showed 1 mm category 5 lesion of anterior transition zone.  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 s/p HIFU of the prostate on 02/07/2022.  PSA level from 10/08/2022 was 3.34.  -PSA from 11/21/2022 3.44.  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  # Poor appetite # Weight loss -Could be from cancer and uncontrolled pain.  Pain management as above. Nutrition following. -He is taking Marinol 2.5 mg once daily.  Could not tolerate  twice daily dose because it was making him feel hyper at night and inability to sleep.  Not noticing any improvement yet  # Anxiety -On longstanding Xanax 0.25 mg at night as needed.  Requesting for refill. -Anxiety has been heightened with the recent diagnosis, pain, poor appetite.  Will send for refill.  Orders Placed This Encounter  Procedures   CBC with Differential (Cancer Center Only)    Standing Status:   Future    Standing Expiration Date:   02/07/2024   CMP (Cancer Center only)    Standing Status:   Future    Standing Expiration Date:   02/07/2024   RTC in 1 week for MD visit, labs, Velcade  The total time spent in the appointment was 30 minutes encounter with patients including review of chart and various tests results, discussions about plan of care and coordination of care plan   All questions were answered. The patient knows to call the clinic with any problems, questions or concerns. No barriers to learning was detected.  [name], MD 7/12/20243:19 PM   HISTORY OF PRESENTING ILLNESS:  KISHON GUANZON 82 y.o. male with past medical history of hypertension, hyperlipidemia, anxiety, GERD, BPH, hypothyroidism, prostate cancer s/p HIFU was seen  by primary on November 13, 2022 for acute onset of lower back pain.  Patient reports that he was sitting outside and had violent sneeze which was followed by severe back pain.  He has fallen allergies.  Had x-rays followed by MRI thoracic spine without contrast.  It showed compression fracture of T12 superior endplate with surrounding marrow edema likely subacute.  15 mm T1 hypointense marrow lesion in the anterior aspect of L1 suspicious for metastasis.  Further characterization with postcontrast MRI recommended.  Prostate cancer-follows with Dr. Evelene Croon, urology for elevated PSA.  MRI prostate showed 1 mm category 5 lesion of anterior transition zone.  Underwent fusion biopsy showed Gleason score 3+4 adenocarcinoma involving left  anterior 4/4 cores. s/p HIFU of the prostate on 02/07/2022.  PSA level from 10/08/2022 was 3.34.  Interval history Connected with the patient via video visit accompanied with wife as hospital discharge.  They were recently in the hospital from diarrhea, urinary retention and low-grade fever.  And also worsening back pain.  CT imaging showed heavy stool burden in mild bilateral hydroureteronephrosis.  His pain is better controlled with regularization of pain medication.  Was discharged on Foley catheter.  Also had meeting with hospice and was enrolled.  Today they are hesitant about continuing with the hospice now that he has been feeling better and had been responding to treatment.  They are requesting to revoke the hospice.  I have reviewed his chart and materials related to his cancer extensively and collaborated history with the patient. Summary of oncologic history is as follows: Oncology History  Multiple myeloma (HCC)  12/05/2022 Cancer Staging   Staging form: Plasma Cell Myeloma and Plasma Cell Disorders, AJCC 8th Edition - Clinical stage from 12/05/2022: RISS Stage II (Beta-2-microglobulin (mg/L): 3.7, Albumin (g/dL): 3.6, ISS: Stage II, High-risk cytogenetics: Absent, LDH: Normal) - Signed by [name], MD on 12/26/2022 Stage prefix: Initial diagnosis Beta 2 microglobulin range (mg/L): 3.5 to 5.49 Albumin range (g/dL): Greater than or equal to 3.5 Cytogenetics: 1q addition, Other mutation   12/13/2022 Initial Diagnosis   Multiple myeloma (HCC)   12/26/2022 - 01/24/2023 Chemotherapy   Patient is on Treatment Plan : MYELOMA NEWLY DIAGNOSED Daratumumab IV + Lenalidomide + Dexamethasone Weekly (DaraRd) q28d     02/07/2023 -  Chemotherapy   Patient is on Treatment Plan : MYELOMA NON-TRANSPLANT CANDIDATES VRd weekly q21d       MEDICAL HISTORY:  Past Medical History:  Diagnosis Date   Anxiety    a.) on BZO (alprazolam) PRN   Cervicalgia    Chest pain, non-cardiac    Elevated PSA     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   Distal with moderate free air surrounding Hiatal Hernia   Gallbladder polyp    Gastritis    GERD (gastroesophageal reflux disease)    History of hiatal hernia    HLD (hyperlipidemia)    HTN (hypertension)    Hypothyroidism    Meniere disease    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   Nausea vomiting and diarrhea 01/26/2023   Prostate cancer (HCC) 2023   Seasonal allergies    Skin cancer    a.) ears   Wears partial dentures    lower    SURGICAL HISTORY: Past Surgical History:  Procedure Laterality Date   APPENDECTOMY     CATARACT EXTRACTION W/PHACO Right 01/15/2016   Procedure: CATARACT EXTRACTION PHACO AND INTRAOCULAR LENS PLACEMENT (IOC);  Surgeon: Sherald Hess, MD;  [facility];  Service: Ophthalmology;  Laterality: Right;  RIGHT CALL CELL PHONE WITH TIME   CHOLECYSTECTOMY     COLONOSCOPY     COLONOSCOPY WITH ESOPHAGOGASTRODUODENOSCOPY (EGD)     ESOPHAGOGASTRODUODENOSCOPY  07/2021   ESOPHAGOGASTRODUODENOSCOPY (EGD) WITH PROPOFOL N/A 09/23/2017   Procedure: ESOPHAGOGASTRODUODENOSCOPY (EGD) WITH PROPOFOL;  Surgeon: Toledo, Boykin Nearing, MD;  Location: [facility];  Service: Gastroenterology;  Laterality: N/A;   HIGH INTENSITY FOCUSED ULTRASOUND (HIFU) OF THE PROSTATE N/A 02/07/2022   Procedure: HIGH INTENSITY FOCUSED ULTRASOUND (HIFU) OF THE PROSTATE;  Surgeon: Orson Ape, MD;  Location: [facility];  Service: Urology;  Laterality: N/A;   HYDROCELE EXCISION / REPAIR     IR KYPHO THORACIC WITH BONE BIOPSY  12/06/2022   IR RADIOLOGIST EVAL & MGMT  11/21/2022   LASIX RT EYE     PROSTATE BIOPSY N/A 01/03/2022   Procedure: PROSTATE BIOPSY  Addison Calvillo;  Surgeon: Orson Ape, MD;  Location: [facility];  Service: Urology;  Laterality: N/A;   ROTATOR CUFF REPAIR Left 2001    SOCIAL HISTORY: Social History   Socioeconomic History   Marital status: Married    Spouse name: Not on file   Number of children:  Not on file   Years of education: Not on file   Highest education level: Not on file  Occupational History   Not on file  Tobacco Use   Smoking status: Never   Smokeless tobacco: Never  Vaping Use   Vaping status: Never Used  Substance and Sexual Activity   Alcohol use: Yes    Alcohol/week: 7.0 standard drinks of alcohol    Types: 7 Glasses of wine per week    Comment: wine occ   Drug use: No   Sexual activity: Not on file  Other Topics Concern   Not on file  Social History Narrative   Not on file   Social Determinants of Health   Financial Resource Strain: Low Risk  (11/21/2022)   Overall Financial Resource Strain (CARDIA)    Difficulty of Paying Living Expenses: Not hard at all  Food Insecurity: No Food Insecurity (01/26/2023)   Hunger Vital Sign    Worried About Running Out of Food in the Last Year: Never true    Ran Out of Food in the Last Year: Never true  Transportation Needs: No Transportation Needs (01/26/2023)   PRAPARE - Administrator, Civil Service (Medical): No    Lack of Transportation (Non-Medical): No  Physical Activity: Not on file  Stress: Not on file  Social Connections: Not on file  Intimate Partner Violence: Not At Risk (01/26/2023)   Humiliation, Afraid, Rape, and Kick questionnaire    Fear of Current or Ex-Partner: No    Emotionally Abused: No    Physically Abused: No    Sexually Abused: No    FAMILY HISTORY: Family History  Problem Relation Age of Onset   Pneumonia Father     ALLERGIES:  is allergic to amoxicillin-pot clavulanate and meloxicam.  MEDICATIONS:  Current Outpatient Medications  Medication Sig Dispense Refill   acetaminophen (TYLENOL) 500 MG tablet Take 500 mg by mouth every 6 (six) hours as needed for mild pain, moderate pain, fever or headache.     acyclovir (ZOVIRAX) 400 MG tablet Take 400 mg by mouth 2 (two) times daily.     ALPRAZolam (XANAX) 0.25 MG tablet Take 1 tablet (0.25 mg total) by mouth at bedtime as  needed for anxiety. 30 tablet 0   aspirin EC 81 MG tablet Take 1 tablet (  81 mg total) by mouth daily. Swallow whole. 90 tablet 2   cetirizine (ZYRTEC) 10 MG tablet Take 10 mg by mouth as needed for allergies.     docusate sodium (COLACE) 100 MG capsule Take 2 capsules (200 mg total) by mouth 2 (two) times daily. (Patient taking differently: Take 200 mg by mouth daily as needed for moderate constipation or mild constipation.) 120 capsule 3   dronabinol (MARINOL) 2.5 MG capsule Take 1 capsule (2.5 mg total) by mouth 2 (two) times daily before a meal. (Patient taking differently: Take 2.5 mg by mouth daily.) 60 capsule 0   fluticasone (FLONASE) 50 MCG/ACT nasal spray Place 1 spray into both nostrils daily as needed for rhinitis or allergies.     gabapentin (NEURONTIN) 100 MG capsule Take 1 capsule (100 mg total) by mouth 3 (three) times daily. (Patient taking differently: Take 100 mg by mouth daily as needed.) 30 capsule 0   lactulose (CHRONULAC) 10 GM/15ML solution Take 10-20 GM (15-30 ml) one - two times a day as needed for constipation 475 mL 0   levothyroxine (SYNTHROID, LEVOTHROID) 50 MCG tablet Take 50 mcg by mouth daily before breakfast.     naloxone (NARCAN) nasal spray 4 mg/0.1 mL SPRAY 1 SPRAY INTO ONE NOSTRIL AS DIRECTED FOR OPIOID OVERDOSE (TURN PERSON ON SIDE AFTER DOSE. IF NO RESPONSE IN 2-3 MINUTES OR PERSON RESPONDS BUT RELAPSES, REPEAT USING A NEW SPRAY DEVICE AND SPRAY INTO THE OTHER NOSTRIL. CALL 911 AFTER USE.) * EMERGENCY USE ONLY * 1 each 0   oxyCODONE (OXY IR/ROXICODONE) 5 MG immediate release tablet Take 1-2 tablets (5-10 mg total) by mouth every 4 (four) hours as needed for severe pain. 120 tablet 0   pantoprazole (PROTONIX) 40 MG tablet Take 40 mg by mouth daily as needed.     Probiotic Product (PROBIOTIC ADVANCED PO) Take 1 tablet by mouth at bedtime.     senna (SENOKOT) 8.6 MG tablet Take 2 tablets (17.2 mg total) by mouth 2 (two) times daily. May crush, mix with water and give  sublingually if needed. 28 tablet 0   No current facility-administered medications for this visit.    REVIEW OF SYSTEMS:   Pertinent information mentioned in HPI All other systems were reviewed with the patient and are negative.  PHYSICAL EXAMINATION: ECOG PERFORMANCE STATUS: 1 - Symptomatic but completely ambulatory  There were no vitals filed for this visit.   There were no vitals filed for this visit.    GENERAL:alert, no distress and comfortable SKIN: skin color, texture, turgor are normal, no rashes or significant lesions EYES: normal, conjunctiva are pink and non-injected, sclera clear OROPHARYNX:no exudate, no erythema and lips, buccal mucosa, and tongue normal  NECK: supple, thyroid normal size, non-tender, without nodularity LYMPH:  no palpable lymphadenopathy in the cervical, axillary or inguinal LUNGS: clear to auscultation and percussion with normal breathing effort HEART: regular rate & rhythm and no murmurs and no lower extremity edema ABDOMEN:abdomen soft, non-tender and normal bowel sounds Musculoskeletal:no cyanosis of digits and no clubbing  PSYCH: alert & oriented x 3 with fluent speech NEURO: no focal motor/sensory deficits  LABORATORY DATA:  I have reviewed the data as listed Lab Results  Component Value Date   WBC 3.7 (L) 01/28/2023   HGB 10.3 (L) 01/28/2023   HCT 29.4 (L) 01/28/2023   MCV 100.0 01/28/2023   PLT 170 01/28/2023   Recent Labs    01/13/23 1350 01/16/23 0934 01/26/23 1221 01/26/23 2147 01/27/23 1610 01/28/23 9604  NA 129* 129* 123* 127* 129* 126*  K 3.8 4.0 3.9 4.2 3.8 3.8  CL 97* 97* 93* 97* 99 93*  CO2 25 24 20* 23 24 25   GLUCOSE 96 99 111* 108* 96 104*  BUN 21 12 17 14 12 11   CREATININE 0.81 0.93 0.87 0.94 0.90 0.73  CALCIUM 8.1* 8.5* 8.1* 8.6* 8.2* 8.2*  GFRNONAA >60 >60 >60 >60 >60 >60  PROT 6.3* 6.6 6.2*  --   --   --   ALBUMIN 3.2* 3.5 3.7  --   --   --   AST 16 18 23   --   --   --   ALT 16 17 28   --   --   --    ALKPHOS 113 124 127*  --   --   --   BILITOT 0.8 0.5 1.4*  --   --   --     RADIOGRAPHIC STUDIES: I have personally reviewed the radiological images as listed and agreed with the findings in the report. MR CERVICAL SPINE W WO CONTRAST  Result Date: 01/26/2023 CLINICAL DATA:  Metastatic disease evaluation EXAM: MRI CERVICAL, THORACIC AND LUMBAR SPINE WITHOUT AND WITH CONTRAST TECHNIQUE: Multiplanar and multiecho pulse sequences of the cervical spine, to include the craniocervical junction and cervicothoracic junction, and thoracic and lumbar spine, were obtained without and with intravenous contrast. CONTRAST:  6mL GADAVIST GADOBUTROL 1 MMOL/ML IV SOLN COMPARISON:  None Available. FINDINGS: MRI CERVICAL SPINE FINDINGS Alignment: Grade 1 C4-5 anterolisthesis and grade 1 C5-6 retrolisthesis Vertebrae: No fracture, evidence of discitis, or bone lesion. Cord: Normal signal and morphology. Posterior Fossa, vertebral arteries, paraspinal tissues: Negative. Disc levels: C1-2: Unremarkable. C2-3: Small disc bulge with right-greater-than-left uncovertebral hypertrophy. There is no spinal canal stenosis. Moderate right neural foraminal stenosis. C3-4: Small disc bulge with uncovertebral spurring and right-greater-than-left facet hypertrophy. There is no spinal canal stenosis. Severe right and mild left neural foraminal stenosis. C4-5: Severe right facet arthrosis. There is no spinal canal stenosis. Mild right neural foraminal stenosis. C5-6: Disc bulge with bilateral uncovertebral hypertrophy. Mild spinal canal stenosis. Severe bilateral neural foraminal stenosis. C6-7: Left asymmetric disc bulge with endplate spurring. Mild spinal canal stenosis. Severe left neural foraminal stenosis. C7-T1: Normal disc space and facet joints. There is no spinal canal stenosis. No neural foraminal stenosis. MRI THORACIC SPINE FINDINGS Alignment:  Physiologic. Vertebrae: There is superior endplate signal abnormalities at T8 and T9,  likely minimally depressed compression fractures. There is abnormal signal within the inferior half of T11. Chronic compression deformity of T12 with augmentation cement. There is contrast enhancement at T9, T8 and T11 but no visualized soft tissue component. Cord:  Normal signal and morphology. Paraspinal and other soft tissues: Negative. Disc levels: No spinal canal or neural foraminal stenosis. MRI LUMBAR SPINE FINDINGS Segmentation:  Standard. Alignment:  Physiologic. Vertebrae: Compression deformities at L1 and L3 with mild bone marrow edema. No soft tissue mass. Conus medullaris and cauda equina: Conus extends to the L1 level. Conus and cauda equina appear normal. Paraspinal and other soft tissues: Negative Disc levels: L1-L2: Normal disc space and facet joints. No spinal canal stenosis. No neural foraminal stenosis. L2-L3: Small disc bulge with mild facet hypertrophy. No spinal canal stenosis. No neural foraminal stenosis. L3-L4: Right asymmetric disc bulge. Mild spinal canal stenosis. Severe narrowing of the right lateral recess and mild left lateral recess narrowing. Mild right neural foraminal stenosis. L4-L5: Small disc bulge with endplate spurring. Right lateral recess narrowing without central spinal canal  stenosis. Mild right and moderate left neural foraminal stenosis. L5-S1: Normal disc space and facet joints. No spinal canal stenosis. No neural foraminal stenosis. Visualized sacrum: Normal. IMPRESSION: 1. Acute to subacute compression fractures of T8, T9, L1 and L3 with mild bone marrow edema and enhancement. There are no specific imaging features to indicate a neoplastic process; but, given history of myeloma, myelomatous involvement is probable. 2. 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 3. Mild C5-6 and C6-7 spinal canal stenosis. 4. Severe right lateral recess and mild left lateral recess narrowing at L3-4. 5. Moderate left L4-5 neural foraminal stenosis. Electronically  Signed   By: Deatra Robinson M.D.   On: 01/26/2023 20:05   MR Lumbar Spine W Wo Contrast  Result Date: 01/26/2023 CLINICAL DATA:  Metastatic disease evaluation EXAM: MRI CERVICAL, THORACIC AND LUMBAR SPINE WITHOUT AND WITH CONTRAST TECHNIQUE: Multiplanar and multiecho pulse sequences of the cervical spine, to include the craniocervical junction and cervicothoracic junction, and thoracic and lumbar spine, were obtained without and with intravenous contrast. CONTRAST:  6mL GADAVIST GADOBUTROL 1 MMOL/ML IV SOLN COMPARISON:  None Available. FINDINGS: MRI CERVICAL SPINE FINDINGS Alignment: Grade 1 C4-5 anterolisthesis and grade 1 C5-6 retrolisthesis Vertebrae: No fracture, evidence of discitis, or bone lesion. Cord: Normal signal and morphology. Posterior Fossa, vertebral arteries, paraspinal tissues: Negative. Disc levels: C1-2: Unremarkable. C2-3: Small disc bulge with right-greater-than-left uncovertebral hypertrophy. There is no spinal canal stenosis. Moderate right neural foraminal stenosis. C3-4: Small disc bulge with uncovertebral spurring and right-greater-than-left facet hypertrophy. There is no spinal canal stenosis. Severe right and mild left neural foraminal stenosis. C4-5: Severe right facet arthrosis. There is no spinal canal stenosis. Mild right neural foraminal stenosis. C5-6: Disc bulge with bilateral uncovertebral hypertrophy. Mild spinal canal stenosis. Severe bilateral neural foraminal stenosis. C6-7: Left asymmetric disc bulge with endplate spurring. Mild spinal canal stenosis. Severe left neural foraminal stenosis. C7-T1: Normal disc space and facet joints. There is no spinal canal stenosis. No neural foraminal stenosis. MRI THORACIC SPINE FINDINGS Alignment:  Physiologic. Vertebrae: There is superior endplate signal abnormalities at T8 and T9, likely minimally depressed compression fractures. There is abnormal signal within the inferior half of T11. Chronic compression deformity of T12 with  augmentation cement. There is contrast enhancement at T9, T8 and T11 but no visualized soft tissue component. Cord:  Normal signal and morphology. Paraspinal and other soft tissues: Negative. Disc levels: No spinal canal or neural foraminal stenosis. MRI LUMBAR SPINE FINDINGS Segmentation:  Standard. Alignment:  Physiologic. Vertebrae: Compression deformities at L1 and L3 with mild bone marrow edema. No soft tissue mass. Conus medullaris and cauda equina: Conus extends to the L1 level. Conus and cauda equina appear normal. Paraspinal and other soft tissues: Negative Disc levels: L1-L2: Normal disc space and facet joints. No spinal canal stenosis. No neural foraminal stenosis. L2-L3: Small disc bulge with mild facet hypertrophy. No spinal canal stenosis. No neural foraminal stenosis. L3-L4: Right asymmetric disc bulge. Mild spinal canal stenosis. Severe narrowing of the right lateral recess and mild left lateral recess narrowing. Mild right neural foraminal stenosis. L4-L5: Small disc bulge with endplate spurring. Right lateral recess narrowing without central spinal canal stenosis. Mild right and moderate left neural foraminal stenosis. L5-S1: Normal disc space and facet joints. No spinal canal stenosis. No neural foraminal stenosis. Visualized sacrum: Normal. IMPRESSION: 1. Acute to subacute compression fractures of T8, T9, L1 and L3 with mild bone marrow edema and enhancement. There are no specific imaging features to indicate a neoplastic process; but,  given history of myeloma, myelomatous involvement is probable. 2. 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 3. Mild C5-6 and C6-7 spinal canal stenosis. 4. Severe right lateral recess and mild left lateral recess narrowing at L3-4. 5. Moderate left L4-5 neural foraminal stenosis. Electronically Signed   By: Deatra Robinson M.D.   On: 01/26/2023 20:05   MR THORACIC SPINE W WO CONTRAST  Result Date: 01/26/2023 CLINICAL DATA:  Metastatic  disease evaluation EXAM: MRI CERVICAL, THORACIC AND LUMBAR SPINE WITHOUT AND WITH CONTRAST TECHNIQUE: Multiplanar and multiecho pulse sequences of the cervical spine, to include the craniocervical junction and cervicothoracic junction, and thoracic and lumbar spine, were obtained without and with intravenous contrast. CONTRAST:  6mL GADAVIST GADOBUTROL 1 MMOL/ML IV SOLN COMPARISON:  None Available. FINDINGS: MRI CERVICAL SPINE FINDINGS Alignment: Grade 1 C4-5 anterolisthesis and grade 1 C5-6 retrolisthesis Vertebrae: No fracture, evidence of discitis, or bone lesion. Cord: Normal signal and morphology. Posterior Fossa, vertebral arteries, paraspinal tissues: Negative. Disc levels: C1-2: Unremarkable. C2-3: Small disc bulge with right-greater-than-left uncovertebral hypertrophy. There is no spinal canal stenosis. Moderate right neural foraminal stenosis. C3-4: Small disc bulge with uncovertebral spurring and right-greater-than-left facet hypertrophy. There is no spinal canal stenosis. Severe right and mild left neural foraminal stenosis. C4-5: Severe right facet arthrosis. There is no spinal canal stenosis. Mild right neural foraminal stenosis. C5-6: Disc bulge with bilateral uncovertebral hypertrophy. Mild spinal canal stenosis. Severe bilateral neural foraminal stenosis. C6-7: Left asymmetric disc bulge with endplate spurring. Mild spinal canal stenosis. Severe left neural foraminal stenosis. C7-T1: Normal disc space and facet joints. There is no spinal canal stenosis. No neural foraminal stenosis. MRI THORACIC SPINE FINDINGS Alignment:  Physiologic. Vertebrae: There is superior endplate signal abnormalities at T8 and T9, likely minimally depressed compression fractures. There is abnormal signal within the inferior half of T11. Chronic compression deformity of T12 with augmentation cement. There is contrast enhancement at T9, T8 and T11 but no visualized soft tissue component. Cord:  Normal signal and morphology.  Paraspinal and other soft tissues: Negative. Disc levels: No spinal canal or neural foraminal stenosis. MRI LUMBAR SPINE FINDINGS Segmentation:  Standard. Alignment:  Physiologic. Vertebrae: Compression deformities at L1 and L3 with mild bone marrow edema. No soft tissue mass. Conus medullaris and cauda equina: Conus extends to the L1 level. Conus and cauda equina appear normal. Paraspinal and other soft tissues: Negative Disc levels: L1-L2: Normal disc space and facet joints. No spinal canal stenosis. No neural foraminal stenosis. L2-L3: Small disc bulge with mild facet hypertrophy. No spinal canal stenosis. No neural foraminal stenosis. L3-L4: Right asymmetric disc bulge. Mild spinal canal stenosis. Severe narrowing of the right lateral recess and mild left lateral recess narrowing. Mild right neural foraminal stenosis. L4-L5: Small disc bulge with endplate spurring. Right lateral recess narrowing without central spinal canal stenosis. Mild right and moderate left neural foraminal stenosis. L5-S1: Normal disc space and facet joints. No spinal canal stenosis. No neural foraminal stenosis. Visualized sacrum: Normal. IMPRESSION: 1. Acute to subacute compression fractures of T8, T9, L1 and L3 with mild bone marrow edema and enhancement. There are no specific imaging features to indicate a neoplastic process; but, given history of myeloma, myelomatous involvement is probable. 2. 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 3. Mild C5-6 and C6-7 spinal canal stenosis. 4. Severe right lateral recess and mild left lateral recess narrowing at L3-4. 5. Moderate left L4-5 neural foraminal stenosis. Electronically Signed   By: Deatra Robinson M.D.   On: 01/26/2023 20:05  CT ABDOMEN PELVIS W CONTRAST  Result Date: 01/26/2023 CLINICAL DATA:  Abdominal pain.  Weakness. EXAM: CT ABDOMEN AND PELVIS WITH CONTRAST TECHNIQUE: Multidetector CT imaging of the abdomen and pelvis was performed using the standard  protocol following bolus administration of intravenous contrast. RADIATION DOSE REDUCTION: This exam was performed according to the departmental dose-optimization program which includes automated exposure control, adjustment of the mA and/or kV according to patient size and/or use of iterative reconstruction technique. CONTRAST:  80mL OMNIPAQUE IOHEXOL 300 MG/ML  SOLN COMPARISON:  08/03/2021. FINDINGS: Lower chest: No acute findings. 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 Hepatobiliary: Liver normal in size. 2 cysts in segment 2, smaller than on the prior CT. Largest measures 2 cm. No other liver masses or lesions. Status post cholecystectomy. No bile duct dilation. Pancreas: Unremarkable. No pancreatic ductal dilatation or surrounding inflammatory changes. Spleen: Normal in size without focal abnormality. Adrenals/Urinary Tract: Normal adrenal glands. Kidneys normal in size, orientation and position with symmetric enhancement and excretion. 1.8 cm posterior midpole right renal cyst, stable with no follow-up indicated. No other renal masses. Mild bilateral hydronephrosis and hydroureter. No renal or ureteral stones. Bladder is distended. No bladder wall thickening, mass or stone. Stomach/Bowel: Small hiatal hernia.  Stomach otherwise unremarkable. Small bowel and colon are normal in caliber. No wall thickening or inflammation. Mild to moderate increase in the colonic stool burden. Rectum moderately distended with stool. Vascular/Lymphatic: Aortic atherosclerosis. No aneurysm. No enlarged lymph nodes. Reproductive: Unremarkable. Other: No ascites.  No hernia. Musculoskeletal: There are compression fractures of T12, L1 and L3, T12 previously treated with vertebroplasty. These fractures are new since the prior CT. Numerous small lucencies are seen throughout the visualized skeleton. Findings consistent with multiple myeloma. PET-CT performed on 12/25/2022 has an indication  of initial treatment strategy for multiple myeloma. IMPRESSION: 1. There are vertebral fractures as detailed all new since the CT from 08/03/2021. Only the T12 fracture was evident on an MRI from 11/28/2022. The L1 and L3 fractures may be recent. There are underlying numerous small lucencies throughout the visualized skeleton consistent with multiple myeloma. 2. Significant bladder distension, which is the presumed cause of mild bilateral hydroureteronephrosis. 3. No other acute abnormality within the abdomen or pelvis. 4. Increased colonic stool burden and rectum moderately distended with stool. 5. Aortic atherosclerosis. Electronically Signed   By: Amie Portland M.D.   On: 01/26/2023 16:41

## 2023-02-03 ENCOUNTER — Telehealth: Payer: Self-pay

## 2023-02-03 ENCOUNTER — Encounter: Payer: Self-pay | Admitting: Hospice and Palliative Medicine

## 2023-02-03 ENCOUNTER — Encounter: Payer: Self-pay | Admitting: Internal Medicine

## 2023-02-03 NOTE — Telephone Encounter (Signed)
Called patient regarding mychart message informed he patient is scheduled for urologist on 8/1. Requests Foley be out sooner. Only other option to call urologist office and request to be added to cancellation list for sooner appointment. Patient states she has already called office and wait for call back

## 2023-02-04 ENCOUNTER — Inpatient Hospital Stay: Payer: Medicare HMO | Admitting: Licensed Clinical Social Worker

## 2023-02-04 NOTE — Progress Notes (Signed)
[facility] Clinical Social Work  Clinical Social Work was referred by medical provider for assessment of psychosocial needs.  Clinical Social Worker contacted caregiver by phone to offer support and assess for needs.    CSW briefly spoke with patient's wife, Melvin Gilmore.  She stated they are wishing to resume treatment.  CSW to meet with patient during infusion at 10:30a on Friday, 02/07/23.     Dorothey Baseman, LCSW  Clinical Social Worker [facility]

## 2023-02-05 ENCOUNTER — Ambulatory Visit: Payer: Self-pay | Admitting: Urology

## 2023-02-05 ENCOUNTER — Ambulatory Visit: Payer: Medicare HMO | Admitting: Occupational Therapy

## 2023-02-06 ENCOUNTER — Telehealth: Payer: Medicare HMO | Admitting: Hospice and Palliative Medicine

## 2023-02-06 ENCOUNTER — Ambulatory Visit: Payer: Self-pay | Admitting: Physician Assistant

## 2023-02-07 ENCOUNTER — Inpatient Hospital Stay: Payer: Medicare HMO

## 2023-02-07 ENCOUNTER — Ambulatory Visit: Payer: Medicare HMO

## 2023-02-07 ENCOUNTER — Inpatient Hospital Stay: Payer: Medicare HMO | Admitting: Internal Medicine

## 2023-02-07 ENCOUNTER — Other Ambulatory Visit: Payer: Medicare HMO

## 2023-02-07 ENCOUNTER — Inpatient Hospital Stay (HOSPITAL_BASED_OUTPATIENT_CLINIC_OR_DEPARTMENT_OTHER): Payer: Medicare HMO | Admitting: Internal Medicine

## 2023-02-07 ENCOUNTER — Ambulatory Visit: Payer: Medicare HMO | Admitting: Internal Medicine

## 2023-02-07 VITALS — BP 110/64 | HR 87 | Temp 99.2°F | Wt 138.0 lb

## 2023-02-07 DIAGNOSIS — C9000 Multiple myeloma not having achieved remission: Secondary | ICD-10-CM

## 2023-02-07 DIAGNOSIS — Z5112 Encounter for antineoplastic immunotherapy: Secondary | ICD-10-CM | POA: Diagnosis not present

## 2023-02-07 DIAGNOSIS — S32000K Wedge compression fracture of unspecified lumbar vertebra, subsequent encounter for fracture with nonunion: Secondary | ICD-10-CM

## 2023-02-07 DIAGNOSIS — G893 Neoplasm related pain (acute) (chronic): Secondary | ICD-10-CM | POA: Diagnosis not present

## 2023-02-07 DIAGNOSIS — R630 Anorexia: Secondary | ICD-10-CM | POA: Diagnosis not present

## 2023-02-07 LAB — CBC WITH DIFFERENTIAL (CANCER CENTER ONLY)
Abs Immature Granulocytes: 0.02 10*3/uL (ref 0.00–0.07)
Basophils Absolute: 0 10*3/uL (ref 0.0–0.1)
Basophils Relative: 0 %
Eosinophils Absolute: 0 10*3/uL (ref 0.0–0.5)
Eosinophils Relative: 1 %
HCT: 34.5 % — ABNORMAL LOW (ref 39.0–52.0)
Hemoglobin: 12.2 g/dL — ABNORMAL LOW (ref 13.0–17.0)
Immature Granulocytes: 1 %
Lymphocytes Relative: 33 %
Lymphs Abs: 1.4 10*3/uL (ref 0.7–4.0)
MCH: 34.5 pg — ABNORMAL HIGH (ref 26.0–34.0)
MCHC: 35.4 g/dL (ref 30.0–36.0)
MCV: 97.5 fL (ref 80.0–100.0)
Monocytes Absolute: 0.3 10*3/uL (ref 0.1–1.0)
Monocytes Relative: 6 %
Neutro Abs: 2.5 10*3/uL (ref 1.7–7.7)
Neutrophils Relative %: 59 %
Platelet Count: 109 10*3/uL — ABNORMAL LOW (ref 150–400)
RBC: 3.54 MIL/uL — ABNORMAL LOW (ref 4.22–5.81)
RDW: 16.2 % — ABNORMAL HIGH (ref 11.5–15.5)
WBC Count: 4.2 10*3/uL (ref 4.0–10.5)
nRBC: 0 % (ref 0.0–0.2)

## 2023-02-07 LAB — CMP (CANCER CENTER ONLY)
ALT: 31 U/L (ref 0–44)
AST: 32 U/L (ref 15–41)
Albumin: 3.5 g/dL (ref 3.5–5.0)
Alkaline Phosphatase: 103 U/L (ref 38–126)
Anion gap: 9 (ref 5–15)
BUN: 14 mg/dL (ref 8–23)
CO2: 23 mmol/L (ref 22–32)
Calcium: 8.4 mg/dL — ABNORMAL LOW (ref 8.9–10.3)
Chloride: 93 mmol/L — ABNORMAL LOW (ref 98–111)
Creatinine: 0.85 mg/dL (ref 0.61–1.24)
GFR, Estimated: >60 mL/min (ref >60)
Glucose, Bld: 130 mg/dL — ABNORMAL HIGH (ref 70–99)
Potassium: 3.7 mmol/L (ref 3.5–5.1)
Sodium: 125 mmol/L — ABNORMAL LOW (ref 135–145)
Total Bilirubin: 1 mg/dL (ref 0.3–1.2)
Total Protein: 6.3 g/dL — ABNORMAL LOW (ref 6.5–8.1)

## 2023-02-07 MED ORDER — BORTEZOMIB CHEMO SQ INJECTION 3.5 MG (2.5MG/ML)
1.3000 mg/m2 | Freq: Once | INTRAMUSCULAR | Status: AC
  Administered 2023-02-07: 2.25 mg via SUBCUTANEOUS
  Filled 2023-02-07: qty 0.9

## 2023-02-07 NOTE — Progress Notes (Signed)
Patient says that his appetite is getting a little better, which the family agree that the Marinol medication is working. The wife is wanting to know if he can continue taking the Marinol for a while longer and maybe up the dose just a tad? He told me that his abdomen pain that was radiating towards his middle lower back has seized a bit. His constipation has got better. He is ready to have the catheter taken out asap.

## 2023-02-07 NOTE — Patient Instructions (Signed)
[facility]  Discharge Instructions: Thank you for choosing [facility] to provide your oncology and hematology care.  If you have a lab appointment with the Cancer Center, please go directly to the Cancer Center and check in at the registration area.  Wear comfortable clothing and clothing appropriate for easy access to any Portacath or PICC line.   We strive to give you quality time with your provider. You may need to reschedule your appointment if you arrive late (15 or more minutes).  Arriving late affects you and other patients whose appointments are after yours.  Also, if you miss three or more appointments without notifying the office, you may be dismissed from the clinic at the provider's discretion.      For prescription refill requests, have your pharmacy contact our office and allow 72 hours for refills to be completed.    Today you received the following chemotherapy and/or immunotherapy agents Velcade.      To help prevent nausea and vomiting after your treatment, we encourage you to take your nausea medication as directed.  BELOW ARE SYMPTOMS THAT SHOULD BE REPORTED IMMEDIATELY: *FEVER GREATER THAN 100.4 F (38 C) OR HIGHER *CHILLS OR SWEATING *NAUSEA AND VOMITING THAT IS NOT CONTROLLED WITH YOUR NAUSEA MEDICATION *UNUSUAL SHORTNESS OF BREATH *UNUSUAL BRUISING OR BLEEDING *URINARY PROBLEMS (pain or burning when urinating, or frequent urination) *BOWEL PROBLEMS (unusual diarrhea, constipation, pain near the anus) TENDERNESS IN MOUTH AND THROAT WITH OR WITHOUT PRESENCE OF ULCERS (sore throat, sores in mouth, or a toothache) UNUSUAL RASH, SWELLING OR PAIN  UNUSUAL VAGINAL DISCHARGE OR ITCHING   Items with * indicate a potential emergency and should be followed up as soon as possible or go to the Emergency Department if any problems should occur.  Please show the CHEMOTHERAPY ALERT CARD or IMMUNOTHERAPY ALERT CARD at check-in to  the Emergency Department and triage nurse.  Should you have questions after your visit or need to cancel or reschedule your appointment, please contact  [facility]  [phone] and follow the prompts.  Office hours are 8:00 a.m. to 4:30 p.m. Monday - Friday. Please note that voicemails left after 4:00 p.m. may not be returned until the following business day.  We are closed weekends and major holidays. You have access to a nurse at all times for urgent questions. Please call the main number to the clinic [phone] and follow the prompts.  For any non-urgent questions, you may also contact your provider using MyChart. We now offer e-Visits for anyone 18 and older to request care online for non-urgent symptoms. For details visit [email or web address].   Also download the MyChart app! Go to the app store, search "MyChart", open the app, select , and log in with your MyChart username and password.    

## 2023-02-10 ENCOUNTER — Encounter: Payer: Self-pay | Admitting: Internal Medicine

## 2023-02-11 ENCOUNTER — Ambulatory Visit: Payer: Self-pay | Admitting: Physician Assistant

## 2023-02-11 ENCOUNTER — Encounter: Payer: Self-pay | Admitting: Internal Medicine

## 2023-02-11 NOTE — Progress Notes (Signed)
[facility] CONSULT NOTE  Patient Care Team: [name], MD as PCP - General (Internal Medicine)  CANCER STAGING   Cancer Staging  Multiple myeloma [facility]) Staging form: Plasma Cell Myeloma and Plasma Cell Disorders, AJCC 8th Edition - Clinical stage from 12/05/2022: RISS Stage II (Beta-2-microglobulin (mg/L): 3.7, Albumin (g/dL): 3.6, ISS: Stage II, High-risk cytogenetics: Absent, LDH: Normal) - Signed by [name], MD on 12/26/2022 Stage prefix: Initial diagnosis Beta 2 microglobulin range (mg/L): 3.5 to 5.49 Albumin range (g/dL): Greater than or equal to 3.5 Cytogenetics: 1q addition, Other mutation   ASSESSMENT & PLAN:  Melvin Gilmore 82 y.o. male with pmh of hypertension, hyperlipidemia, anxiety, GERD, BPH, hypothyroidism, prostate cancer s/p HIFU was seen by primary on November 13, 2022 for acute onset of lower back pain.  Was referred to medical oncology for finding of L1 lesion suspicious for metastasis.  # Multiple myeloma, RISS Stage II -MRI thoracic and lumbar spine with and without contrast was reviewed. Showed unchanged subacute compression fracture at T12.  Focal enhancing marrow replacing lesions in T6, T8, T5, T2 all suspicious for metastatic disease.  L1 lesion measuring 16 mm.  Multilevel degenerative changes present.  -SPEP/IFE showed biclonal IgA kappa paraprotein with M spike 2.1 g/dL and second spike at 0.4 g/dL.  Kappa 453, lambda 6.8 with a ratio of 66.7.  Iron panel and B12 are normal.  PSA 3.44.  CBC showed mild anemia 13.4, chronic.  On CMP normal creatinine and calcium levels.  Elevated total protein 8.8.  LDH normal.  Beta-2 microglobulin 3.7.  Albumin 3.6.  24-hour urine/UPEP showed 1.1 g protein with no M spike.  -BMBx Hypercellular marrow with 60% plasma cells.  IHC positive for CD138/ mum 1.  Kappa restricted consistent with multiple myeloma. L1 vertebral body biopsy showed numerous plasma cells. Cytogenetics - del (13q) and gain 1.     -PET/CT showed activity in L1 and left eighth rib concerning for myeloma.  -Started on Dara-RD on 12/26/2022.  Completed weekly x 4 doses daratumumab -> then hospitalized for urinary retention, constipation and back pain -> inpatient hospitalist team discussed with patient and transition him to hospice -> patient revoked hospice on 01/31/2023 since he was feeling better.  -Patient having intractable hiccups for 12 to 24 hours posttreatment which is making him very anxious.  Likely differentials include Decadron or daratumumab.  Patient wants to change the treatment plan and also stop Decadron.  Proceed with Velcade today.  Hold Decadron today. Now again after re-discussion patient is agreeable to reconsider Daratumumab but continue to hold decadron. [name] will be better tolerated in terms of neuropathy. Will switch next week.   -Revlimid 10 mg could not tolerate due to neutropenia. Dose reduced to 5 mg 3 weeks on 1 week off.   -Showing response to treatment.  Kappa chains decreasing and M spike decreased from 2.1 to 1.2 g/dL.  # Acute urinary retention likely secondary to stool burden - Foley catheter in place. Scheduled to see Dr. Lonna Cobb on August 1.  # Intractable hiccups -Potential etiology include Decadron or daratumumab. -As above  # Vertebral compression fractures - Advised to obtain dental clearance prior to starting Zometa  # T12 pathological fracture # Cancer-related pain - s/p kyphoplasty by Dr. Elijio Miles on 12/06/22.  No improvement in pain -Completed palliative RT on 01/20/2023.  Denies any improvement yet. -Patient started on MS Contin 15 mg twice daily.  But only taking at night and mentions pain is better controlled.  #  Opioid-induced constipation -Manageable with senna, MiraLAX, lactulose, Movantik as needed.  # History of prostate cancer - follows with Dr. Evelene Croon, urology for elevated PSA.  MRI prostate showed 1 mm category 5 lesion of anterior transition zone.  Underwent  fusion biopsy showed Gleason score 3+4 adenocarcinoma involving left anterior 4/4 cores. s/p HIFU of the prostate on 02/07/2022.  PSA level from 10/08/2022 was 3.34.  -PSA from 11/21/2022 3.44.  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  # Poor appetite # Weight loss -Could be from cancer and uncontrolled pain.  Pain management as above. Nutrition following. -He is taking Marinol 2.5 mg once daily.  Could not tolerate twice daily dose because it was making him feel hyper at night and inability to sleep.    # Anxiety -On longstanding Xanax 0.25 mg at night as needed.  Requesting for refill. -Anxiety has been heightened with the recent diagnosis, pain, poor appetite.  Will send for refill.  Orders Placed This Encounter  Procedures   CBC with Differential/Platelet    Standing Status:   Future    Standing Expiration Date:   02/07/2024   CBC with Differential (Cancer Center Only)    Standing Status:   Future    Standing Expiration Date:   02/13/2024   RTC in 1 week for MD visit, labs, Dara  The total time spent in the appointment was 30 minutes encounter with patients including review of chart and various tests results, discussions about plan of care and coordination of care plan   All questions were answered. The patient knows to call the clinic with any problems, questions or concerns. No barriers to learning was detected.  [name], MD 7/23/20241:23 PM   HISTORY OF PRESENTING ILLNESS:  Melvin Gilmore 82 y.o. male with past medical history of hypertension, hyperlipidemia, anxiety, GERD, BPH, hypothyroidism, prostate cancer s/p HIFU was seen by primary on November 13, 2022 for acute onset of lower back pain.  Patient reports that he was sitting outside and had violent sneeze which was followed by severe back pain.  He has fallen allergies.  Had x-rays followed by MRI thoracic spine without contrast.  It showed compression fracture of T12  superior endplate with surrounding marrow edema likely subacute.  15 mm T1 hypointense marrow lesion in the anterior aspect of L1 suspicious for metastasis.  Further characterization with postcontrast MRI recommended.  Prostate cancer-follows with Dr. Evelene Croon, urology for elevated PSA.  MRI prostate showed 1 mm category 5 lesion of anterior transition zone.  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 s/p HIFU of the prostate on 02/07/2022.  PSA level from 10/08/2022 was 3.34.  Interval history Patient was seen today accompanied by son and wife. Pain is better controlled with adding morphine.  He is taking it once a day in the evening.  But continues to feel tired.  Appetite is improving.  Has Foley in place and is scheduled to see urology August 1.  I have reviewed his chart and materials related to his cancer extensively and collaborated history with the patient. Summary of oncologic history is as follows: Oncology History  Multiple myeloma (HCC)  12/05/2022 Cancer Staging   Staging form: Plasma Cell Myeloma and Plasma Cell Disorders, AJCC 8th Edition - Clinical stage from 12/05/2022: RISS Stage II (Beta-2-microglobulin (mg/L): 3.7, Albumin (g/dL): 3.6, ISS: Stage II, High-risk cytogenetics: Absent, LDH: Normal) - Signed by [name], MD on 12/26/2022 Stage prefix: Initial  diagnosis Beta 2 microglobulin range (mg/L): 3.5 to 5.49 Albumin range (g/dL): Greater than or equal to 3.5 Cytogenetics: 1q addition, Other mutation   12/13/2022 Initial Diagnosis   Multiple myeloma (HCC)   12/26/2022 - 01/24/2023 Chemotherapy   Patient is on Treatment Plan : MYELOMA NEWLY DIAGNOSED Daratumumab IV + Lenalidomide + Dexamethasone Weekly (DaraRd) q28d     02/07/2023 - 02/07/2023 Chemotherapy   Patient is on Treatment Plan : MYELOMA NON-TRANSPLANT CANDIDATES VRd weekly q21d     02/13/2023 -  Chemotherapy   Patient is on Treatment Plan : MYELOMA NEWLY  DIAGNOSED Daratumumab IV + Lenalidomide + Dexamethasone Weekly (DaraRd) q28d       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   Nausea vomiting and diarrhea 01/26/2023   Prostate cancer (HCC) 2023   Seasonal allergies    Skin cancer    a.) ears   Wears partial dentures    lower    SURGICAL HISTORY: Past Surgical History:  Procedure Laterality Date   APPENDECTOMY     CATARACT EXTRACTION W/PHACO Right 01/15/2016   Procedure: CATARACT EXTRACTION PHACO AND INTRAOCULAR LENS PLACEMENT (IOC);  Surgeon: Sherald Hess, MD;  Location: [facility];  Service: Ophthalmology;  Laterality: Right;  RIGHT CALL CELL PHONE WITH TIME   CHOLECYSTECTOMY     COLONOSCOPY     COLONOSCOPY WITH ESOPHAGOGASTRODUODENOSCOPY (EGD)     ESOPHAGOGASTRODUODENOSCOPY  07/2021   ESOPHAGOGASTRODUODENOSCOPY (EGD) WITH PROPOFOL N/A 09/23/2017   Procedure: ESOPHAGOGASTRODUODENOSCOPY (EGD) WITH PROPOFOL;  Surgeon: Toledo, Boykin Nearing, MD;  Location: [facility];  Service: Gastroenterology;  Laterality: N/A;   HIGH INTENSITY FOCUSED ULTRASOUND (HIFU) OF THE PROSTATE N/A 02/07/2022   Procedure: HIGH INTENSITY FOCUSED ULTRASOUND (HIFU) OF THE PROSTATE;  Surgeon: Orson Ape, MD;  Location: [facility];  Service: Urology;  Laterality: N/A;   HYDROCELE EXCISION / REPAIR     IR KYPHO THORACIC WITH BONE BIOPSY  12/06/2022   IR RADIOLOGIST EVAL & MGMT  11/21/2022   LASIX RT EYE     PROSTATE BIOPSY N/A 01/03/2022   Procedure: PROSTATE BIOPSY  Addison Knaus;  Surgeon:  Orson Ape, MD;  Location: [facility];  Service: Urology;  Laterality: N/A;   ROTATOR CUFF REPAIR Left 2001    SOCIAL HISTORY: Social History   Socioeconomic History   Marital status: Married    Spouse name: Not on file   Number of children: Not on file   Years of education: Not on file   Highest education level: Not on file  Occupational History   Not on file  Tobacco Use   Smoking status: Never   Smokeless tobacco: Never  Vaping Use   Vaping status: Never Used  Substance and Sexual Activity   Alcohol use: Yes    Alcohol/week: 7.0 standard drinks of alcohol    Types: 7 Glasses of wine per week    Comment: wine occ   Drug use: No   Sexual activity:  Not on file  Other Topics Concern   Not on file  Social History Narrative   Not on file   Social Determinants of Health   Financial Resource Strain: Low Risk  (11/21/2022)   Overall Financial Resource Strain (CARDIA)    Difficulty of Paying Living Expenses: Not hard at all  Food Insecurity: No Food Insecurity (01/26/2023)   Hunger Vital Sign    Worried About Running Out of Food in the Last Year: Never true    Ran Out of Food in the Last Year: Never true  Transportation Needs: No Transportation Needs (01/26/2023)   PRAPARE - Administrator, Civil Service (Medical): No    Lack of Transportation (Non-Medical): No  Physical Activity: Not on file  Stress: Not on file  Social Connections: Not on file  Intimate Partner Violence: Not At Risk (01/26/2023)   Humiliation, Afraid, Rape, and Kick questionnaire    Fear of Current or Ex-Partner: No    Emotionally Abused: No    Physically Abused: No    Sexually Abused: No    FAMILY HISTORY: Family History  Problem Relation Age of Onset   Pneumonia Father     ALLERGIES:  is allergic to amoxicillin-pot clavulanate and meloxicam.  MEDICATIONS:  Current Outpatient Medications  Medication Sig Dispense Refill   acetaminophen (TYLENOL) 500 MG tablet Take 500 mg by mouth  every 6 (six) hours as needed for mild pain, moderate pain, fever or headache.     acyclovir (ZOVIRAX) 400 MG tablet Take 400 mg by mouth 2 (two) times daily.     ALPRAZolam (XANAX) 0.25 MG tablet Take 1 tablet (0.25 mg total) by mouth at bedtime as needed for anxiety. 30 tablet 0   aspirin EC 81 MG tablet Take 1 tablet (81 mg total) by mouth daily. Swallow whole. 90 tablet 2   cetirizine (ZYRTEC) 10 MG tablet Take 10 mg by mouth as needed for allergies.     docusate sodium (COLACE) 100 MG capsule Take 2 capsules (200 mg total) by mouth 2 (two) times daily. (Patient taking differently: Take 200 mg by mouth daily as needed for moderate constipation or mild constipation.) 120 capsule 3   dronabinol (MARINOL) 2.5 MG capsule Take 1 capsule (2.5 mg total) by mouth 2 (two) times daily before a meal. (Patient taking differently: Take 2.5 mg by mouth daily.) 60 capsule 0   fluticasone (FLONASE) 50 MCG/ACT nasal spray Place 1 spray into both nostrils daily as needed for rhinitis or allergies.     gabapentin (NEURONTIN) 100 MG capsule Take 1 capsule (100 mg total) by mouth 3 (three) times daily. (Patient taking differently: Take 100 mg by mouth daily as needed.) 30 capsule 0   lactulose (CHRONULAC) 10 GM/15ML solution Take 10-20 GM (15-30 ml) one - two times a day as needed for constipation 475 mL 0   levothyroxine (SYNTHROID, LEVOTHROID) 50 MCG tablet Take 50 mcg by mouth daily before breakfast.     naloxone (NARCAN) nasal spray 4 mg/0.1 mL SPRAY 1 SPRAY INTO ONE NOSTRIL AS DIRECTED FOR OPIOID OVERDOSE (TURN PERSON ON SIDE AFTER DOSE. IF NO RESPONSE IN 2-3 MINUTES OR PERSON RESPONDS BUT RELAPSES, REPEAT USING A NEW SPRAY DEVICE AND SPRAY INTO THE OTHER NOSTRIL. CALL 911 AFTER USE.) * EMERGENCY USE ONLY * 1 each 0   oxyCODONE (OXY IR/ROXICODONE) 5 MG immediate release tablet Take 1-2 tablets (5-10 mg total) by mouth every 4 (four) hours as needed for severe pain. 120 tablet 0  pantoprazole (PROTONIX) 40 MG  tablet Take 40 mg by mouth daily as needed.     Probiotic Product (PROBIOTIC ADVANCED PO) Take 1 tablet by mouth at bedtime.     senna (SENOKOT) 8.6 MG tablet Take 2 tablets (17.2 mg total) by mouth 2 (two) times daily. May crush, mix with water and give sublingually if needed. 28 tablet 0   No current facility-administered medications for this visit.    REVIEW OF SYSTEMS:   Pertinent information mentioned in HPI All other systems were reviewed with the patient and are negative.  PHYSICAL EXAMINATION: ECOG PERFORMANCE STATUS: 1 - Symptomatic but completely ambulatory  Vitals:   02/07/23 1220  BP: 110/64  Pulse: 87  Temp: 99.2 F (37.3 C)  SpO2: 99%     Filed Weights   02/07/23 1220  Weight: 138 lb (62.6 kg)      GENERAL:alert, no distress and comfortable SKIN: skin color, texture, turgor are normal, no rashes or significant lesions EYES: normal, conjunctiva are pink and non-injected, sclera clear OROPHARYNX:no exudate, no erythema and lips, buccal mucosa, and tongue normal  NECK: supple, thyroid normal size, non-tender, without nodularity LYMPH:  no palpable lymphadenopathy in the cervical, axillary or inguinal LUNGS: clear to auscultation and percussion with normal breathing effort HEART: regular rate & rhythm and no murmurs and no lower extremity edema ABDOMEN:abdomen soft, non-tender and normal bowel sounds Musculoskeletal:no cyanosis of digits and no clubbing  PSYCH: alert & oriented x 3 with fluent speech NEURO: no focal motor/sensory deficits  LABORATORY DATA:  I have reviewed the data as listed Lab Results  Component Value Date   WBC 4.2 02/07/2023   HGB 12.2 (L) 02/07/2023   HCT 34.5 (L) 02/07/2023   MCV 97.5 02/07/2023   PLT 109 (L) 02/07/2023   Recent Labs    01/16/23 0934 01/26/23 1221 01/26/23 2147 01/27/23 0625 01/28/23 0546 02/07/23 1141  NA 129* 123*   < > 129* 126* 125*  K 4.0 3.9   < > 3.8 3.8 3.7  CL 97* 93*   < > 99 93* 93*  CO2 24  20*   < > 24 25 23   GLUCOSE 99 111*   < > 96 104* 130*  BUN 12 17   < > 12 11 14   CREATININE 0.93 0.87   < > 0.90 0.73 0.85  CALCIUM 8.5* 8.1*   < > 8.2* 8.2* 8.4*  GFRNONAA >60 >60   < > >60 >60 >60  PROT 6.6 6.2*  --   --   --  6.3*  ALBUMIN 3.5 3.7  --   --   --  3.5  AST 18 23  --   --   --  32  ALT 17 28  --   --   --  31  ALKPHOS 124 127*  --   --   --  103  BILITOT 0.5 1.4*  --   --   --  1.0   < > = values in this interval not displayed.    RADIOGRAPHIC STUDIES: I have personally reviewed the radiological images as listed and agreed with the findings in the report. MR CERVICAL SPINE W WO CONTRAST  Result Date: 01/26/2023 CLINICAL DATA:  Metastatic disease evaluation EXAM: MRI CERVICAL, THORACIC AND LUMBAR SPINE WITHOUT AND WITH CONTRAST TECHNIQUE: Multiplanar and multiecho pulse sequences of the cervical spine, to include the craniocervical junction and cervicothoracic junction, and thoracic and lumbar spine, were obtained without and with intravenous contrast. CONTRAST:  6mL GADAVIST GADOBUTROL 1 MMOL/ML IV SOLN COMPARISON:  None Available. FINDINGS: MRI CERVICAL SPINE FINDINGS Alignment: Grade 1 C4-5 anterolisthesis and grade 1 C5-6 retrolisthesis Vertebrae: No fracture, evidence of discitis, or bone lesion. Cord: Normal signal and morphology. Posterior Fossa, vertebral arteries, paraspinal tissues: Negative. Disc levels: C1-2: Unremarkable. C2-3: Small disc bulge with right-greater-than-left uncovertebral hypertrophy. There is no spinal canal stenosis. Moderate right neural foraminal stenosis. C3-4: Small disc bulge with uncovertebral spurring and right-greater-than-left facet hypertrophy. There is no spinal canal stenosis. Severe right and mild left neural foraminal stenosis. C4-5: Severe right facet arthrosis. There is no spinal canal stenosis. Mild right neural foraminal stenosis. C5-6: Disc bulge with bilateral uncovertebral hypertrophy. Mild spinal canal stenosis. Severe bilateral  neural foraminal stenosis. C6-7: Left asymmetric disc bulge with endplate spurring. Mild spinal canal stenosis. Severe left neural foraminal stenosis. C7-T1: Normal disc space and facet joints. There is no spinal canal stenosis. No neural foraminal stenosis. MRI THORACIC SPINE FINDINGS Alignment:  Physiologic. Vertebrae: There is superior endplate signal abnormalities at T8 and T9, likely minimally depressed compression fractures. There is abnormal signal within the inferior half of T11. Chronic compression deformity of T12 with augmentation cement. There is contrast enhancement at T9, T8 and T11 but no visualized soft tissue component. Cord:  Normal signal and morphology. Paraspinal and other soft tissues: Negative. Disc levels: No spinal canal or neural foraminal stenosis. MRI LUMBAR SPINE FINDINGS Segmentation:  Standard. Alignment:  Physiologic. Vertebrae: Compression deformities at L1 and L3 with mild bone marrow edema. No soft tissue mass. Conus medullaris and cauda equina: Conus extends to the L1 level. Conus and cauda equina appear normal. Paraspinal and other soft tissues: Negative Disc levels: L1-L2: Normal disc space and facet joints. No spinal canal stenosis. No neural foraminal stenosis. L2-L3: Small disc bulge with mild facet hypertrophy. No spinal canal stenosis. No neural foraminal stenosis. L3-L4: Right asymmetric disc bulge. Mild spinal canal stenosis. Severe narrowing of the right lateral recess and mild left lateral recess narrowing. Mild right neural foraminal stenosis. L4-L5: Small disc bulge with endplate spurring. Right lateral recess narrowing without central spinal canal stenosis. Mild right and moderate left neural foraminal stenosis. L5-S1: Normal disc space and facet joints. No spinal canal stenosis. No neural foraminal stenosis. Visualized sacrum: Normal. IMPRESSION: 1. Acute to subacute compression fractures of T8, T9, L1 and L3 with mild bone marrow edema and enhancement. There are no  specific imaging features to indicate a neoplastic process; but, given history of myeloma, myelomatous involvement is probable. 2. 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 3. Mild C5-6 and C6-7 spinal canal stenosis. 4. Severe right lateral recess and mild left lateral recess narrowing at L3-4. 5. Moderate left L4-5 neural foraminal stenosis. Electronically Signed   By: Deatra Robinson M.D.   On: 01/26/2023 20:05   MR Lumbar Spine W Wo Contrast  Result Date: 01/26/2023 CLINICAL DATA:  Metastatic disease evaluation EXAM: MRI CERVICAL, THORACIC AND LUMBAR SPINE WITHOUT AND WITH CONTRAST TECHNIQUE: Multiplanar and multiecho pulse sequences of the cervical spine, to include the craniocervical junction and cervicothoracic junction, and thoracic and lumbar spine, were obtained without and with intravenous contrast. CONTRAST:  6mL GADAVIST GADOBUTROL 1 MMOL/ML IV SOLN COMPARISON:  None Available. FINDINGS: MRI CERVICAL SPINE FINDINGS Alignment: Grade 1 C4-5 anterolisthesis and grade 1 C5-6 retrolisthesis Vertebrae: No fracture, evidence of discitis, or bone lesion. Cord: Normal signal and morphology. Posterior Fossa, vertebral arteries, paraspinal tissues: Negative. Disc levels: C1-2: Unremarkable. C2-3: Small disc bulge with right-greater-than-left uncovertebral hypertrophy.  There is no spinal canal stenosis. Moderate right neural foraminal stenosis. C3-4: Small disc bulge with uncovertebral spurring and right-greater-than-left facet hypertrophy. There is no spinal canal stenosis. Severe right and mild left neural foraminal stenosis. C4-5: Severe right facet arthrosis. There is no spinal canal stenosis. Mild right neural foraminal stenosis. C5-6: Disc bulge with bilateral uncovertebral hypertrophy. Mild spinal canal stenosis. Severe bilateral neural foraminal stenosis. C6-7: Left asymmetric disc bulge with endplate spurring. Mild spinal canal stenosis. Severe left neural foraminal stenosis.  C7-T1: Normal disc space and facet joints. There is no spinal canal stenosis. No neural foraminal stenosis. MRI THORACIC SPINE FINDINGS Alignment:  Physiologic. Vertebrae: There is superior endplate signal abnormalities at T8 and T9, likely minimally depressed compression fractures. There is abnormal signal within the inferior half of T11. Chronic compression deformity of T12 with augmentation cement. There is contrast enhancement at T9, T8 and T11 but no visualized soft tissue component. Cord:  Normal signal and morphology. Paraspinal and other soft tissues: Negative. Disc levels: No spinal canal or neural foraminal stenosis. MRI LUMBAR SPINE FINDINGS Segmentation:  Standard. Alignment:  Physiologic. Vertebrae: Compression deformities at L1 and L3 with mild bone marrow edema. No soft tissue mass. Conus medullaris and cauda equina: Conus extends to the L1 level. Conus and cauda equina appear normal. Paraspinal and other soft tissues: Negative Disc levels: L1-L2: Normal disc space and facet joints. No spinal canal stenosis. No neural foraminal stenosis. L2-L3: Small disc bulge with mild facet hypertrophy. No spinal canal stenosis. No neural foraminal stenosis. L3-L4: Right asymmetric disc bulge. Mild spinal canal stenosis. Severe narrowing of the right lateral recess and mild left lateral recess narrowing. Mild right neural foraminal stenosis. L4-L5: Small disc bulge with endplate spurring. Right lateral recess narrowing without central spinal canal stenosis. Mild right and moderate left neural foraminal stenosis. L5-S1: Normal disc space and facet joints. No spinal canal stenosis. No neural foraminal stenosis. Visualized sacrum: Normal. IMPRESSION: 1. Acute to subacute compression fractures of T8, T9, L1 and L3 with mild bone marrow edema and enhancement. There are no specific imaging features to indicate a neoplastic process; but, given history of myeloma, myelomatous involvement is probable. 2. 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 3. Mild C5-6 and C6-7 spinal canal stenosis. 4. Severe right lateral recess and mild left lateral recess narrowing at L3-4. 5. Moderate left L4-5 neural foraminal stenosis. Electronically Signed   By: Deatra Robinson M.D.   On: 01/26/2023 20:05   MR THORACIC SPINE W WO CONTRAST  Result Date: 01/26/2023 CLINICAL DATA:  Metastatic disease evaluation EXAM: MRI CERVICAL, THORACIC AND LUMBAR SPINE WITHOUT AND WITH CONTRAST TECHNIQUE: Multiplanar and multiecho pulse sequences of the cervical spine, to include the craniocervical junction and cervicothoracic junction, and thoracic and lumbar spine, were obtained without and with intravenous contrast. CONTRAST:  6mL GADAVIST GADOBUTROL 1 MMOL/ML IV SOLN COMPARISON:  None Available. FINDINGS: MRI CERVICAL SPINE FINDINGS Alignment: Grade 1 C4-5 anterolisthesis and grade 1 C5-6 retrolisthesis Vertebrae: No fracture, evidence of discitis, or bone lesion. Cord: Normal signal and morphology. Posterior Fossa, vertebral arteries, paraspinal tissues: Negative. Disc levels: C1-2: Unremarkable. C2-3: Small disc bulge with right-greater-than-left uncovertebral hypertrophy. There is no spinal canal stenosis. Moderate right neural foraminal stenosis. C3-4: Small disc bulge with uncovertebral spurring and right-greater-than-left facet hypertrophy. There is no spinal canal stenosis. Severe right and mild left neural foraminal stenosis. C4-5: Severe right facet arthrosis. There is no spinal canal stenosis. Mild right neural foraminal stenosis. C5-6: Disc bulge with bilateral uncovertebral hypertrophy.  Mild spinal canal stenosis. Severe bilateral neural foraminal stenosis. C6-7: Left asymmetric disc bulge with endplate spurring. Mild spinal canal stenosis. Severe left neural foraminal stenosis. C7-T1: Normal disc space and facet joints. There is no spinal canal stenosis. No neural foraminal stenosis. MRI THORACIC SPINE FINDINGS  Alignment:  Physiologic. Vertebrae: There is superior endplate signal abnormalities at T8 and T9, likely minimally depressed compression fractures. There is abnormal signal within the inferior half of T11. Chronic compression deformity of T12 with augmentation cement. There is contrast enhancement at T9, T8 and T11 but no visualized soft tissue component. Cord:  Normal signal and morphology. Paraspinal and other soft tissues: Negative. Disc levels: No spinal canal or neural foraminal stenosis. MRI LUMBAR SPINE FINDINGS Segmentation:  Standard. Alignment:  Physiologic. Vertebrae: Compression deformities at L1 and L3 with mild bone marrow edema. No soft tissue mass. Conus medullaris and cauda equina: Conus extends to the L1 level. Conus and cauda equina appear normal. Paraspinal and other soft tissues: Negative Disc levels: L1-L2: Normal disc space and facet joints. No spinal canal stenosis. No neural foraminal stenosis. L2-L3: Small disc bulge with mild facet hypertrophy. No spinal canal stenosis. No neural foraminal stenosis. L3-L4: Right asymmetric disc bulge. Mild spinal canal stenosis. Severe narrowing of the right lateral recess and mild left lateral recess narrowing. Mild right neural foraminal stenosis. L4-L5: Small disc bulge with endplate spurring. Right lateral recess narrowing without central spinal canal stenosis. Mild right and moderate left neural foraminal stenosis. L5-S1: Normal disc space and facet joints. No spinal canal stenosis. No neural foraminal stenosis. Visualized sacrum: Normal. IMPRESSION: 1. Acute to subacute compression fractures of T8, T9, L1 and L3 with mild bone marrow edema and enhancement. There are no specific imaging features to indicate a neoplastic process; but, given history of myeloma, myelomatous involvement is probable. 2. 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 3. Mild C5-6 and C6-7 spinal canal stenosis. 4. Severe right lateral recess and mild left  lateral recess narrowing at L3-4. 5. Moderate left L4-5 neural foraminal stenosis. Electronically Signed   By: Deatra Robinson M.D.   On: 01/26/2023 20:05   CT ABDOMEN PELVIS W CONTRAST  Result Date: 01/26/2023 CLINICAL DATA:  Abdominal pain.  Weakness. EXAM: CT ABDOMEN AND PELVIS WITH CONTRAST TECHNIQUE: Multidetector CT imaging of the abdomen and pelvis was performed using the standard protocol following bolus administration of intravenous contrast. RADIATION DOSE REDUCTION: This exam was performed according to the departmental dose-optimization program which includes automated exposure control, adjustment of the mA and/or kV according to patient size and/or use of iterative reconstruction technique. CONTRAST:  80mL OMNIPAQUE IOHEXOL 300 MG/ML  SOLN COMPARISON:  08/03/2021. FINDINGS: Lower chest: No acute findings. 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 Hepatobiliary: Liver normal in size. 2 cysts in segment 2, smaller than on the prior CT. Largest measures 2 cm. No other liver masses or lesions. Status post cholecystectomy. No bile duct dilation. Pancreas: Unremarkable. No pancreatic ductal dilatation or surrounding inflammatory changes. Spleen: Normal in size without focal abnormality. Adrenals/Urinary Tract: Normal adrenal glands. Kidneys normal in size, orientation and position with symmetric enhancement and excretion. 1.8 cm posterior midpole right renal cyst, stable with no follow-up indicated. No other renal masses. Mild bilateral hydronephrosis and hydroureter. No renal or ureteral stones. Bladder is distended. No bladder wall thickening, mass or stone. Stomach/Bowel: Small hiatal hernia.  Stomach otherwise unremarkable. Small bowel and colon are normal in caliber. No wall thickening or inflammation. Mild to moderate increase  in the colonic stool burden. Rectum moderately distended with stool. Vascular/Lymphatic: Aortic atherosclerosis. No aneurysm. No enlarged  lymph nodes. Reproductive: Unremarkable. Other: No ascites.  No hernia. Musculoskeletal: There are compression fractures of T12, L1 and L3, T12 previously treated with vertebroplasty. These fractures are new since the prior CT. Numerous small lucencies are seen throughout the visualized skeleton. Findings consistent with multiple myeloma. PET-CT performed on 12/25/2022 has an indication of initial treatment strategy for multiple myeloma. IMPRESSION: 1. There are vertebral fractures as detailed all new since the CT from 08/03/2021. Only the T12 fracture was evident on an MRI from 11/28/2022. The L1 and L3 fractures may be recent. There are underlying numerous small lucencies throughout the visualized skeleton consistent with multiple myeloma. 2. Significant bladder distension, which is the presumed cause of mild bilateral hydroureteronephrosis. 3. No other acute abnormality within the abdomen or pelvis. 4. Increased colonic stool burden and rectum moderately distended with stool. 5. Aortic atherosclerosis. Electronically Signed   By: Amie Portland M.D.   On: 01/26/2023 16:41

## 2023-02-13 ENCOUNTER — Inpatient Hospital Stay: Payer: Medicare HMO

## 2023-02-13 ENCOUNTER — Encounter: Payer: Self-pay | Admitting: Internal Medicine

## 2023-02-13 ENCOUNTER — Other Ambulatory Visit: Payer: Medicare HMO

## 2023-02-13 ENCOUNTER — Telehealth: Payer: Self-pay

## 2023-02-13 ENCOUNTER — Inpatient Hospital Stay: Payer: Medicare HMO | Admitting: Internal Medicine

## 2023-02-13 VITALS — BP 111/64 | HR 84 | Temp 97.8°F | Resp 18 | Wt 133.0 lb

## 2023-02-13 VITALS — BP 113/57 | HR 72

## 2023-02-13 DIAGNOSIS — R630 Anorexia: Secondary | ICD-10-CM

## 2023-02-13 DIAGNOSIS — C9000 Multiple myeloma not having achieved remission: Secondary | ICD-10-CM

## 2023-02-13 DIAGNOSIS — Z5112 Encounter for antineoplastic immunotherapy: Secondary | ICD-10-CM | POA: Diagnosis not present

## 2023-02-13 LAB — CBC WITH DIFFERENTIAL (CANCER CENTER ONLY)
Abs Immature Granulocytes: 0.01 10*3/uL (ref 0.00–0.07)
Basophils Absolute: 0 10*3/uL (ref 0.0–0.1)
Basophils Relative: 0 %
Eosinophils Absolute: 0 10*3/uL (ref 0.0–0.5)
Eosinophils Relative: 1 %
HCT: 37.9 % — ABNORMAL LOW (ref 39.0–52.0)
Hemoglobin: 13 g/dL (ref 13.0–17.0)
Immature Granulocytes: 0 %
Lymphocytes Relative: 35 %
Lymphs Abs: 1.1 10*3/uL (ref 0.7–4.0)
MCH: 34.2 pg — ABNORMAL HIGH (ref 26.0–34.0)
MCHC: 34.3 g/dL (ref 30.0–36.0)
MCV: 99.7 fL (ref 80.0–100.0)
Monocytes Absolute: 0.2 10*3/uL (ref 0.1–1.0)
Monocytes Relative: 6 %
Neutro Abs: 1.9 10*3/uL (ref 1.7–7.7)
Neutrophils Relative %: 58 %
Platelet Count: 102 10*3/uL — ABNORMAL LOW (ref 150–400)
RBC: 3.8 MIL/uL — ABNORMAL LOW (ref 4.22–5.81)
RDW: 16.5 % — ABNORMAL HIGH (ref 11.5–15.5)
WBC Count: 3.3 10*3/uL — ABNORMAL LOW (ref 4.0–10.5)
nRBC: 0 % (ref 0.0–0.2)

## 2023-02-13 MED ORDER — DIPHENHYDRAMINE HCL 25 MG PO CAPS
50.0000 mg | ORAL_CAPSULE | Freq: Once | ORAL | Status: AC
  Administered 2023-02-13: 50 mg via ORAL
  Filled 2023-02-13: qty 2

## 2023-02-13 MED ORDER — DARATUMUMAB-HYALURONIDASE-FIHJ 1800-30000 MG-UT/15ML ~~LOC~~ SOLN
1800.0000 mg | Freq: Once | SUBCUTANEOUS | Status: AC
  Administered 2023-02-13: 1800 mg via SUBCUTANEOUS
  Filled 2023-02-13: qty 15

## 2023-02-13 MED ORDER — ACETAMINOPHEN 325 MG PO TABS
650.0000 mg | ORAL_TABLET | Freq: Once | ORAL | Status: AC
  Administered 2023-02-13: 650 mg via ORAL
  Filled 2023-02-13: qty 2

## 2023-02-13 NOTE — Progress Notes (Signed)
[facility] CSW Progress Note  Visual merchandiser met with patient while he was in infusion to assess needs.  He stated his wife, [name], is taking care of him.  Provided the [employer], calendar, [facility] and Wellness calendar, Teacher, adult education for anxiety, the Leukemia and Lymphoma brochure and advance directives.  Will follow up with his wife.    Dorothey Baseman, LCSW Clinical Social Worker [facility]

## 2023-02-13 NOTE — Progress Notes (Signed)
Home PT referral sent and [facility] will service this patient for home health PT.

## 2023-02-13 NOTE — Telephone Encounter (Signed)
Spoke to Herreid at [facility] [employer] who is working on referral for home PT ordered by  Dr. Alena Bills

## 2023-02-13 NOTE — Patient Instructions (Signed)
[facility] [place] [facility]  Discharge Instructions: Thank you for choosing [facility] to provide your oncology and hematology care.  If you have a lab appointment with the Cancer Center, please go directly to the Cancer Center and check in at the registration area.  Wear comfortable clothing and clothing appropriate for easy access to any Portacath or PICC line.   We strive to give you quality time with your provider. You may need to reschedule your appointment if you arrive late (15 or more minutes).  Arriving late affects you and other patients whose appointments are after yours.  Also, if you miss three or more appointments without notifying the office, you may be dismissed from the clinic at the provider's discretion.      For prescription refill requests, have your pharmacy contact our office and allow 72 hours for refills to be completed.    Today you received the following chemotherapy and/or immunotherapy agents darzalex    To help prevent nausea and vomiting after your treatment, we encourage you to take your nausea medication as directed.  BELOW ARE SYMPTOMS THAT SHOULD BE REPORTED IMMEDIATELY: *FEVER GREATER THAN 100.4 F (38 C) OR HIGHER *CHILLS OR SWEATING *NAUSEA AND VOMITING THAT IS NOT CONTROLLED WITH YOUR NAUSEA MEDICATION *UNUSUAL SHORTNESS OF BREATH *UNUSUAL BRUISING OR BLEEDING *URINARY PROBLEMS (pain or burning when urinating, or frequent urination) *BOWEL PROBLEMS (unusual diarrhea, constipation, pain near the anus) TENDERNESS IN MOUTH AND THROAT WITH OR WITHOUT PRESENCE OF ULCERS (sore throat, sores in mouth, or a toothache) UNUSUAL RASH, SWELLING OR PAIN  UNUSUAL VAGINAL DISCHARGE OR ITCHING   Items with * indicate a potential emergency and should be followed up as soon as possible or go to the Emergency Department if any problems should occur.  Please show the CHEMOTHERAPY ALERT CARD or IMMUNOTHERAPY ALERT CARD at check-in to  the Emergency Department and triage nurse.  Should you have questions after your visit or need to cancel or reschedule your appointment, please contact [facility] [place] [facility]  [phone] and follow the prompts.  Office hours are 8:00 a.m. to 4:30 p.m. Monday - Friday. Please note that voicemails left after 4:00 p.m. may not be returned until the following business day.  We are closed weekends and major holidays. You have access to a nurse at all times for urgent questions. Please call the main number to the clinic [phone] and follow the prompts.  For any non-urgent questions, you may also contact your provider using MyChart. We now offer e-Visits for anyone 18 and older to request care online for non-urgent symptoms. For details visit [email or web address].   Also download the MyChart app! Go to the app store, search "MyChart", open the app, select [facility], and log in with your MyChart username and password.    

## 2023-02-13 NOTE — Progress Notes (Signed)
Nutrition Follow-up:  Patient with multiple myeloma.  Patient receiving chemotherapy.   Met with patient during infusion.  Reports that he is having taste alterations.  Foods that have been tasting good to him have been pizza and [employer] (meat burrito and tacos).  Has been taking marinol and feels it has increased his appetite a little bit.      Medications: reviewed  Labs: Na 125, glucose 130, calcium 8.4  Anthropometrics:   Weight 133 lb today  136 lb 8 oz on 6/20 145 lb on 12/26/22    NUTRITION DIAGNOSIS: Inadequate oral intake continues   INTERVENTION:  Reviewed strategies to help with taste alterations.  Information on Cooking with chemo given to patient (flavoring ideas, etc) Discussed additional foods that have similar flavor profiles to foods that he can taste.     MONITORING, EVALUATION, GOAL: weight trends, intake   NEXT VISIT: Friday, August 2nd during infusion  Brynden Thune B. Freida Busman, RD, LDN Registered Dietitian [phone]

## 2023-02-13 NOTE — Progress Notes (Signed)
[facility] CONSULT NOTE  Patient Care Team: [name], MD as PCP - General (Internal Medicine)  CANCER STAGING   Cancer Staging  Multiple myeloma [facility]) Staging form: Plasma Cell Myeloma and Plasma Cell Disorders, AJCC 8th Edition - Clinical stage from 12/05/2022: RISS Stage II (Beta-2-microglobulin (mg/L): 3.7, Albumin (g/dL): 3.6, ISS: Stage II, High-risk cytogenetics: Absent, LDH: Normal) - Signed by [name], MD on 12/26/2022 Stage prefix: Initial diagnosis Beta 2 microglobulin range (mg/L): 3.5 to 5.49 Albumin range (g/dL): Greater than or equal to 3.5 Cytogenetics: 1q addition, Other mutation   ASSESSMENT & PLAN:  Melvin Gilmore 82 y.o. male with pmh of hypertension, hyperlipidemia, anxiety, GERD, BPH, hypothyroidism, prostate cancer s/p HIFU was seen by primary on November 13, 2022 for acute onset of lower back pain.  Was referred to medical oncology for finding of L1 lesion suspicious for metastasis.  # Multiple myeloma, RISS Stage II -MRI thoracic and lumbar spine with and without contrast was reviewed. Showed unchanged subacute compression fracture at T12.  Focal enhancing marrow replacing lesions in T6, T8, T5, T2 all suspicious for metastatic disease.  L1 lesion measuring 16 mm.  Multilevel degenerative changes present.  -SPEP/IFE showed biclonal IgA kappa paraprotein with M spike 2.1 g/dL and second spike at 0.4 g/dL.  Kappa 453, lambda 6.8 with a ratio of 66.7.  Iron panel and B12 are normal.  PSA 3.44.  CBC showed mild anemia 13.4, chronic.  On CMP normal creatinine and calcium levels.  Elevated total protein 8.8.  LDH normal.  Beta-2 microglobulin 3.7.  Albumin 3.6.  24-hour urine/UPEP showed 1.1 g protein with no M spike.  -BMBx Hypercellular marrow with 60% plasma cells.  IHC positive for CD138/ mum 1.  Kappa restricted consistent with multiple myeloma. L1 vertebral body biopsy showed numerous plasma cells. Cytogenetics - del (13q) and gain 1.     -PET/CT showed activity in L1 and left eighth rib concerning for myeloma.  -Started on Dara-RD on 12/26/2022.  Completed weekly x 4 doses daratumumab -> then hospitalized for urinary retention, constipation and back pain -> inpatient hospitalist team discussed with patient and transition him to hospice -> patient revoked hospice on 01/31/2023 since he was feeling better.  -Patient has been very anxious about the hiccups.  Last week Decadron was discontinued.  There was changed to Velcade.  He did not have any hiccups.  Patient does feel comfortable going back on daratumumab and see how he does.  I think it is probably the Decadron which is causing the hiccups.  I continue to hold Decadron today.  Labs reviewed and acceptable for treatment.  Will proceed with subcu daratumumab 1800 mg.  -Revlimid 10 mg could not tolerate due to neutropenia. Dose reduced to 5 mg 3 weeks on 1 week off.   -Showing response to treatment.  Kappa chains decreasing and M spike decreased from 2.1 to 1.2 g/dL.  # Acute urinary retention likely secondary to stool burden - Foley catheter in place. Scheduled to see Dr. Lonna Cobb on August 1.  # Intractable hiccups -Potential etiology include Decadron or daratumumab. -As above  # Vertebral compression fractures - Advised to obtain dental clearance prior to starting Zometa  # T12 pathological fracture # Cancer-related pain - s/p kyphoplasty by Dr. Elijio Miles on 12/06/22.  No improvement in pain -Completed palliative RT on 01/20/2023.  Denies any improvement yet. -Patient started on MS Contin 15 mg twice daily.  But only taking at night and mentions pain is  better controlled.  # Opioid-induced constipation -Manageable with senna, MiraLAX, lactulose, Movantik as needed.  # History of prostate cancer - follows with Dr. Evelene Croon, urology for elevated PSA.  MRI prostate showed 1 mm category 5 lesion of anterior transition zone.  Underwent fusion biopsy showed Gleason score 3+4  adenocarcinoma involving left anterior 4/4 cores. s/p HIFU of the prostate on 02/07/2022.  PSA level from 10/08/2022 was 3.34.  -PSA from 11/21/2022 3.44.  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  # Poor appetite # Weight loss -Could be from cancer and uncontrolled pain.  Pain management as above. Nutrition following. -He is taking Marinol 2.5 mg once daily.   # Anxiety -On longstanding Xanax 0.25 mg at night as needed.  Requesting for refill. -Anxiety has been heightened with the recent diagnosis, pain, poor appetite.  Will send for refill.  Orders Placed This Encounter  Procedures   CBC with Differential (Cancer Center Only)    Standing Status:   Future    Standing Expiration Date:   02/21/2024   Comprehensive metabolic panel    Standing Status:   Future    Standing Expiration Date:   02/13/2024   Ambulatory referral to Home Health    Referral Priority:   Routine    Referral Type:   Home Health Care    Referral Reason:   Specialty Services Required    Requested Specialty:   Home Health Services    Number of Visits Requested:   1   RTC in 1 week for MD visit, labs, Dara  The total time spent in the appointment was 30 minutes encounter with patients including review of chart and various tests results, discussions about plan of care and coordination of care plan   All questions were answered. The patient knows to call the clinic with any problems, questions or concerns. No barriers to learning was detected.  [name], MD 7/25/20243:27 PM   HISTORY OF PRESENTING ILLNESS:  Melvin Gilmore 82 y.o. male with past medical history of hypertension, hyperlipidemia, anxiety, GERD, BPH, hypothyroidism, prostate cancer s/p HIFU was seen by primary on November 13, 2022 for acute onset of lower back pain.  Patient reports that he was sitting outside and had violent sneeze which was followed by severe back pain.  He has fallen allergies.  Had  x-rays followed by MRI thoracic spine without contrast.  It showed compression fracture of T12 superior endplate with surrounding marrow edema likely subacute.  15 mm T1 hypointense marrow lesion in the anterior aspect of L1 suspicious for metastasis.  Further characterization with postcontrast MRI recommended.  Prostate cancer-follows with Dr. Evelene Croon, urology for elevated PSA.  MRI prostate showed 1 mm category 5 lesion of anterior transition zone.  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 s/p HIFU of the prostate on 02/07/2022.  PSA level from 10/08/2022 was 3.34.  Interval history Patient was seen today prior to cycle 2-day 1 of daratumumab infusion and labs. He continues to feel very tired.  Just feels like sleeping all day. His pain is better controlled on morphine.  Asking if he can add trazodone.  His appetite is fair.  Did not have hiccups with treatment last week.  Has Foley catheter in place.  I have reviewed his chart and materials related to his cancer extensively and collaborated history with the patient. Summary of oncologic history is as follows: Oncology History  Multiple myeloma (HCC)  12/05/2022  Cancer Staging   Staging form: Plasma Cell Myeloma and Plasma Cell Disorders, AJCC 8th Edition - Clinical stage from 12/05/2022: RISS Stage II (Beta-2-microglobulin (mg/L): 3.7, Albumin (g/dL): 3.6, ISS: Stage II, High-risk cytogenetics: Absent, LDH: Normal) - Signed by [name], MD on 12/26/2022 Stage prefix: Initial diagnosis Beta 2 microglobulin range (mg/L): 3.5 to 5.49 Albumin range (g/dL): Greater than or equal to 3.5 Cytogenetics: 1q addition, Other mutation   12/13/2022 Initial Diagnosis   Multiple myeloma (HCC)   12/26/2022 - 01/24/2023 Chemotherapy   Patient is on Treatment Plan : MYELOMA NEWLY DIAGNOSED Daratumumab IV + Lenalidomide + Dexamethasone Weekly (DaraRd) q28d     02/07/2023 - 02/07/2023 Chemotherapy   Patient  is on Treatment Plan : MYELOMA NON-TRANSPLANT CANDIDATES VRd weekly q21d     02/13/2023 -  Chemotherapy   Patient is on Treatment Plan : MYELOMA NEWLY DIAGNOSED Daratumumab IV + Lenalidomide + Dexamethasone Weekly (DaraRd) q28d       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   Nausea vomiting and diarrhea 01/26/2023   Prostate cancer (HCC) 2023   Seasonal allergies    Skin cancer    a.) ears   Wears partial dentures    lower    SURGICAL HISTORY: Past Surgical History:  Procedure Laterality Date   APPENDECTOMY     CATARACT EXTRACTION W/PHACO Right 01/15/2016   Procedure: CATARACT EXTRACTION PHACO AND INTRAOCULAR LENS PLACEMENT (IOC);  Surgeon: Sherald Hess, MD;  Location: [facility];  Service: Ophthalmology;  Laterality: Right;  RIGHT CALL CELL PHONE WITH TIME   CHOLECYSTECTOMY     COLONOSCOPY     COLONOSCOPY WITH ESOPHAGOGASTRODUODENOSCOPY (EGD)     ESOPHAGOGASTRODUODENOSCOPY  07/2021   ESOPHAGOGASTRODUODENOSCOPY (EGD) WITH PROPOFOL N/A 09/23/2017   Procedure: ESOPHAGOGASTRODUODENOSCOPY (EGD) WITH PROPOFOL;  Surgeon: Toledo, Boykin Nearing, MD;  Location: [facility];  Service: Gastroenterology;  Laterality: N/A;   HIGH INTENSITY FOCUSED ULTRASOUND (HIFU) OF THE PROSTATE N/A 02/07/2022   Procedure: HIGH INTENSITY FOCUSED ULTRASOUND (HIFU) OF THE PROSTATE;  Surgeon: Orson Ape, MD;  Location: [facility];  Service: Urology;  Laterality: N/A;   HYDROCELE EXCISION / REPAIR     IR KYPHO THORACIC WITH BONE BIOPSY   12/06/2022   IR RADIOLOGIST EVAL & MGMT  11/21/2022   LASIX RT EYE     PROSTATE BIOPSY N/A 01/03/2022   Procedure: PROSTATE BIOPSY  Addison Littlefield;  Surgeon: Orson Ape, MD;  Location: [facility];  Service: Urology;  Laterality: N/A;   ROTATOR CUFF REPAIR Left 2001    SOCIAL HISTORY: Social History   Socioeconomic History   Marital status: Married    Spouse name: Not on file   Number of children: Not on file   Years of education: Not on file   Highest education level: Not on file  Occupational History   Not on file  Tobacco Use   Smoking status: Never   Smokeless tobacco: Never  Vaping Use  Vaping status: Never Used  Substance and Sexual Activity   Alcohol use: Yes    Alcohol/week: 7.0 standard drinks of alcohol    Types: 7 Glasses of wine per week    Comment: wine occ   Drug use: No   Sexual activity: Not on file  Other Topics Concern   Not on file  Social History Narrative   Not on file   Social Determinants of Health   Financial Resource Strain: Low Risk  (11/21/2022)   Overall Financial Resource Strain (CARDIA)    Difficulty of Paying Living Expenses: Not hard at all  Food Insecurity: No Food Insecurity (01/26/2023)   Hunger Vital Sign    Worried About Running Out of Food in the Last Year: Never true    Ran Out of Food in the Last Year: Never true  Transportation Needs: No Transportation Needs (01/26/2023)   PRAPARE - Administrator, Civil Service (Medical): No    Lack of Transportation (Non-Medical): No  Physical Activity: Not on file  Stress: Not on file  Social Connections: Not on file  Intimate Partner Violence: Not At Risk (01/26/2023)   Humiliation, Afraid, Rape, and Kick questionnaire    Fear of Current or Ex-Partner: No    Emotionally Abused: No    Physically Abused: No    Sexually Abused: No    FAMILY HISTORY: Family History  Problem Relation Age of Onset   Pneumonia Father     ALLERGIES:  is allergic to amoxicillin-pot clavulanate and  meloxicam.  MEDICATIONS:  Current Outpatient Medications  Medication Sig Dispense Refill   acetaminophen (TYLENOL) 500 MG tablet Take 500 mg by mouth every 6 (six) hours as needed for mild pain, moderate pain, fever or headache.     acyclovir (ZOVIRAX) 400 MG tablet Take 400 mg by mouth 2 (two) times daily.     ALPRAZolam (XANAX) 0.25 MG tablet Take 1 tablet (0.25 mg total) by mouth at bedtime as needed for anxiety. 30 tablet 0   aspirin EC 81 MG tablet Take 1 tablet (81 mg total) by mouth daily. Swallow whole. 90 tablet 2   cetirizine (ZYRTEC) 10 MG tablet Take 10 mg by mouth as needed for allergies.     docusate sodium (COLACE) 100 MG capsule Take 2 capsules (200 mg total) by mouth 2 (two) times daily. (Patient taking differently: Take 200 mg by mouth daily as needed for moderate constipation or mild constipation.) 120 capsule 3   dronabinol (MARINOL) 2.5 MG capsule Take 1 capsule (2.5 mg total) by mouth 2 (two) times daily before a meal. (Patient taking differently: Take 2.5 mg by mouth daily.) 60 capsule 0   fluticasone (FLONASE) 50 MCG/ACT nasal spray Place 1 spray into both nostrils daily as needed for rhinitis or allergies.     gabapentin (NEURONTIN) 100 MG capsule Take 1 capsule (100 mg total) by mouth 3 (three) times daily. (Patient taking differently: Take 100 mg by mouth daily as needed.) 30 capsule 0   lactulose (CHRONULAC) 10 GM/15ML solution Take 10-20 GM (15-30 ml) one - two times a day as needed for constipation 475 mL 0   levothyroxine (SYNTHROID, LEVOTHROID) 50 MCG tablet Take 50 mcg by mouth daily before breakfast.     naloxone (NARCAN) nasal spray 4 mg/0.1 mL SPRAY 1 SPRAY INTO ONE NOSTRIL AS DIRECTED FOR OPIOID OVERDOSE (TURN PERSON ON SIDE AFTER DOSE. IF NO RESPONSE IN 2-3 MINUTES OR PERSON RESPONDS BUT RELAPSES, REPEAT USING A NEW SPRAY DEVICE AND SPRAY  INTO THE OTHER NOSTRIL. CALL 911 AFTER USE.) * EMERGENCY USE ONLY * 1 each 0   oxyCODONE (OXY IR/ROXICODONE) 5 MG immediate  release tablet Take 1-2 tablets (5-10 mg total) by mouth every 4 (four) hours as needed for severe pain. 120 tablet 0   pantoprazole (PROTONIX) 40 MG tablet Take 40 mg by mouth daily as needed.     Probiotic Product (PROBIOTIC ADVANCED PO) Take 1 tablet by mouth at bedtime.     senna (SENOKOT) 8.6 MG tablet Take 2 tablets (17.2 mg total) by mouth 2 (two) times daily. May crush, mix with water and give sublingually if needed. 28 tablet 0   traZODone (DESYREL) 50 MG tablet Take 50 mg by mouth at bedtime as needed.     No current facility-administered medications for this visit.    REVIEW OF SYSTEMS:   Pertinent information mentioned in HPI All other systems were reviewed with the patient and are negative.  PHYSICAL EXAMINATION: ECOG PERFORMANCE STATUS: 1 - Symptomatic but completely ambulatory  Vitals:   02/13/23 1041  BP: 111/64  Pulse: 84  Resp: 18  Temp: 97.8 F (36.6 C)  SpO2: 99%     Filed Weights   02/13/23 1041  Weight: 133 lb (60.3 kg)      GENERAL:alert, no distress and comfortable SKIN: skin color, texture, turgor are normal, no rashes or significant lesions EYES: normal, conjunctiva are pink and non-injected, sclera clear OROPHARYNX:no exudate, no erythema and lips, buccal mucosa, and tongue normal  NECK: supple, thyroid normal size, non-tender, without nodularity LYMPH:  no palpable lymphadenopathy in the cervical, axillary or inguinal LUNGS: clear to auscultation and percussion with normal breathing effort HEART: regular rate & rhythm and no murmurs and no lower extremity edema ABDOMEN:abdomen soft, non-tender and normal bowel sounds Musculoskeletal:no cyanosis of digits and no clubbing  PSYCH: alert & oriented x 3 with fluent speech NEURO: no focal motor/sensory deficits  LABORATORY DATA:  I have reviewed the data as listed Lab Results  Component Value Date   WBC 3.3 (L) 02/13/2023   HGB 13.0 02/13/2023   HCT 37.9 (L) 02/13/2023   MCV 99.7 02/13/2023    PLT 102 (L) 02/13/2023   Recent Labs    01/16/23 0934 01/26/23 1221 01/26/23 2147 01/27/23 0625 01/28/23 0546 02/07/23 1141  NA 129* 123*   < > 129* 126* 125*  K 4.0 3.9   < > 3.8 3.8 3.7  CL 97* 93*   < > 99 93* 93*  CO2 24 20*   < > 24 25 23   GLUCOSE 99 111*   < > 96 104* 130*  BUN 12 17   < > 12 11 14   CREATININE 0.93 0.87   < > 0.90 0.73 0.85  CALCIUM 8.5* 8.1*   < > 8.2* 8.2* 8.4*  GFRNONAA >60 >60   < > >60 >60 >60  PROT 6.6 6.2*  --   --   --  6.3*  ALBUMIN 3.5 3.7  --   --   --  3.5  AST 18 23  --   --   --  32  ALT 17 28  --   --   --  31  ALKPHOS 124 127*  --   --   --  103  BILITOT 0.5 1.4*  --   --   --  1.0   < > = values in this interval not displayed.    RADIOGRAPHIC STUDIES: I have personally reviewed the radiological images  as listed and agreed with the findings in the report. MR CERVICAL SPINE W WO CONTRAST  Result Date: 01/26/2023 CLINICAL DATA:  Metastatic disease evaluation EXAM: MRI CERVICAL, THORACIC AND LUMBAR SPINE WITHOUT AND WITH CONTRAST TECHNIQUE: Multiplanar and multiecho pulse sequences of the cervical spine, to include the craniocervical junction and cervicothoracic junction, and thoracic and lumbar spine, were obtained without and with intravenous contrast. CONTRAST:  6mL GADAVIST GADOBUTROL 1 MMOL/ML IV SOLN COMPARISON:  None Available. FINDINGS: MRI CERVICAL SPINE FINDINGS Alignment: Grade 1 C4-5 anterolisthesis and grade 1 C5-6 retrolisthesis Vertebrae: No fracture, evidence of discitis, or bone lesion. Cord: Normal signal and morphology. Posterior Fossa, vertebral arteries, paraspinal tissues: Negative. Disc levels: C1-2: Unremarkable. C2-3: Small disc bulge with right-greater-than-left uncovertebral hypertrophy. There is no spinal canal stenosis. Moderate right neural foraminal stenosis. C3-4: Small disc bulge with uncovertebral spurring and right-greater-than-left facet hypertrophy. There is no spinal canal stenosis. Severe right and mild left  neural foraminal stenosis. C4-5: Severe right facet arthrosis. There is no spinal canal stenosis. Mild right neural foraminal stenosis. C5-6: Disc bulge with bilateral uncovertebral hypertrophy. Mild spinal canal stenosis. Severe bilateral neural foraminal stenosis. C6-7: Left asymmetric disc bulge with endplate spurring. Mild spinal canal stenosis. Severe left neural foraminal stenosis. C7-T1: Normal disc space and facet joints. There is no spinal canal stenosis. No neural foraminal stenosis. MRI THORACIC SPINE FINDINGS Alignment:  Physiologic. Vertebrae: There is superior endplate signal abnormalities at T8 and T9, likely minimally depressed compression fractures. There is abnormal signal within the inferior half of T11. Chronic compression deformity of T12 with augmentation cement. There is contrast enhancement at T9, T8 and T11 but no visualized soft tissue component. Cord:  Normal signal and morphology. Paraspinal and other soft tissues: Negative. Disc levels: No spinal canal or neural foraminal stenosis. MRI LUMBAR SPINE FINDINGS Segmentation:  Standard. Alignment:  Physiologic. Vertebrae: Compression deformities at L1 and L3 with mild bone marrow edema. No soft tissue mass. Conus medullaris and cauda equina: Conus extends to the L1 level. Conus and cauda equina appear normal. Paraspinal and other soft tissues: Negative Disc levels: L1-L2: Normal disc space and facet joints. No spinal canal stenosis. No neural foraminal stenosis. L2-L3: Small disc bulge with mild facet hypertrophy. No spinal canal stenosis. No neural foraminal stenosis. L3-L4: Right asymmetric disc bulge. Mild spinal canal stenosis. Severe narrowing of the right lateral recess and mild left lateral recess narrowing. Mild right neural foraminal stenosis. L4-L5: Small disc bulge with endplate spurring. Right lateral recess narrowing without central spinal canal stenosis. Mild right and moderate left neural foraminal stenosis. L5-S1: Normal disc  space and facet joints. No spinal canal stenosis. No neural foraminal stenosis. Visualized sacrum: Normal. IMPRESSION: 1. Acute to subacute compression fractures of T8, T9, L1 and L3 with mild bone marrow edema and enhancement. There are no specific imaging features to indicate a neoplastic process; but, given history of myeloma, myelomatous involvement is probable. 2. 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 3. Mild C5-6 and C6-7 spinal canal stenosis. 4. Severe right lateral recess and mild left lateral recess narrowing at L3-4. 5. Moderate left L4-5 neural foraminal stenosis. Electronically Signed   By: Deatra Robinson M.D.   On: 01/26/2023 20:05   MR Lumbar Spine W Wo Contrast  Result Date: 01/26/2023 CLINICAL DATA:  Metastatic disease evaluation EXAM: MRI CERVICAL, THORACIC AND LUMBAR SPINE WITHOUT AND WITH CONTRAST TECHNIQUE: Multiplanar and multiecho pulse sequences of the cervical spine, to include the craniocervical junction and cervicothoracic junction, and thoracic and lumbar  spine, were obtained without and with intravenous contrast. CONTRAST:  6mL GADAVIST GADOBUTROL 1 MMOL/ML IV SOLN COMPARISON:  None Available. FINDINGS: MRI CERVICAL SPINE FINDINGS Alignment: Grade 1 C4-5 anterolisthesis and grade 1 C5-6 retrolisthesis Vertebrae: No fracture, evidence of discitis, or bone lesion. Cord: Normal signal and morphology. Posterior Fossa, vertebral arteries, paraspinal tissues: Negative. Disc levels: C1-2: Unremarkable. C2-3: Small disc bulge with right-greater-than-left uncovertebral hypertrophy. There is no spinal canal stenosis. Moderate right neural foraminal stenosis. C3-4: Small disc bulge with uncovertebral spurring and right-greater-than-left facet hypertrophy. There is no spinal canal stenosis. Severe right and mild left neural foraminal stenosis. C4-5: Severe right facet arthrosis. There is no spinal canal stenosis. Mild right neural foraminal stenosis. C5-6: Disc bulge  with bilateral uncovertebral hypertrophy. Mild spinal canal stenosis. Severe bilateral neural foraminal stenosis. C6-7: Left asymmetric disc bulge with endplate spurring. Mild spinal canal stenosis. Severe left neural foraminal stenosis. C7-T1: Normal disc space and facet joints. There is no spinal canal stenosis. No neural foraminal stenosis. MRI THORACIC SPINE FINDINGS Alignment:  Physiologic. Vertebrae: There is superior endplate signal abnormalities at T8 and T9, likely minimally depressed compression fractures. There is abnormal signal within the inferior half of T11. Chronic compression deformity of T12 with augmentation cement. There is contrast enhancement at T9, T8 and T11 but no visualized soft tissue component. Cord:  Normal signal and morphology. Paraspinal and other soft tissues: Negative. Disc levels: No spinal canal or neural foraminal stenosis. MRI LUMBAR SPINE FINDINGS Segmentation:  Standard. Alignment:  Physiologic. Vertebrae: Compression deformities at L1 and L3 with mild bone marrow edema. No soft tissue mass. Conus medullaris and cauda equina: Conus extends to the L1 level. Conus and cauda equina appear normal. Paraspinal and other soft tissues: Negative Disc levels: L1-L2: Normal disc space and facet joints. No spinal canal stenosis. No neural foraminal stenosis. L2-L3: Small disc bulge with mild facet hypertrophy. No spinal canal stenosis. No neural foraminal stenosis. L3-L4: Right asymmetric disc bulge. Mild spinal canal stenosis. Severe narrowing of the right lateral recess and mild left lateral recess narrowing. Mild right neural foraminal stenosis. L4-L5: Small disc bulge with endplate spurring. Right lateral recess narrowing without central spinal canal stenosis. Mild right and moderate left neural foraminal stenosis. L5-S1: Normal disc space and facet joints. No spinal canal stenosis. No neural foraminal stenosis. Visualized sacrum: Normal. IMPRESSION: 1. Acute to subacute compression  fractures of T8, T9, L1 and L3 with mild bone marrow edema and enhancement. There are no specific imaging features to indicate a neoplastic process; but, given history of myeloma, myelomatous involvement is probable. 2. 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 3. Mild C5-6 and C6-7 spinal canal stenosis. 4. Severe right lateral recess and mild left lateral recess narrowing at L3-4. 5. Moderate left L4-5 neural foraminal stenosis. Electronically Signed   By: Deatra Robinson M.D.   On: 01/26/2023 20:05   MR THORACIC SPINE W WO CONTRAST  Result Date: 01/26/2023 CLINICAL DATA:  Metastatic disease evaluation EXAM: MRI CERVICAL, THORACIC AND LUMBAR SPINE WITHOUT AND WITH CONTRAST TECHNIQUE: Multiplanar and multiecho pulse sequences of the cervical spine, to include the craniocervical junction and cervicothoracic junction, and thoracic and lumbar spine, were obtained without and with intravenous contrast. CONTRAST:  6mL GADAVIST GADOBUTROL 1 MMOL/ML IV SOLN COMPARISON:  None Available. FINDINGS: MRI CERVICAL SPINE FINDINGS Alignment: Grade 1 C4-5 anterolisthesis and grade 1 C5-6 retrolisthesis Vertebrae: No fracture, evidence of discitis, or bone lesion. Cord: Normal signal and morphology. Posterior Fossa, vertebral arteries, paraspinal tissues: Negative. Disc levels:  C1-2: Unremarkable. C2-3: Small disc bulge with right-greater-than-left uncovertebral hypertrophy. There is no spinal canal stenosis. Moderate right neural foraminal stenosis. C3-4: Small disc bulge with uncovertebral spurring and right-greater-than-left facet hypertrophy. There is no spinal canal stenosis. Severe right and mild left neural foraminal stenosis. C4-5: Severe right facet arthrosis. There is no spinal canal stenosis. Mild right neural foraminal stenosis. C5-6: Disc bulge with bilateral uncovertebral hypertrophy. Mild spinal canal stenosis. Severe bilateral neural foraminal stenosis. C6-7: Left asymmetric disc bulge with  endplate spurring. Mild spinal canal stenosis. Severe left neural foraminal stenosis. C7-T1: Normal disc space and facet joints. There is no spinal canal stenosis. No neural foraminal stenosis. MRI THORACIC SPINE FINDINGS Alignment:  Physiologic. Vertebrae: There is superior endplate signal abnormalities at T8 and T9, likely minimally depressed compression fractures. There is abnormal signal within the inferior half of T11. Chronic compression deformity of T12 with augmentation cement. There is contrast enhancement at T9, T8 and T11 but no visualized soft tissue component. Cord:  Normal signal and morphology. Paraspinal and other soft tissues: Negative. Disc levels: No spinal canal or neural foraminal stenosis. MRI LUMBAR SPINE FINDINGS Segmentation:  Standard. Alignment:  Physiologic. Vertebrae: Compression deformities at L1 and L3 with mild bone marrow edema. No soft tissue mass. Conus medullaris and cauda equina: Conus extends to the L1 level. Conus and cauda equina appear normal. Paraspinal and other soft tissues: Negative Disc levels: L1-L2: Normal disc space and facet joints. No spinal canal stenosis. No neural foraminal stenosis. L2-L3: Small disc bulge with mild facet hypertrophy. No spinal canal stenosis. No neural foraminal stenosis. L3-L4: Right asymmetric disc bulge. Mild spinal canal stenosis. Severe narrowing of the right lateral recess and mild left lateral recess narrowing. Mild right neural foraminal stenosis. L4-L5: Small disc bulge with endplate spurring. Right lateral recess narrowing without central spinal canal stenosis. Mild right and moderate left neural foraminal stenosis. L5-S1: Normal disc space and facet joints. No spinal canal stenosis. No neural foraminal stenosis. Visualized sacrum: Normal. IMPRESSION: 1. Acute to subacute compression fractures of T8, T9, L1 and L3 with mild bone marrow edema and enhancement. There are no specific imaging features to indicate a neoplastic process; but,  given history of myeloma, myelomatous involvement is probable. 2. 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 3. Mild C5-6 and C6-7 spinal canal stenosis. 4. Severe right lateral recess and mild left lateral recess narrowing at L3-4. 5. Moderate left L4-5 neural foraminal stenosis. Electronically Signed   By: Deatra Robinson M.D.   On: 01/26/2023 20:05   CT ABDOMEN PELVIS W CONTRAST  Result Date: 01/26/2023 CLINICAL DATA:  Abdominal pain.  Weakness. EXAM: CT ABDOMEN AND PELVIS WITH CONTRAST TECHNIQUE: Multidetector CT imaging of the abdomen and pelvis was performed using the standard protocol following bolus administration of intravenous contrast. RADIATION DOSE REDUCTION: This exam was performed according to the departmental dose-optimization program which includes automated exposure control, adjustment of the mA and/or kV according to patient size and/or use of iterative reconstruction technique. CONTRAST:  80mL OMNIPAQUE IOHEXOL 300 MG/ML  SOLN COMPARISON:  08/03/2021. FINDINGS: Lower chest: No acute findings. 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 Hepatobiliary: Liver normal in size. 2 cysts in segment 2, smaller than on the prior CT. Largest measures 2 cm. No other liver masses or lesions. Status post cholecystectomy. No bile duct dilation. Pancreas: Unremarkable. No pancreatic ductal dilatation or surrounding inflammatory changes. Spleen: Normal in size without focal abnormality. Adrenals/Urinary Tract: Normal adrenal glands. Kidneys normal in size,  orientation and position with symmetric enhancement and excretion. 1.8 cm posterior midpole right renal cyst, stable with no follow-up indicated. No other renal masses. Mild bilateral hydronephrosis and hydroureter. No renal or ureteral stones. Bladder is distended. No bladder wall thickening, mass or stone. Stomach/Bowel: Small hiatal hernia.  Stomach otherwise unremarkable. Small bowel and colon  are normal in caliber. No wall thickening or inflammation. Mild to moderate increase in the colonic stool burden. Rectum moderately distended with stool. Vascular/Lymphatic: Aortic atherosclerosis. No aneurysm. No enlarged lymph nodes. Reproductive: Unremarkable. Other: No ascites.  No hernia. Musculoskeletal: There are compression fractures of T12, L1 and L3, T12 previously treated with vertebroplasty. These fractures are new since the prior CT. Numerous small lucencies are seen throughout the visualized skeleton. Findings consistent with multiple myeloma. PET-CT performed on 12/25/2022 has an indication of initial treatment strategy for multiple myeloma. IMPRESSION: 1. There are vertebral fractures as detailed all new since the CT from 08/03/2021. Only the T12 fracture was evident on an MRI from 11/28/2022. The L1 and L3 fractures may be recent. There are underlying numerous small lucencies throughout the visualized skeleton consistent with multiple myeloma. 2. Significant bladder distension, which is the presumed cause of mild bilateral hydroureteronephrosis. 3. No other acute abnormality within the abdomen or pelvis. 4. Increased colonic stool burden and rectum moderately distended with stool. 5. Aortic atherosclerosis. Electronically Signed   By: Amie Portland M.D.   On: 01/26/2023 16:41

## 2023-02-14 ENCOUNTER — Telehealth: Payer: Self-pay

## 2023-02-14 NOTE — Telephone Encounter (Signed)
CSW attempted to contact patient's wife, [name], to follow up.  Left message.

## 2023-02-18 ENCOUNTER — Telehealth: Payer: Medicare HMO | Admitting: Hospice and Palliative Medicine

## 2023-02-19 ENCOUNTER — Ambulatory Visit: Payer: Self-pay | Admitting: Urology

## 2023-02-20 ENCOUNTER — Ambulatory Visit: Payer: Self-pay | Admitting: Urology

## 2023-02-20 ENCOUNTER — Ambulatory Visit: Payer: Medicare HMO | Admitting: Radiation Oncology

## 2023-02-20 ENCOUNTER — Ambulatory Visit: Payer: Self-pay | Admitting: Physician Assistant

## 2023-02-20 ENCOUNTER — Ambulatory Visit: Payer: Medicare HMO | Admitting: Physician Assistant

## 2023-02-20 ENCOUNTER — Ambulatory Visit: Payer: Medicare HMO | Admitting: Urology

## 2023-02-20 VITALS — Ht 71.0 in | Wt 132.0 lb

## 2023-02-20 VITALS — BP 116/70

## 2023-02-20 DIAGNOSIS — R339 Retention of urine, unspecified: Secondary | ICD-10-CM

## 2023-02-20 DIAGNOSIS — N419 Inflammatory disease of prostate, unspecified: Secondary | ICD-10-CM

## 2023-02-20 LAB — BLADDER SCAN AMB NON-IMAGING: Scan Result: 161

## 2023-02-20 MED ORDER — CIPROFLOXACIN HCL 500 MG PO TABS
500.0000 mg | ORAL_TABLET | Freq: Once | ORAL | Status: AC
Start: 2023-02-20 — End: 2023-02-20
  Administered 2023-02-20: 500 mg via ORAL

## 2023-02-20 NOTE — Progress Notes (Signed)
Afternoon follow-up  Patient returned to clinic this afternoon for PVR. He has voided twice without difficulty. PVR .  Results for orders placed or performed in visit on 02/20/23  BLADDER SCAN AMB NON-IMAGING  Result Value Ref Range   Scan Result 161 ml     Voiding trial passed. Counseled patient to follow up with Dr. Lonna Cobb in 1 month for repeat PVR; sooner if needed..  Follow up: Return in about 4 weeks (around 03/20/2023) for Symptom recheck with PVR.

## 2023-02-20 NOTE — Progress Notes (Signed)
I,Melvin Gilmore,acting as a scribe for Melvin Altes, MD.,have documented all relevant documentation on the behalf of Melvin Altes, MD,as directed by  Melvin Altes, MD while in the presence of Melvin Altes, MD.  02/20/2023 9:12 AM   Melvin Gilmore 1940-11-23 [id number]  Referring provider: [name], MD [address] [facility] [place],  [place] [address]  Chief Complaint  Patient presents with   New Patient (Initial Visit)    Voiding trial     HPI: Melvin Gilmore is a 82 y.o. male presents for evaluation of urinary retention and a voiding trial.  Admitted 01/26/23 with a history of constipation and subsequent watery diarrhea. Found to be in urinary retention and a Foley catheter was placed with 1.3 L of urine obtained upon catheter replacement. Past medical history significant for multiple myeloma, followed by oncology. History of radiation and multiple pathologic spinal fractures, status post kyphoplasty. He was evaluated by neurosurgery who did not feel his retention was neurogenic in etiology. Previously followed by Dr. 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 Prostate biopsy performed 01/03/22 for a PSA of 8.53 and MRI showing a PI-RADS5 lesion at the left anterior transition zone. Fusion biopsy return Gleason 3+4 adenocarcinoma on the ROI cores. He underwent HIFU by Dr. Evelene Gilmore 02/07/22. Last PSA May 2024 was 3.44. He had no voiding problems prior to his hospital admission.    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   Nausea vomiting and diarrhea 01/26/2023   Prostate cancer (HCC) 2023   Seasonal allergies    Skin cancer    a.) ears   Wears partial dentures    lower    Surgical History: Past Surgical History:  Procedure Laterality Date   APPENDECTOMY     CATARACT EXTRACTION W/PHACO Right 01/15/2016   Procedure: CATARACT EXTRACTION PHACO AND INTRAOCULAR LENS PLACEMENT (IOC);  Surgeon: Melvin Hess, MD;  [facility];  Service: Ophthalmology;  Laterality: Right;  RIGHT CALL CELL PHONE WITH TIME   CHOLECYSTECTOMY     COLONOSCOPY     COLONOSCOPY WITH ESOPHAGOGASTRODUODENOSCOPY (EGD)     ESOPHAGOGASTRODUODENOSCOPY  07/2021   ESOPHAGOGASTRODUODENOSCOPY (EGD) WITH PROPOFOL N/A 09/23/2017   Procedure: ESOPHAGOGASTRODUODENOSCOPY (EGD) WITH PROPOFOL;  Surgeon: Melvin, Boykin Nearing, MD;  Location: [facility];  Service: Gastroenterology;  Laterality: N/A;   HIGH INTENSITY FOCUSED ULTRASOUND (HIFU) OF THE PROSTATE N/A 02/07/2022   Procedure: HIGH INTENSITY FOCUSED ULTRASOUND (HIFU) OF THE PROSTATE;  Surgeon: Melvin Ape, MD;  Location: [facility];  Service: Urology;  Laterality: N/A;   HYDROCELE EXCISION / REPAIR     IR KYPHO THORACIC WITH BONE BIOPSY  12/06/2022   IR RADIOLOGIST EVAL & MGMT  11/21/2022   LASIX RT EYE     PROSTATE BIOPSY N/A 01/03/2022   Procedure: PROSTATE BIOPSY  Melvin Gilmore;  Surgeon: Melvin Ape, MD;  Location: [facility];  Service: Urology;  Laterality: N/A;  ROTATOR CUFF REPAIR Left 2001    Home Medications:  Allergies as of 02/20/2023       Reactions   Amoxicillin-pot Clavulanate Rash, Other (See Comments)   Pt states he can't recall what the reactions were   Meloxicam Rash, Other (See Comments)        Medication List        Accurate as of February 20, 2023  9:12 AM. If you have any questions, ask your nurse or doctor.          acetaminophen 500 MG tablet Commonly known as: TYLENOL Take 500 mg by mouth every 6  (six) hours as needed for mild pain, moderate pain, fever or headache.   acyclovir 400 MG tablet Commonly known as: ZOVIRAX Take 400 mg by mouth 2 (two) times daily.   ALPRAZolam 0.25 MG tablet Commonly known as: XANAX Take 1 tablet (0.25 mg total) by mouth at bedtime as needed for anxiety.   aspirin EC 81 MG tablet Take 1 tablet (81 mg total) by mouth daily. Swallow whole.   cetirizine 10 MG tablet Commonly known as: ZYRTEC Take 10 mg by mouth as needed for allergies.   docusate sodium 100 MG capsule Commonly known as: COLACE Take 2 capsules (200 mg total) by mouth 2 (two) times daily. What changed:  when to take this reasons to take this   fluticasone 50 MCG/ACT nasal spray Commonly known as: FLONASE Place 1 spray into both nostrils daily as needed for rhinitis or allergies.   gabapentin 100 MG capsule Commonly known as: Neurontin Take 1 capsule (100 mg total) by mouth 3 (three) times daily. What changed:  when to take this reasons to take this   lactulose 10 GM/15ML solution Commonly known as: CHRONULAC Take 10-20 GM (15-30 ml) one - two times a day as needed for constipation   levothyroxine 50 MCG tablet Commonly known as: SYNTHROID Take 50 mcg by mouth daily before breakfast.   naloxone 4 MG/0.1ML Liqd nasal spray kit Commonly known as: NARCAN SPRAY 1 SPRAY INTO ONE NOSTRIL AS DIRECTED FOR OPIOID OVERDOSE (TURN PERSON ON SIDE AFTER DOSE. IF NO RESPONSE IN 2-3 MINUTES OR PERSON RESPONDS BUT RELAPSES, REPEAT USING A NEW SPRAY DEVICE AND SPRAY INTO THE OTHER NOSTRIL. CALL 911 AFTER USE.) * EMERGENCY USE ONLY *   pantoprazole 40 MG tablet Commonly known as: PROTONIX Take 40 mg by mouth daily as needed.   PROBIOTIC ADVANCED PO Take 1 tablet by mouth at bedtime.   senna 8.6 MG tablet Commonly known as: Senokot Take 2 tablets (17.2 mg total) by mouth 2 (two) times daily. May crush, mix with water and give sublingually if needed.   traZODone 50 MG  tablet Commonly known as: DESYREL Take 50 mg by mouth at bedtime as needed.        Allergies:  Allergies  Allergen Reactions   Amoxicillin-Pot Clavulanate Rash and Other (See Comments)    Pt states he can't recall what the reactions were   Meloxicam Rash and Other (See Comments)    Family History: Family History  Problem Relation Age of Onset   Pneumonia Father     Social History:  reports that he has never smoked. He has never used smokeless tobacco. He reports current alcohol use of about 7.0 standard drinks of alcohol per week. He reports that he does not use drugs.   Physical Exam: Ht 5\' 11"  (1.803 m)   Wt 132 lb (59.9 kg)   BMI 18.41 kg/m  Constitutional:  Alert and oriented, No acute distress. HEENT: [place] AT Respiratory: Normal respiratory effort, no increased work of breathing. GU: Foley catheter draining clear urine. Psychiatric: Normal mood and affect.   Assessment & Plan:    1. Urinary retention Catheter removed. Cipro 500 mg PO given at time of catheter removal. He is scheduled for a follow up PVR this afternoon. If no recurrent retention, he will follow up with me in one month for symptom check and repeat PVR.  2. Prostate cancer 1 year status post HIFU for intermediate febrile risk prostate cancer. Check PSA at 1 month follow-up visit  I have reviewed the above documentation for accuracy and completeness, and I agree with the above.   Melvin Altes, MD  [facility] [address] [place], [place] [address] [phone]

## 2023-02-20 NOTE — Progress Notes (Signed)
Catheter Removal  Patient is present today for a catheter removal.  10ml of water was drained from the balloon. A 14FR foley cath was removed from the bladder, no complications were noted. Patient tolerated well.  Performed by: Dorinda Hill ,CMA  Follow up/ Additional notes: Return in about 1 month (around 03/23/2023).

## 2023-02-21 ENCOUNTER — Encounter: Payer: Self-pay | Admitting: Urology

## 2023-02-21 ENCOUNTER — Inpatient Hospital Stay: Payer: Medicare HMO

## 2023-02-21 ENCOUNTER — Inpatient Hospital Stay: Payer: Medicare HMO | Attending: Internal Medicine

## 2023-02-21 ENCOUNTER — Inpatient Hospital Stay (HOSPITAL_BASED_OUTPATIENT_CLINIC_OR_DEPARTMENT_OTHER): Payer: Medicare HMO | Admitting: Internal Medicine

## 2023-02-21 VITALS — BP 116/65 | HR 79 | Temp 98.4°F | Wt 131.0 lb

## 2023-02-21 VITALS — BP 121/60 | HR 61

## 2023-02-21 DIAGNOSIS — I10 Essential (primary) hypertension: Secondary | ICD-10-CM | POA: Diagnosis not present

## 2023-02-21 DIAGNOSIS — N401 Enlarged prostate with lower urinary tract symptoms: Secondary | ICD-10-CM | POA: Diagnosis not present

## 2023-02-21 DIAGNOSIS — Z5112 Encounter for antineoplastic immunotherapy: Secondary | ICD-10-CM

## 2023-02-21 DIAGNOSIS — R4701 Aphasia: Secondary | ICD-10-CM | POA: Insufficient documentation

## 2023-02-21 DIAGNOSIS — C9000 Multiple myeloma not having achieved remission: Secondary | ICD-10-CM | POA: Diagnosis not present

## 2023-02-21 DIAGNOSIS — E785 Hyperlipidemia, unspecified: Secondary | ICD-10-CM | POA: Insufficient documentation

## 2023-02-21 DIAGNOSIS — F419 Anxiety disorder, unspecified: Secondary | ICD-10-CM | POA: Diagnosis not present

## 2023-02-21 DIAGNOSIS — K219 Gastro-esophageal reflux disease without esophagitis: Secondary | ICD-10-CM | POA: Insufficient documentation

## 2023-02-21 DIAGNOSIS — R630 Anorexia: Secondary | ICD-10-CM | POA: Insufficient documentation

## 2023-02-21 DIAGNOSIS — Z8546 Personal history of malignant neoplasm of prostate: Secondary | ICD-10-CM | POA: Diagnosis not present

## 2023-02-21 DIAGNOSIS — E039 Hypothyroidism, unspecified: Secondary | ICD-10-CM | POA: Insufficient documentation

## 2023-02-21 DIAGNOSIS — G893 Neoplasm related pain (acute) (chronic): Secondary | ICD-10-CM

## 2023-02-21 DIAGNOSIS — Z79899 Other long term (current) drug therapy: Secondary | ICD-10-CM | POA: Diagnosis not present

## 2023-02-21 DIAGNOSIS — D649 Anemia, unspecified: Secondary | ICD-10-CM | POA: Diagnosis not present

## 2023-02-21 DIAGNOSIS — R066 Hiccough: Secondary | ICD-10-CM | POA: Diagnosis not present

## 2023-02-21 DIAGNOSIS — K5903 Drug induced constipation: Secondary | ICD-10-CM | POA: Insufficient documentation

## 2023-02-21 LAB — COMPREHENSIVE METABOLIC PANEL
ALT: 32 U/L (ref 0–44)
AST: 32 U/L (ref 15–41)
Albumin: 3.5 g/dL (ref 3.5–5.0)
Alkaline Phosphatase: 91 U/L (ref 38–126)
Anion gap: 8 (ref 5–15)
BUN: 12 mg/dL (ref 8–23)
CO2: 23 mmol/L (ref 22–32)
Calcium: 8.7 mg/dL — ABNORMAL LOW (ref 8.9–10.3)
Chloride: 98 mmol/L (ref 98–111)
Creatinine, Ser: 0.92 mg/dL (ref 0.61–1.24)
GFR, Estimated: >60 mL/min (ref >60)
Glucose, Bld: 113 mg/dL — ABNORMAL HIGH (ref 70–99)
Potassium: 3.3 mmol/L — ABNORMAL LOW (ref 3.5–5.1)
Sodium: 129 mmol/L — ABNORMAL LOW (ref 135–145)
Total Bilirubin: 0.8 mg/dL (ref 0.3–1.2)
Total Protein: 5.8 g/dL — ABNORMAL LOW (ref 6.5–8.1)

## 2023-02-21 LAB — CBC WITH DIFFERENTIAL (CANCER CENTER ONLY)
Abs Immature Granulocytes: 0 10*3/uL (ref 0.00–0.07)
Basophils Absolute: 0 10*3/uL (ref 0.0–0.1)
Basophils Relative: 0 %
Eosinophils Absolute: 0 10*3/uL (ref 0.0–0.5)
Eosinophils Relative: 1 %
HCT: 32.7 % — ABNORMAL LOW (ref 39.0–52.0)
Hemoglobin: 11.6 g/dL — ABNORMAL LOW (ref 13.0–17.0)
Immature Granulocytes: 0 %
Lymphocytes Relative: 63 %
Lymphs Abs: 2.3 10*3/uL (ref 0.7–4.0)
MCH: 34 pg (ref 26.0–34.0)
MCHC: 35.5 g/dL (ref 30.0–36.0)
MCV: 95.9 fL (ref 80.0–100.0)
Monocytes Absolute: 0.2 10*3/uL (ref 0.1–1.0)
Monocytes Relative: 6 %
Neutro Abs: 1.1 10*3/uL — ABNORMAL LOW (ref 1.7–7.7)
Neutrophils Relative %: 30 %
Platelet Count: 111 10*3/uL — ABNORMAL LOW (ref 150–400)
RBC: 3.41 MIL/uL — ABNORMAL LOW (ref 4.22–5.81)
RDW: 15.6 % — ABNORMAL HIGH (ref 11.5–15.5)
WBC Count: 3.6 10*3/uL — ABNORMAL LOW (ref 4.0–10.5)
nRBC: 0 % (ref 0.0–0.2)

## 2023-02-21 MED ORDER — LENALIDOMIDE 2.5 MG PO CAPS: 2.5000 mg | ORAL_CAPSULE | Freq: Every day | ORAL | 0 refills | Status: DC

## 2023-02-21 MED ORDER — DEXAMETHASONE 4 MG PO TABS: 8.0000 mg | ORAL_TABLET | Freq: Every day | ORAL | 0 refills | Status: AC

## 2023-02-21 MED ORDER — DIPHENHYDRAMINE HCL 25 MG PO CAPS
50.0000 mg | ORAL_CAPSULE | Freq: Once | ORAL | Status: AC
  Administered 2023-02-21: 50 mg via ORAL
  Filled 2023-02-21: qty 2

## 2023-02-21 MED ORDER — DARATUMUMAB-HYALURONIDASE-FIHJ 1800-30000 MG-UT/15ML ~~LOC~~ SOLN
1800.0000 mg | Freq: Once | SUBCUTANEOUS | Status: AC
  Administered 2023-02-21: 1800 mg via SUBCUTANEOUS
  Filled 2023-02-21: qty 15

## 2023-02-21 MED ORDER — POTASSIUM CHLORIDE CRYS ER 20 MEQ PO TBCR: 20.0000 meq | EXTENDED_RELEASE_TABLET | Freq: Every day | ORAL | 0 refills | Status: DC

## 2023-02-21 MED ORDER — ACETAMINOPHEN 325 MG PO TABS
650.0000 mg | ORAL_TABLET | Freq: Once | ORAL | Status: AC
  Administered 2023-02-21: 650 mg via ORAL
  Filled 2023-02-21: qty 2

## 2023-02-21 NOTE — Progress Notes (Signed)
Nutrition Follow-up:  Patient with multiple myeloma.  Patient receiving chemotherapy.   Met with patient and wife during infusion.  Wife received and reviewed taste change handouts given last visit to patient.  She has been trying to provide foods that patient can taste ([place] flavors, V-8 juice, spicy, [place] flavors).      Medications: reviewed  Labs: reviewed  Anthropometrics:   Weight 131 lb today  133 lb on 7/25 136 lb 8 oz on 6/20 145 lb on 6/6   NUTRITION DIAGNOSIS: Inadequate oral intake continues   INTERVENTION:  Wife will continue utilizing taste change information Has gotten tired of oral nutrition supplements.  Making homemade milkshakes instead at this time    MONITORING, EVALUATION, GOAL: weight trends, intake    NEXT VISIT: as needed   B. Freida Busman, RD, LDN Registered Dietitian [phone]

## 2023-02-21 NOTE — Patient Instructions (Signed)
[facility]  Discharge Instructions: Thank you for choosing [facility] to provide your oncology and hematology care.  If you have a lab appointment with the Cancer Center, please go directly to the Cancer Center and check in at the registration area.  Wear comfortable clothing and clothing appropriate for easy access to any Portacath or PICC line.   We strive to give you quality time with your provider. You may need to reschedule your appointment if you arrive late (15 or more minutes).  Arriving late affects you and other patients whose appointments are after yours.  Also, if you miss three or more appointments without notifying the office, you may be dismissed from the clinic at the provider's discretion.      For prescription refill requests, have your pharmacy contact our office and allow 72 hours for refills to be completed.    Today you received the following chemotherapy and/or immunotherapy agents darzalex SQ   To help prevent nausea and vomiting after your treatment, we encourage you to take your nausea medication as directed.  BELOW ARE SYMPTOMS THAT SHOULD BE REPORTED IMMEDIATELY: *FEVER GREATER THAN 100.4 F (38 C) OR HIGHER *CHILLS OR SWEATING *NAUSEA AND VOMITING THAT IS NOT CONTROLLED WITH YOUR NAUSEA MEDICATION *UNUSUAL SHORTNESS OF BREATH *UNUSUAL BRUISING OR BLEEDING *URINARY PROBLEMS (pain or burning when urinating, or frequent urination) *BOWEL PROBLEMS (unusual diarrhea, constipation, pain near the anus) TENDERNESS IN MOUTH AND THROAT WITH OR WITHOUT PRESENCE OF ULCERS (sore throat, sores in mouth, or a toothache) UNUSUAL RASH, SWELLING OR PAIN  UNUSUAL VAGINAL DISCHARGE OR ITCHING   Items with * indicate a potential emergency and should be followed up as soon as possible or go to the Emergency Department if any problems should occur.  Please show the CHEMOTHERAPY ALERT CARD or IMMUNOTHERAPY ALERT CARD at check-in to  the Emergency Department and triage nurse.  Should you have questions after your visit or need to cancel or reschedule your appointment, please contact [facility]  [phone] and follow the prompts.  Office hours are 8:00 a.m. to 4:30 p.m. Monday - Friday. Please note that voicemails left after 4:00 p.m. may not be returned until the following business day.  We are closed weekends and major holidays. You have access to a nurse at all times for urgent questions. Please call the main number to the clinic [phone] and follow the prompts.  For any non-urgent questions, you may also contact your provider using MyChart. We now offer e-Visits for anyone 39 and older to request care online for non-urgent symptoms. For details visit [email or web address].   Also download the MyChart app! Go to the app store, search "MyChart", open the app, select [place], and log in with your MyChart username and password.

## 2023-02-21 NOTE — Progress Notes (Signed)
[facility] CONSULT NOTE  Patient Care Team: [name], MD as PCP - General (Internal Medicine) [name], MD as Consulting Physician (Oncology)  CANCER STAGING   Cancer Staging  Multiple myeloma [facility]) Staging form: Plasma Cell Myeloma and Plasma Cell Disorders, AJCC 8th Edition - Clinical stage from 12/05/2022: RISS Stage II (Beta-2-microglobulin (mg/L): 3.7, Albumin (g/dL): 3.6, ISS: Stage II, High-risk cytogenetics: Absent, LDH: Normal) - Signed by [name], MD on 12/26/2022 Stage prefix: Initial diagnosis Beta 2 microglobulin range (mg/L): 3.5 to 5.49 Albumin range (g/dL): Greater than or equal to 3.5 Cytogenetics: 1q addition, Other mutation   ASSESSMENT & PLAN:  Melvin Gilmore 82 y.o. male with pmh of hypertension, hyperlipidemia, anxiety, GERD, BPH, hypothyroidism, prostate cancer s/p HIFU was seen by primary on November 13, 2022 for acute onset of lower back pain.  Was referred to medical oncology for finding of L1 lesion suspicious for metastasis.  # Multiple myeloma, RISS Stage II -MRI thoracic and lumbar spine with and without contrast was reviewed. Showed unchanged subacute compression fracture at T12.  Focal enhancing marrow replacing lesions in T6, T8, T5, T2 all suspicious for metastatic disease.  L1 lesion measuring 16 mm.  Multilevel degenerative changes present.  -SPEP/IFE showed biclonal IgA kappa paraprotein with M spike 2.1 g/dL and second spike at 0.4 g/dL.  Kappa 453, lambda 6.8 with a ratio of 66.7.  Iron panel and B12 are normal.  PSA 3.44.  CBC showed mild anemia 13.4, chronic.  On CMP normal creatinine and calcium levels.  Elevated total protein 8.8.  LDH normal.  Beta-2 microglobulin 3.7.  Albumin 3.6.  24-hour urine/UPEP showed 1.1 g protein with no M spike.  -BMBx Hypercellular marrow with 60% plasma cells.  IHC positive for CD138/ mum 1.  Kappa restricted consistent with multiple myeloma. L1 vertebral body biopsy showed numerous  plasma cells. Cytogenetics - del (13q) and gain 1.    -PET/CT showed activity in L1 and left eighth rib concerning for myeloma.  -Started on Dara-RD on 12/26/2022.  Completed cycle 1 of daratumumab -> then hospitalized for urinary retention, constipation and back pain -> inpatient hospitalist team discussed with patient and transition him to hospice -> patient revoked hospice on 01/31/2023 since he was feeling better.  -Labs reviewed.  WBC 3.6, ANC 1.1 noted.  Will proceed with cycle 2-day 8 of daratumumab.  Hold off on dexamethasone 20 mg due to intractable hiccups.  I discussed with the patient about doing split dose of dexamethasone.  Take 2 tablets on day 1 and 2 tablets on day 2 to make a total of 16 mg dose and assess if he is able to tolerate better.  -Revlimid 10 mg could not tolerate due to neutropenia. Dose reduced to 5 mg 3 weeks on 1 week off.  ANC is down to 1.1 today.  He has been very sensitive to Revlimid and I am concerned that next week his ANC will go below 1000.  I advised him to take last dose today and do 2 weeks on and 2 weeks off.  With the next cycle I will decrease the dose to 2.5 mg 3 weeks on 1 week off which I think will be the better dose for him.  -Showing response to treatment.  Kappa chains decreasing and M spike decreased from 2.1 to 0.2.   # Acute urinary retention likely secondary to stool burden -Follows up with Dr. Lonna Cobb.  Catheter removed.  # Intractable hiccups -Secondary to dexamethasone. -Will  try regimen of 8 mg on day 1 and 2 and see if he tolerates better.  # Vertebral compression fractures - Advised to obtain dental clearance prior to starting Zometa  # T12 pathological fracture # Cancer-related pain - s/p kyphoplasty by Dr. Elijio Miles on 12/06/22.  No improvement in pain -Completed palliative RT on 01/20/2023.  Denies any improvement yet. -Patient started on MS Contin 15 mg twice daily.  But only taking at night and mentions pain is better  controlled.  # Opioid-induced constipation -Manageable with senna, MiraLAX, lactulose, Movantik as needed.  # History of prostate cancer - follows with Dr. Evelene Croon, urology for elevated PSA.  MRI prostate showed 1 mm category 5 lesion of anterior transition zone.  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 s/p HIFU of the prostate on 02/07/2022.  PSA level from 10/08/2022 was 3.34.  -PSA from 11/21/2022 3.44.  # Poor appetite # Weight loss -Could be from cancer and uncontrolled pain.  Pain management as above. Nutrition following. -He is taking Marinol 2.5 mg once daily.   # Anxiety -On longstanding Xanax 0.25 mg at night as needed.  -Anxiety has been heightened with the recent diagnosis, pain, poor appetite.  Orders Placed This Encounter  Procedures   CBC with Differential (Cancer Center Only)    Standing Status:   Future    Standing Expiration Date:   02/28/2024   RTC in 1 week for MD visit, labs, Dara  The total time spent in the appointment was 30 minutes encounter with patients including review of chart and various tests results, discussions about plan of care and coordination of care plan   All questions were answered. The patient knows to call the clinic with any problems, questions or concerns. No barriers to learning was detected.  [name], MD 8/2/202410:42 AM   HISTORY OF PRESENTING ILLNESS:  Melvin Gilmore 82 y.o. male with past medical history of hypertension, hyperlipidemia, anxiety, GERD, BPH, hypothyroidism, prostate cancer s/p HIFU was seen by primary on November 13, 2022 for acute onset of lower back pain.  Patient reports that he was sitting outside and had violent sneeze which was followed by severe back pain.  He has fallen allergies.  Had x-rays followed by MRI thoracic spine without contrast.  It showed compression fracture of T12 superior endplate with surrounding marrow edema likely subacute.  15 mm T1  hypointense marrow lesion in the anterior aspect of L1 suspicious for metastasis.  Further characterization with postcontrast MRI recommended.  Prostate cancer-follows with Dr. Evelene Croon, urology for elevated PSA.  MRI prostate showed 1 mm category 5 lesion of anterior transition zone.  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 s/p HIFU of the prostate on 02/07/2022.  PSA level from 10/08/2022 was 3.34.  Interval history Patient was seen today prior to cycle 2-day 8 of daratumumab infusion and labs.  His pain is improved in the back.  Has been having some burning sensation with urination post catheter removal.  Was advised to take Azo by urology.  Has been feeling nauseous this morning.  Continues to have issues with constipation.  Takes senna on regular basis.  Lactulose as needed which causes diarrhea but still does not feel has good complete emptying of the stool.  We did discuss about trying Movantik instead of lactulose and see how he feels.  I have reviewed his chart and materials related to his cancer extensively and collaborated history with  the patient. Summary of oncologic history is as follows: Oncology History  Multiple myeloma (HCC)  12/05/2022 Cancer Staging   Staging form: Plasma Cell Myeloma and Plasma Cell Disorders, AJCC 8th Edition - Clinical stage from 12/05/2022: RISS Stage II (Beta-2-microglobulin (mg/L): 3.7, Albumin (g/dL): 3.6, ISS: Stage II, High-risk cytogenetics: Absent, LDH: Normal) - Signed by [name], MD on 12/26/2022 Stage prefix: Initial diagnosis Beta 2 microglobulin range (mg/L): 3.5 to 5.49 Albumin range (g/dL): Greater than or equal to 3.5 Cytogenetics: 1q addition, Other mutation   12/13/2022 Initial Diagnosis   Multiple myeloma (HCC)   12/26/2022 - 01/24/2023 Chemotherapy   Patient is on Treatment Plan : MYELOMA NEWLY DIAGNOSED Daratumumab IV + Lenalidomide + Dexamethasone Weekly (DaraRd) q28d      02/07/2023 - 02/07/2023 Chemotherapy   Patient is on Treatment Plan : MYELOMA NON-TRANSPLANT CANDIDATES VRd weekly q21d     02/13/2023 -  Chemotherapy   Patient is on Treatment Plan : MYELOMA NEWLY DIAGNOSED Daratumumab IV + Lenalidomide + Dexamethasone Weekly (DaraRd) q28d       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   Nausea vomiting and diarrhea 01/26/2023   Prostate cancer (HCC) 2023   Seasonal allergies    Skin cancer    a.) ears   Wears partial dentures    lower    SURGICAL HISTORY: Past Surgical History:  Procedure Laterality Date   APPENDECTOMY     CATARACT EXTRACTION W/PHACO Right 01/15/2016   Procedure: CATARACT EXTRACTION PHACO AND INTRAOCULAR LENS PLACEMENT (IOC);  Surgeon: Sherald Hess, MD;  Location: [facility];  Service: Ophthalmology;  Laterality: Right;  RIGHT CALL CELL PHONE WITH TIME   CHOLECYSTECTOMY     COLONOSCOPY     COLONOSCOPY WITH ESOPHAGOGASTRODUODENOSCOPY (EGD)     ESOPHAGOGASTRODUODENOSCOPY  07/2021   ESOPHAGOGASTRODUODENOSCOPY (EGD) WITH PROPOFOL N/A 09/23/2017   Procedure: ESOPHAGOGASTRODUODENOSCOPY (EGD) WITH PROPOFOL;  Surgeon: Toledo, Boykin Nearing, MD;  Location: [facility];  Service: Gastroenterology;  Laterality: N/A;   HIGH INTENSITY FOCUSED ULTRASOUND (HIFU) OF THE PROSTATE N/A 02/07/2022   Procedure: HIGH INTENSITY FOCUSED ULTRASOUND (HIFU) OF THE PROSTATE;  Surgeon: Orson Ape, MD;  Location: [facility];  Service: Urology;  Laterality: N/A;   HYDROCELE EXCISION /  REPAIR     IR KYPHO THORACIC WITH BONE BIOPSY  12/06/2022   IR RADIOLOGIST EVAL & MGMT  11/21/2022   LASIX RT EYE     PROSTATE BIOPSY N/A 01/03/2022   Procedure: PROSTATE BIOPSY  Addison Wedel;  Surgeon: Orson Ape, MD;  Location: [facility];  Service: Urology;  Laterality: N/A;   ROTATOR CUFF REPAIR Left 2001    SOCIAL HISTORY: Social History   Socioeconomic History   Marital status: Married    Spouse name: Not on file   Number of children: Not on file   Years of education: Not on file   Highest education level: Not on file  Occupational History   Not on file  Tobacco Use   Smoking status: Never   Smokeless tobacco: Never  Vaping Use   Vaping status: Never Used  Substance and Sexual Activity   Alcohol use: Yes    Alcohol/week: 7.0 standard drinks of alcohol    Types: 7 Glasses of wine per week    Comment: wine occ   Drug use: No   Sexual activity: Not on file  Other Topics Concern   Not on file  Social History Narrative   Not on file   Social Determinants of Health   Financial Resource Strain: Low Risk  (11/21/2022)   Overall Financial Resource Strain (CARDIA)    Difficulty of Paying Living Expenses: Not hard at all  Food Insecurity: No Food Insecurity (01/26/2023)   Hunger Vital Sign    Worried About Running Out of Food in the Last Year: Never true    Ran Out of Food in the Last Year: Never true  Transportation Needs: No Transportation Needs (01/26/2023)   PRAPARE - Administrator, Civil Service (Medical): No    Lack of Transportation (Non-Medical): No  Physical Activity: Not on file  Stress: Not on file  Social Connections: Not on file  Intimate Partner Violence: Not At Risk (01/26/2023)   Humiliation, Afraid, Rape, and Kick questionnaire    Fear of Current or Ex-Partner: No    Emotionally Abused: No    Physically Abused: No    Sexually Abused: No    FAMILY HISTORY: Family History  Problem Relation Age of Onset   Pneumonia Father     ALLERGIES:  is  allergic to amoxicillin-pot clavulanate and meloxicam.  MEDICATIONS:  Current Outpatient Medications  Medication Sig Dispense Refill   acetaminophen (TYLENOL) 500 MG tablet Take 500 mg by mouth every 6 (six) hours as needed for mild pain, moderate pain, fever or headache.     acyclovir (ZOVIRAX) 400 MG tablet Take 400 mg by mouth 2 (two) times daily.     ALPRAZolam (XANAX) 0.25 MG tablet Take 1 tablet (0.25 mg total) by mouth at bedtime as needed for anxiety. 30 tablet 0   aspirin EC 81 MG tablet Take 1 tablet (81 mg total) by mouth daily. Swallow whole. 90 tablet 2   cetirizine (ZYRTEC) 10 MG tablet Take 10 mg by mouth as needed for allergies.     docusate sodium (COLACE) 100 MG capsule Take 2 capsules (200 mg total) by mouth 2 (two) times daily. (Patient taking differently: Take 200 mg by mouth daily as needed for moderate constipation or mild constipation.) 120 capsule 3   fluticasone (FLONASE) 50 MCG/ACT nasal spray Place 1 spray into both nostrils daily as needed for rhinitis or allergies.     gabapentin (NEURONTIN) 100 MG capsule Take 1 capsule (100 mg total) by mouth 3 (three) times daily. (Patient taking differently: Take 100 mg by mouth daily as needed.) 30 capsule 0   lactulose (CHRONULAC) 10 GM/15ML solution Take 10-20 GM (15-30 ml) one - two times a day as needed for constipation 475 mL 0   levothyroxine (SYNTHROID, LEVOTHROID) 50 MCG tablet Take 50 mcg by mouth daily before breakfast.     naloxone (NARCAN) nasal spray 4 mg/0.1 mL SPRAY 1 SPRAY INTO ONE NOSTRIL AS DIRECTED FOR OPIOID OVERDOSE (TURN PERSON ON SIDE AFTER DOSE. IF NO RESPONSE IN 2-3 MINUTES OR PERSON RESPONDS BUT RELAPSES, REPEAT USING A NEW SPRAY DEVICE AND SPRAY INTO THE OTHER NOSTRIL. CALL 911 AFTER USE.) * EMERGENCY USE ONLY * 1 each 0  pantoprazole (PROTONIX) 40 MG tablet Take 40 mg by mouth daily as needed.     Probiotic Product (PROBIOTIC ADVANCED PO) Take 1 tablet by mouth at bedtime.     senna (SENOKOT) 8.6 MG  tablet Take 2 tablets (17.2 mg total) by mouth 2 (two) times daily. May crush, mix with water and give sublingually if needed. 28 tablet 0   traZODone (DESYREL) 50 MG tablet Take 50 mg by mouth at bedtime as needed.     No current facility-administered medications for this visit.   Facility-Administered Medications Ordered in Other Visits  Medication Dose Route Frequency Provider Last Rate Last Admin   daratumumab-hyaluronidase-fihj (DARZALEX FASPRO) 1800-30000 MG-UT/15ML chemo SQ injection 1,800 mg  1,800 mg Subcutaneous Once [name], MD        REVIEW OF SYSTEMS:   Pertinent information mentioned in HPI All other systems were reviewed with the patient and are negative.  PHYSICAL EXAMINATION: ECOG PERFORMANCE STATUS: 1 - Symptomatic but completely ambulatory  Vitals:   02/21/23 0935  BP: 116/65  Pulse: 79  Temp: 98.4 F (36.9 C)  SpO2: 97%     Filed Weights   02/21/23 0935  Weight: 131 lb (59.4 kg)      GENERAL:alert, no distress and comfortable SKIN: skin color, texture, turgor are normal, no rashes or significant lesions EYES: normal, conjunctiva are pink and non-injected, sclera clear OROPHARYNX:no exudate, no erythema and lips, buccal mucosa, and tongue normal  NECK: supple, thyroid normal size, non-tender, without nodularity LYMPH:  no palpable lymphadenopathy in the cervical, axillary or inguinal LUNGS: clear to auscultation and percussion with normal breathing effort HEART: regular rate & rhythm and no murmurs and no lower extremity edema ABDOMEN:abdomen soft, non-tender and normal bowel sounds Musculoskeletal:no cyanosis of digits and no clubbing  PSYCH: alert & oriented x 3 with fluent speech NEURO: no focal motor/sensory deficits  LABORATORY DATA:  I have reviewed the data as listed Lab Results  Component Value Date   WBC 3.6 (L) 02/21/2023   HGB 11.6 (L) 02/21/2023   HCT 32.7 (L) 02/21/2023   MCV 95.9 02/21/2023   PLT 111 (L) 02/21/2023    Recent Labs    01/26/23 1221 01/26/23 2147 01/28/23 0546 02/07/23 1141 02/21/23 0916  NA 123*   < > 126* 125* 129*  K 3.9   < > 3.8 3.7 3.3*  CL 93*   < > 93* 93* 98  CO2 20*   < > 25 23 23   GLUCOSE 111*   < > 104* 130* 113*  BUN 17   < > 11 14 12   CREATININE 0.87   < > 0.73 0.85 0.92  CALCIUM 8.1*   < > 8.2* 8.4* 8.7*  GFRNONAA >60   < > >60 >60 >60  PROT 6.2*  --   --  6.3* 5.8*  ALBUMIN 3.7  --   --  3.5 3.5  AST 23  --   --  32 32  ALT 28  --   --  31 32  ALKPHOS 127*  --   --  103 91  BILITOT 1.4*  --   --  1.0 0.8   < > = values in this interval not displayed.    RADIOGRAPHIC STUDIES: I have personally reviewed the radiological images as listed and agreed with the findings in the report. MR CERVICAL SPINE W WO CONTRAST  Result Date: 01/26/2023 CLINICAL DATA:  Metastatic disease evaluation EXAM: MRI CERVICAL, THORACIC AND LUMBAR SPINE WITHOUT AND WITH CONTRAST  TECHNIQUE: Multiplanar and multiecho pulse sequences of the cervical spine, to include the craniocervical junction and cervicothoracic junction, and thoracic and lumbar spine, were obtained without and with intravenous contrast. CONTRAST:  6mL GADAVIST GADOBUTROL 1 MMOL/ML IV SOLN COMPARISON:  None Available. FINDINGS: MRI CERVICAL SPINE FINDINGS Alignment: Grade 1 C4-5 anterolisthesis and grade 1 C5-6 retrolisthesis Vertebrae: No fracture, evidence of discitis, or bone lesion. Cord: Normal signal and morphology. Posterior Fossa, vertebral arteries, paraspinal tissues: Negative. Disc levels: C1-2: Unremarkable. C2-3: Small disc bulge with right-greater-than-left uncovertebral hypertrophy. There is no spinal canal stenosis. Moderate right neural foraminal stenosis. C3-4: Small disc bulge with uncovertebral spurring and right-greater-than-left facet hypertrophy. There is no spinal canal stenosis. Severe right and mild left neural foraminal stenosis. C4-5: Severe right facet arthrosis. There is no spinal canal stenosis. Mild  right neural foraminal stenosis. C5-6: Disc bulge with bilateral uncovertebral hypertrophy. Mild spinal canal stenosis. Severe bilateral neural foraminal stenosis. C6-7: Left asymmetric disc bulge with endplate spurring. Mild spinal canal stenosis. Severe left neural foraminal stenosis. C7-T1: Normal disc space and facet joints. There is no spinal canal stenosis. No neural foraminal stenosis. MRI THORACIC SPINE FINDINGS Alignment:  Physiologic. Vertebrae: There is superior endplate signal abnormalities at T8 and T9, likely minimally depressed compression fractures. There is abnormal signal within the inferior half of T11. Chronic compression deformity of T12 with augmentation cement. There is contrast enhancement at T9, T8 and T11 but no visualized soft tissue component. Cord:  Normal signal and morphology. Paraspinal and other soft tissues: Negative. Disc levels: No spinal canal or neural foraminal stenosis. MRI LUMBAR SPINE FINDINGS Segmentation:  Standard. Alignment:  Physiologic. Vertebrae: Compression deformities at L1 and L3 with mild bone marrow edema. No soft tissue mass. Conus medullaris and cauda equina: Conus extends to the L1 level. Conus and cauda equina appear normal. Paraspinal and other soft tissues: Negative Disc levels: L1-L2: Normal disc space and facet joints. No spinal canal stenosis. No neural foraminal stenosis. L2-L3: Small disc bulge with mild facet hypertrophy. No spinal canal stenosis. No neural foraminal stenosis. L3-L4: Right asymmetric disc bulge. Mild spinal canal stenosis. Severe narrowing of the right lateral recess and mild left lateral recess narrowing. Mild right neural foraminal stenosis. L4-L5: Small disc bulge with endplate spurring. Right lateral recess narrowing without central spinal canal stenosis. Mild right and moderate left neural foraminal stenosis. L5-S1: Normal disc space and facet joints. No spinal canal stenosis. No neural foraminal stenosis. Visualized sacrum:  Normal. IMPRESSION: 1. Acute to subacute compression fractures of T8, T9, L1 and L3 with mild bone marrow edema and enhancement. There are no specific imaging features to indicate a neoplastic process; but, given history of myeloma, myelomatous involvement is probable. 2. 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 3. Mild C5-6 and C6-7 spinal canal stenosis. 4. Severe right lateral recess and mild left lateral recess narrowing at L3-4. 5. Moderate left L4-5 neural foraminal stenosis. Electronically Signed   By: Deatra Robinson M.D.   On: 01/26/2023 20:05   MR Lumbar Spine W Wo Contrast  Result Date: 01/26/2023 CLINICAL DATA:  Metastatic disease evaluation EXAM: MRI CERVICAL, THORACIC AND LUMBAR SPINE WITHOUT AND WITH CONTRAST TECHNIQUE: Multiplanar and multiecho pulse sequences of the cervical spine, to include the craniocervical junction and cervicothoracic junction, and thoracic and lumbar spine, were obtained without and with intravenous contrast. CONTRAST:  6mL GADAVIST GADOBUTROL 1 MMOL/ML IV SOLN COMPARISON:  None Available. FINDINGS: MRI CERVICAL SPINE FINDINGS Alignment: Grade 1 C4-5 anterolisthesis and grade 1 C5-6 retrolisthesis Vertebrae:  No fracture, evidence of discitis, or bone lesion. Cord: Normal signal and morphology. Posterior Fossa, vertebral arteries, paraspinal tissues: Negative. Disc levels: C1-2: Unremarkable. C2-3: Small disc bulge with right-greater-than-left uncovertebral hypertrophy. There is no spinal canal stenosis. Moderate right neural foraminal stenosis. C3-4: Small disc bulge with uncovertebral spurring and right-greater-than-left facet hypertrophy. There is no spinal canal stenosis. Severe right and mild left neural foraminal stenosis. C4-5: Severe right facet arthrosis. There is no spinal canal stenosis. Mild right neural foraminal stenosis. C5-6: Disc bulge with bilateral uncovertebral hypertrophy. Mild spinal canal stenosis. Severe bilateral neural  foraminal stenosis. C6-7: Left asymmetric disc bulge with endplate spurring. Mild spinal canal stenosis. Severe left neural foraminal stenosis. C7-T1: Normal disc space and facet joints. There is no spinal canal stenosis. No neural foraminal stenosis. MRI THORACIC SPINE FINDINGS Alignment:  Physiologic. Vertebrae: There is superior endplate signal abnormalities at T8 and T9, likely minimally depressed compression fractures. There is abnormal signal within the inferior half of T11. Chronic compression deformity of T12 with augmentation cement. There is contrast enhancement at T9, T8 and T11 but no visualized soft tissue component. Cord:  Normal signal and morphology. Paraspinal and other soft tissues: Negative. Disc levels: No spinal canal or neural foraminal stenosis. MRI LUMBAR SPINE FINDINGS Segmentation:  Standard. Alignment:  Physiologic. Vertebrae: Compression deformities at L1 and L3 with mild bone marrow edema. No soft tissue mass. Conus medullaris and cauda equina: Conus extends to the L1 level. Conus and cauda equina appear normal. Paraspinal and other soft tissues: Negative Disc levels: L1-L2: Normal disc space and facet joints. No spinal canal stenosis. No neural foraminal stenosis. L2-L3: Small disc bulge with mild facet hypertrophy. No spinal canal stenosis. No neural foraminal stenosis. L3-L4: Right asymmetric disc bulge. Mild spinal canal stenosis. Severe narrowing of the right lateral recess and mild left lateral recess narrowing. Mild right neural foraminal stenosis. L4-L5: Small disc bulge with endplate spurring. Right lateral recess narrowing without central spinal canal stenosis. Mild right and moderate left neural foraminal stenosis. L5-S1: Normal disc space and facet joints. No spinal canal stenosis. No neural foraminal stenosis. Visualized sacrum: Normal. IMPRESSION: 1. Acute to subacute compression fractures of T8, T9, L1 and L3 with mild bone marrow edema and enhancement. There are no  specific imaging features to indicate a neoplastic process; but, given history of myeloma, myelomatous involvement is probable. 2. 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 3. Mild C5-6 and C6-7 spinal canal stenosis. 4. Severe right lateral recess and mild left lateral recess narrowing at L3-4. 5. Moderate left L4-5 neural foraminal stenosis. Electronically Signed   By: Deatra Robinson M.D.   On: 01/26/2023 20:05   MR THORACIC SPINE W WO CONTRAST  Result Date: 01/26/2023 CLINICAL DATA:  Metastatic disease evaluation EXAM: MRI CERVICAL, THORACIC AND LUMBAR SPINE WITHOUT AND WITH CONTRAST TECHNIQUE: Multiplanar and multiecho pulse sequences of the cervical spine, to include the craniocervical junction and cervicothoracic junction, and thoracic and lumbar spine, were obtained without and with intravenous contrast. CONTRAST:  6mL GADAVIST GADOBUTROL 1 MMOL/ML IV SOLN COMPARISON:  None Available. FINDINGS: MRI CERVICAL SPINE FINDINGS Alignment: Grade 1 C4-5 anterolisthesis and grade 1 C5-6 retrolisthesis Vertebrae: No fracture, evidence of discitis, or bone lesion. Cord: Normal signal and morphology. Posterior Fossa, vertebral arteries, paraspinal tissues: Negative. Disc levels: C1-2: Unremarkable. C2-3: Small disc bulge with right-greater-than-left uncovertebral hypertrophy. There is no spinal canal stenosis. Moderate right neural foraminal stenosis. C3-4: Small disc bulge with uncovertebral spurring and right-greater-than-left facet hypertrophy. There is no spinal canal  stenosis. Severe right and mild left neural foraminal stenosis. C4-5: Severe right facet arthrosis. There is no spinal canal stenosis. Mild right neural foraminal stenosis. C5-6: Disc bulge with bilateral uncovertebral hypertrophy. Mild spinal canal stenosis. Severe bilateral neural foraminal stenosis. C6-7: Left asymmetric disc bulge with endplate spurring. Mild spinal canal stenosis. Severe left neural foraminal stenosis.  C7-T1: Normal disc space and facet joints. There is no spinal canal stenosis. No neural foraminal stenosis. MRI THORACIC SPINE FINDINGS Alignment:  Physiologic. Vertebrae: There is superior endplate signal abnormalities at T8 and T9, likely minimally depressed compression fractures. There is abnormal signal within the inferior half of T11. Chronic compression deformity of T12 with augmentation cement. There is contrast enhancement at T9, T8 and T11 but no visualized soft tissue component. Cord:  Normal signal and morphology. Paraspinal and other soft tissues: Negative. Disc levels: No spinal canal or neural foraminal stenosis. MRI LUMBAR SPINE FINDINGS Segmentation:  Standard. Alignment:  Physiologic. Vertebrae: Compression deformities at L1 and L3 with mild bone marrow edema. No soft tissue mass. Conus medullaris and cauda equina: Conus extends to the L1 level. Conus and cauda equina appear normal. Paraspinal and other soft tissues: Negative Disc levels: L1-L2: Normal disc space and facet joints. No spinal canal stenosis. No neural foraminal stenosis. L2-L3: Small disc bulge with mild facet hypertrophy. No spinal canal stenosis. No neural foraminal stenosis. L3-L4: Right asymmetric disc bulge. Mild spinal canal stenosis. Severe narrowing of the right lateral recess and mild left lateral recess narrowing. Mild right neural foraminal stenosis. L4-L5: Small disc bulge with endplate spurring. Right lateral recess narrowing without central spinal canal stenosis. Mild right and moderate left neural foraminal stenosis. L5-S1: Normal disc space and facet joints. No spinal canal stenosis. No neural foraminal stenosis. Visualized sacrum: Normal. IMPRESSION: 1. Acute to subacute compression fractures of T8, T9, L1 and L3 with mild bone marrow edema and enhancement. There are no specific imaging features to indicate a neoplastic process; but, given history of myeloma, myelomatous involvement is probable. 2. 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 3. Mild C5-6 and C6-7 spinal canal stenosis. 4. Severe right lateral recess and mild left lateral recess narrowing at L3-4. 5. Moderate left L4-5 neural foraminal stenosis. Electronically Signed   By: Deatra Robinson M.D.   On: 01/26/2023 20:05   CT ABDOMEN PELVIS W CONTRAST  Result Date: 01/26/2023 CLINICAL DATA:  Abdominal pain.  Weakness. EXAM: CT ABDOMEN AND PELVIS WITH CONTRAST TECHNIQUE: Multidetector CT imaging of the abdomen and pelvis was performed using the standard protocol following bolus administration of intravenous contrast. RADIATION DOSE REDUCTION: This exam was performed according to the departmental dose-optimization program which includes automated exposure control, adjustment of the mA and/or kV according to patient size and/or use of iterative reconstruction technique. CONTRAST:  80mL OMNIPAQUE IOHEXOL 300 MG/ML  SOLN COMPARISON:  08/03/2021. FINDINGS: Lower chest: No acute findings. 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 Hepatobiliary: Liver normal in size. 2 cysts in segment 2, smaller than on the prior CT. Largest measures 2 cm. No other liver masses or lesions. Status post cholecystectomy. No bile duct dilation. Pancreas: Unremarkable. No pancreatic ductal dilatation or surrounding inflammatory changes. Spleen: Normal in size without focal abnormality. Adrenals/Urinary Tract: Normal adrenal glands. Kidneys normal in size, orientation and position with symmetric enhancement and excretion. 1.8 cm posterior midpole right renal cyst, stable with no follow-up indicated. No other renal masses. Mild bilateral hydronephrosis and hydroureter. No renal or ureteral stones. Bladder is distended.  No bladder wall thickening, mass or stone. Stomach/Bowel: Small hiatal hernia.  Stomach otherwise unremarkable. Small bowel and colon are normal in caliber. No wall thickening or inflammation. Mild to moderate  increase in the colonic stool burden. Rectum moderately distended with stool. Vascular/Lymphatic: Aortic atherosclerosis. No aneurysm. No enlarged lymph nodes. Reproductive: Unremarkable. Other: No ascites.  No hernia. Musculoskeletal: There are compression fractures of T12, L1 and L3, T12 previously treated with vertebroplasty. These fractures are new since the prior CT. Numerous small lucencies are seen throughout the visualized skeleton. Findings consistent with multiple myeloma. PET-CT performed on 12/25/2022 has an indication of initial treatment strategy for multiple myeloma. IMPRESSION: 1. There are vertebral fractures as detailed all new since the CT from 08/03/2021. Only the T12 fracture was evident on an MRI from 11/28/2022. The L1 and L3 fractures may be recent. There are underlying numerous small lucencies throughout the visualized skeleton consistent with multiple myeloma. 2. Significant bladder distension, which is the presumed cause of mild bilateral hydroureteronephrosis. 3. No other acute abnormality within the abdomen or pelvis. 4. Increased colonic stool burden and rectum moderately distended with stool. 5. Aortic atherosclerosis. Electronically Signed   By: Amie Portland M.D.   On: 01/26/2023 16:41

## 2023-02-21 NOTE — Patient Instructions (Addendum)
Take last dose of Revlimid today. Then stop for 2 weeks.   Moving forward, we will decrease the dose of Revlimid to 2.5 mg 3 weeks on and 1 week off. Your next schedule will start on Aug 19.   Decadron - take 2 pills starting Monday 8/5 and 2 pills on Tuesday 8/6.

## 2023-02-21 NOTE — Progress Notes (Signed)
Nausea, pain in bladder area, constipation and diarrhea Saturday and Sunday. Appetite is still not good.

## 2023-02-25 ENCOUNTER — Encounter: Payer: Self-pay | Admitting: Physician Assistant

## 2023-02-25 ENCOUNTER — Ambulatory Visit: Payer: Medicare HMO | Admitting: Physician Assistant

## 2023-02-25 VITALS — BP 106/62 | HR 70 | Ht 71.0 in | Wt 131.0 lb

## 2023-02-25 DIAGNOSIS — R300 Dysuria: Secondary | ICD-10-CM

## 2023-02-25 LAB — MICROSCOPIC EXAMINATION: WBC, UA: >30 /hpf — AB (ref 0–5)

## 2023-02-25 LAB — BLADDER SCAN AMB NON-IMAGING: Scan Result: 64 ml

## 2023-02-25 MED ORDER — CEFUROXIME AXETIL 250 MG PO TABS
250.0000 mg | ORAL_TABLET | Freq: Two times a day (BID) | ORAL | 0 refills | Status: AC
Start: 2023-02-25 — End: 2023-03-04

## 2023-02-25 NOTE — Progress Notes (Signed)
02/25/2023 1:52 PM   Melvin Gilmore 1940/11/22 [id number]  CC: Chief Complaint  Patient presents with   Dysuria   HPI: Melvin Gilmore is a 82 y.o. male with multiple myeloma and a recent history of urinary retention in the setting of constipation with overflow diarrhea who passed an outpatient voiding trial with [place] 5 days ago who presents today for evaluation of possible UTI.  He is accompanied today by his wife, Melvin Gilmore, who contributes to HPI.   Today he reports an approximate 2-day history of dysuria without fever, chills, nausea, or vomiting.  In-office UA today positive for trace intact blood, 1+ protein, and 2+ leukocytes; urine microscopy with >30 WBCs/HPF, 3-10 RBCs/HPF, and many bacteria. PVR 64mL.  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   Nausea vomiting and diarrhea 01/26/2023   Prostate cancer (HCC) 2023   Seasonal allergies    Skin cancer    a.) ears   Wears partial dentures    lower    Surgical History: Past Surgical History:  Procedure Laterality Date   APPENDECTOMY     CATARACT EXTRACTION W/PHACO Right 01/15/2016   Procedure: CATARACT EXTRACTION PHACO AND INTRAOCULAR LENS PLACEMENT (IOC);  Surgeon: Sherald Hess, MD;  Location: [facility];  Service: Ophthalmology;  Laterality: Right;  RIGHT CALL CELL PHONE WITH TIME   CHOLECYSTECTOMY     COLONOSCOPY     COLONOSCOPY WITH ESOPHAGOGASTRODUODENOSCOPY (EGD)     ESOPHAGOGASTRODUODENOSCOPY  07/2021   ESOPHAGOGASTRODUODENOSCOPY (EGD) WITH PROPOFOL N/A  09/23/2017   Procedure: ESOPHAGOGASTRODUODENOSCOPY (EGD) WITH PROPOFOL;  Surgeon: Toledo, Boykin Nearing, MD;  Location: [facility];  Service: Gastroenterology;  Laterality: N/A;   HIGH INTENSITY FOCUSED ULTRASOUND (HIFU) OF THE PROSTATE N/A 02/07/2022   Procedure: HIGH INTENSITY FOCUSED ULTRASOUND (HIFU) OF THE PROSTATE;  Surgeon: Orson Ape, MD;  Location: [facility];  Service: Urology;  Laterality: N/A;   HYDROCELE EXCISION / REPAIR     IR KYPHO THORACIC WITH BONE BIOPSY  12/06/2022   IR RADIOLOGIST EVAL & MGMT  11/21/2022   LASIX RT EYE     PROSTATE BIOPSY N/A 01/03/2022   Procedure: PROSTATE BIOPSY  Addison Hoffmann;  Surgeon: Orson Ape, MD;  Location: [facility];  Service: Urology;  Laterality: N/A;   ROTATOR CUFF REPAIR Left 2001    Home Medications:  Allergies as of 02/25/2023       Reactions   Amoxicillin-pot Clavulanate Rash, Other (See Comments)   Pt states he can't recall what the reactions were   Meloxicam Rash, Other (See Comments)        Medication List        Accurate as of February 25, 2023  1:52 PM. If you have any questions, ask your nurse or doctor.          acetaminophen 500 MG tablet Commonly known as: TYLENOL Take 500 mg by mouth every 6 (six) hours as needed for mild pain, moderate pain, fever or  headache.   acyclovir 400 MG tablet Commonly known as: ZOVIRAX Take 400 mg by mouth 2 (two) times daily.   ALPRAZolam 0.25 MG tablet Commonly known as: XANAX Take 1 tablet (0.25 mg total) by mouth at bedtime as needed for anxiety.   aspirin EC 81 MG tablet Take 1 tablet (81 mg total) by mouth daily. Swallow whole.   cetirizine 10 MG tablet Commonly known as: ZYRTEC Take 10 mg by mouth as needed for allergies.   dexamethasone 4 MG tablet Commonly known as: DECADRON Take 2 tablets (8 mg total) by mouth daily for 30 doses. Take 2 tablets once daily for 2 days after your daratumumab treatment   docusate sodium 100 MG capsule Commonly known as: COLACE Take 2  capsules (200 mg total) by mouth 2 (two) times daily. What changed:  when to take this reasons to take this   fluticasone 50 MCG/ACT nasal spray Commonly known as: FLONASE Place 1 spray into both nostrils daily as needed for rhinitis or allergies.   gabapentin 100 MG capsule Commonly known as: Neurontin Take 1 capsule (100 mg total) by mouth 3 (three) times daily. What changed:  when to take this reasons to take this   lactulose 10 GM/15ML solution Commonly known as: CHRONULAC Take 10-20 GM (15-30 ml) one - two times a day as needed for constipation   lenalidomide 2.5 MG capsule Commonly known as: REVLIMID Take 1 capsule (2.5 mg total) by mouth daily. Take 1 capsule by mouth once daily for 21 days, then 7 days off. Repeat every 28 days. [employer] Auth # [id number]     Date Obtained 02/21/23   levothyroxine 50 MCG tablet Commonly known as: SYNTHROID Take 50 mcg by mouth daily before breakfast.   naloxone 4 MG/0.1ML Liqd nasal spray kit Commonly known as: NARCAN SPRAY 1 SPRAY INTO ONE NOSTRIL AS DIRECTED FOR OPIOID OVERDOSE (TURN PERSON ON SIDE AFTER DOSE. IF NO RESPONSE IN 2-3 MINUTES OR PERSON RESPONDS BUT RELAPSES, REPEAT USING A NEW SPRAY DEVICE AND SPRAY INTO THE OTHER NOSTRIL. CALL 911 AFTER USE.) * EMERGENCY USE ONLY *   pantoprazole 40 MG tablet Commonly known as: PROTONIX Take 40 mg by mouth daily as needed.   potassium chloride SA 20 MEQ tablet Commonly known as: KLOR-CON M Take 1 tablet (20 mEq total) by mouth daily for 3 days.   PROBIOTIC ADVANCED PO Take 1 tablet by mouth at bedtime.   senna 8.6 MG tablet Commonly known as: Senokot Take 2 tablets (17.2 mg total) by mouth 2 (two) times daily. May crush, mix with water and give sublingually if needed.   traZODone 50 MG tablet Commonly known as: DESYREL Take 50 mg by mouth at bedtime as needed.        Allergies:  Allergies  Allergen Reactions   Amoxicillin-Pot Clavulanate Rash and Other (See Comments)     Pt states he can't recall what the reactions were   Meloxicam Rash and Other (See Comments)    Family History: Family History  Problem Relation Age of Onset   Pneumonia Father     Social History:   reports that he has never smoked. He has never used smokeless tobacco. He reports current alcohol use of about 7.0 standard drinks of alcohol per week. He reports that he does not use drugs.  Physical Exam: BP 106/62   Pulse 70   Ht 5\' 11"  (1.803 m)   Wt 131 lb (59.4 kg)   BMI 18.27 kg/m   Constitutional:  Alert and oriented, no acute distress, nontoxic appearing HEENT: [place], AT Cardiovascular: No clubbing, cyanosis, or edema Respiratory: Normal respiratory effort, no increased work of breathing Skin: No rashes, bruises or suspicious lesions Neurologic: Grossly intact, no focal deficits, moving all 4 extremities Psychiatric: Normal mood and affect  Laboratory Data: Results for orders placed or performed in visit on 02/25/23  Microscopic Examination   Urine  Result Value Ref Range   WBC, UA >30 (A) 0 - 5 /hpf   RBC, Urine 3-10 (A) 0 - 2 /hpf   Epithelial Cells (non renal) 0-10 0 - 10 /hpf   Bacteria, UA Many (A) None seen/Few  BLADDER SCAN AMB NON-IMAGING  Result Value Ref Range   Scan Result 64 ml   Assessment & Plan:   1. Dysuria UA appears grossly infected today consistent with catheter associated UTI.  Will start empiric cefuroxime and send for culture for further evaluation.  Plan to keep scheduled follow-up symptom recheck and PVR with [place] next month. - Urinalysis, Complete - CULTURE, URINE COMPREHENSIVE - cefUROXime (CEFTIN) 250 MG tablet; Take 1 tablet (250 mg total) by mouth 2 (two) times daily with a meal for 7 days.  Dispense: 14 tablet; Refill: 0   Return if symptoms worsen or fail to improve.  Carman Ching, PA-C  [facility] [place] [address] [place], [place] [address] [phone]

## 2023-02-26 ENCOUNTER — Encounter: Payer: Self-pay | Admitting: Internal Medicine

## 2023-02-26 ENCOUNTER — Other Ambulatory Visit: Payer: Self-pay | Admitting: Internal Medicine

## 2023-02-26 DIAGNOSIS — C9000 Multiple myeloma not having achieved remission: Secondary | ICD-10-CM

## 2023-02-26 NOTE — Progress Notes (Signed)
Patient is being treated for UTI. Will defer myeloma treatment by one week.

## 2023-02-27 ENCOUNTER — Ambulatory Visit: Payer: Medicare HMO | Admitting: Radiation Oncology

## 2023-02-28 ENCOUNTER — Inpatient Hospital Stay: Payer: Medicare HMO | Admitting: Internal Medicine

## 2023-02-28 ENCOUNTER — Inpatient Hospital Stay: Payer: Medicare HMO

## 2023-03-03 ENCOUNTER — Other Ambulatory Visit: Payer: Self-pay

## 2023-03-03 ENCOUNTER — Encounter: Payer: Self-pay | Admitting: Internal Medicine

## 2023-03-03 MED ORDER — CIPROFLOXACIN HCL 250 MG PO TABS: 250.0000 mg | ORAL_TABLET | Freq: Two times a day (BID) | ORAL | 0 refills | Status: DC

## 2023-03-05 ENCOUNTER — Encounter: Payer: Self-pay | Admitting: Internal Medicine

## 2023-03-05 ENCOUNTER — Telehealth: Payer: Self-pay

## 2023-03-05 ENCOUNTER — Telehealth: Payer: Self-pay | Admitting: *Deleted

## 2023-03-05 NOTE — Telephone Encounter (Signed)
Patient wife just called back now reporting that patient is having short of breath and elevated BP along with the diarrhea and is asking for a return call

## 2023-03-05 NOTE — Telephone Encounter (Signed)
Call returned to wife and advise of PA recommendations and she states that she already has a call in to Urology who ordered his antibiotics. She states that he will hold off his antibiotics tonight until they heart back from urology and if he gets worse she will take him to ER

## 2023-03-05 NOTE — Telephone Encounter (Signed)
Patient was started on antibiotics for UTI by another provider and now has diarrhea and patient asking [place] if it is ok to take Imodium for it. Please advise

## 2023-03-05 NOTE — Telephone Encounter (Addendum)
Pt's wife called stating pt is having diarrhea, shortness of breath and elevated BP. Pt's wife states issues occurred after starting cipro. Pt's wife is asking if he can take imodium to help with the diarrhea and if they should stop or switch anti Please advise.

## 2023-03-05 NOTE — Telephone Encounter (Signed)
See phone note in regards.

## 2023-03-06 NOTE — Telephone Encounter (Signed)
Okay to stop Cipro.  Unfortunately, his oral antibiotic options are very limited based on the bacteria he grew.  Carollee Herter is adding him on to her schedule tomorrow morning at 8am for a Gentamicin injection.

## 2023-03-06 NOTE — Progress Notes (Signed)
03/07/2023 12:14 PM   [name] Jun 17, 1941 [id number]  Referring provider: [name], MD [address] [facility] [place],  [place] [address]  Urological history: 1.  Prostate cancer -PSA (11/2022) - 3.44 -Previously followed by Dr. 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 Prostate biopsy performed 01/03/22 for a PSA of 8.53 and MRI showing a PI-RADS5 lesion at the left anterior transition zone. Fusion biopsy return Gleason 3+4 adenocarcinoma on the ROI cores. -He underwent HIFU by Dr. Evelene Croon 02/07/22  2.  Urinary retention -secondary to constipation (01/2023)  Chief Complaint  Patient presents with   Follow-up   HPI: Melvin Gilmore is a 82 y.o. male who presents today for UTI.  Previous records reviewed.   He was seen on 02/25/2023 and diagnosed with a UTI w/ E.coli and pseudomonas aeruginosa.  He was placed on Cipro and was having intolerable side effects.  He experienced an elevated blood pressure, shortness of breath and diarrhea.  These have all subsided once he discontinued the Cipro.  He continues to have dysuria.  Patient denies any modifying or aggravating factors.  Patient denies any recent UTI's, gross hematuria, dysuria or suprapubic/flank pain.  Patient denies any fevers, chills, nausea or vomiting.   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   Nausea vomiting and diarrhea 01/26/2023   Prostate cancer (HCC) 2023   Seasonal allergies    Skin cancer    a.) ears   Wears partial  dentures    lower    Surgical History: Past Surgical History:  Procedure Laterality Date   APPENDECTOMY     CATARACT EXTRACTION W/PHACO Right 01/15/2016   Procedure: CATARACT EXTRACTION PHACO AND INTRAOCULAR LENS PLACEMENT (IOC);  Surgeon: Sherald Hess, MD;  Location: [facility];  Service: Ophthalmology;  Laterality: Right;  RIGHT CALL CELL PHONE WITH TIME   CHOLECYSTECTOMY     COLONOSCOPY     COLONOSCOPY WITH ESOPHAGOGASTRODUODENOSCOPY (EGD)     ESOPHAGOGASTRODUODENOSCOPY  07/2021   ESOPHAGOGASTRODUODENOSCOPY (EGD) WITH PROPOFOL N/A 09/23/2017   Procedure: ESOPHAGOGASTRODUODENOSCOPY (EGD) WITH PROPOFOL;  Surgeon: Toledo, Boykin Nearing, MD;  Location: [facility];  Service: Gastroenterology;  Laterality: N/A;   HIGH INTENSITY FOCUSED ULTRASOUND (HIFU) OF THE PROSTATE N/A 02/07/2022   Procedure: HIGH INTENSITY FOCUSED ULTRASOUND (HIFU) OF THE PROSTATE;  Surgeon: Orson Ape, MD;  Location: [facility];  Service: Urology;  Laterality: N/A;   HYDROCELE EXCISION / REPAIR     IR KYPHO THORACIC WITH BONE BIOPSY  12/06/2022   IR RADIOLOGIST EVAL & MGMT  11/21/2022   LASIX RT EYE     PROSTATE BIOPSY N/A 01/03/2022   Procedure: PROSTATE BIOPSY  Addison Ritzel;  Surgeon: Orson Ape, MD;  Location: [facility];  Service: Urology;  Laterality: N/A;   ROTATOR CUFF REPAIR Left 2001    Home Medications:  Allergies as of 03/07/2023       Reactions   Ciprofloxacin Diarrhea  He experienced an elevation in blood pressure, shortness of breath and diarrhea when he took the Cipro.    Amoxicillin-pot Clavulanate Rash, Other (See Comments)   Pt states he can't recall what the reactions were   Meloxicam Rash, Other (See Comments)        Medication List        Accurate as of March 07, 2023  9:59 AM. If you have any questions, ask your nurse or doctor.          STOP taking these medications    cetirizine 10 MG tablet Commonly known as: ZYRTEC Stopped by: Bob Eastwood    ciprofloxacin 250 MG tablet Commonly known as: Cipro Stopped by: Demarus Latterell   docusate sodium 100 MG capsule Commonly known as: COLACE Stopped by: Channelle Bottger   fluticasone 50 MCG/ACT nasal spray Commonly known as: FLONASE Stopped by: Athalia Setterlund   gabapentin 100 MG capsule Commonly known as: Neurontin Stopped by: Kailoni Vahle   lactulose 10 GM/15ML solution Commonly known as: CHRONULAC Stopped by: Aamori Mcmasters   lenalidomide 2.5 MG capsule Commonly known as: REVLIMID Stopped by: Cheick Suhr   naloxone 4 MG/0.1ML Liqd nasal spray kit Commonly known as: NARCAN Stopped by: Carollee Herter Allard Lightsey   potassium chloride SA 20 MEQ tablet Commonly known as: KLOR-CON M Stopped by: Kristle Wesch   traZODone 50 MG tablet Commonly known as: DESYREL Stopped by: Michiel Cowboy       TAKE these medications    acetaminophen 500 MG tablet Commonly known as: TYLENOL Take 500 mg by mouth every 6 (six) hours as needed for mild pain, moderate pain, fever or headache.   acyclovir 400 MG tablet Commonly known as: ZOVIRAX Take 400 mg by mouth 2 (two) times daily.   ALPRAZolam 0.25 MG tablet Commonly known as: XANAX Take 1 tablet (0.25 mg total) by mouth at bedtime as needed for anxiety.   aspirin EC 81 MG tablet Take 1 tablet (81 mg total) by mouth daily. Swallow whole.   atorvastatin 40 MG tablet Commonly known as: LIPITOR Take 1 tablet (40 mg total) by mouth daily.   clopidogrel 75 MG tablet Commonly known as: Plavix Take 1 tablet (75 mg total) by mouth daily for 21 days.   dexamethasone 4 MG tablet Commonly known as: DECADRON Take 2 tablets (8 mg total) by mouth daily for 30 doses. Take 2 tablets once daily for 2 days after your daratumumab treatment   levothyroxine 50 MCG tablet Commonly known as: SYNTHROID Take 50 mcg by mouth daily before breakfast.   pantoprazole 40 MG tablet Commonly known as: PROTONIX Take 40 mg by mouth daily as  needed.   PROBIOTIC ADVANCED PO Take 1 tablet by mouth at bedtime.   senna 8.6 MG tablet Commonly known as: Senokot Take 2 tablets (17.2 mg total) by mouth 2 (two) times daily. May crush, mix with water and give sublingually if needed.        Allergies:  Allergies  Allergen Reactions   Ciprofloxacin Diarrhea    He experienced an elevation in blood pressure, shortness of breath and diarrhea when he took the Cipro.    Amoxicillin-Pot Clavulanate Rash and Other (See Comments)    Pt states he can't recall what the reactions were   Meloxicam Rash and Other (See Comments)    Family History: Family History  Problem Relation Age of Onset   Pneumonia Father     Social History:  reports that he has never smoked. He has never used smokeless  tobacco. He reports current alcohol use of about 7.0 standard drinks of alcohol per week. He reports that he does not use drugs.  ROS: Pertinent ROS in HPI  Physical Exam: BP (!) 150/72   Pulse 72   Ht 5\' 11"  (1.803 m)   Wt 131 lb (59.4 kg)   BMI 18.27 kg/m   Constitutional:  Well nourished. Alert and oriented, No acute distress. HEENT: [place] AT, moist mucus membranes.  Trachea midline Cardiovascular: No clubbing, cyanosis, or edema. Respiratory: Normal respiratory effort, no increased work of breathing. Neurologic: Grossly intact, no focal deficits, moving all 4 extremities. Psychiatric: Normal mood and affect.  Laboratory Data: Lab Results  Component Value Date   WBC 5.7 03/07/2023   HGB 12.1 (L) 03/07/2023   HCT 36.3 (L) 03/07/2023   MCV 102.0 (H) 03/07/2023   PLT 152 03/07/2023    Lab Results  Component Value Date   CREATININE 0.77 03/08/2023    Lab Results  Component Value Date   AST 29 03/07/2023   Lab Results  Component Value Date   ALT 21 03/07/2023    Urinalysis    Component Value Date/Time   COLORURINE STRAW (A) 03/07/2023 1043   APPEARANCEUR CLEAR (A) 03/07/2023 1043   LABSPEC 1.020 03/07/2023 1043    PHURINE 8.0 03/07/2023 1043   GLUCOSEU NEGATIVE 03/07/2023 1043   HGBUR NEGATIVE 03/07/2023 1043   BILIRUBINUR NEGATIVE 03/07/2023 1043   KETONESUR 5 (A) 03/07/2023 1043   PROTEINUR NEGATIVE 03/07/2023 1043   NITRITE NEGATIVE 03/07/2023 1043   LEUKOCYTESUR SMALL (A) 03/07/2023 1043  I have reviewed the labs.   Pertinent Imaging: N/A  Assessment & Plan:    1. UTI -Gentamicin 80 mg IM given in the office  -he will report if he continues to experience the dysuria after 24 hours   Return for keep follow up appointment with Dr. Lonna Cobb .  These notes generated with voice recognition software. I apologize for typographical errors.  Cloretta Ned  [facility] [address]  [address] [place], [place] [address] [phone]

## 2023-03-06 NOTE — Telephone Encounter (Signed)
Pt's wife, robin, was informed and voiced understanding.

## 2023-03-07 ENCOUNTER — Inpatient Hospital Stay: Payer: Medicare HMO

## 2023-03-07 ENCOUNTER — Ambulatory Visit: Payer: Medicare HMO | Admitting: Urology

## 2023-03-07 ENCOUNTER — Inpatient Hospital Stay: Payer: Medicare HMO | Admitting: Internal Medicine

## 2023-03-07 ENCOUNTER — Emergency Department: Payer: Medicare HMO

## 2023-03-07 ENCOUNTER — Encounter: Payer: Self-pay | Admitting: *Deleted

## 2023-03-07 ENCOUNTER — Inpatient Hospital Stay
Admission: EM | Admit: 2023-03-07 | Discharge: 2023-03-08 | DRG: 062 | Disposition: A | Discharge status: Home / Self Care | Payer: Medicare HMO | Attending: Hospitalist | Admitting: Hospitalist

## 2023-03-07 ENCOUNTER — Ambulatory Visit: Payer: Medicare HMO

## 2023-03-07 ENCOUNTER — Other Ambulatory Visit: Payer: Medicare HMO

## 2023-03-07 ENCOUNTER — Other Ambulatory Visit: Payer: Self-pay

## 2023-03-07 VITALS — BP 150/72 | HR 72 | Ht 71.0 in | Wt 131.0 lb

## 2023-03-07 DIAGNOSIS — Z66 Do not resuscitate: Secondary | ICD-10-CM | POA: Diagnosis present

## 2023-03-07 DIAGNOSIS — E039 Hypothyroidism, unspecified: Secondary | ICD-10-CM | POA: Diagnosis present

## 2023-03-07 DIAGNOSIS — Z8546 Personal history of malignant neoplasm of prostate: Secondary | ICD-10-CM | POA: Diagnosis not present

## 2023-03-07 DIAGNOSIS — Z7982 Long term (current) use of aspirin: Secondary | ICD-10-CM

## 2023-03-07 DIAGNOSIS — H53461 Homonymous bilateral field defects, right side: Secondary | ICD-10-CM | POA: Diagnosis present

## 2023-03-07 DIAGNOSIS — N390 Urinary tract infection, site not specified: Secondary | ICD-10-CM | POA: Diagnosis present

## 2023-03-07 DIAGNOSIS — C9000 Multiple myeloma not having achieved remission: Secondary | ICD-10-CM | POA: Diagnosis present

## 2023-03-07 DIAGNOSIS — B9620 Unspecified Escherichia coli [E. coli] as the cause of diseases classified elsewhere: Secondary | ICD-10-CM | POA: Diagnosis present

## 2023-03-07 DIAGNOSIS — E785 Hyperlipidemia, unspecified: Secondary | ICD-10-CM | POA: Diagnosis present

## 2023-03-07 DIAGNOSIS — E871 Hypo-osmolality and hyponatremia: Secondary | ICD-10-CM | POA: Diagnosis present

## 2023-03-07 DIAGNOSIS — R4701 Aphasia: Secondary | ICD-10-CM | POA: Diagnosis present

## 2023-03-07 DIAGNOSIS — E876 Hypokalemia: Secondary | ICD-10-CM | POA: Diagnosis present

## 2023-03-07 DIAGNOSIS — R29706 NIHSS score 6: Secondary | ICD-10-CM | POA: Diagnosis present

## 2023-03-07 DIAGNOSIS — Z85828 Personal history of other malignant neoplasm of skin: Secondary | ICD-10-CM

## 2023-03-07 DIAGNOSIS — E872 Acidosis, unspecified: Secondary | ICD-10-CM | POA: Diagnosis present

## 2023-03-07 DIAGNOSIS — Z5112 Encounter for antineoplastic immunotherapy: Secondary | ICD-10-CM | POA: Diagnosis not present

## 2023-03-07 DIAGNOSIS — R4182 Altered mental status, unspecified: Secondary | ICD-10-CM

## 2023-03-07 DIAGNOSIS — Z79890 Hormone replacement therapy (postmenopausal): Secondary | ICD-10-CM

## 2023-03-07 DIAGNOSIS — I10 Essential (primary) hypertension: Secondary | ICD-10-CM | POA: Diagnosis present

## 2023-03-07 DIAGNOSIS — Z881 Allergy status to other antibiotic agents status: Secondary | ICD-10-CM

## 2023-03-07 DIAGNOSIS — Y846 Urinary catheterization as the cause of abnormal reaction of the patient, or of later complication, without mention of misadventure at the time of the procedure: Secondary | ICD-10-CM | POA: Diagnosis present

## 2023-03-07 DIAGNOSIS — I639 Cerebral infarction, unspecified: Principal | ICD-10-CM | POA: Diagnosis present

## 2023-03-07 DIAGNOSIS — I6389 Other cerebral infarction: Secondary | ICD-10-CM | POA: Diagnosis not present

## 2023-03-07 DIAGNOSIS — H9191 Unspecified hearing loss, right ear: Secondary | ICD-10-CM | POA: Diagnosis present

## 2023-03-07 DIAGNOSIS — Z79899 Other long term (current) drug therapy: Secondary | ICD-10-CM

## 2023-03-07 DIAGNOSIS — K219 Gastro-esophageal reflux disease without esophagitis: Secondary | ICD-10-CM | POA: Diagnosis present

## 2023-03-07 DIAGNOSIS — F419 Anxiety disorder, unspecified: Secondary | ICD-10-CM | POA: Diagnosis present

## 2023-03-07 DIAGNOSIS — T83511A Infection and inflammatory reaction due to indwelling urethral catheter, initial encounter: Secondary | ICD-10-CM | POA: Diagnosis present

## 2023-03-07 DIAGNOSIS — I4891 Unspecified atrial fibrillation: Secondary | ICD-10-CM | POA: Diagnosis present

## 2023-03-07 DIAGNOSIS — Z886 Allergy status to analgesic agent status: Secondary | ICD-10-CM

## 2023-03-07 DIAGNOSIS — Z880 Allergy status to penicillin: Secondary | ICD-10-CM

## 2023-03-07 LAB — DIFFERENTIAL
Abs Immature Granulocytes: 0.02 K/uL (ref 0.00–0.07)
Basophils Absolute: 0 K/uL (ref 0.0–0.1)
Basophils Relative: 0 %
Eosinophils Absolute: 0 K/uL (ref 0.0–0.5)
Eosinophils Relative: 0 %
Immature Granulocytes: 0 %
Lymphocytes Relative: 35 %
Lymphs Abs: 2 K/uL (ref 0.7–4.0)
Monocytes Absolute: 0.3 K/uL (ref 0.1–1.0)
Monocytes Relative: 5 %
Neutro Abs: 3.4 K/uL (ref 1.7–7.7)
Neutrophils Relative %: 60 %

## 2023-03-07 LAB — CBC WITH DIFFERENTIAL (CANCER CENTER ONLY)
Abs Immature Granulocytes: 0.02 10*3/uL (ref 0.00–0.07)
Basophils Absolute: 0 10*3/uL (ref 0.0–0.1)
Basophils Relative: 0 %
Eosinophils Absolute: 0 10*3/uL (ref 0.0–0.5)
Eosinophils Relative: 0 %
HCT: 38.9 % — ABNORMAL LOW (ref 39.0–52.0)
Hemoglobin: 13.5 g/dL (ref 13.0–17.0)
Immature Granulocytes: 0 %
Lymphocytes Relative: 46 %
Lymphs Abs: 3.3 10*3/uL (ref 0.7–4.0)
MCH: 34.1 pg — ABNORMAL HIGH (ref 26.0–34.0)
MCHC: 34.7 g/dL (ref 30.0–36.0)
MCV: 98.2 fL (ref 80.0–100.0)
Monocytes Absolute: 0.3 10*3/uL (ref 0.1–1.0)
Monocytes Relative: 4 %
Neutro Abs: 3.6 10*3/uL (ref 1.7–7.7)
Neutrophils Relative %: 50 %
Platelet Count: 176 10*3/uL (ref 150–400)
RBC: 3.96 MIL/uL — ABNORMAL LOW (ref 4.22–5.81)
RDW: 15 % (ref 11.5–15.5)
WBC Count: 7.3 10*3/uL (ref 4.0–10.5)
nRBC: 0 % (ref 0.0–0.2)

## 2023-03-07 LAB — BASIC METABOLIC PANEL - CANCER CENTER ONLY
Anion gap: 11 (ref 5–15)
BUN: 11 mg/dL (ref 8–23)
CO2: 20 mmol/L — ABNORMAL LOW (ref 22–32)
Calcium: 9 mg/dL (ref 8.9–10.3)
Chloride: 98 mmol/L (ref 98–111)
Creatinine: 0.78 mg/dL (ref 0.61–1.24)
GFR, Estimated: >60 mL/min (ref >60)
Glucose, Bld: 100 mg/dL — ABNORMAL HIGH (ref 70–99)
Potassium: 3.9 mmol/L (ref 3.5–5.1)
Sodium: 129 mmol/L — ABNORMAL LOW (ref 135–145)

## 2023-03-07 LAB — CBG MONITORING, ED: Glucose-Capillary: 97 mg/dL (ref 70–99)

## 2023-03-07 LAB — CBC
HCT: 36.3 % — ABNORMAL LOW (ref 39.0–52.0)
Hemoglobin: 12.1 g/dL — ABNORMAL LOW (ref 13.0–17.0)
MCH: 34 pg (ref 26.0–34.0)
MCHC: 33.3 g/dL (ref 30.0–36.0)
MCV: 102 fL — ABNORMAL HIGH (ref 80.0–100.0)
Platelets: 152 10*3/uL (ref 150–400)
RBC: 3.56 MIL/uL — ABNORMAL LOW (ref 4.22–5.81)
RDW: 15.1 % (ref 11.5–15.5)
WBC: 5.7 10*3/uL (ref 4.0–10.5)
nRBC: 0 % (ref 0.0–0.2)

## 2023-03-07 LAB — PROTIME-INR
INR: 1.1 (ref 0.8–1.2)
Prothrombin Time: 13.9 s (ref 11.4–15.2)

## 2023-03-07 LAB — COMPREHENSIVE METABOLIC PANEL WITH GFR
ALT: 21 U/L (ref 0–44)
AST: 29 U/L (ref 15–41)
Albumin: 3.6 g/dL (ref 3.5–5.0)
Alkaline Phosphatase: 78 U/L (ref 38–126)
Anion gap: 12 (ref 5–15)
BUN: 10 mg/dL (ref 8–23)
CO2: 17 mmol/L — ABNORMAL LOW (ref 22–32)
Calcium: 8.5 mg/dL — ABNORMAL LOW (ref 8.9–10.3)
Chloride: 97 mmol/L — ABNORMAL LOW (ref 98–111)
Creatinine, Ser: 0.83 mg/dL (ref 0.61–1.24)
GFR, Estimated: >60 mL/min
Glucose, Bld: 105 mg/dL — ABNORMAL HIGH (ref 70–99)
Potassium: 3.4 mmol/L — ABNORMAL LOW (ref 3.5–5.1)
Sodium: 126 mmol/L — ABNORMAL LOW (ref 135–145)
Total Bilirubin: 1.2 mg/dL (ref 0.3–1.2)
Total Protein: 6.1 g/dL — ABNORMAL LOW (ref 6.5–8.1)

## 2023-03-07 LAB — SODIUM, URINE, RANDOM: Sodium, Ur: 79 mmol/L

## 2023-03-07 LAB — URINALYSIS, COMPLETE (UACMP) WITH MICROSCOPIC
Bacteria, UA: NONE SEEN
Bilirubin Urine: NEGATIVE
Glucose, UA: NEGATIVE mg/dL
Hgb urine dipstick: NEGATIVE
Ketones, ur: 5 mg/dL — AB
Nitrite: NEGATIVE
Protein, ur: NEGATIVE mg/dL
Specific Gravity, Urine: 1.02 (ref 1.005–1.030)
Squamous Epithelial / HPF: NONE SEEN /HPF (ref 0–5)
pH: 8 (ref 5.0–8.0)

## 2023-03-07 LAB — HEMOGLOBIN A1C
Hgb A1c MFr Bld: 4.3 % — ABNORMAL LOW (ref 4.8–5.6)
Mean Plasma Glucose: 76.71 mg/dL

## 2023-03-07 LAB — GLUCOSE, CAPILLARY: Glucose-Capillary: 107 mg/dL — ABNORMAL HIGH (ref 70–99)

## 2023-03-07 LAB — OSMOLALITY, URINE: Osmolality, Ur: 281 mOsm/kg — ABNORMAL LOW (ref 300–900)

## 2023-03-07 LAB — MRSA NEXT GEN BY PCR, NASAL: MRSA by PCR Next Gen: NOT DETECTED

## 2023-03-07 LAB — ETHANOL: Alcohol, Ethyl (B): <10 mg/dL (ref <10)

## 2023-03-07 LAB — APTT: aPTT: 25 s (ref 24–36)

## 2023-03-07 LAB — OSMOLALITY: Osmolality: 273 mOsm/kg — ABNORMAL LOW (ref 275–295)

## 2023-03-07 MED ORDER — CLEVIDIPINE BUTYRATE 0.5 MG/ML IV EMUL: 0.0000 mg/h | INTRAVENOUS | Status: DC | PRN

## 2023-03-07 MED ORDER — LABETALOL HCL 5 MG/ML IV SOLN: 20.0000 mg | Freq: Once | INTRAVENOUS | Status: DC | PRN

## 2023-03-07 MED ORDER — SODIUM CHLORIDE 0.9 % IV SOLN
2.0000 g | Freq: Two times a day (BID) | INTRAVENOUS | Status: DC
  Administered 2023-03-07 (×2): 2 g via INTRAVENOUS
  Filled 2023-03-07 (×3): qty 12.5

## 2023-03-07 MED ORDER — SODIUM CHLORIDE 0.9% FLUSH
3.0000 mL | Freq: Once | INTRAVENOUS | Status: AC
  Administered 2023-03-07: 3 mL via INTRAVENOUS

## 2023-03-07 MED ORDER — STROKE: EARLY STAGES OF RECOVERY BOOK: Freq: Once | Status: AC

## 2023-03-07 MED ORDER — GENTAMICIN SULFATE 40 MG/ML IJ SOLN
80.0000 mg | Freq: Once | INTRAMUSCULAR | Status: AC
Start: 2023-03-07 — End: 2023-03-07
  Administered 2023-03-07: 80 mg via INTRAMUSCULAR

## 2023-03-07 MED ORDER — IOHEXOL 350 MG/ML SOLN
75.0000 mL | Freq: Once | INTRAVENOUS | Status: AC | PRN
  Administered 2023-03-07: 75 mL via INTRAVENOUS

## 2023-03-07 MED ORDER — TENECTEPLASE FOR STROKE
0.2500 mg/kg | PACK | Freq: Once | INTRAVENOUS | Status: AC
  Administered 2023-03-07: 15 mg via INTRAVENOUS

## 2023-03-07 MED ORDER — CHLORHEXIDINE GLUCONATE CLOTH 2 % EX PADS
6.0000 | MEDICATED_PAD | Freq: Every day | CUTANEOUS | Status: DC
  Administered 2023-03-08: 6 via TOPICAL

## 2023-03-07 MED ORDER — SODIUM CHLORIDE 0.9 % IV SOLN: INTRAVENOUS | Status: DC

## 2023-03-07 MED ORDER — ENSURE ENLIVE PO LIQD
237.0000 mL | Freq: Two times a day (BID) | ORAL | Status: DC
  Administered 2023-03-08: 237 mL via ORAL

## 2023-03-07 MED ORDER — ACETAMINOPHEN 650 MG RE SUPP: 650.0000 mg | RECTAL | Status: DC | PRN

## 2023-03-07 MED ORDER — ACETAMINOPHEN 325 MG PO TABS
650.0000 mg | ORAL_TABLET | ORAL | Status: DC | PRN
  Administered 2023-03-08: 650 mg via ORAL
  Filled 2023-03-07: qty 2

## 2023-03-07 MED ORDER — ACETAMINOPHEN 160 MG/5ML PO SOLN: 650.0000 mg | ORAL | Status: DC | PRN

## 2023-03-07 MED ORDER — PANTOPRAZOLE SODIUM 40 MG IV SOLR
40.0000 mg | Freq: Every day | INTRAVENOUS | Status: DC
  Administered 2023-03-07: 40 mg via INTRAVENOUS
  Filled 2023-03-07: qty 10

## 2023-03-07 NOTE — Plan of Care (Signed)
  Problem: Education: Goal: Knowledge of General Education information will improve Description: Including pain rating scale, medication(s)/side effects and non-pharmacologic comfort measures Outcome: Progressing   Problem: Skin Integrity: Goal: Risk for impaired skin integrity will decrease Outcome: Progressing   Problem: Education: Goal: Knowledge of disease or condition will improve Outcome: Progressing Goal: Knowledge of secondary prevention will improve (MUST DOCUMENT ALL) Outcome: Progressing Goal: Knowledge of patient specific risk factors will improve Loraine Leriche N/A or DELETE if not current risk factor) Outcome: Progressing   Problem: Ischemic Stroke/TIA Tissue Perfusion: Goal: Complications of ischemic stroke/TIA will be minimized Outcome: Progressing   Problem: Self-Care: Goal: Ability to participate in self-care as condition permits will improve Outcome: Progressing

## 2023-03-07 NOTE — Progress Notes (Signed)
   03/07/23 1500  Spiritual Encounters  Type of Visit Initial  Care provided to: Pt and family  Conversation partners present during encounter Nurse  Referral source Code page  Reason for visit Trauma  OnCall Visit Yes  Spiritual Framework  Presenting Themes Courage hope and growth  Community/Connection Friend(s);Family;Significant other;Faith community  Patient Stress Factors Health changes  Family Stress Factors Health changes  Interventions  Spiritual Care Interventions Made Prayer;Compassionate presence;Reflective listening;Encouragement;Explored values/beliefs/practices/strengths  Intervention Outcomes  Outcomes Awareness around self/spiritual resourses;Awareness of health;Reduced anxiety;Awareness of support;Reduced isolation  Spiritual Care Plan  Spiritual Care Issues Still Outstanding No further spiritual care needs at this time (see row info)   Chaplain followed up with patient and family after the patient was moved to the ICU. Chaplain prayed with the patient and his wife before he was taken down for another CT scan. Patients wife talked to the chaplain about family relationships and different struggles that they have faced. Chaplain will continue to follow up with patient and family.

## 2023-03-07 NOTE — Progress Notes (Signed)
SLP F/U Note  Patient Details Name: Melvin Gilmore MRN: [id number] DOB: January 05, 1941   Cancelled treatment:       Reason Eval/Treat Not Completed:  (chart reviewed; consulted NSG)  Received order for cognitive-linguistic evaluation in setting of Code Stroke activation d/t "Patient with disoriented, right hemianopia, and Expressive aphasia" [facility]. Pt has only been admitted for 1 hour; multiple tests ongoing. NSG to f/u w/ [facility] swallow screen.  ST services will f/u w/ cognitive-linguistic evaluation tomorrow post Time given; pt has only been admitted 1 hour. NSG updated.      Jerilynn Som, MS, CCC-SLP Speech Language Pathologist Rehab Services; [facility] [phone] (ascom) Chanae Gemma 03/07/2023, 11:07 AM

## 2023-03-07 NOTE — ED Provider Notes (Signed)
[facility] Provider Note   Event Date/Time   First MD Initiated Contact with Patient 03/07/23 [phone]     (approximate) History  Code Stroke  HPI Melvin Gilmore is a 82 y.o. male with a past medical history of multiple myeloma currently undergoing chemotherapy who presents for altered mental status that began at approximately 0830 this morning.  Patient arrives via EMS who states that he went to a doctor's appointment this morning and was told to present to the emergency department due to the symptoms.  Further history and review of systems are unable to be obtained at this time. ROS: Unable to assess   Physical Exam  Triage Vital Signs: ED Triage Vitals  Encounter Vitals Group     BP 03/07/23 0948 112/75     Systolic BP Percentile --      Diastolic BP Percentile --      Pulse Rate 03/07/23 0948 65     Resp 03/07/23 0948 16     Temp 03/07/23 0948 98.1 F (36.7 C)     Temp Source 03/07/23 0948 Oral     SpO2 03/07/23 0948 100 %     Weight --      Height 03/07/23 1009 5\' 11"  (1.803 m)     Head Circumference --      Peak Flow --      Pain Score 03/07/23 1130 0     Pain Loc --      Pain Education --      Exclude from Growth Chart --    Most recent vital signs: Vitals:   03/07/23 1430 03/07/23 1500  BP: (!) 143/74 (!) 130/58  Pulse: (!) 58 63  Resp: 18 (!) 24  Temp:    SpO2: 100% 99%   General: Awake, cooperative CV:  Good peripheral perfusion.  Resp:  Normal effort.  Abd:  No distention.  Other:  82 year old well-developed, well-nourished Caucasian male resting comfortably in no acute distress.  Aphasic.  Able to follow simple commands.  Speech is nonsensical ED Results / Procedures / Treatments  Labs (all labs ordered are listed, but only abnormal results are displayed) Labs Reviewed  CBC - Abnormal; Notable for the following components:      Result Value   RBC 3.56 (*)    Hemoglobin 12.1 (*)    HCT 36.3 (*)    MCV 102.0 (*)    All other  components within normal limits  COMPREHENSIVE METABOLIC PANEL - Abnormal; Notable for the following components:   Sodium 126 (*)    Potassium 3.4 (*)    Chloride 97 (*)    CO2 17 (*)    Glucose, Bld 105 (*)    Calcium 8.5 (*)    Total Protein 6.1 (*)    All other components within normal limits  HEMOGLOBIN A1C - Abnormal; Notable for the following components:   Hgb A1c MFr Bld 4.3 (*)    All other components within normal limits  URINALYSIS, COMPLETE (UACMP) WITH MICROSCOPIC - Abnormal; Notable for the following components:   Color, Urine STRAW (*)    APPearance CLEAR (*)    Ketones, ur 5 (*)    Leukocytes,Ua SMALL (*)    All other components within normal limits  OSMOLALITY, URINE - Abnormal; Notable for the following components:   Osmolality, Ur 281 (*)    All other components within normal limits  OSMOLALITY - Abnormal; Notable for the following components:   Osmolality 273 (*)  All other components within normal limits  GLUCOSE, CAPILLARY - Abnormal; Notable for the following components:   Glucose-Capillary 107 (*)    All other components within normal limits  MRSA NEXT GEN BY PCR, NASAL  URINE CULTURE  PROTIME-INR  APTT  DIFFERENTIAL  ETHANOL  SODIUM, URINE, RANDOM  CBG MONITORING, ED  I-STAT CREATININE, ED  CBG MONITORING, ED   EKG ED ECG REPORT I, Merwyn Katos, the attending physician, personally viewed and interpreted this ECG. Date: 03/07/2023 EKG Time: 1022 Rate: 69 Rhythm: normal sinus rhythm QRS Axis: normal Intervals: normal ST/T Wave abnormalities: normal Narrative Interpretation: no evidence of acute ischemia RADIOLOGY ED MD interpretation: CT of the head without contrast interpreted by me shows no evidence of acute abnormalities including no intracerebral hemorrhage, obvious masses, or significant edema  CT angiography of the head and neck with IV contrast shows no acute intracranial pathology -Agree with radiology assessment Official  radiology report(s): CT HEAD CODE STROKE WO CONTRAST  Result Date: 03/07/2023 CLINICAL DATA:  Code stroke. Disorientation, right hemianopsia, and aphasia EXAM: CT ANGIOGRAPHY HEAD AND NECK WITH AND WITHOUT CONTRAST TECHNIQUE: Multidetector CT imaging of the head and neck was performed using the standard protocol during bolus administration of intravenous contrast. Multiplanar CT image reconstructions and MIPs were obtained to evaluate the vascular anatomy. Carotid stenosis measurements (when applicable) are obtained utilizing NASCET criteria, using the distal internal carotid diameter as the denominator. RADIATION DOSE REDUCTION: This exam was performed according to the departmental dose-optimization program which includes automated exposure control, adjustment of the mA and/or kV according to patient size and/or use of iterative reconstruction technique. CONTRAST:  75mL OMNIPAQUE IOHEXOL 350 MG/ML SOLN COMPARISON:  CT head 08/21/2022 FINDINGS: CT HEAD FINDINGS Brain: There is no acute intracranial hemorrhage, extra-axial fluid collection, or acute infarct. Parenchymal volume is within expected limits for age. The ventricles are normal in size. Gray-white differentiation is preserved The pituitary and suprasellar region are normal. There is no mass lesion. There is no mass effect or midline shift. Vascular: No hyperdense vessel or unexpected calcification. Skull: Normal. Negative for fracture or focal lesion. Sinuses/Orbits: The imaged paranasal sinuses are clear. A right lens implant is noted. The globes and orbits are otherwise unremarkable. Other: The mastoid air cells and middle ear cavities are clear. ASPECTS [facility] Stroke Program Early CT Score) - Ganglionic level infarction (caudate, lentiform nuclei, internal capsule, insula, M1-M3 cortex): 7 - Supraganglionic infarction (M4-M6 cortex): 3 Total score (0-10 with 10 being normal): 10 CTA NECK FINDINGS Aortic arch: There is a minimal calcified plaque in  the imaged aortic arch. The origins of the major branch vessels are patent. The subclavian arteries are patent to the level imaged. Right carotid system: The right common, internal, and external carotid arteries are patent, with minimal plaque at the bifurcation but no hemodynamically significant stenosis or occlusion. There is no evidence of dissection or aneurysm. Left carotid system: The left common, internal, and external carotid arteries are patent, with minimal plaque at the bifurcation but no hemodynamically significant stenosis or occlusion. There is no evidence of dissection or aneurysm. Vertebral arteries: The vertebral arteries are patent, without hemodynamically significant stenosis or occlusion there is no evidence of dissection or aneurysm. Skeleton: There is mild compression deformity of the superior T3 and T4 endplates, similar to the prior thoracic spine MRI. There are diffuse small lucencies throughout the imaged spine consistent with myeloma lesions. There is no definite soft tissue component within the spinal canal. Other neck: The soft tissues  of the neck are unremarkable. Upper chest: There is scarring in both lung apices. Review of the MIP images confirms the above findings CTA HEAD FINDINGS Anterior circulation: There is mild calcified plaque in the intracranial ICAs without significant stenosis or occlusion. The bilateral MCAs and ACAS are patent, without proximal stenosis or occlusion. There is a common A2 trunk, a normal variant. There is no aneurysm or AVM. Posterior circulation: There is diminutive enhancement of the right V4 segment, favored predominantly congenital with possible superimposed atherosclerosis. The left V4 segment is patent. The basilar artery is patent. The major cerebellar arteries are patent. The bilateral PCAs are patent, predominantly supplied by posterior communicating arteries bilaterally (fetal origins). There is no proximal stenosis or occlusion. There is no  aneurysm or AVM. Venous sinuses: Suboptimally evaluated due to bolus timing. Anatomic variants: As above. Review of the MIP images confirms the above findings IMPRESSION: 1. No acute intracranial pathology. 2. Patent vasculature of the head and neck, with no hemodynamically significant stenosis, occlusion, or dissection. 3. Diffuse lucencies throughout the imaged spine consistent with myeloma lesions. Findings of the initial noncontrast head CT communicated to Dr Amada Jupiter at 10:00 am. The CTA results were discussed at approximately 10:15 am. Electronically Signed   By: Lesia Hausen M.D.   On: 03/07/2023 10:28   CT ANGIO HEAD NECK W WO CM (CODE STROKE)  Result Date: 03/07/2023 CLINICAL DATA:  Code stroke. Disorientation, right hemianopsia, and aphasia EXAM: CT ANGIOGRAPHY HEAD AND NECK WITH AND WITHOUT CONTRAST TECHNIQUE: Multidetector CT imaging of the head and neck was performed using the standard protocol during bolus administration of intravenous contrast. Multiplanar CT image reconstructions and MIPs were obtained to evaluate the vascular anatomy. Carotid stenosis measurements (when applicable) are obtained utilizing NASCET criteria, using the distal internal carotid diameter as the denominator. RADIATION DOSE REDUCTION: This exam was performed according to the departmental dose-optimization program which includes automated exposure control, adjustment of the mA and/or kV according to patient size and/or use of iterative reconstruction technique. CONTRAST:  75mL OMNIPAQUE IOHEXOL 350 MG/ML SOLN COMPARISON:  CT head 08/21/2022 FINDINGS: CT HEAD FINDINGS Brain: There is no acute intracranial hemorrhage, extra-axial fluid collection, or acute infarct. Parenchymal volume is within expected limits for age. The ventricles are normal in size. Gray-white differentiation is preserved The pituitary and suprasellar region are normal. There is no mass lesion. There is no mass effect or midline shift. Vascular: No  hyperdense vessel or unexpected calcification. Skull: Normal. Negative for fracture or focal lesion. Sinuses/Orbits: The imaged paranasal sinuses are clear. A right lens implant is noted. The globes and orbits are otherwise unremarkable. Other: The mastoid air cells and middle ear cavities are clear. ASPECTS [facility] Stroke Program Early CT Score) - Ganglionic level infarction (caudate, lentiform nuclei, internal capsule, insula, M1-M3 cortex): 7 - Supraganglionic infarction (M4-M6 cortex): 3 Total score (0-10 with 10 being normal): 10 CTA NECK FINDINGS Aortic arch: There is a minimal calcified plaque in the imaged aortic arch. The origins of the major branch vessels are patent. The subclavian arteries are patent to the level imaged. Right carotid system: The right common, internal, and external carotid arteries are patent, with minimal plaque at the bifurcation but no hemodynamically significant stenosis or occlusion. There is no evidence of dissection or aneurysm. Left carotid system: The left common, internal, and external carotid arteries are patent, with minimal plaque at the bifurcation but no hemodynamically significant stenosis or occlusion. There is no evidence of dissection or aneurysm. Vertebral arteries: The vertebral  arteries are patent, without hemodynamically significant stenosis or occlusion there is no evidence of dissection or aneurysm. Skeleton: There is mild compression deformity of the superior T3 and T4 endplates, similar to the prior thoracic spine MRI. There are diffuse small lucencies throughout the imaged spine consistent with myeloma lesions. There is no definite soft tissue component within the spinal canal. Other neck: The soft tissues of the neck are unremarkable. Upper chest: There is scarring in both lung apices. Review of the MIP images confirms the above findings CTA HEAD FINDINGS Anterior circulation: There is mild calcified plaque in the intracranial ICAs without significant  stenosis or occlusion. The bilateral MCAs and ACAS are patent, without proximal stenosis or occlusion. There is a common A2 trunk, a normal variant. There is no aneurysm or AVM. Posterior circulation: There is diminutive enhancement of the right V4 segment, favored predominantly congenital with possible superimposed atherosclerosis. The left V4 segment is patent. The basilar artery is patent. The major cerebellar arteries are patent. The bilateral PCAs are patent, predominantly supplied by posterior communicating arteries bilaterally (fetal origins). There is no proximal stenosis or occlusion. There is no aneurysm or AVM. Venous sinuses: Suboptimally evaluated due to bolus timing. Anatomic variants: As above. Review of the MIP images confirms the above findings IMPRESSION: 1. No acute intracranial pathology. 2. Patent vasculature of the head and neck, with no hemodynamically significant stenosis, occlusion, or dissection. 3. Diffuse lucencies throughout the imaged spine consistent with myeloma lesions. Findings of the initial noncontrast head CT communicated to Dr Amada Jupiter at 10:00 am. The CTA results were discussed at approximately 10:15 am. Electronically Signed   By: Lesia Hausen M.D.   On: 03/07/2023 10:28   PROCEDURES: Critical Care performed: Yes, see critical care procedure note(s) .1-3 Lead EKG Interpretation  Performed by: Merwyn Katos, MD Authorized by: Merwyn Katos, MD     Interpretation: normal     ECG rate:  71   ECG rate assessment: normal     Rhythm: sinus rhythm     Ectopy: none     Conduction: normal   CRITICAL CARE Performed by: Merwyn Katos  Total critical care time: 33 minutes  Critical care time was exclusive of separately billable procedures and treating other patients.  Critical care was necessary to treat or prevent imminent or life-threatening deterioration.  Critical care was time spent personally by me on the following activities: development of treatment  plan with patient and/or surrogate as well as nursing, discussions with consultants, evaluation of patient's response to treatment, examination of patient, obtaining history from patient or surrogate, ordering and performing treatments and interventions, ordering and review of laboratory studies, ordering and review of radiographic studies, pulse oximetry and re-evaluation of patient's condition.  MEDICATIONS ORDERED IN ED: Medications  labetalol (NORMODYNE) injection 20 mg (has no administration in time range)    And  clevidipine (CLEVIPREX) infusion 0.5 mg/mL (has no administration in time range)   stroke: early stages of recovery book (has no administration in time range)  acetaminophen (TYLENOL) tablet 650 mg (has no administration in time range)    Or  acetaminophen (TYLENOL) 160 MG/5ML solution 650 mg (has no administration in time range)    Or  acetaminophen (TYLENOL) suppository 650 mg (has no administration in time range)  pantoprazole (PROTONIX) injection 40 mg (has no administration in time range)  0.9 %  sodium chloride infusion ( Intravenous New Bag/Given 03/07/23 1214)  ceFEPIme (MAXIPIME) 2 g in sodium chloride 0.9 % 100 mL  IVPB (2 g Intravenous New Bag/Given 03/07/23 1333)  sodium chloride flush (NS) 0.9 % injection 3 mL (3 mLs Intravenous Given 03/07/23 1015)  iohexol (OMNIPAQUE) 350 MG/ML injection 75 mL (75 mLs Intravenous Contrast Given 03/07/23 1002)  tenecteplase (TNKASE) injection for Stroke 15 mg (15 mg Intravenous Given 03/07/23 1011)   IMPRESSION / MDM / ASSESSMENT AND PLAN / ED COURSE  I reviewed the triage vital signs and the nursing notes.                             The patient is on the cardiac monitor to evaluate for evidence of arrhythmia and/or significant heart rate changes. Patient's presentation is most consistent with acute presentation with potential threat to life or bodily function. Stroke alert PMH risk factors: Multiple myeloma, hypertension,  hypercholesterolemia Neurologic Deficits: Aphasia Last known Well Time: 0830 NIH Stroke Score: 6 Given History and Exam I have lower suspicion for infectious etiology, neurologic changes secondary to toxicologic ingestion, seizure, complex migraine. Presentation concerning for possible stroke requiring workup.  Workup: Labs: POC glucose, CBC, BMP, LFTs, Troponin, PT/INR, PTT, Type and Screen Other Diagnostics: ECG, CXR, non-contrast head CT followed by CTA brain and neck (to r/o large vessel occlusion amenable to thrombectomy) Interventions: Patient low on NIH scale and out of the window for tpa.  Consult: Neurology. Discussed with Dr. Amada Jupiter regarding patient's neurological symptoms and last well-known time and eligibility for TPA criteria. Disposition: Admission to ICU.   FINAL CLINICAL IMPRESSION(S) / ED DIAGNOSES   Final diagnoses:  Aphasia   Rx / DC Orders   ED Discharge Orders     None      Note:  This document was prepared using Dragon voice recognition software and may include unintentional dictation errors.   Merwyn Katos, MD 03/07/23 1537

## 2023-03-07 NOTE — Consult Note (Signed)
Neurology Consultation Reason for Consult: Aphasia Referring Physician: Vicente Males, E  CC: Aphasia  History is obtained from: Patient  HPI: Melvin Gilmore is a 82 y.o. male with a history of hypertension, hyperlipidemia, prostate cancer, multiple myeloma who presents with acute aphasia that started this morning.  He has missed a couple of treatments for his multiple myeloma due to urinary tract infection, and was just changed to Cipro.  He was in his normal state of health this morning, and then abruptly at 8:30 AM his wife witnessed a change where he began, and confused.  He was already going to his urologist office, and was sent to the emergency department when they saw what symptoms he was complaining of this morning.  In the ER, it was noted that he was aphasic and therefore code stroke was activated.   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 He has never used smokeless tobacco. He reports current alcohol use of about 7.0 standard drinks of alcohol per week. He reports that he does not use drugs.   Exam: Current vital signs: BP 112/75 (BP Location: Left Arm)   Pulse 65   Temp 98.1 F (36.7 C)  (Oral)   Resp 16   Ht 5\' 11"  (1.803 m)   SpO2 100%   BMI 18.27 kg/m  Vital signs in last 24 hours: Temp:  [98.1 F (36.7 C)] 98.1 F (36.7 C) (08/16 0948) Pulse Rate:  [65-72] 65 (08/16 0948) Resp:  [16] 16 (08/16 0948) BP: (112-150)/(72-75) 112/75 (08/16 0948) SpO2:  [100 %] 100 % (08/16 0948) Weight:  [59.4 kg] 59.4 kg (08/16 4010)   Physical Exam  Appears well-developed and well-nourished.   Neuro: Mental Status: Patient is very aphasic, he is able to understand some and able to follow some simple commands, speech is nonsensical sounds, not formed words Cranial Nerves: II: Though he does not blink to threat from either direction, he does engage with visual stimuli when they are present from the left, strongly suggesting a right hemianopia. Pupils are equal, round, and reactive to light.   III,IV, VI: EOMI without ptosis or diploplia.  V: Facial sensation is symmetric to temperature VII: Facial movement is symmetric in the lower face, he has mildly asymmetric forehead wrinkling, though I question if this is present on his previous epic photo as well. VIII: hearing is intact to voice X: Uvula elevates symmetrically XI: Shoulder shrug is symmetric. XII: tongue is midline without atrophy or fasciculations.  Motor: Tone  is normal. Bulk is normal. 5/5 strength was present in all four extremities.  Sensory: He responds to noxious stimulation bilaterally Cerebellar: He is markedly tremulous throughout, but no clear ataxia     I have reviewed labs in epic and the results pertinent to this consultation are: Platelets were 111 at last check Sodium 129, was 129 2 weeks ago as well and always is consistently mildly hyponatremic.  I have reviewed the images obtained: CT/CTA-negative  Impression: 82 year old male with abrupt onset aphasia and I suspect right hemianopia.  With the abrupt onset, I strongly suspect this represents acute ischemic stroke and I discussed risks,  benefits, and alternatives of IV tenecteplase with the patient's wife who agreed with proceeding.  With his description of having a racing heart over the last few days, I do wonder if he is flipping in and out of atrial fibrillation with RVR.  He is not currently in atrial fibrillation.  Recommendations: - HgbA1c, fasting lipid panel - MRI of the brain without contrast - Frequent neuro checks - Echocardiogram - Prophylactic therapy-none for 24-hour - Risk factor modification - Telemetry monitoring - PT consult, OT consult, Speech consult -With lack of LVO, I will get EEG to confirm this does not represent seizure.   This patient is critically ill and at significant risk of neurological worsening, death and care requires constant monitoring of vital signs, hemodynamics,respiratory and cardiac monitoring, neurological assessment, discussion with family, other specialists and medical decision making of high complexity. I spent 50 minutes of neurocritical care time  in the care of  this patient. This was time spent independent of any time provided by nurse practitioner or PA.  Ritta Slot, MD [facility] [phone]  If 7pm- 7am, please page neurology on call as listed in AMION. 03/07/2023  10:39 AM

## 2023-03-07 NOTE — Progress Notes (Signed)
   03/07/23 1000  Spiritual Encounters  Type of Visit Initial  Care provided to: Pt and family  Conversation partners present during encounter Nurse;Physician  Referral source Code page  Reason for visit Trauma  OnCall Visit Yes  Spiritual Framework  Presenting Themes Courage hope and growth;Impactful experiences and emotions  Community/Connection Family;Friend(s);Significant other  Patient Stress Factors Health changes  Family Stress Factors Health changes  Interventions  Spiritual Care Interventions Made Established relationship of care and support;Compassionate presence;Reflective listening;Encouragement;Normalization of emotions  Intervention Outcomes  Outcomes Awareness around self/spiritual resourses;Awareness of health;Reduced isolation;Reduced fear;Reduced anxiety  Spiritual Care Plan  Spiritual Care Issues Still Outstanding Chaplain will continue to follow   Chaplain received code Stroke for patient. Chaplain met with the patient and family. Chaplain was there to offer compassionate presence and words of hope and courage. Patient is being transferred to ICU. Chaplain will follow up with the patient and family in the ICU.

## 2023-03-07 NOTE — Code Documentation (Signed)
Stroke Response Nurse Documentation Code Documentation  Melvin Gilmore is a 82 y.o. male arriving to [facility] via [employer] on 03/07/2023 with past medical hx of hypothyroidism, meniere disease, GERD, HTN, HLD, cancer. On No antithrombotic. Code stroke was activated by ED.   Patient from [facility] where he was LKW at 0830 and now complaining of disorientation and aphasia. Patient was at cancer center where family noticed patient had acute onset aphasia and confusion. Patient brought over and code stroke was activated.   Stroke team meets patient and care team in CT. Labs drawn and patient cleared for CT by Dr. Lenard Lance. Patient to CT with team. NIHSS 6, see documentation for details and code stroke times. Patient with disoriented, right hemianopia, and Expressive aphasia  on exam. The following imaging was completed:  CT Head and CTA. Patient is a candidate for IV Thrombolytic, per MD. TNK given in CT at 1011. Patient is not a candidate for IR due to no LVO seen on imaging, per MD.   Care Plan: post-TNK NIHSS and vital signs- every 15 minute vital signs and NIHSS (until 1211),  every 30 minute vital signs and NIHSS x 6 hours (until 1811), every 1 hour vital signs and NIHSS x 16 hours (until 1011). Swallow screen per order.   Bedside handoff with ED RN Lavone Neri  Stroke Response RN

## 2023-03-07 NOTE — ED Notes (Signed)
Dr. Lenard Lance assess patient and ok to proceed to CT

## 2023-03-07 NOTE — ED Notes (Signed)
CALLED [facility] FOR CODE STROKE PER SAMANTHA, PT WAS DISORIENTED WITH SLURRED SPEECH

## 2023-03-07 NOTE — Progress Notes (Signed)
IM Injection  Patient is present today for an IM Injection for treatment of Gentamicin Drug: Gentamicin Dose:80 mg (2mL) Location:[facility] Lot: [id number] Exp:01/20/2024 Patient tolerated well, no complications were noted  Performed by: Randa Lynn, RMA  Additional notes/ Follow up: 03/26/2023 with Dr.Stoioff

## 2023-03-07 NOTE — Progress Notes (Addendum)
1300: Patient talking in full sentences. Speech clear. Dr. Amada Jupiter at bedside. 14:00 Walked in to do NIH stroke scale. Noticed pt having mild aphasia. Pt having a hard time getting words out. Neurologist notified. Per MD will order STAT head CT. Will continue to monitor.

## 2023-03-07 NOTE — Code Documentation (Signed)
CODE STROKE- PHARMACY COMMUNICATION   Time CODE STROKE called/page received:[id number]  Time response to CODE STROKE was made (in person or via phone): [id number]  Time Stroke Kit retrieved from Pyxis (only if needed):0955 (15 mg IV x 1 given 8/16 @ 1011)  Name of Provider/Nurse contacted:Dr. 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 Authorizing Provider  acetaminophen (TYLENOL) 500 MG tablet Take 500 mg by mouth every 6 (six) hours as needed for mild pain, moderate pain, fever or headache.    [provider]  acyclovir (ZOVIRAX) 400 MG tablet Take 400 mg by mouth 2 (two) times daily.    [name], MD  ALPRAZolam Prudy Feeler) 0.25 MG tablet Take 1 tablet (0.25 mg total) by mouth at bedtime as needed for anxiety. 01/16/23   [name], MD  aspirin EC 81 MG tablet Take 1 tablet (81 mg total) by mouth daily. Swallow whole. 12/13/22 03/13/23  [name], MD  dexamethasone (DECADRON) 4 MG tablet Take 2 tablets (8 mg total) by mouth daily for 30 doses. Take 2 tablets once daily for 2 days after your daratumumab treatment 02/21/23 03/23/23  [name], MD  levothyroxine (SYNTHROID, LEVOTHROID) 50 MCG tablet Take 50 mcg by mouth daily before breakfast.    [provider]  pantoprazole (PROTONIX) 40 MG tablet  Take 40 mg by mouth daily as needed.    [provider]  Probiotic Product (PROBIOTIC ADVANCED PO) Take 1 tablet by mouth at bedtime.    [provider]  senna (SENOKOT) 8.6 MG tablet Take 2 tablets (17.2 mg total) by mouth 2 (two) times daily. May crush, mix with water and give sublingually if needed. 01/28/23   Delfino Lovett, MD  cetirizine (ZYRTEC) 10 MG tablet Take 10 mg by mouth as needed for allergies.  03/07/23  [provider]  ciprofloxacin (CIPRO) 250 MG tablet Take 1 tablet (250 mg total) by mouth 2 (two) times daily for 7 days. 03/03/23 03/07/23  Carman Ching, PA-C  docusate sodium (COLACE) 100 MG capsule Take 2 capsules (200 mg total) by mouth 2 (two) times daily. Patient taking differently: Take 200 mg by mouth daily as needed for moderate constipation or mild constipation. 02/07/22 03/07/23  Orson Ape, MD  fluticasone (FLONASE) 50 MCG/ACT nasal spray Place 1 spray into both nostrils daily as needed for rhinitis or allergies.  03/07/23  [provider]  gabapentin (NEURONTIN) 100 MG capsule Take 1 capsule (100 mg total) by mouth 3 (three) times daily. Patient taking differently: Take 100 mg by mouth daily as needed. 01/03/23 03/07/23  Borders, Daryl Eastern, NP  lactulose (CHRONULAC) 10 GM/15ML solution Take 10-20 GM (15-30 ml)  one - two times a day as needed for constipation 12/25/22 03/07/23  Borders, Daryl Eastern, NP  lenalidomide (REVLIMID) 2.5 MG capsule Take 1 capsule (2.5 mg total) by mouth daily. Take 1 capsule by mouth once daily for 21 days, then 7 days off. Repeat every 28 days. [employer] Auth # [id number]     Date Obtained 02/21/23 02/21/23 03/07/23  [name], MD  naloxone [facility]) nasal spray 4 mg/0.1 mL SPRAY 1 SPRAY INTO ONE NOSTRIL AS DIRECTED FOR OPIOID OVERDOSE (TURN PERSON ON SIDE AFTER DOSE. IF NO RESPONSE IN 2-3 MINUTES OR PERSON RESPONDS BUT RELAPSES, REPEAT USING A NEW SPRAY DEVICE AND SPRAY INTO THE OTHER NOSTRIL. CALL 911 AFTER USE.) *  EMERGENCY USE ONLY * 01/02/23 03/07/23  Borders, Daryl Eastern, NP  potassium chloride SA (KLOR-CON M) 20 MEQ tablet Take 1 tablet (20 mEq total) by mouth daily for 3 days. 02/21/23 03/07/23  [name], MD  traZODone (DESYREL) 50 MG tablet Take 50 mg by mouth at bedtime as needed. 02/05/23 03/07/23  [provider]    Barrie Folk ,PharmD Clinical Pharmacist  03/07/2023  10:21 AM

## 2023-03-07 NOTE — H&P (Signed)
NAME:  Melvin Gilmore, MRN:  [id number], DOB:  1940-10-12, LOS: 0 ADMISSION DATE:  03/07/2023, CONSULTATION DATE:  03/07/2023 REFERRING MD:  Dr. Amada Jupiter, CHIEF COMPLAINT:  Acute Aphasia   Brief Pt Description / Synopsis:  82 y.o male with PMHx significant for Prostrate cancer, multiple myeloma, and recent catheter associated UTI (culture + for E. Coli & Pseudomonas) who presented with Acute onset of Aphasia concerning for Acute CVA, status post TNK administration.  History of Present Illness:  Melvin Gilmore is a 82 y.o. male with a past medical significant for hypertension, hyperlipidemia, prostrate cancer, and multiple myeloma who presented to [facility] ED on 03/07/23 due to complaint of acute onset aphasia.  His wife reports that he was in his normal state of health, until at 8:30 am this morning he abruptly seemed confused with difficulty speaking.  Of note, he has missed a couple of treatments for his multiple myeloma due to recent catheter associated urinary tract infection which was diagnosed on 02/25/23, of which he was placed on Cefuroxime. Urine culture grew out E. Coli and Pseudomonas. He was changed to Ciprofloxacin on 03/03/23, with plans to complete a 7 day course.  On 8/14 he developed diarrhea, shortness of breath, sensation of rapid pulse, and elevated BP, of which he was advised to hold Cipro with plans to go in to Urology office for Gentamicin injection.  ED Course: Initial Vital Signs: 98.1 F orally, RR 16, pulse 65, BP 150/72, SpO2 100% on room air Significant Labs: Sodium 129, bicarb 20, glucose 100, Imaging CT Head w/o contrast>>IMPRESSION: 1. No acute intracranial pathology. CT Angio Head & Neck>>IMPRESSION: Patent vasculature of the head and neck, with no hemodynamically significant stenosis, occlusion, or dissection. Diffuse lucencies throughout the imaged spine consistent with myeloma lesions. Medications Administered: TNK @ 10:11  Given his presenting symptoms,  code Stroke was called.  He was evaluated by Neurology, and decision was made to administer TNK.  PCCM is asked to admit to ICU for further workup and treatment.  Please see "Significant Hospital Events" section below for full detailed hospital course.   Pertinent  Medical History   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   Nausea vomiting and diarrhea 01/26/2023   Prostate cancer (HCC) 2023   Seasonal allergies    Skin cancer    a.) ears   Wears partial dentures    lower    Micro Data:  8/6 (outpatient urology): Urine>> E.coli & Pseudomonas 8/16: Urine>>  Antimicrobials:   Anti-infectives (From admission, onward)    Start     Dose/Rate Route Frequency Ordered Stop   03/07/23 1145  ceFEPIme (MAXIPIME) 2 g in sodium chloride 0.9 % 100 mL IVPB        2 g 200 mL/hr over 30 Minutes Intravenous Every 12 hours 03/07/23 1141         Significant Hospital Events: Including procedures, antibiotic start and stop dates in addition to other pertinent events  8/16: Presented to ED with acute aphasia, evaluated by Neurology and administered TNK. PCCM asked to admit to ICU for close thrombolytic monitoring.  Interim History / Subjective:  -Pt previously evaluated by Neurology and has received TNK -Pt is awake and alert, with continued expressive aphasia -Able to follow commands, 5/5 strength in all extremities (RUE may be somewhat only slightly weaker than LUE) -Hemodynamically stable on room air, SBP in 160's ~ currently not requiring  antihypertensives -Pt's wife at bedside, she confirms DNR/DNI status  Objective   Blood pressure (!) 163/72, pulse 65, temperature 98.1 F (36.7 C), temperature source Oral, resp. rate 16, height 5\' 11"  (1.803 m), SpO2 100%.       No intake or output data in the 24 hours ending 03/07/23 1044 There were no vitals filed for this visit.  Examination: General: Acutely ill appearing male, sitting in bed, on room air, in NAD HENT: Atraumatic, normocephalic, neck supple, no JVD Lungs: Clear breath sounds throughout, even, nonlabored, normal effort Cardiovascular: RRR, s1s2, no M/R/G Abdomen: Soft, nontender, nondistened, no guarding or rebound tenderness, BS+ x4 Extremities: Normal bulk and tone, no deformities, no edema Neuro: Awake and alert, difficult to assess orientation due to aphasia, follows commands, 5/5 strength in all extremities (RUE may be slightly weaker ?), pupils PERRLA, should shrug is symmetric, no facial droop GU: deferred  Resolved Hospital Problem list     Assessment & Plan:   #Presented with Acute onset of Aphasia concerning for Acute CVA, s/p TNK administration (given at 10:11 am on 03/07/23) CT Head on presentation negative for acute intracranial abnormality CT Angio Head & Neck with NO significant stenosis or large vessel occlusions  -ICU monitoring -Frequent neurochecks and NIHSS scores per post-TNK protocol -Neurology following, appreciate input -Obtain stat head CT for any change in neurological exam -MRI brain pending -Repeat noncontrast head CT 24 hours post TNKase to rule out hemorrhagic conversion -No antiplatelets or anticoagulation for 24 hours post TNKase, until ICH ruled out on repeat imaging at 24 hours -Maintain SBP <180 and/or DBP <105 -As needed labetalol and nicardipine drip if needed to maintain blood pressure goal -SCDs for DVT prophylaxis -N.p.o., bedside swallow study pending -PT/OT/speech therapy consult -Check hemoglobin A1c and fasting  lipid panel -Echocardiogram is pending  #Hyponatremia #Mild Hypokalemia #Non-AG Metabolic Acidosis -Monitor I&O's / urinary output -Follow BMP -Ensure adequate renal perfusion -Avoid nephrotoxic agents as able -Replace electrolytes as indicated ~ Pharmacy following for assistance with electrolyte replacement -Gentle IV fluids: NS @ 75 ml/hr -Check serum osmolality, urine osmolality and urine Na  #Recent outpatient Catheter Associated UTI  PMHx: Prostrate cancer Urine culture on 8/6 grew E.coli and Pseudomonas, unable to complete full course of Cipro due to diarrhea, SOB, palpitations, elevated BP -Monitor fever curve -Trend WBC's & Procalcitonin -Follow cultures as above -Start empiric Cefepime pending cultures & sensitivities  #History of Prostrate cancer & Multiple Myeloma -Follow up with Oncology as outpatient    Best Practice (right click and "Reselect all SmartList Selections" daily)   Diet/type: NPO pending speech evaluation DVT prophylaxis: SCD GI prophylaxis: PPI Lines: N/A Foley:  N/A Code Status:  DNR/DNI (pt's wife confirms) Last date of multidisciplinary goals of care discussion [N/A]  8/16: Pt's wife updated at bedside.  Labs   CBC: Recent Labs  Lab 03/07/23 0925 03/07/23 1023  WBC 7.3 5.7  NEUTROABS 3.6 3.4  HGB 13.5 12.1*  HCT 38.9* 36.3*  MCV 98.2 102.0*  PLT 176 152    Basic Metabolic Panel:  Recent Labs  Lab 03/07/23 0926  NA 129*  K 3.9  CL 98  CO2 20*  GLUCOSE 100*  BUN 11  CREATININE 0.78  CALCIUM 9.0   GFR: Estimated Creatinine Clearance: 58.8 mL/min (by C-G formula based on SCr of 0.78 mg/dL). Recent Labs  Lab 03/07/23 0925 03/07/23 1023  WBC 7.3 5.7    Liver Function Tests: No results for input(s): "AST", "ALT", "ALKPHOS", "BILITOT", "PROT", "ALBUMIN" in the last 168 hours. No results for input(s): "LIPASE", "AMYLASE" in the last 168 hours. No results for input(s): "AMMONIA" in the last 168 hours.  ABG No results  found for: "PHART", "PCO2ART", "PO2ART", "HCO3", "TCO2", "ACIDBASEDEF", "O2SAT"   Coagulation Profile: Recent Labs  Lab 03/07/23 1023  INR 1.1    Cardiac Enzymes: No results for input(s): "CKTOTAL", "CKMB", "CKMBINDEX", "TROPONINI" in the last 168 hours.  HbA1C: No results found for: "HGBA1C"  CBG: Recent Labs  Lab 03/07/23 0950  GLUCAP 97    Review of Systems:   Positives in BOLD: Gen: Denies fever, chills, weight change, fatigue, night sweats HEENT: Denies blurred vision, double vision, hearing loss, tinnitus, sinus congestion, rhinorrhea, sore throat, neck stiffness, dysphagia PULM: Denies shortness of breath, cough, sputum production, hemoptysis, wheezing CV: Denies chest pain, edema, orthopnea, paroxysmal nocturnal dyspnea, palpitations GI: Denies abdominal pain, nausea, vomiting, diarrhea, hematochezia, melena, constipation, change in bowel habits GU: Denies dysuria, hematuria, polyuria, oliguria, urethral discharge Endocrine: Denies hot or cold intolerance, polyuria, polyphagia or appetite change Derm: Denies rash, dry skin, scaling or peeling skin change Heme: Denies easy bruising, bleeding, bleeding gums Neuro: Denies headache, numbness, weakness, expressive aphasia, loss of memory or consciousness   Past Medical History:  He,  has a past medical history of Anxiety, Cervicalgia, Chest pain, non-cardiac, Elevated PSA, Esophageal rupture (08/03/2021), Gallbladder polyp, Gastritis, GERD (gastroesophageal reflux disease), History of hiatal hernia, HLD (hyperlipidemia), HTN (hypertension), Hypothyroidism, Meniere disease, Nausea vomiting and diarrhea (01/26/2023), Prostate cancer (HCC) (2023), Seasonal allergies, Skin cancer, and Wears partial dentures.   Surgical History:   Past Surgical History:  Procedure Laterality Date   APPENDECTOMY     CATARACT EXTRACTION W/PHACO Right 01/15/2016   Procedure: CATARACT EXTRACTION PHACO AND INTRAOCULAR LENS PLACEMENT (IOC);   Surgeon: Sherald Hess, MD;  Location: [facility];  Service: Ophthalmology;  Laterality: Right;  RIGHT CALL CELL PHONE WITH TIME   CHOLECYSTECTOMY     COLONOSCOPY     COLONOSCOPY WITH ESOPHAGOGASTRODUODENOSCOPY (EGD)     ESOPHAGOGASTRODUODENOSCOPY  07/2021   ESOPHAGOGASTRODUODENOSCOPY (EGD) WITH PROPOFOL N/A 09/23/2017   Procedure: ESOPHAGOGASTRODUODENOSCOPY (EGD) WITH PROPOFOL;  Surgeon: Toledo, Boykin Nearing, MD;  Location: [facility];  Service: Gastroenterology;  Laterality: N/A;   HIGH INTENSITY FOCUSED ULTRASOUND (HIFU) OF THE PROSTATE N/A 02/07/2022   Procedure: HIGH INTENSITY FOCUSED ULTRASOUND (HIFU) OF THE PROSTATE;  Surgeon: Orson Ape, MD;  Location: [facility] ORS;  Service: Urology;  Laterality: N/A;   HYDROCELE EXCISION / REPAIR     IR KYPHO THORACIC WITH BONE BIOPSY  12/06/2022   IR RADIOLOGIST EVAL & MGMT  11/21/2022   LASIX RT EYE     PROSTATE BIOPSY N/A 01/03/2022   Procedure: PROSTATE BIOPSY  Addison Sanpedro;  Surgeon: Orson Ape, MD;  Location: [facility] OR[facility];  Service: Urology;  Laterality: N/A;   ROTATOR CUFF REPAIR Left 2001     Social History:   reports that he has never smoked. He has never used smokeless tobacco. He reports current alcohol use of about 7.0 standard drinks of alcohol per week.  He reports that he does not use drugs.   Family History:  His family history includes Pneumonia in his father.   Allergies Allergies  Allergen Reactions   Ciprofloxacin Diarrhea    He experienced an elevation in blood pressure, shortness of breath and diarrhea when he took the Cipro.    Amoxicillin-Pot Clavulanate Rash and Other (See Comments)    Pt states he can't recall what the reactions were   Meloxicam Rash and Other (See Comments)     Home Medications  Prior to Admission medications   Medication Sig Start Date End Date Taking? Authorizing Provider  acetaminophen (TYLENOL) 500 MG tablet Take 500 mg by mouth every 6 (six) hours as needed for mild pain,  moderate pain, fever or headache.    [provider]  acyclovir (ZOVIRAX) 400 MG tablet Take 400 mg by mouth 2 (two) times daily.    [name], MD  ALPRAZolam Prudy Feeler) 0.25 MG tablet Take 1 tablet (0.25 mg total) by mouth at bedtime as needed for anxiety. 01/16/23   [name], MD  aspirin EC 81 MG tablet Take 1 tablet (81 mg total) by mouth daily. Swallow whole. 12/13/22 03/13/23  [name], MD  dexamethasone (DECADRON) 4 MG tablet Take 2 tablets (8 mg total) by mouth daily for 30 doses. Take 2 tablets once daily for 2 days after your daratumumab treatment 02/21/23 03/23/23  [name], MD  levothyroxine (SYNTHROID, LEVOTHROID) 50 MCG tablet Take 50 mcg by mouth daily before breakfast.    [provider]  pantoprazole (PROTONIX) 40 MG tablet Take 40 mg by mouth daily as needed.    [provider]  Probiotic Product (PROBIOTIC ADVANCED PO) Take 1 tablet by mouth at bedtime.    [provider]  senna (SENOKOT) 8.6 MG tablet Take 2 tablets (17.2 mg total) by mouth 2 (two) times daily. May crush, mix with water and give sublingually if needed. 01/28/23   Delfino Lovett, MD     Critical care time: 50 minutes     Harlon Ditty, AGACNP-BC [facility] Prefer epic messenger for cross cover needs If after hours, please call E-link

## 2023-03-07 NOTE — Patient Instructions (Signed)
RN received secure chat message from lab at 9:32 am requesting a call back with patient concern. Lab reported upon arrival patient was disoriented, came from home with wife. Patients wife reports this all started ~1 hour prior. Lab reports vitals as  BP 175/73, HR 66. RN advised that MD will come to lab for patient assessment and most likely transfer to ED given symptoms. RN and Dr. Alena Bills assess patient in lab. Patient disoriented with aphasia. Patients wife reports he was fine earlier in the morning, dressed himself and symptoms started approximately 1 hour prior to assessment. MD recommends direct transfer to the ED for evaluation for possible stroke given new onset of symptoms. RN transports patient to ED via wheelchair, handoff given to ED RN.

## 2023-03-07 NOTE — Consult Note (Signed)
Pharmacy Antibiotic Note  Melvin Gilmore is a 82 y.o. male admitted on 03/07/2023 with acute ischemic stroke.  Pharmacy has been consulted for cefepime dosing for UTI.  Patient has urine culture on 8/6 that grew both E. Coli (I = cefuroxime) and Pseudomonas aeruginosa (pan-sensitive).  Plan: Start cefepime 2 grams Iv every 12 hours Follow renal function and cultures for adjustments  Height: 5\' 11"  (180.3 cm) IBW/kg (Calculated) : 75.3  Temp (24hrs), Avg:98 F (36.7 C), Min:97.9 F (36.6 C), Max:98.1 F (36.7 C)  Recent Labs  Lab 03/07/23 0925 03/07/23 0926 03/07/23 1023  WBC 7.3  --  5.7  CREATININE  --  0.78 0.83    Estimated Creatinine Clearance: 56.7 mL/min (by C-G formula based on SCr of 0.83 mg/dL).    Allergies  Allergen Reactions   Ciprofloxacin Diarrhea    He experienced an elevation in blood pressure, shortness of breath and diarrhea when he took the Cipro.    Amoxicillin-Pot Clavulanate Rash and Other (See Comments)    Pt states he can't recall what the reactions were   Meloxicam Rash and Other (See Comments)    Antimicrobials this admission: Cefepime 8/16 >>    Dose adjustments this admission: N/A  Microbiology results: 8/16 UCx: pending    Thank you for allowing pharmacy to be a part of this patient's care.  Barrie Folk, PharmD 03/07/2023 11:42 AM

## 2023-03-07 NOTE — Procedures (Signed)
History: 82 year old male who presented with acute aphasia  Sedation: None  Technique: This EEG was acquired with electrodes placed according to the International 10-20 electrode system (including Fp1, Fp2, F3, F4, C3, C4, P3, P4, O1, O2, T3, T4, T5, T6, A1, A2, Fz, Cz, Pz). The following electrodes were missing or displaced: none.  Patient State: Awake and drowsy  Background: There is a posterior dominant rhythm of 8 to 9 Hz.  In addition, there is generalized frontally predominant irregular slow activity throughout the recording.  This pattern was present throughout the recording.  Photic stimulation: Physiologic driving is not performed  EEG Abnormalities: 1) generalized irregular slow activity  Clinical Interpretation: This EEG is consistent with a mild generalized nonspecific cerebral dysfunction (encephalopathy).   There was no seizure or seizure predisposition recorded on this study. Please note that lack of epileptiform activity on EEG does not preclude the possibility of epilepsy.   Ritta Slot, MD [facility] [phone]  If 7pm- 7am, please page neurology on call as listed in AMION.

## 2023-03-07 NOTE — Progress Notes (Signed)
PT Cancellation Note  Patient Details Name: Melvin Gilmore MRN: [id number] DOB: [birth year]   Cancelled Treatment:    Reason Eval/Treat Not Completed: Patient not medically ready Consult received and chart reviewed.  Patient admitted to ICU for CVA work up with TNK (at 10:11, 03/07/23).  Per guidelines, to be on strict bedrest x24 hours and follow up imaging required to be completed prior to initiation of therapy.  Will continue to follow and initiate as medically appropriate.   Maylon Peppers, PT, DPT Physical Therapist - [facility]  [facility]  Kaysie Michelini A Paulette Rockford 03/07/2023, 12:16 PM

## 2023-03-07 NOTE — Progress Notes (Signed)
Eeg done 

## 2023-03-07 NOTE — ED Notes (Signed)
Neurologist made aware of pt stating "yes" to being asked if he had a headache, per neurologist continue to monitor.

## 2023-03-07 NOTE — Progress Notes (Signed)
OT Cancellation Note  Patient Details Name: Melvin Gilmore MRN: [id number] DOB: May 18, 1941   Cancelled Treatment:    Reason Eval/Treat Not Completed: Patient not medically ready. Consult received and chart reviewed.  Patient admitted to ICU for CVA work up with TNK (at 10:11, 03/07/23).  Per guidelines, to be on strict bedrest x24 hours and follow up imaging required to be completed prior to initiation of therapy.  Will continue to follow and initiate as medically appropriate.  Kathie Dike, M.S. OTR/L  03/07/23, 12:14 PM  ascom [phone]

## 2023-03-07 NOTE — ED Triage Notes (Signed)
Arrives from [facility].  C/O disorientation and aphasia.  [id number]

## 2023-03-07 NOTE — Progress Notes (Signed)
[facility] CONSULT NOTE  Patient Care Team: [name], MD as PCP - General (Internal Medicine) [name], MD as Consulting Physician (Oncology)  CANCER STAGING   Cancer Staging  Multiple myeloma [facility]) Staging form: Plasma Cell Myeloma and Plasma Cell Disorders, AJCC 8th Edition - Clinical stage from 12/05/2022: RISS Stage II (Beta-2-microglobulin (mg/L): 3.7, Albumin (g/dL): 3.6, ISS: Stage II, High-risk cytogenetics: Absent, LDH: Normal) - Signed by [name], MD on 12/26/2022 Stage prefix: Initial diagnosis Beta 2 microglobulin range (mg/L): 3.5 to 5.49 Albumin range (g/dL): Greater than or equal to 3.5 Cytogenetics: 1q addition, Other mutation   ASSESSMENT & PLAN:  [name] 82 y.o. male with pmh of hypertension, hyperlipidemia, anxiety, GERD, BPH, hypothyroidism, prostate cancer s/p HIFU was seen by primary on November 13, 2022 for acute onset of lower back pain.  Was referred to medical oncology for finding of L1 lesion suspicious for metastasis.  # Aphasia -Acute.  Symptoms started about an hour.  He was seen in the lab room.  Concern for stroke.  He is also being treated for UTI by urology also concern for encephalopathy from sepsis.  Will send him to ER stat  # Multiple myeloma, RISS Stage II -MRI thoracic and lumbar spine with and without contrast was reviewed. Showed unchanged subacute compression fracture at T12.  Focal enhancing marrow replacing lesions in T6, T8, T5, T2 all suspicious for metastatic disease.  L1 lesion measuring 16 mm.  Multilevel degenerative changes present.  -SPEP/IFE showed biclonal IgA kappa paraprotein with M spike 2.1 g/dL and second spike at 0.4 g/dL.  Kappa 453, lambda 6.8 with a ratio of 66.7.  Iron panel and B12 are normal.  PSA 3.44.  CBC showed mild anemia 13.4, chronic.  On CMP normal creatinine and calcium levels.  Elevated total protein 8.8.  LDH normal.  Beta-2 microglobulin 3.7.  Albumin 3.6.  24-hour  urine/UPEP showed 1.1 g protein with no M spike.  -BMBx Hypercellular marrow with 60% plasma cells.  IHC positive for CD138/ mum 1.  Kappa restricted consistent with multiple myeloma. L1 vertebral body biopsy showed numerous plasma cells. Cytogenetics - del (13q) and gain 1.    -PET/CT showed activity in L1 and left eighth rib concerning for myeloma.  -Started on Dara-RD on 12/26/2022.  Completed cycle 1 of daratumumab -> then hospitalized for urinary retention, constipation and back pain -> inpatient hospitalist team discussed with patient and transition him to hospice -> patient revoked hospice on 01/31/2023 since he was feeling better.  -Cycle 2 day 8 daratumumab on 8/24.  No treatment today.  Concern for stroke.  -Revlimid 10 mg could not tolerate due to neutropenia.  Dose reduced to 2.5 mg 3 weeks on 1 week off due to neutropenia.  His Revlimid has been on hold for about 3 weeks.  -Showing response to treatment.  Kappa chains decreasing and M spike decreased from 2.1 to 0.2.   # Acute urinary retention likely secondary to stool burden -Follows up with Dr. Lonna Cobb.  Catheter removed.  # Intractable hiccups -Secondary to dexamethasone. -Will try regimen of 8 mg on day 1 and 2 and see if he tolerates better.  # Vertebral compression fractures - Advised to obtain dental clearance prior to starting Zometa  # T12 pathological fracture # Cancer-related pain - s/p kyphoplasty by Dr. Elijio Miles on 12/06/22.  No improvement in pain -Completed palliative RT on 01/20/2023.  Denies any improvement yet. -Patient started on MS Contin 15 mg twice  daily.  But only taking at night and mentions pain is better controlled.  # Opioid-induced constipation -Manageable with senna, MiraLAX, lactulose, Movantik as needed.  # History of prostate cancer - follows with Dr. Evelene Croon, urology for elevated PSA.  MRI prostate showed 1 mm category 5 lesion of anterior transition zone.  Underwent fusion biopsy showed Gleason  score 3+4 adenocarcinoma involving left anterior 4/4 cores. s/p HIFU of the prostate on 02/07/2022.  PSA level from 10/08/2022 was 3.34.  -PSA from 11/21/2022 3.44.  # Poor appetite # Weight loss -Could be from cancer and uncontrolled pain.  Pain management as above. Nutrition following. -He is taking Marinol 2.5 mg once daily.   # Anxiety -On longstanding Xanax 0.25 mg at night as needed.  -Anxiety has been heightened with the recent diagnosis, pain, poor appetite.  No orders of the defined types were placed in this encounter.   The total time spent in the appointment was 25 minutes encounter with patients including review of chart and various tests results, discussions about plan of care and coordination of care plan   All questions were answered. The patient knows to call the clinic with any problems, questions or concerns. No barriers to learning was detected.  [name], MD 8/16/202411:14 AM   HISTORY OF PRESENTING ILLNESS:  Melvin Gilmore 82 y.o. male with past medical history of hypertension, hyperlipidemia, anxiety, GERD, BPH, hypothyroidism, prostate cancer s/p HIFU was seen by primary on November 13, 2022 for acute onset of lower back pain.  Patient reports that he was sitting outside and had violent sneeze which was followed by severe back pain.  He has fallen allergies.  Had x-rays followed by MRI thoracic spine without contrast.  It showed compression fracture of T12 superior endplate with surrounding marrow edema likely subacute.  15 mm T1 hypointense marrow lesion in the anterior aspect of L1 suspicious for metastasis.  Further characterization with postcontrast MRI recommended.  Prostate cancer-follows with Dr. Evelene Croon, urology for elevated PSA.  MRI prostate showed 1 mm category 5 lesion of anterior transition zone.  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 s/p HIFU of the prostate on 02/07/2022.  PSA level from  10/08/2022 was 3.34.  Interval history Patient was seen today in the lab room due to concerns from the staff about his altered mentation and speech. Wife reports that his symptoms started this morning about an hour ago.  On questioning, his speech was very aphasic, unable to understand.  Discussed with the wife and his son that patient needs to be taken to ER stat to rule out any stroke or encephalopathy from sepsis.  He has been treated for UTI for about a week or so now.  Patient and family agreeable with the plan.  I have reviewed his chart and materials related to his cancer extensively and collaborated history with the patient. Summary of oncologic history is as follows: Oncology History  Multiple myeloma (HCC)  12/05/2022 Cancer Staging   Staging form: Plasma Cell Myeloma and Plasma Cell Disorders, AJCC 8th Edition - Clinical stage from 12/05/2022: RISS Stage II (Beta-2-microglobulin (mg/L): 3.7, Albumin (g/dL): 3.6, ISS: Stage II, High-risk cytogenetics: Absent, LDH: Normal) - Signed by [name], MD on 12/26/2022 Stage prefix: Initial diagnosis Beta 2 microglobulin range (mg/L): 3.5 to 5.49 Albumin range (g/dL): Greater than or equal to 3.5 Cytogenetics: 1q addition, Other mutation   12/13/2022 Initial Diagnosis   Multiple myeloma (HCC)   12/26/2022 - 01/24/2023 Chemotherapy  Patient is on Treatment Plan : MYELOMA NEWLY DIAGNOSED Daratumumab IV + Lenalidomide + Dexamethasone Weekly (DaraRd) q28d     02/07/2023 - 02/07/2023 Chemotherapy   Patient is on Treatment Plan : MYELOMA NON-TRANSPLANT CANDIDATES VRd weekly q21d     02/13/2023 -  Chemotherapy   Patient is on Treatment Plan : MYELOMA NEWLY DIAGNOSED Daratumumab IV + Lenalidomide + Dexamethasone Weekly (DaraRd) q28d       MEDICAL HISTORY:  Past Medical History:  Diagnosis Date   Anxiety    a.) on BZO (alprazolam) PRN   Cervicalgia    Chest pain, non-cardiac    Elevated PSA    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   Distal with  moderate free air surrounding Hiatal Hernia   Gallbladder polyp    Gastritis    GERD (gastroesophageal reflux disease)    History of hiatal hernia    HLD (hyperlipidemia)    HTN (hypertension)    Hypothyroidism    Meniere disease    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   Nausea vomiting and diarrhea 01/26/2023   Prostate cancer (HCC) 2023   Seasonal allergies    Skin cancer    a.) ears   Wears partial dentures    lower    SURGICAL HISTORY: Past Surgical History:  Procedure Laterality Date   APPENDECTOMY     CATARACT EXTRACTION W/PHACO Right 01/15/2016   Procedure: CATARACT EXTRACTION PHACO AND INTRAOCULAR LENS PLACEMENT (IOC);  Surgeon: Sherald Hess, MD;  Location: [facility];  Service: Ophthalmology;  Laterality: Right;  RIGHT CALL CELL PHONE WITH TIME   CHOLECYSTECTOMY     COLONOSCOPY     COLONOSCOPY WITH ESOPHAGOGASTRODUODENOSCOPY (EGD)     ESOPHAGOGASTRODUODENOSCOPY  07/2021   ESOPHAGOGASTRODUODENOSCOPY (EGD) WITH PROPOFOL N/A 09/23/2017   Procedure: ESOPHAGOGASTRODUODENOSCOPY (EGD) WITH PROPOFOL;  Surgeon: Toledo, Boykin Nearing, MD;  Location: [facility];  Service: Gastroenterology;  Laterality: N/A;   HIGH INTENSITY FOCUSED ULTRASOUND (HIFU) OF THE PROSTATE N/A 02/07/2022   Procedure: HIGH INTENSITY FOCUSED ULTRASOUND (HIFU) OF THE PROSTATE;  Surgeon: Orson Ape, MD;  Location: [facility];  Service: Urology;  Laterality: N/A;   HYDROCELE EXCISION / REPAIR     IR KYPHO THORACIC WITH BONE BIOPSY  12/06/2022   IR RADIOLOGIST EVAL & MGMT  11/21/2022   LASIX RT EYE     PROSTATE BIOPSY N/A 01/03/2022   Procedure: PROSTATE BIOPSY  Addison Paccione;  Surgeon: Orson Ape, MD;  Location: [facility];  Service: Urology;  Laterality: N/A;   ROTATOR CUFF REPAIR Left 2001    SOCIAL HISTORY: Social History   Socioeconomic History   Marital status: Married    Spouse name: Not on file   Number of children: Not on file   Years of education: Not on file    Highest education level: Not on file  Occupational History   Not on file  Tobacco Use   Smoking status: Never   Smokeless tobacco: Never  Vaping Use   Vaping status: Never Used  Substance and Sexual Activity   Alcohol use: Yes    Alcohol/week: 7.0 standard drinks of alcohol    Types: 7 Glasses of wine per week    Comment: wine occ   Drug use: No   Sexual activity: Not on file  Other Topics Concern   Not on file  Social History Narrative   Not on file   Social Determinants of Health   Financial Resource Strain: Low Risk  (11/21/2022)   Overall Financial Resource  Strain (CARDIA)    Difficulty of Paying Living Expenses: Not hard at all  Food Insecurity: Patient Unable To Answer (03/07/2023)   Hunger Vital Sign    Worried About Running Out of Food in the Last Year: Patient unable to answer    Ran Out of Food in the Last Year: Patient unable to answer  Transportation Needs: Patient Unable To Answer (03/07/2023)   PRAPARE - Transportation    Lack of Transportation (Medical): Patient unable to answer    Lack of Transportation (Non-Medical): Patient unable to answer  Physical Activity: Not on file  Stress: Not on file  Social Connections: Not on file  Intimate Partner Violence: Patient Unable To Answer (03/07/2023)   Humiliation, Afraid, Rape, and Kick questionnaire    Fear of Current or Ex-Partner: Patient unable to answer    Emotionally Abused: Patient unable to answer    Physically Abused: Patient unable to answer    Sexually Abused: Patient unable to answer    FAMILY HISTORY: Family History  Problem Relation Age of Onset   Pneumonia Father     ALLERGIES:  is allergic to ciprofloxacin, amoxicillin-pot clavulanate, and meloxicam.  MEDICATIONS:  No current facility-administered medications for this visit.   Current Outpatient Medications  Medication Sig Dispense Refill   acetaminophen (TYLENOL) 500 MG tablet Take 500 mg by mouth every 6 (six) hours as needed for mild  pain, moderate pain, fever or headache.     acyclovir (ZOVIRAX) 400 MG tablet Take 400 mg by mouth 2 (two) times daily.     ALPRAZolam (XANAX) 0.25 MG tablet Take 1 tablet (0.25 mg total) by mouth at bedtime as needed for anxiety. 30 tablet 0   aspirin EC 81 MG tablet Take 1 tablet (81 mg total) by mouth daily. Swallow whole. 90 tablet 2   dexamethasone (DECADRON) 4 MG tablet Take 2 tablets (8 mg total) by mouth daily for 30 doses. Take 2 tablets once daily for 2 days after your daratumumab treatment 30 tablet 0   levothyroxine (SYNTHROID, LEVOTHROID) 50 MCG tablet Take 50 mcg by mouth daily before breakfast.     pantoprazole (PROTONIX) 40 MG tablet Take 40 mg by mouth daily as needed.     Probiotic Product (PROBIOTIC ADVANCED PO) Take 1 tablet by mouth at bedtime.     senna (SENOKOT) 8.6 MG tablet Take 2 tablets (17.2 mg total) by mouth 2 (two) times daily. May crush, mix with water and give sublingually if needed. 28 tablet 0   Facility-Administered Medications Ordered in Other Visits  Medication Dose Route Frequency Provider Last Rate Last Admin    stroke: early stages of recovery book   Does not apply Once Rejeana Brock, MD       0.9 %  sodium chloride infusion   Intravenous Continuous Rejeana Brock, MD       0.9 %  sodium chloride infusion   Intravenous Continuous Harlon Ditty D, NP       acetaminophen (TYLENOL) tablet 650 mg  650 mg Oral Q4H PRN Rejeana Brock, MD       Or   acetaminophen (TYLENOL) 160 MG/5ML solution 650 mg  650 mg Per Tube Q4H PRN Rejeana Brock, MD       Or   acetaminophen (TYLENOL) suppository 650 mg  650 mg Rectal Q4H PRN Rejeana Brock, MD       labetalol (NORMODYNE) injection 20 mg  20 mg Intravenous Once PRN Rejeana Brock, MD  And   clevidipine (CLEVIPREX) infusion 0.5 mg/mL  0-21 mg/hr Intravenous Continuous PRN Rejeana Brock, MD       pantoprazole (PROTONIX) injection 40 mg  40 mg Intravenous  QHS Rejeana Brock, MD        REVIEW OF SYSTEMS:   Pertinent information mentioned in HPI All other systems were reviewed with the patient and are negative.  PHYSICAL EXAMINATION: ECOG PERFORMANCE STATUS: 1 - Symptomatic but completely ambulatory  There were no vitals filed for this visit.    There were no vitals filed for this visit.     GENERAL:alert, no distress and comfortable SKIN: skin color, texture, turgor are normal, no rashes or significant lesions EYES: normal, conjunctiva are pink and non-injected, sclera clear OROPHARYNX:no exudate, no erythema and lips, buccal mucosa, and tongue normal  NECK: supple, thyroid normal size, non-tender, without nodularity LYMPH:  no palpable lymphadenopathy in the cervical, axillary or inguinal LUNGS: clear to auscultation and percussion with normal breathing effort HEART: regular rate & rhythm and no murmurs and no lower extremity edema ABDOMEN:abdomen soft, non-tender and normal bowel sounds Musculoskeletal:no cyanosis of digits and no clubbing  PSYCH: alert & oriented x 3 with fluent speech NEURO: no focal motor/sensory deficits  LABORATORY DATA:  I have reviewed the data as listed Lab Results  Component Value Date   WBC 5.7 03/07/2023   HGB 12.1 (L) 03/07/2023   HCT 36.3 (L) 03/07/2023   MCV 102.0 (H) 03/07/2023   PLT 152 03/07/2023   Recent Labs    02/07/23 1141 02/21/23 0916 03/07/23 0926 03/07/23 1023  NA 125* 129* 129* 126*  K 3.7 3.3* 3.9 3.4*  CL 93* 98 98 97*  CO2 23 23 20* 17*  GLUCOSE 130* 113* 100* 105*  BUN 14 12 11 10   CREATININE 0.85 0.92 0.78 0.83  CALCIUM 8.4* 8.7* 9.0 8.5*  GFRNONAA >60 >60 >60 >60  PROT 6.3* 5.8*  --  6.1*  ALBUMIN 3.5 3.5  --  3.6  AST 32 32  --  29  ALT 31 32  --  21  ALKPHOS 103 91  --  78  BILITOT 1.0 0.8  --  1.2    RADIOGRAPHIC STUDIES: I have personally reviewed the radiological images as listed and agreed with the findings in the report. CT HEAD CODE  STROKE WO CONTRAST  Result Date: 03/07/2023 CLINICAL DATA:  Code stroke. Disorientation, right hemianopsia, and aphasia EXAM: CT ANGIOGRAPHY HEAD AND NECK WITH AND WITHOUT CONTRAST TECHNIQUE: Multidetector CT imaging of the head and neck was performed using the standard protocol during bolus administration of intravenous contrast. Multiplanar CT image reconstructions and MIPs were obtained to evaluate the vascular anatomy. Carotid stenosis measurements (when applicable) are obtained utilizing NASCET criteria, using the distal internal carotid diameter as the denominator. RADIATION DOSE REDUCTION: This exam was performed according to the departmental dose-optimization program which includes automated exposure control, adjustment of the mA and/or kV according to patient size and/or use of iterative reconstruction technique. CONTRAST:  75mL OMNIPAQUE IOHEXOL 350 MG/ML SOLN COMPARISON:  CT head 08/21/2022 FINDINGS: CT HEAD FINDINGS Brain: There is no acute intracranial hemorrhage, extra-axial fluid collection, or acute infarct. Parenchymal volume is within expected limits for age. The ventricles are normal in size. Gray-white differentiation is preserved The pituitary and suprasellar region are normal. There is no mass lesion. There is no mass effect or midline shift. Vascular: No hyperdense vessel or unexpected calcification. Skull: Normal. Negative for fracture or focal lesion. Sinuses/Orbits: The imaged  paranasal sinuses are clear. A right lens implant is noted. The globes and orbits are otherwise unremarkable. Other: The mastoid air cells and middle ear cavities are clear. ASPECTS [facility] Stroke Program Early CT Score) - Ganglionic level infarction (caudate, lentiform nuclei, internal capsule, insula, M1-M3 cortex): 7 - Supraganglionic infarction (M4-M6 cortex): 3 Total score (0-10 with 10 being normal): 10 CTA NECK FINDINGS Aortic arch: There is a minimal calcified plaque in the imaged aortic arch. The origins  of the major branch vessels are patent. The subclavian arteries are patent to the level imaged. Right carotid system: The right common, internal, and external carotid arteries are patent, with minimal plaque at the bifurcation but no hemodynamically significant stenosis or occlusion. There is no evidence of dissection or aneurysm. Left carotid system: The left common, internal, and external carotid arteries are patent, with minimal plaque at the bifurcation but no hemodynamically significant stenosis or occlusion. There is no evidence of dissection or aneurysm. Vertebral arteries: The vertebral arteries are patent, without hemodynamically significant stenosis or occlusion there is no evidence of dissection or aneurysm. Skeleton: There is mild compression deformity of the superior T3 and T4 endplates, similar to the prior thoracic spine MRI. There are diffuse small lucencies throughout the imaged spine consistent with myeloma lesions. There is no definite soft tissue component within the spinal canal. Other neck: The soft tissues of the neck are unremarkable. Upper chest: There is scarring in both lung apices. Review of the MIP images confirms the above findings CTA HEAD FINDINGS Anterior circulation: There is mild calcified plaque in the intracranial ICAs without significant stenosis or occlusion. The bilateral MCAs and ACAS are patent, without proximal stenosis or occlusion. There is a common A2 trunk, a normal variant. There is no aneurysm or AVM. Posterior circulation: There is diminutive enhancement of the right V4 segment, favored predominantly congenital with possible superimposed atherosclerosis. The left V4 segment is patent. The basilar artery is patent. The major cerebellar arteries are patent. The bilateral PCAs are patent, predominantly supplied by posterior communicating arteries bilaterally (fetal origins). There is no proximal stenosis or occlusion. There is no aneurysm or AVM. Venous sinuses:  Suboptimally evaluated due to bolus timing. Anatomic variants: As above. Review of the MIP images confirms the above findings IMPRESSION: 1. No acute intracranial pathology. 2. Patent vasculature of the head and neck, with no hemodynamically significant stenosis, occlusion, or dissection. 3. Diffuse lucencies throughout the imaged spine consistent with myeloma lesions. Findings of the initial noncontrast head CT communicated to Dr Amada Jupiter at 10:00 am. The CTA results were discussed at approximately 10:15 am. Electronically Signed   By: Lesia Hausen M.D.   On: 03/07/2023 10:28   CT ANGIO HEAD NECK W WO CM (CODE STROKE)  Result Date: 03/07/2023 CLINICAL DATA:  Code stroke. Disorientation, right hemianopsia, and aphasia EXAM: CT ANGIOGRAPHY HEAD AND NECK WITH AND WITHOUT CONTRAST TECHNIQUE: Multidetector CT imaging of the head and neck was performed using the standard protocol during bolus administration of intravenous contrast. Multiplanar CT image reconstructions and MIPs were obtained to evaluate the vascular anatomy. Carotid stenosis measurements (when applicable) are obtained utilizing NASCET criteria, using the distal internal carotid diameter as the denominator. RADIATION DOSE REDUCTION: This exam was performed according to the departmental dose-optimization program which includes automated exposure control, adjustment of the mA and/or kV according to patient size and/or use of iterative reconstruction technique. CONTRAST:  75mL OMNIPAQUE IOHEXOL 350 MG/ML SOLN COMPARISON:  CT head 08/21/2022 FINDINGS: CT HEAD FINDINGS Brain: There is  no acute intracranial hemorrhage, extra-axial fluid collection, or acute infarct. Parenchymal volume is within expected limits for age. The ventricles are normal in size. Gray-white differentiation is preserved The pituitary and suprasellar region are normal. There is no mass lesion. There is no mass effect or midline shift. Vascular: No hyperdense vessel or unexpected  calcification. Skull: Normal. Negative for fracture or focal lesion. Sinuses/Orbits: The imaged paranasal sinuses are clear. A right lens implant is noted. The globes and orbits are otherwise unremarkable. Other: The mastoid air cells and middle ear cavities are clear. ASPECTS [facility] Stroke Program Early CT Score) - Ganglionic level infarction (caudate, lentiform nuclei, internal capsule, insula, M1-M3 cortex): 7 - Supraganglionic infarction (M4-M6 cortex): 3 Total score (0-10 with 10 being normal): 10 CTA NECK FINDINGS Aortic arch: There is a minimal calcified plaque in the imaged aortic arch. The origins of the major branch vessels are patent. The subclavian arteries are patent to the level imaged. Right carotid system: The right common, internal, and external carotid arteries are patent, with minimal plaque at the bifurcation but no hemodynamically significant stenosis or occlusion. There is no evidence of dissection or aneurysm. Left carotid system: The left common, internal, and external carotid arteries are patent, with minimal plaque at the bifurcation but no hemodynamically significant stenosis or occlusion. There is no evidence of dissection or aneurysm. Vertebral arteries: The vertebral arteries are patent, without hemodynamically significant stenosis or occlusion there is no evidence of dissection or aneurysm. Skeleton: There is mild compression deformity of the superior T3 and T4 endplates, similar to the prior thoracic spine MRI. There are diffuse small lucencies throughout the imaged spine consistent with myeloma lesions. There is no definite soft tissue component within the spinal canal. Other neck: The soft tissues of the neck are unremarkable. Upper chest: There is scarring in both lung apices. Review of the MIP images confirms the above findings CTA HEAD FINDINGS Anterior circulation: There is mild calcified plaque in the intracranial ICAs without significant stenosis or occlusion. The bilateral  MCAs and ACAS are patent, without proximal stenosis or occlusion. There is a common A2 trunk, a normal variant. There is no aneurysm or AVM. Posterior circulation: There is diminutive enhancement of the right V4 segment, favored predominantly congenital with possible superimposed atherosclerosis. The left V4 segment is patent. The basilar artery is patent. The major cerebellar arteries are patent. The bilateral PCAs are patent, predominantly supplied by posterior communicating arteries bilaterally (fetal origins). There is no proximal stenosis or occlusion. There is no aneurysm or AVM. Venous sinuses: Suboptimally evaluated due to bolus timing. Anatomic variants: As above. Review of the MIP images confirms the above findings IMPRESSION: 1. No acute intracranial pathology. 2. Patent vasculature of the head and neck, with no hemodynamically significant stenosis, occlusion, or dissection. 3. Diffuse lucencies throughout the imaged spine consistent with myeloma lesions. Findings of the initial noncontrast head CT communicated to Dr Amada Jupiter at 10:00 am. The CTA results were discussed at approximately 10:15 am. Electronically Signed   By: Lesia Hausen M.D.   On: 03/07/2023 10:28

## 2023-03-08 ENCOUNTER — Inpatient Hospital Stay (HOSPITAL_COMMUNITY)
Admit: 2023-03-08 | Discharge: 2023-03-08 | Disposition: A | Discharge status: Home / Self Care | Payer: Medicare HMO | Attending: Neurology | Admitting: Neurology

## 2023-03-08 ENCOUNTER — Inpatient Hospital Stay: Payer: Medicare HMO

## 2023-03-08 DIAGNOSIS — I6389 Other cerebral infarction: Secondary | ICD-10-CM | POA: Diagnosis not present

## 2023-03-08 DIAGNOSIS — I639 Cerebral infarction, unspecified: Secondary | ICD-10-CM | POA: Diagnosis not present

## 2023-03-08 LAB — ECHOCARDIOGRAM COMPLETE
AR max vel: 2.66 cm2
AV Area VTI: 2.8 cm2
AV Area mean vel: 2.52 cm2
AV Mean grad: 2 mmHg
AV Peak grad: 3.5 mmHg
Ao pk vel: 0.94 m/s
Area-P 1/2: 3.1 cm2
Height: 71 in
S' Lateral: 3.1 cm
Weight: 2096 [oz_av]

## 2023-03-08 LAB — LIPID PANEL
Cholesterol: 229 mg/dL — ABNORMAL HIGH (ref 0–200)
HDL: 61 mg/dL (ref >40)
LDL Cholesterol: 155 mg/dL — ABNORMAL HIGH (ref 0–99)
Total CHOL/HDL Ratio: 3.8 ratio
Triglycerides: 64 mg/dL (ref <150)
VLDL: 13 mg/dL (ref 0–40)

## 2023-03-08 LAB — RENAL FUNCTION PANEL
Albumin: 3.3 g/dL — ABNORMAL LOW (ref 3.5–5.0)
Anion gap: 6 (ref 5–15)
BUN: 9 mg/dL (ref 8–23)
CO2: 21 mmol/L — ABNORMAL LOW (ref 22–32)
Calcium: 8.9 mg/dL (ref 8.9–10.3)
Chloride: 104 mmol/L (ref 98–111)
Creatinine, Ser: 0.77 mg/dL (ref 0.61–1.24)
GFR, Estimated: >60 mL/min (ref >60)
Glucose, Bld: 88 mg/dL (ref 70–99)
Phosphorus: 3.3 mg/dL (ref 2.5–4.6)
Potassium: 4.6 mmol/L (ref 3.5–5.1)
Sodium: 131 mmol/L — ABNORMAL LOW (ref 135–145)

## 2023-03-08 LAB — URINE CULTURE: Culture: NO GROWTH

## 2023-03-08 MED ORDER — ATORVASTATIN CALCIUM 20 MG PO TABS
40.0000 mg | ORAL_TABLET | Freq: Every day | ORAL | Status: DC
  Administered 2023-03-08: 40 mg via ORAL
  Filled 2023-03-08: qty 2

## 2023-03-08 MED ORDER — CLOPIDOGREL BISULFATE 75 MG PO TABS
300.0000 mg | ORAL_TABLET | Freq: Once | ORAL | Status: AC
  Administered 2023-03-08: 300 mg via ORAL
  Filled 2023-03-08: qty 4

## 2023-03-08 MED ORDER — PANTOPRAZOLE SODIUM 40 MG PO TBEC: 40.0000 mg | DELAYED_RELEASE_TABLET | Freq: Every day | ORAL | Status: DC

## 2023-03-08 MED ORDER — CLOPIDOGREL BISULFATE 75 MG PO TABS: 75.0000 mg | ORAL_TABLET | Freq: Every day | ORAL | 0 refills | Status: AC

## 2023-03-08 MED ORDER — ATORVASTATIN CALCIUM 40 MG PO TABS: 40.0000 mg | ORAL_TABLET | Freq: Every day | ORAL | 2 refills | Status: DC

## 2023-03-08 NOTE — Plan of Care (Signed)

## 2023-03-08 NOTE — Progress Notes (Incomplete)
{  Select_[email or web address]:[id number]} 

## 2023-03-08 NOTE — Progress Notes (Signed)
Subjective: Feels almost back to normal  Exam: Vitals:   03/08/23 0800 03/08/23 0900  BP: (!) 152/72 126/73  Pulse: (!) 53 69  Resp: (!) 26 18  Temp: 98.1 F (36.7 C)   SpO2: 99% 100%   Gen: In bed, NAD Resp: non-labored breathing, no acute distress Abd: soft, nt  Neuro: MS: Awake, alert, able to name objects, able to repeat, but possible very mild difficulty with repetition CN: Visual fields full, EOMI, face symmetric Motor: No drift, 5/5 throughout Sensory: Intact to light touch  Pertinent Labs: LDL 155 Sodium 131 Creatinine 0.77 A1c 4.3   Impression: 82 year old male with acute onset aphasia and right hemianopia with marked improvement following IV tenecteplase.  Given lack of LVO, EEG was performed which was negative, and I very much suspect this was stroke either aborted with TNK therapy versus simply markedly improved.  Recommendations: -High intensity statin with atorvastatin 40 mg daily - MRI of the brain without contrast - Frequent neuro checks - Echocardiogram -If MRI is negative for bleeding prophylactic therapy with aspirin - dose 81mg  and plavix 75mg  daily  after 300mg  load  - Risk factor modification - Telemetry monitoring - PT consult, OT consult, Speech consult -If no etiology is discovered prior to discharge, I would favor prolonged cardiac monitoring   Ritta Slot, MD [facility] [phone]  If 7pm- 7am, please page neurology on call as listed in [facility]ION.

## 2023-03-08 NOTE — Discharge Summary (Signed)
Physician Discharge Summary   Melvin Gilmore  male DOB: 1941-04-13  ONG:[id number]  PCP: Melvin Arbour, MD  Admit date: 03/07/2023 Discharge date: 03/08/2023  Admitted From: home Disposition:  home CODE STATUS: DNR  Discharge Instructions     Ambulatory referral to Cardiology   Complete by: As directed    Need implanted loop recorder for long-term cardiac monitoring   Diet - low sodium heart healthy   Complete by: As directed    Discharge instructions   Complete by: As directed    Please taking aspirin 81 mg and plavix together for 21 days, and then just aspirin 81 mg after that.  We started you on cholesterol medication Lipitor.  Please follow up with cardiology Dr. Juliann Gilmore for implanted loop recorder for long-term cardiac monitoring. [facility] Course:  For full details, please see H&P, progress notes, consult notes and ancillary notes.  Briefly,  Melvin Gilmore is a pleasant 82 year old male presenting with acute onset aphasia and confusion, with history of palpitations suggestive of atrial fibrillation and recent UTI (with urine cultures growing E. Coli and Pseudomonas).   CT head and CTA of the head and neck were negative, but given acute onset symptoms high suspicion for cerebrovascular accident for which IV tenecteplase was administered. Patient was admitted to the ICU for close monitoring of neurological status.   # Acute onset aphasia and right hemianopia  marked improvement following IV tenecteplase.  --MRI brain neg for acute finding.  Echo without thrombus or atrial level shunt. --Neuro rec ASA and plavix for 21 days and then ASA alone.  Pt started on Lipitor 40 mg daily.  Pt will follow up with cardio Dr. Juliann Gilmore for implanted loop recorder for long-term cardiac monitoring.  Dr. Juliann Gilmore acknowledged this request.   #Hyponatremia, chronic #Mild Hypokalemia #Non-AG Metabolic Acidosis --received gentle MIVF.   #Recent outpatient Catheter  Associated UTI  Urine culture on 8/6 grew E.coli and Pseudomonas, unable to complete full course of Cipro due to diarrhea, SOB, palpitations, elevated BP. -Started on empiric Cefepime on admission.  UA neg nitrite, small leuk and no bacteria.  Pt denied urinary symptoms.  Abx was not continued.   #History of Prostrate cancer & Multiple Myeloma -Follow up with Oncology as outpatient   Discharge Diagnoses:  Principal Problem:   Acute ischemic stroke [facility]) Active Problems:   Urinary tract infection without hematuria   30 Day Unplanned Readmission Risk Score    Flowsheet Row ED to Hosp-Admission (Current) from 03/07/2023 in [facility] ICU/CCU  30 Day Unplanned Readmission Risk Score (%) 16.7 Filed at 03/08/2023 1600       This score is the patient's risk of an unplanned readmission within 30 days of being discharged (0 -100%). The score is based on dignosis, age, lab data, medications, orders, and past utilization.   Low:  0-14.9   Medium: 15-21.9   High: 22-29.9   Extreme: 30 and above         Discharge Instructions:  Allergies as of 03/08/2023       Reactions   Ciprofloxacin Diarrhea   He experienced an elevation in blood pressure, shortness of breath and diarrhea when he took the Cipro.    Amoxicillin-pot Clavulanate Rash, Other (See Comments)   Pt states he can't recall what the reactions were   Meloxicam Rash, Other (See Comments)        Medication List     TAKE these medications  acetaminophen 500 MG tablet Commonly known as: TYLENOL Take 500 mg by mouth every 6 (six) hours as needed for mild pain, moderate pain, fever or headache.   acyclovir 400 MG tablet Commonly known as: ZOVIRAX Take 400 mg by mouth 2 (two) times daily.   ALPRAZolam 0.25 MG tablet Commonly known as: XANAX Take 1 tablet (0.25 mg total) by mouth at bedtime as needed for anxiety.   aspirin EC 81 MG tablet Take 1 tablet (81 mg total) by mouth daily. Swallow  whole.   atorvastatin 40 MG tablet Commonly known as: LIPITOR Take 1 tablet (40 mg total) by mouth daily. Start taking on: March 09, 2023   clopidogrel 75 MG tablet Commonly known as: Plavix Take 1 tablet (75 mg total) by mouth daily for 21 days. Start taking on: March 09, 2023   dexamethasone 4 MG tablet Commonly known as: DECADRON Take 2 tablets (8 mg total) by mouth daily for 30 doses. Take 2 tablets once daily for 2 days after your daratumumab treatment   levothyroxine 50 MCG tablet Commonly known as: SYNTHROID Take 50 mcg by mouth daily before breakfast.   pantoprazole 40 MG tablet Commonly known as: PROTONIX Take 40 mg by mouth daily as needed.   PROBIOTIC ADVANCED PO Take 1 tablet by mouth at bedtime.   senna 8.6 MG tablet Commonly known as: Senokot Take 2 tablets (17.2 mg total) by mouth 2 (two) times daily. May crush, mix with water and give sublingually if needed.         Follow-up Information     Gilmore, Melvin D, MD Follow up in 1 week(s).   Specialties: Cardiology, Internal Medicine Why: for implanted loop recorder for long-term cardiac monitoring Contact information: [address] [place] [address] [phone]                 Allergies  Allergen Reactions   Ciprofloxacin Diarrhea    He experienced an elevation in blood pressure, shortness of breath and diarrhea when he took the Cipro.    Amoxicillin-Pot Clavulanate Rash and Other (See Comments)    Pt states he can't recall what the reactions were   Meloxicam Rash and Other (See Comments)     The results of significant diagnostics from this hospitalization (including imaging, microbiology, ancillary and laboratory) are listed below for reference.   Consultations:   Procedures/Studies: ECHOCARDIOGRAM COMPLETE  Result Date: 03/08/2023    ECHOCARDIOGRAM REPORT   Patient Name:   Melvin Gilmore Date of Exam: 03/08/2023 Medical Rec #:  [id number]        Height:       71.0  in Accession #:    [id number]       Weight:       131.0 lb Date of Birth:  07-24-1940        BSA:          1.762 m Patient Age:    81 years         BP:           126/73 mmHg Patient Gender: M                HR:           61 bpm. Exam Location:  [facility] Procedure: 2D Echo, Cardiac Doppler, Color Doppler and Strain Analysis Indications:     Stroke 434.91 / I163.9  History:         Patient has no prior history of Echocardiogram examinations.  Signs/Symptoms:Chest Pain; Risk Factors:Hypertension.  Sonographer:     Neysa Bonito Roar Referring Phys:  [phone] MCNEILL P KIRKPATRICK Diagnosing Phys: Armanda Magic MD  Sonographer Comments: Global longitudinal strain was attempted. IMPRESSIONS  1. Left ventricular ejection fraction, by estimation, is 60 to 65%. The left ventricle has normal function. The left ventricle has no regional wall motion abnormalities. Left ventricular diastolic parameters are consistent with Grade I diastolic dysfunction (impaired relaxation). The average left ventricular global longitudinal strain is -16.7 %. The global longitudinal strain is normal.  2. Right ventricular systolic function is normal. The right ventricular size is normal.  3. The mitral valve is normal in structure. Mild mitral valve regurgitation. No evidence of mitral stenosis.  4. The aortic valve is tricuspid. Aortic valve regurgitation is trivial. Aortic valve sclerosis/calcification is present, without any evidence of aortic stenosis. Aortic valve area, by VTI measures 2.80 cm. Aortic valve mean gradient measures 2.0 mmHg.  Aortic valve Vmax measures 0.94 m/s.  5. Aortic dilatation noted. There is mild dilatation of the aortic root, measuring 39 mm. FINDINGS  Left Ventricle: Left ventricular ejection fraction, by estimation, is 60 to 65%. The left ventricle has normal function. The left ventricle has no regional wall motion abnormalities. The average left ventricular global longitudinal strain is -16.7 %. The global  longitudinal strain is normal. The left ventricular internal cavity size was normal in size. There is no left ventricular hypertrophy. Left ventricular diastolic parameters are consistent with Grade I diastolic dysfunction (impaired relaxation). Normal left ventricular filling pressure. Right Ventricle: The right ventricular size is normal. No increase in right ventricular wall thickness. Right ventricular systolic function is normal. Left Atrium: Left atrial size was normal in size. Right Atrium: Right atrial size was normal in size. Pericardium: There is no evidence of pericardial effusion. Mitral Valve: The mitral valve is normal in structure. Mild mitral annular calcification. Mild mitral valve regurgitation. No evidence of mitral valve stenosis. Tricuspid Valve: The tricuspid valve is normal in structure. Tricuspid valve regurgitation is trivial. No evidence of tricuspid stenosis. Aortic Valve: The aortic valve is tricuspid. Aortic valve regurgitation is trivial. Aortic valve sclerosis/calcification is present, without any evidence of aortic stenosis. Aortic valve mean gradient measures 2.0 mmHg. Aortic valve peak gradient measures 3.5 mmHg. Aortic valve area, by VTI measures 2.80 cm. Pulmonic Valve: The pulmonic valve was normal in structure. Pulmonic valve regurgitation is mild. No evidence of pulmonic stenosis. Aorta: Aortic dilatation noted. There is mild dilatation of the aortic root, measuring 39 mm. Venous: The inferior vena cava was not well visualized. IAS/Shunts: No atrial level shunt detected by color flow Doppler.  LEFT VENTRICLE PLAX 2D LVIDd:         4.30 cm   Diastology LVIDs:         3.10 cm   LV e' medial:    8.70 cm/s LV PW:         0.80 cm   LV E/e' medial:  8.0 LV IVS:        0.90 cm   LV e' lateral:   7.51 cm/s LVOT diam:     2.00 cm   LV E/e' lateral: 9.3 LV SV:         47 LV SV Index:   27        2D Longitudinal Strain LVOT Area:     3.14 cm  2D Strain GLS (A2C):   -17.0 %  2D Strain GLS (A3C):   -17.4 %                          2D Strain GLS (A4C):   -15.7 %                          2D Strain GLS Avg:     -16.7 % RIGHT VENTRICLE RV Basal diam:  2.80 cm RV Mid diam:    2.70 cm RV S prime:     13.70 cm/s TAPSE (M-mode): 2.1 cm LEFT ATRIUM             Index        RIGHT ATRIUM           Index LA diam:        3.00 cm 1.70 cm/m   RA Area:     14.10 cm LA Vol (A2C):   39.0 ml 22.14 ml/m  RA Volume:   30.80 ml  17.48 ml/m LA Vol (A4C):   34.2 ml 19.42 ml/m LA Biplane Vol: 37.1 ml 21.06 ml/m  AORTIC VALVE                    PULMONIC VALVE AV Area (Vmax):    2.66 cm     PV Vmax:          0.90 m/s AV Area (Vmean):   2.52 cm     PV Peak grad:     3.2 mmHg AV Area (VTI):     2.80 cm     PR End Diast Vel: 4.08 msec AV Vmax:           94.20 cm/s   RVOT Peak grad:   2 mmHg AV Vmean:          61.500 cm/s AV VTI:            0.168 m AV Peak Grad:      3.5 mmHg AV Mean Grad:      2.0 mmHg LVOT Vmax:         79.80 cm/s LVOT Vmean:        49.300 cm/s LVOT VTI:          0.150 m LVOT/AV VTI ratio: 0.89  AORTA Ao Root diam: 3.10 cm Ao Asc diam:  3.20 cm MITRAL VALVE               TRICUSPID VALVE MV Area (PHT): 3.10 cm    TR Peak grad:   19.9 mmHg MV Decel Time: 245 msec    TR Vmax:        223.00 cm/s MV E velocity: 69.70 cm/s MV A velocity: 95.90 cm/s  SHUNTS MV E/A ratio:  0.73        Systemic VTI:  0.15 m MV A Prime:    10.1 cm/s   Systemic Diam: 2.00 cm Armanda Magic MD Electronically signed by Armanda Magic MD Signature Date/Time: 03/08/2023/5:43:04 PM    Final    MR BRAIN WO CONTRAST  Result Date: 03/08/2023 CLINICAL DATA:  Stroke, follow-up. Acute onset aphasia and right hemianopia with marked improvement following IV tenecteplase. EXAM: MRI HEAD WITHOUT CONTRAST TECHNIQUE: Multiplanar, multiecho pulse sequences of the brain and surrounding structures were obtained without intravenous contrast. COMPARISON:  Head CT and CTA head/neck 03/07/2023. MRI brain 06/24/2020. FINDINGS:  Brain: No acute infarct or hemorrhage. Stable mild chronic small-vessel disease. No mass or midline shift.  No hydrocephalus or extra-axial collection. No abnormal susceptibility. Vascular: Normal flow voids. Skull and upper cervical spine: Normal marrow signal. Sinuses/Orbits: No acute findings. Other: None. IMPRESSION: No acute intracranial process. Electronically Signed   By: Orvan Falconer M.D.   On: 03/08/2023 11:19   CT HEAD WO CONTRAST ( )  Result Date: 03/07/2023 CLINICAL DATA:  Stroke, follow-up. Dysphagia beginning at 1 p.m. today. Patient previously was treated with TIA head a EXAM: CT HEAD WITHOUT CONTRAST TECHNIQUE: Contiguous axial images were obtained from the base of the skull through the vertex without intravenous contrast. RADIATION DOSE REDUCTION: This exam was performed according to the departmental dose-optimization program which includes automated exposure control, adjustment of the mA and/or kV according to patient size and/or use of iterative reconstruction technique. COMPARISON:  CT head without contrast 03/07/2023. CT angio head and neck 03/07/2023. FINDINGS: Brain: No acute infarct, hemorrhage, or mass lesion is present. Mild periventricular white matter hypoattenuation is stable. Deep brain nuclei are within normal limits. No significant extraaxial fluid collection is present. The ventricles are proportionate to the degree of atrophy. The brainstem and cerebellum are within normal limits. Midline structures are within normal limits. Vascular: Choose scratched at atherosclerotic calcifications are present within the cavernous internal carotid arteries bilaterally. No hyperdense vessel is present. Skull: Calvarium is intact. No focal lytic or blastic lesions are present. No significant extracranial soft tissue lesion is present. Sinuses/Orbits: The paranasal sinuses and mastoid air cells are clear. A right lens replacement is present. Globes and orbits are otherwise within normal  limits. IMPRESSION: 1. No acute intracranial abnormality or significant interval change. 2. Stable mild periventricular white matter disease. 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 Electronically Signed   By: Marin Roberts M.D.   On: 03/07/2023 16:20   EEG adult  Result Date: 03/07/2023 Rejeana Brock, MD     03/07/2023  3:49 PM History: 82 year old male who presented with acute aphasia Sedation: None Technique: This EEG was acquired with electrodes placed according to the International 10-20 electrode system (including Fp1, Fp2, F3, F4, C3, C4, P3, P4, O1, O2, T3, T4, T5, T6, A1, A2, Fz, Cz, Pz). The following electrodes were missing or displaced: none. Patient State: Awake and drowsy Background: There is a posterior dominant rhythm of 8 to 9 Hz.  In addition, there is generalized frontally predominant irregular slow activity throughout the recording.  This pattern was present throughout the recording. Photic stimulation: Physiologic driving is not performed EEG Abnormalities: 1) generalized irregular slow activity Clinical Interpretation: This EEG is consistent with a mild generalized nonspecific cerebral dysfunction (encephalopathy).   There was no seizure or seizure predisposition recorded on this study. Please note that lack of epileptiform activity on EEG does not preclude the possibility of epilepsy. Ritta Slot, MD [facility] [phone] If 7pm- 7am, please page neurology on call as listed in AMION.  CT HEAD CODE STROKE WO CONTRAST  Result Date: 03/07/2023 CLINICAL DATA:  Code stroke. Disorientation, right hemianopsia, and aphasia EXAM: CT ANGIOGRAPHY HEAD AND NECK WITH AND WITHOUT CONTRAST TECHNIQUE: Multidetector CT imaging of the head and neck was performed using the standard protocol during bolus administration of intravenous contrast. Multiplanar CT image reconstructions and MIPs were obtained to evaluate the vascular anatomy.  Carotid stenosis measurements (when applicable) are obtained utilizing NASCET criteria, using the distal internal carotid diameter as the denominator. RADIATION DOSE REDUCTION: This exam was performed according to the departmental dose-optimization program which includes automated exposure control, adjustment of the mA and/or  kV according to patient size and/or use of iterative reconstruction technique. CONTRAST:  75mL OMNIPAQUE IOHEXOL 350 MG/ML SOLN COMPARISON:  CT head 08/21/2022 FINDINGS: CT HEAD FINDINGS Brain: There is no acute intracranial hemorrhage, extra-axial fluid collection, or acute infarct. Parenchymal volume is within expected limits for age. The ventricles are normal in size. Gray-white differentiation is preserved The pituitary and suprasellar region are normal. There is no mass lesion. There is no mass effect or midline shift. Vascular: No hyperdense vessel or unexpected calcification. Skull: Normal. Negative for fracture or focal lesion. Sinuses/Orbits: The imaged paranasal sinuses are clear. A right lens implant is noted. The globes and orbits are otherwise unremarkable. Other: The mastoid air cells and middle ear cavities are clear. ASPECTS [facility] Stroke Program Early CT Score) - Ganglionic level infarction (caudate, lentiform nuclei, internal capsule, insula, M1-M3 cortex): 7 - Supraganglionic infarction (M4-M6 cortex): 3 Total score (0-10 with 10 being normal): 10 CTA NECK FINDINGS Aortic arch: There is a minimal calcified plaque in the imaged aortic arch. The origins of the major branch vessels are patent. The subclavian arteries are patent to the level imaged. Right carotid system: The right common, internal, and external carotid arteries are patent, with minimal plaque at the bifurcation but no hemodynamically significant stenosis or occlusion. There is no evidence of dissection or aneurysm. Left carotid system: The left common, internal, and external carotid arteries are patent, with  minimal plaque at the bifurcation but no hemodynamically significant stenosis or occlusion. There is no evidence of dissection or aneurysm. Vertebral arteries: The vertebral arteries are patent, without hemodynamically significant stenosis or occlusion there is no evidence of dissection or aneurysm. Skeleton: There is mild compression deformity of the superior T3 and T4 endplates, similar to the prior thoracic spine MRI. There are diffuse small lucencies throughout the imaged spine consistent with myeloma lesions. There is no definite soft tissue component within the spinal canal. Other neck: The soft tissues of the neck are unremarkable. Upper chest: There is scarring in both lung apices. Review of the MIP images confirms the above findings CTA HEAD FINDINGS Anterior circulation: There is mild calcified plaque in the intracranial ICAs without significant stenosis or occlusion. The bilateral MCAs and ACAS are patent, without proximal stenosis or occlusion. There is a common A2 trunk, a normal variant. There is no aneurysm or AVM. Posterior circulation: There is diminutive enhancement of the right V4 segment, favored predominantly congenital with possible superimposed atherosclerosis. The left V4 segment is patent. The basilar artery is patent. The major cerebellar arteries are patent. The bilateral PCAs are patent, predominantly supplied by posterior communicating arteries bilaterally (fetal origins). There is no proximal stenosis or occlusion. There is no aneurysm or AVM. Venous sinuses: Suboptimally evaluated due to bolus timing. Anatomic variants: As above. Review of the MIP images confirms the above findings IMPRESSION: 1. No acute intracranial pathology. 2. Patent vasculature of the head and neck, with no hemodynamically significant stenosis, occlusion, or dissection. 3. Diffuse lucencies throughout the imaged spine consistent with myeloma lesions. Findings of the initial noncontrast head CT communicated to Dr  Amada Jupiter at 10:00 am. The CTA results were discussed at approximately 10:15 am. Electronically Signed   By: Lesia Hausen M.D.   On: 03/07/2023 10:28   CT ANGIO HEAD NECK W WO CM (CODE STROKE)  Result Date: 03/07/2023 CLINICAL DATA:  Code stroke. Disorientation, right hemianopsia, and aphasia EXAM: CT ANGIOGRAPHY HEAD AND NECK WITH AND WITHOUT CONTRAST TECHNIQUE: Multidetector CT imaging of the head and neck was  performed using the standard protocol during bolus administration of intravenous contrast. Multiplanar CT image reconstructions and MIPs were obtained to evaluate the vascular anatomy. Carotid stenosis measurements (when applicable) are obtained utilizing NASCET criteria, using the distal internal carotid diameter as the denominator. RADIATION DOSE REDUCTION: This exam was performed according to the departmental dose-optimization program which includes automated exposure control, adjustment of the mA and/or kV according to patient size and/or use of iterative reconstruction technique. CONTRAST:  75mL OMNIPAQUE IOHEXOL 350 MG/ML SOLN COMPARISON:  CT head 08/21/2022 FINDINGS: CT HEAD FINDINGS Brain: There is no acute intracranial hemorrhage, extra-axial fluid collection, or acute infarct. Parenchymal volume is within expected limits for age. The ventricles are normal in size. Gray-white differentiation is preserved The pituitary and suprasellar region are normal. There is no mass lesion. There is no mass effect or midline shift. Vascular: No hyperdense vessel or unexpected calcification. Skull: Normal. Negative for fracture or focal lesion. Sinuses/Orbits: The imaged paranasal sinuses are clear. A right lens implant is noted. The globes and orbits are otherwise unremarkable. Other: The mastoid air cells and middle ear cavities are clear. ASPECTS [facility] Stroke Program Early CT Score) - Ganglionic level infarction (caudate, lentiform nuclei, internal capsule, insula, M1-M3 cortex): 7 - Supraganglionic  infarction (M4-M6 cortex): 3 Total score (0-10 with 10 being normal): 10 CTA NECK FINDINGS Aortic arch: There is a minimal calcified plaque in the imaged aortic arch. The origins of the major branch vessels are patent. The subclavian arteries are patent to the level imaged. Right carotid system: The right common, internal, and external carotid arteries are patent, with minimal plaque at the bifurcation but no hemodynamically significant stenosis or occlusion. There is no evidence of dissection or aneurysm. Left carotid system: The left common, internal, and external carotid arteries are patent, with minimal plaque at the bifurcation but no hemodynamically significant stenosis or occlusion. There is no evidence of dissection or aneurysm. Vertebral arteries: The vertebral arteries are patent, without hemodynamically significant stenosis or occlusion there is no evidence of dissection or aneurysm. Skeleton: There is mild compression deformity of the superior T3 and T4 endplates, similar to the prior thoracic spine MRI. There are diffuse small lucencies throughout the imaged spine consistent with myeloma lesions. There is no definite soft tissue component within the spinal canal. Other neck: The soft tissues of the neck are unremarkable. Upper chest: There is scarring in both lung apices. Review of the MIP images confirms the above findings CTA HEAD FINDINGS Anterior circulation: There is mild calcified plaque in the intracranial ICAs without significant stenosis or occlusion. The bilateral MCAs and ACAS are patent, without proximal stenosis or occlusion. There is a common A2 trunk, a normal variant. There is no aneurysm or AVM. Posterior circulation: There is diminutive enhancement of the right V4 segment, favored predominantly congenital with possible superimposed atherosclerosis. The left V4 segment is patent. The basilar artery is patent. The major cerebellar arteries are patent. The bilateral PCAs are patent,  predominantly supplied by posterior communicating arteries bilaterally (fetal origins). There is no proximal stenosis or occlusion. There is no aneurysm or AVM. Venous sinuses: Suboptimally evaluated due to bolus timing. Anatomic variants: As above. Review of the MIP images confirms the above findings IMPRESSION: 1. No acute intracranial pathology. 2. Patent vasculature of the head and neck, with no hemodynamically significant stenosis, occlusion, or dissection. 3. Diffuse lucencies throughout the imaged spine consistent with myeloma lesions. Findings of the initial noncontrast head CT communicated to Dr Amada Jupiter at 10:00 am. The CTA  results were discussed at approximately 10:15 am. Electronically Signed   By: Lesia Hausen M.D.   On: 03/07/2023 10:28      Labs: BNP (last 3 results) No results for input(s): "BNP" in the last 8760 hours. Basic Metabolic Panel: Recent Labs  Lab 03/07/23 0926 03/07/23 1023 03/08/23 0520  NA 129* 126* 131*  K 3.9 3.4* 4.6  CL 98 97* 104  CO2 20* 17* 21*  GLUCOSE 100* 105* 88  BUN 11 10 9   CREATININE 0.78 0.83 0.77  CALCIUM 9.0 8.5* 8.9  PHOS  --   --  3.3   Liver Function Tests: Recent Labs  Lab 03/07/23 1023 03/08/23 0520  AST 29  --   ALT 21  --   ALKPHOS 78  --   BILITOT 1.2  --   PROT 6.1*  --   ALBUMIN 3.6 3.3*   No results for input(s): "LIPASE", "AMYLASE" in the last 168 hours. No results for input(s): "AMMONIA" in the last 168 hours. CBC: Recent Labs  Lab 03/07/23 0925 03/07/23 1023  WBC 7.3 5.7  NEUTROABS 3.6 3.4  HGB 13.5 12.1*  HCT 38.9* 36.3*  MCV 98.2 102.0*  PLT 176 152   Cardiac Enzymes: No results for input(s): "CKTOTAL", "CKMB", "CKMBINDEX", "TROPONINI" in the last 168 hours. BNP: Invalid input(s): "POCBNP" CBG: Recent Labs  Lab 03/07/23 0950 03/07/23 1137  GLUCAP 97 107*   D-Dimer No results for input(s): "DDIMER" in the last 72 hours. Hgb A1c Recent Labs    03/07/23 1022  HGBA1C 4.3*   Lipid  Profile Recent Labs    03/08/23 0520  CHOL 229*  HDL 61  LDLCALC 155*  TRIG 64  CHOLHDL 3.8   Thyroid function studies No results for input(s): "TSH", "T4TOTAL", "T3FREE", "THYROIDAB" in the last 72 hours.  Invalid input(s): "FREET3" Anemia work up No results for input(s): "VITAMINB12", "FOLATE", "FERRITIN", "TIBC", "IRON", "RETICCTPCT" in the last 72 hours. Urinalysis    Component Value Date/Time   COLORURINE STRAW (A) 03/07/2023 1043   APPEARANCEUR CLEAR (A) 03/07/2023 1043   LABSPEC 1.020 03/07/2023 1043   PHURINE 8.0 03/07/2023 1043   GLUCOSEU NEGATIVE 03/07/2023 1043   HGBUR NEGATIVE 03/07/2023 1043   BILIRUBINUR NEGATIVE 03/07/2023 1043   KETONESUR 5 (A) 03/07/2023 1043   PROTEINUR NEGATIVE 03/07/2023 1043   NITRITE NEGATIVE 03/07/2023 1043   LEUKOCYTESUR SMALL (A) 03/07/2023 1043   Sepsis Labs Recent Labs  Lab 03/07/23 0925 03/07/23 1023  WBC 7.3 5.7   Microbiology Recent Results (from the past 240 hour(s))  Remove urinary catheter to obtain Clean Catch urine culture     Status: None   Collection Time: 03/07/23 10:43 AM   Specimen: Urine, Clean Catch  Result Value Ref Range Status   Specimen Description   Final    URINE, CLEAN CATCH Performed at [facility], [address], [place], [place] [address]    Special Requests   Final    NONE Performed at [facility], [address] [place], [place] [address]    Culture   Final    NO GROWTH Performed at [facility], [address], [place], [place] [address]    Report Status 03/08/2023 FINAL  Final  MRSA Next Gen by PCR, Nasal     Status: None   Collection Time: 03/07/23 12:10 PM   Specimen: Nasal Mucosa; Nasal Swab  Result Value Ref Range Status   MRSA by PCR Next Gen NOT DETECTED NOT DETECTED Final  Comment: (NOTE) The GeneXpert MRSA Assay (FDA approved for NASAL specimens only), is one component of a comprehensive MRSA colonization surveillance program. It is not  intended to diagnose MRSA infection nor to guide or monitor treatment for MRSA infections. Test performance is not FDA approved in patients less than 70 years old. Performed at [facility], [address] [place], [place] [address]      Total time spend on discharging this patient, including the last patient exam, discussing the hospital stay, instructions for ongoing care as it relates to all pertinent caregivers, as well as preparing the medical discharge records, prescriptions, and/or referrals as applicable, is 45 minutes.    Darlin Priestly, MD  [facility] 03/08/2023, 6:16 PM

## 2023-03-08 NOTE — Evaluation (Cosign Needed)
Physical Therapy Evaluation Patient Details Name: Melvin Gilmore MRN: [id number] DOB: 1940-10-04 Today's Date: 03/08/2023  History of Present Illness  Pt is a 82 y/o male presenting due to AMS and stroke like symptoms (acute aphasia, R hemianopsia). TNK administered 8/16 and MRI on 8/17 noted no acute abnormalities. PMH includes multiple myeloma on chemo, UTI, HLD, HTN, prostate cancer, hypothyroidism, and menieres disease.  Clinical Impression   Pt presents laying in bed with wife in the room, no complaints of pain. He currently lives with his wife/ son in a 2 story home (bathroom/bedroom access on 1st floor) with 4 steps to enter and a rail on the R. PTA he was modi for all mobility and ADLs, his wife occasionally assisting for bathing/ dressing.   PT sensation/coordination screening unremarkable and LE MMT gross 4-5/5 B/L. Pt able to perform supine>sit, sit<>stand, ambulate ~4ft, and transfer from bed>recliner with CGA and RW. Gait analysis revealed increased R knee/hip flexion for foot clearance. Pt overall noted his walking feels different and he's been feeling more unsteady lately during functional activities. Pt would benefit from continued skilled PT to maximize functional abilities.       If plan is discharge home, recommend the following: A little help with walking and/or transfers;A little help with bathing/dressing/bathroom;Assistance with cooking/housework;Direct supervision/assist for medications management;Assist for transportation   Can travel by private vehicle        Equipment Recommendations None recommended by PT  Recommendations for Other Services       Functional Status Assessment Patient has had a recent decline in their functional status and demonstrates the ability to make significant improvements in function in a reasonable and predictable amount of time.     Precautions / Restrictions Precautions Precautions: Fall Restrictions Weight Bearing Restrictions:  No      Mobility  Bed Mobility Overal bed mobility: Needs Assistance Bed Mobility: Supine to Sit     Supine to sit: Contact guard     General bed mobility comments: CGA for safety    Transfers Overall transfer level: Needs assistance Equipment used: Rolling walker (2 wheels) Transfers: Sit to/from Stand Sit to Stand: Contact guard assist                Ambulation/Gait Ambulation/Gait assistance: Contact guard assist Gait Distance (Feet): 80 Feet Assistive device: Rolling walker (2 wheels)         General Gait Details: increased L knee/hip flexion for clearance  Stairs            Wheelchair Mobility     Tilt Bed    Modified Rankin (Stroke Patients Only)       Balance Overall balance assessment: Needs assistance Sitting-balance support: Feet supported, No upper extremity supported Sitting balance-Leahy Scale: Good     Standing balance support: During functional activity, Bilateral upper extremity supported Standing balance-Leahy Scale: Fair                               Pertinent Vitals/Pain Pain Assessment Pain Assessment: No/denies pain    Home Living Family/patient expects to be discharged to:: Private residence Living Arrangements: Spouse/significant other;Children Available Help at Discharge: Family;Available 24 hours/day Type of Home: House Home Access: Stairs to enter Entrance Stairs-Rails: Right Entrance Stairs-Number of Steps: 4   Home Layout: Two level;Able to live on main level with bedroom/bathroom Home Equipment: Shower seat - built in;Rollator (4 wheels);Grab bars - tub/shower      Prior  Function Prior Level of Function : Needs assist             Mobility Comments: uses rollator for ambulation at baseline ADLs Comments: occasional assist from wife with bathing/dressing     Extremity/Trunk Assessment   Upper Extremity Assessment Upper Extremity Assessment: Overall WFL for tasks assessed (light  touch sensation intact b/l)    Lower Extremity Assessment Lower Extremity Assessment: Overall WFL for tasks assessed (Gross LE MMT 4/5, light touch sensation intact b/l)       Communication   Communication Communication: Hearing impairment (HOH, hears best in L ear)  Cognition Arousal: Alert Behavior During Therapy: WFL for tasks assessed/performed Overall Cognitive Status: Within Functional Limits for tasks assessed                                          General Comments      Exercises     Assessment/Plan    PT Assessment Patient needs continued PT services  PT Problem List Decreased strength;Decreased range of motion;Decreased activity tolerance;Decreased balance;Decreased mobility;Decreased knowledge of precautions;Decreased safety awareness;Decreased knowledge of use of DME       PT Treatment Interventions DME instruction;Gait training;Stair training;Functional mobility training;Therapeutic activities;Therapeutic exercise;Balance training;Neuromuscular re-education;Patient/family education    PT Goals (Current goals can be found in the Care Plan section)  Acute Rehab PT Goals Patient Stated Goal: return home PT Goal Formulation: With patient Time For Goal Achievement: 03/22/23 Potential to Achieve Goals: Good    Frequency Min 1X/week     Co-evaluation               AM-PAC PT "6 Clicks" Mobility  Outcome Measure Help needed turning from your back to your side while in a flat bed without using bedrails?: A Little Help needed moving from lying on your back to sitting on the side of a flat bed without using bedrails?: A Little Help needed moving to and from a bed to a chair (including a wheelchair)?: A Little Help needed standing up from a chair using your arms (e.g., wheelchair or bedside chair)?: A Little Help needed to walk in hospital room?: A Little Help needed climbing 3-5 steps with a railing? : A Little 6 Click Score: 18    End  of Session Equipment Utilized During Treatment: Gait belt Activity Tolerance: Patient tolerated treatment well Patient left: in chair;with call bell/phone within reach;with family/visitor present Nurse Communication: Other (comment) (RN notified and aware pt in recliner without alarm) PT Visit Diagnosis: Other abnormalities of gait and mobility (R26.89)    Time: 8119-1478 PT Time Calculation (min) (ACUTE ONLY): 30 min   Charges:    Acute care Visit (1)  Eval complexity: low  Therapeutic activity (1 unit)        Melvin Gilmore, PT, SPT 3:55 PM,03/08/23

## 2023-03-08 NOTE — Progress Notes (Signed)
PHARMACIST - PHYSICIAN COMMUNICATION  CONCERNING: IV to Oral Route Change Policy  RECOMMENDATION: This patient is receiving pantoprazole by the intravenous route.  Based on criteria approved by the Pharmacy and Therapeutics Committee, the intravenous medication(s) is/are being converted to the equivalent oral dose form(s).  DESCRIPTION: These criteria include: The patient is eating (either orally or via tube) and/or has been taking other orally administered medications for a least 24 hours The patient has no evidence of active gastrointestinal bleeding or impaired GI absorption (gastrectomy, short bowel, patient on TNA or NPO).  If you have questions about this conversion, please contact the Pharmacy Department   Tressie Ellis, [facility] 03/08/2023 12:01 PM

## 2023-03-08 NOTE — Evaluation (Signed)
Occupational Therapy Evaluation Patient Details Name: Melvin Gilmore MRN: [id number] DOB: Sep 12, 1940 Today's Date: 03/08/2023   History of Present Illness Pt is a 82 y/o male presenting due to AMS and stroke like symptoms (acute aphasia, R hemianopsia). TNK administered 8/16 and MRI on 8/17 noted no acute abnormalities. PMH includes multiple myeloma on chemo, UTI, HLD, HTN, prostate cancer, hypothyroidism, and menieres disease.   Clinical Impression   Melvin Gilmore was seen for OT evaluation this date. Prior to hospital admission, pt was MOD I with supervision from family or personal aid as needed. Pt currently IND don B socks seated EOB. IND setup for tooth paste and opening food packages. CGA simulated shower t/f, mild balance deficits noted in standing. Visual scanning tested, decreased smoothness of horizontal and vertical tracking noted, limited L peripheral vision. Mild cognitive deficits noted, spouse reports at baseline. Educated on visual scanning techniques and OT follow up recs. Upon hospital discharge, recommend no OT follow up.     If plan is discharge home, recommend the following: Supervision due to cognitive status    Functional Status Assessment  Patient has had a recent decline in their functional status and demonstrates the ability to make significant improvements in function in a reasonable and predictable amount of time.  Equipment Recommendations  None recommended by OT    Recommendations for Other Services       Precautions / Restrictions Precautions Precautions: Fall Restrictions Weight Bearing Restrictions: No      Mobility Bed Mobility               General bed mobility comments: not tested    Transfers Overall transfer level: Needs assistance   Transfers: Sit to/from Stand Sit to Stand: Contact guard assist                  Balance Overall balance assessment: Needs assistance Sitting-balance support: Feet supported, No upper extremity  supported Sitting balance-Leahy Scale: Good     Standing balance support: During functional activity, No upper extremity supported Standing balance-Leahy Scale: Fair                             ADL either performed or assessed with clinical judgement   ADL Overall ADL's : Needs assistance/impaired                                       General ADL Comments: IND don B socks seated EOB. IND setup for tooth paste and opening food packages. CGA simulated shower t/f.       Vision Baseline Vision/History: 1 Wears glasses Vision Assessment?: Yes Tracking/Visual Pursuits: Decreased smoothness of horizontal tracking;Decreased smoothness of vertical tracking Additional Comments: impaired L peripheral visin            Pertinent Vitals/Pain Pain Assessment Pain Assessment: No/denies pain     Extremity/Trunk Assessment Upper Extremity Assessment Upper Extremity Assessment: Overall WFL for tasks assessed   Lower Extremity Assessment Lower Extremity Assessment: Overall WFL for tasks assessed       Communication Communication Communication: Hearing impairment Cueing Techniques: Verbal cues   Cognition Arousal: Alert Behavior During Therapy: WFL for tasks assessed/performed Overall Cognitive Status: History of cognitive impairments - at baseline  Home Living Family/patient expects to be discharged to:: Private residence Living Arrangements: Spouse/significant other;Children Available Help at Discharge: Family;Available 24 hours/day Type of Home: House Home Access: Stairs to enter [employer] of Steps: 4 Entrance Stairs-Rails: Right Home Layout: Two level;Able to live on main level with bedroom/bathroom Alternate Level Stairs-Number of Steps: flight (3-landing-8 to 10 more) Alternate Level Stairs-Rails: Left Bathroom Shower/Tub: Walk-in shower         Home  Equipment: Shower seat - built in;Rollator (4 wheels);Grab bars - tub/shower      Lives With: Spouse;Family    Prior Functioning/Environment Prior Level of Function : Needs assist             Mobility Comments: uses rollator for ambulation at baseline, supervision ADLs Comments: supervision from wife with bathing/dressing        OT Problem List: Decreased activity tolerance;Impaired balance (sitting and/or standing);Impaired vision/perception         OT Goals(Current goals can be found in the care plan section) Acute Rehab OT Goals Patient Stated Goal: to go home OT Goal Formulation: With patient/family Time For Goal Achievement: 03/08/23 Potential to Achieve Goals: Good   AM-PAC OT "6 Clicks" Daily Activity     Outcome Measure Help from another person eating meals?: None Help from another person taking care of personal grooming?: None Help from another person toileting, which includes using toliet, bedpan, or urinal?: None Help from another person bathing (including washing, rinsing, drying)?: A Little Help from another person to put on and taking off regular upper body clothing?: None Help from another person to put on and taking off regular lower body clothing?: None 6 Click Score: 23   End of Session    Activity Tolerance: Patient tolerated treatment well Patient left: in chair;with call bell/phone within reach;with family/visitor present  OT Visit Diagnosis: Unsteadiness on feet (R26.81)                Time: 3086-5784 OT Time Calculation (min): 21 min Charges:  OT General Charges $OT Visit: 1 Visit OT Evaluation $OT Eval Moderate Complexity: 1 Mod  Kathie Dike, M.S. OTR/L  03/08/23, 3:55 PM  ascom [phone]

## 2023-03-08 NOTE — Progress Notes (Signed)
  Echocardiogram 2D Echocardiogram has been performed.  Melvin Gilmore 03/08/2023, 5:23 PM

## 2023-03-08 NOTE — Evaluation (Signed)
Speech Language Pathology Evaluation Patient Details Name: Melvin Gilmore MRN: [id number] DOB: 1941/05/19 Today's Date: 03/08/2023 Time: 3474-2595 SLP Time Calculation (min) (ACUTE ONLY): 16 min  Problem List:  Patient Active Problem List   Diagnosis Date Noted   Acute ischemic stroke (HCC) 03/07/2023   Urinary tract infection without hematuria 03/07/2023   Compression of lumbar vertebra (HCC) 01/28/2023   Vertebral fracture, pathological 01/26/2023   Acute urinary retention 01/26/2023   Overflow diarrhea 01/26/2023   Generalized weakness 01/26/2023   Hyponatremia 01/26/2023   Poor appetite 01/02/2023   Cancer related pain 12/26/2022   Therapeutic opioid induced constipation 12/26/2022   Encounter for monoclonal antibody treatment for malignancy 12/26/2022   Right hip pain 12/17/2022   Multiple myeloma (HCC) 12/13/2022   Abnormal MRI, thoracic spine 11/21/2022   Compression fracture of T12 vertebra (HCC) 11/21/2022   History of prostate cancer 11/21/2022   Mild anemia 11/21/2022   Hypothyroidism 11/21/2022   GERD (gastroesophageal reflux disease) 11/21/2022   Allergic rhinitis 10/18/2021   Anxiety 10/18/2021   Chest pain, non-cardiac 10/18/2021   Environmental allergies 10/18/2021   Gallbladder disease 10/18/2021   History of Salmonella infection 10/18/2021   Hyperlipidemia 10/18/2021   Hypertension 10/18/2021   Meniere's disease 10/18/2021   Past Medical History:  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   Nausea vomiting and diarrhea  01/26/2023   Prostate cancer (HCC) 2023   Seasonal allergies    Skin cancer    a.) ears   Wears partial dentures    lower   Past Surgical History:  Past Surgical History:  Procedure Laterality Date   APPENDECTOMY     CATARACT EXTRACTION W/PHACO Right 01/15/2016   Procedure: CATARACT EXTRACTION PHACO AND INTRAOCULAR LENS PLACEMENT (IOC);  Surgeon: Sherald Hess, MD;  Location: [facility];  Service: Ophthalmology;  Laterality: Right;  RIGHT CALL CELL PHONE WITH TIME   CHOLECYSTECTOMY     COLONOSCOPY     COLONOSCOPY WITH ESOPHAGOGASTRODUODENOSCOPY (EGD)     ESOPHAGOGASTRODUODENOSCOPY  07/2021   ESOPHAGOGASTRODUODENOSCOPY (EGD) WITH PROPOFOL N/A 09/23/2017   Procedure: ESOPHAGOGASTRODUODENOSCOPY (EGD) WITH PROPOFOL;  Surgeon: Toledo, Boykin Nearing, MD;  Location: [facility];  Service: Gastroenterology;  Laterality: N/A;   HIGH INTENSITY FOCUSED ULTRASOUND (HIFU) OF THE PROSTATE N/A 02/07/2022   Procedure: HIGH INTENSITY FOCUSED ULTRASOUND (HIFU) OF THE PROSTATE;  Surgeon: Orson Ape, MD;  Location: [facility];  Service: Urology;  Laterality: N/A;   HYDROCELE EXCISION / REPAIR     IR KYPHO THORACIC WITH BONE BIOPSY  12/06/2022   IR RADIOLOGIST EVAL & MGMT  11/21/2022   LASIX RT EYE     PROSTATE BIOPSY N/A 01/03/2022   Procedure: PROSTATE BIOPSY  Melvin Gilmore;  Surgeon: Orson Ape, MD;  Location: [facility];  Service: Urology;  Laterality: N/A;   ROTATOR CUFF REPAIR Left 2001   HPI:  Per H&P, " illiam Gilmore is a 82 y.o. male with a past medical significant for hypertension,  hyperlipidemia, prostrate cancer, and multiple myeloma who presented to [facility] ED on 03/07/23 due to complaint of acute onset aphasia.     His wife reports that he was in his normal state of health, until at 8:30 am this morning he abruptly seemed confused with difficulty speaking." Head CT, 8/16, "1. No acute intracranial abnormality or significant interval change.  2. Stable mild periventricular white  matter disease. This likely  reflects the sequela of chronic microvascular ischemia." MRI pending.   Assessment / Plan / Recommendation Clinical Impression  Pt seen for speech/language evaluation. Assessment completed via informal assessment and portions of Western Aphasia Battery Revised (Bedside Record Form). Pt presents with s/sx anomic aphasia c/b reduced confrontation and generative naming. Pt endorsed a marked improvement in wordfinding since yesterday in which family agreed. Pt also noted "brain fog" since initiation of chemotx. Pt stated he has since stopped many iADLs (e.g. driving, finances, medication). Full cognitive-linguistic evaluation deferred due to aphasia. Pt would benefit from further diagnostic tx. SLP to continue to f/u.    SLP Assessment  SLP Recommendation/Assessment: Patient needs continued Speech Lanaguage Pathology Services    Recommendations for follow up therapy are one component of a multi-disciplinary discharge planning process, led by the attending physician.  Recommendations may be updated based on patient status, additional functional criteria and insurance authorization.    Follow Up Recommendations   (in alignment with PT/OT recommendations)    Assistance Recommended at Discharge  Frequent or constant Supervision/Assistance  Functional Status Assessment Patient has had a recent decline in their functional status and demonstrates the ability to make significant improvements in function in a reasonable and predictable amount of time.  Frequency and Duration min 2x/week  2 weeks      SLP Evaluation Cognition  Orientation Level: Oriented to person;Oriented to place;Oriented to situation (not to DOW) Attention: Sustained Sustained Attention: Appears intact Memory:  (did not assess) Problem Solving: Appears intact (verbal)       Comprehension  Auditory Comprehension Overall Auditory Comprehension: Appears within functional limits for tasks  assessed Yes/No Questions: Within Functional Limits Commands: Within Functional Limits Conversation: Simple Interfering Components: Hearing EffectiveTechniques: Extra processing time;Increased volume    Expression Expression Primary Mode of Expression: Verbal Verbal Expression Overall Verbal Expression: Impaired Initiation: No impairment Automatic Speech: Name;Social Response Level of Generative/Spontaneous Verbalization: Word;Phrase;Sentence;Conversation Repetition: No impairment Naming: Impairment Confrontation: Impaired (9/10 objects; reduced for low frequency words) Convergent:  (4 animals in 60s; perserveration noted) Verbal Errors: Perseveration   Oral / Motor  Oral Motor/Sensory Function Overall Oral Motor/Sensory Function: Within functional limits Motor Speech Overall Motor Speech: Appears within functional limits for tasks assessed Respiration: Within functional limits Phonation: Normal Resonance: Within functional limits Articulation: Within functional limitis Intelligibility: Intelligible Motor Planning: Witnin functional limits Motor Speech Errors: Not applicable           Clyde Canterbury, M.S., CCC-SLP Speech-Language Pathologist [facility] [phone] (ASCOM)  Woodroe Chen 03/08/2023, 10:51 AM

## 2023-03-10 ENCOUNTER — Encounter: Payer: Self-pay | Admitting: Internal Medicine

## 2023-03-11 ENCOUNTER — Other Ambulatory Visit: Payer: Self-pay | Admitting: *Deleted

## 2023-03-11 ENCOUNTER — Encounter: Payer: Self-pay | Admitting: Hospice and Palliative Medicine

## 2023-03-13 ENCOUNTER — Other Ambulatory Visit: Payer: Self-pay | Admitting: Internal Medicine

## 2023-03-13 DIAGNOSIS — C9000 Multiple myeloma not having achieved remission: Secondary | ICD-10-CM

## 2023-03-14 ENCOUNTER — Encounter: Payer: Self-pay | Admitting: Internal Medicine

## 2023-03-14 ENCOUNTER — Telehealth: Payer: Self-pay

## 2023-03-14 NOTE — Telephone Encounter (Signed)
Called to check on patient after stroke happened here at the cancer center, and wife, Melvin Gilmore, says that Melvin Gilmore is doing so much better and is looking forward to coming back for treatments on 03/21/2023.

## 2023-03-15 ENCOUNTER — Encounter: Payer: Self-pay | Admitting: Urology

## 2023-03-17 ENCOUNTER — Ambulatory Visit: Payer: Medicare HMO | Admitting: Radiation Oncology

## 2023-03-21 ENCOUNTER — Inpatient Hospital Stay: Payer: Medicare HMO | Admitting: Internal Medicine

## 2023-03-21 ENCOUNTER — Inpatient Hospital Stay: Payer: Medicare HMO

## 2023-03-21 ENCOUNTER — Encounter: Payer: Self-pay | Admitting: Urology

## 2023-03-21 ENCOUNTER — Ambulatory Visit: Payer: Medicare HMO | Admitting: Urology

## 2023-03-21 ENCOUNTER — Encounter: Payer: Self-pay | Admitting: Internal Medicine

## 2023-03-21 ENCOUNTER — Telehealth: Payer: Self-pay

## 2023-03-21 VITALS — BP 120/77 | HR 86

## 2023-03-21 VITALS — BP 121/74 | HR 90 | Temp 97.6°F | Wt 130.0 lb

## 2023-03-21 DIAGNOSIS — C9000 Multiple myeloma not having achieved remission: Secondary | ICD-10-CM

## 2023-03-21 DIAGNOSIS — N390 Urinary tract infection, site not specified: Secondary | ICD-10-CM

## 2023-03-21 DIAGNOSIS — Z5112 Encounter for antineoplastic immunotherapy: Secondary | ICD-10-CM | POA: Diagnosis not present

## 2023-03-21 DIAGNOSIS — N3000 Acute cystitis without hematuria: Secondary | ICD-10-CM

## 2023-03-21 LAB — CBC WITH DIFFERENTIAL (CANCER CENTER ONLY)
Abs Immature Granulocytes: 0.04 10*3/uL (ref 0.00–0.07)
Basophils Absolute: 0 10*3/uL (ref 0.0–0.1)
Basophils Relative: 0 %
Eosinophils Absolute: 0.1 10*3/uL (ref 0.0–0.5)
Eosinophils Relative: 2 %
HCT: 40.9 % (ref 39.0–52.0)
Hemoglobin: 14 g/dL (ref 13.0–17.0)
Immature Granulocytes: 1 %
Lymphocytes Relative: 39 %
Lymphs Abs: 2 10*3/uL (ref 0.7–4.0)
MCH: 33.8 pg (ref 26.0–34.0)
MCHC: 34.2 g/dL (ref 30.0–36.0)
MCV: 98.8 fL (ref 80.0–100.0)
Monocytes Absolute: 0.4 10*3/uL (ref 0.1–1.0)
Monocytes Relative: 8 %
Neutro Abs: 2.6 10*3/uL (ref 1.7–7.7)
Neutrophils Relative %: 50 %
Platelet Count: 146 10*3/uL — ABNORMAL LOW (ref 150–400)
RBC: 4.14 MIL/uL — ABNORMAL LOW (ref 4.22–5.81)
RDW: 14.3 % (ref 11.5–15.5)
WBC Count: 5.2 10*3/uL (ref 4.0–10.5)
nRBC: 0 % (ref 0.0–0.2)

## 2023-03-21 LAB — MICROSCOPIC EXAMINATION: WBC, UA: >30 /hpf — AB (ref 0–5)

## 2023-03-21 LAB — CMP (CANCER CENTER ONLY)
ALT: 28 U/L (ref 0–44)
AST: 30 U/L (ref 15–41)
Albumin: 4.4 g/dL (ref 3.5–5.0)
Alkaline Phosphatase: 100 U/L (ref 38–126)
Anion gap: 8 (ref 5–15)
BUN: 16 mg/dL (ref 8–23)
CO2: 25 mmol/L (ref 22–32)
Calcium: 9.3 mg/dL (ref 8.9–10.3)
Chloride: 95 mmol/L — ABNORMAL LOW (ref 98–111)
Creatinine: 1 mg/dL (ref 0.61–1.24)
GFR, Estimated: >60 mL/min (ref >60)
Glucose, Bld: 95 mg/dL (ref 70–99)
Potassium: 4.5 mmol/L (ref 3.5–5.1)
Sodium: 128 mmol/L — ABNORMAL LOW (ref 135–145)
Total Bilirubin: 0.6 mg/dL (ref 0.3–1.2)
Total Protein: 6.8 g/dL (ref 6.5–8.1)

## 2023-03-21 LAB — URINALYSIS, COMPLETE

## 2023-03-21 MED ORDER — GENTAMICIN SULFATE 40 MG/ML IJ SOLN
80.0000 mg | Freq: Once | INTRAMUSCULAR | Status: AC
Start: 2023-03-21 — End: 2023-03-21
  Administered 2023-03-21: 80 mg via INTRAMUSCULAR

## 2023-03-21 NOTE — Telephone Encounter (Signed)
Called pt's wife to offer a morning appointment today for gentamicin injection, pt's wife states that they are currently at the cancer center and do not anticipate being finished with that appointment until 1pm. Advised wife that Carollee Herter is only in office today until 12pm and if they are not able to come this morning they would need to be scheduled next week and seek care in the ED over the weekend if patient's symptoms worsen. Wife voiced understanding and will call back if they are able to come in sooner.

## 2023-03-21 NOTE — Progress Notes (Unsigned)
03/21/2023 6:37 PM   Melvin Gilmore April 20, 1941 [id number]  Referring provider: [name], MD [address] [facility] [place],  [place] [address]  Urological history: 1.  Prostate cancer -PSA (11/2022) - 3.44 -Previously followed by Dr. 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 Prostate biopsy performed 01/03/22 for a PSA of 8.53 and MRI showing a PI-RADS5 lesion at the left anterior transition zone. Fusion biopsy return Gleason 3+4 adenocarcinoma on the ROI cores. -He underwent HIFU by Dr. Evelene Croon 02/07/22  2.  Urinary retention -secondary to constipation (01/2023)  Chief Complaint  Patient presents with   Urinary Tract Infection   HPI: Melvin Gilmore is a 82 y.o. male who presents today for cloudy urine and dysuria.    Previous records reviewed.  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   At his visit on 03/07/2023, he was seen on 02/25/2023 and diagnosed with a UTI w/ E.coli and pseudomonas aeruginosa.  He was placed on Cipro and was having intolerable side effects.  He experienced an elevated blood pressure, shortness of breath and diarrhea.  These have all subsided once he discontinued the Cipro.  He continues to have dysuria.  Patient denies any modifying or aggravating factors.  Patient denies any recent UTI's, gross hematuria, dysuria or suprapubic/flank pain.  Patient denies any fevers, chills, nausea or vomiting.   UA orange cloudy, greater than 30 WBCs, 0-2 RBCs, 0-10 epithelial cells, granular casts present and many bacteria.  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   Nausea vomiting and diarrhea 01/26/2023   Prostate cancer (HCC) 2023   Seasonal allergies    Skin cancer    a.) ears   Wears partial dentures    lower    Surgical History: Past Surgical History:  Procedure Laterality Date   APPENDECTOMY     CATARACT EXTRACTION W/PHACO Right 01/15/2016   Procedure: CATARACT EXTRACTION PHACO AND INTRAOCULAR LENS PLACEMENT (IOC);  Surgeon: Sherald Hess, MD;  Location: [facility];  Service: Ophthalmology;  Laterality: Right;  RIGHT CALL CELL PHONE WITH TIME   CHOLECYSTECTOMY     COLONOSCOPY     COLONOSCOPY WITH ESOPHAGOGASTRODUODENOSCOPY (EGD)     ESOPHAGOGASTRODUODENOSCOPY  07/2021   ESOPHAGOGASTRODUODENOSCOPY (EGD) WITH PROPOFOL N/A 09/23/2017   Procedure: ESOPHAGOGASTRODUODENOSCOPY (EGD) WITH PROPOFOL;  Surgeon: Toledo, Boykin Nearing, MD;  Location: [facility];  Service: Gastroenterology;  Laterality: N/A;   HIGH INTENSITY FOCUSED ULTRASOUND (HIFU) OF THE PROSTATE N/A 02/07/2022   Procedure: HIGH INTENSITY FOCUSED ULTRASOUND (HIFU) OF THE PROSTATE;  Surgeon: Orson Ape, MD;  Location: [facility];  Service: Urology;  Laterality: N/A;   HYDROCELE EXCISION / REPAIR     IR KYPHO THORACIC WITH BONE BIOPSY  12/06/2022   IR RADIOLOGIST EVAL & MGMT  11/21/2022   LASIX RT EYE     PROSTATE BIOPSY N/A 01/03/2022   Procedure: PROSTATE BIOPSY  Addison Strycharz;  Surgeon: Orson Ape,  MD;  Location: [facility];  Service: Urology;  Laterality: N/A;   ROTATOR CUFF REPAIR Left 2001    Home Medications:  Allergies as of 03/21/2023       Reactions   Ciprofloxacin Diarrhea   He experienced an elevation in blood pressure, shortness of breath and diarrhea when he took the Cipro.    Amoxicillin-pot Clavulanate Rash, Other (See Comments)   Pt states he can't recall what the reactions were   Meloxicam Rash, Other (See Comments)        Medication List         Accurate as of March 21, 2023 11:59 PM. If you have any questions, ask your nurse or doctor.          acetaminophen 500 MG tablet Commonly known as: TYLENOL Take 500 mg by mouth every 6 (six) hours as needed for mild pain, moderate pain, fever or headache.   acyclovir 400 MG tablet Commonly known as: ZOVIRAX Take 400 mg by mouth 2 (two) times daily.   ALPRAZolam 0.25 MG tablet Commonly known as: XANAX Take 1 tablet (0.25 mg total) by mouth at bedtime as needed for anxiety.   atorvastatin 40 MG tablet Commonly known as: LIPITOR Take 1 tablet (40 mg total) by mouth daily.   clopidogrel 75 MG tablet Commonly known as: Plavix Take 1 tablet (75 mg total) by mouth daily for 21 days.   dexamethasone 4 MG tablet Commonly known as: DECADRON Take 2 tablets (8 mg total) by mouth daily for 30 doses. Take 2 tablets once daily for 2 days after your daratumumab treatment   levothyroxine 50 MCG tablet Commonly known as: SYNTHROID Take 50 mcg by mouth daily before breakfast.   pantoprazole 40 MG tablet Commonly known as: PROTONIX Take 40 mg by mouth daily as needed.   PROBIOTIC ADVANCED PO Take 1 tablet by mouth at bedtime.   senna 8.6 MG tablet Commonly known as: Senokot Take 2 tablets (17.2 mg total) by mouth 2 (two) times daily. May crush, mix with water and give sublingually if needed.        Allergies:  Allergies  Allergen Reactions   Ciprofloxacin Diarrhea    He experienced an elevation in blood pressure, shortness of breath and diarrhea when he took the Cipro.    Amoxicillin-Pot Clavulanate Rash and Other (See Comments)    Pt states he can't recall what the reactions were   Meloxicam Rash and Other (See Comments)    Family History: Family History  Problem Relation Age of Onset   Pneumonia Father     Social History:  reports that he has never smoked. He has never used smokeless tobacco. He reports current alcohol use of about 7.0 standard drinks of alcohol  per week. He reports that he does not use drugs.  ROS: Pertinent ROS in HPI  Physical Exam: BP 120/77   Pulse 86   Constitutional:  Well nourished. Alert and oriented, No acute distress. HEENT: [place] AT, moist mucus membranes.  Trachea midline Cardiovascular: No clubbing, cyanosis, or edema. Respiratory: Normal respiratory effort, no increased work of breathing. Neurologic: Grossly intact, no focal deficits, moving all 4 extremities. Psychiatric: Normal mood and affect.  Laboratory Data: Lab Results  Component Value Date   WBC 5.2 03/21/2023   HGB 14.0 03/21/2023   HCT 40.9 03/21/2023   MCV 98.8 03/21/2023   PLT 146 (L) 03/21/2023    Lab Results  Component Value Date   CREATININE 1.00 03/21/2023    Lab Results  Component Value Date   AST 30 03/21/2023   Lab Results  Component Value Date   ALT 28 03/21/2023    Urinalysis Results for orders placed or performed in visit on 03/21/23  Microscopic Examination   Urine  Result Value Ref Range   WBC, UA >30 (A) 0 - 5 /hpf   RBC, Urine 0-2 0 - 2 /hpf   Epithelial Cells (non renal) 0-10 0 - 10 /hpf   Casts Present (A) None seen /lpf   Cast Type Granular casts (A) N/A   Bacteria, UA Many (A) None seen/Few  Urinalysis, Complete  Result Value Ref Range   Specific Gravity, UA CANCELED    pH, UA CANCELED    Color, UA Orange Yellow   Appearance Ur Cloudy (A) Clear   Protein,UA CANCELED    Glucose, UA CANCELED    Ketones, UA CANCELED    Microscopic Examination See below:   I have reviewed the labs.   Pertinent Imaging: N/A  Assessment & Plan:    1. UTI -UA w/ pyuria and bacteruria -urine sent for culture  -Gentamicin 80 mg IM given in the office    Return for pending urine culture results .  These notes generated with voice recognition software. I apologize for typographical errors.  Cloretta Ned  [facility] [address]  [address] [place], [place] [address] [phone]

## 2023-03-21 NOTE — Progress Notes (Signed)
Patient thinks he has a UTI today, burning and odor with urine. Urgency and burning also with urination. He is having trouble with sleeping at night, Janit Bern is helping him which is not prescribed to him. Can you maybe prescribe it to him. Body is aching mostly at night when in the bed trying to sleep. Next week is his appointment with urology and his follow up with cardiologist is in October. He also needs to follow up with Dr. Aggie Cosier.  What kind of vaccines should/able the patient get.

## 2023-03-21 NOTE — Progress Notes (Unsigned)
IM Injection  Patient is present today for an IM Injection for treatment of Gentamicin Drug: Gentamicin Dose:80mg  Location:left upper outer quadrant Lot: [id number] Exp:01/2024 Patient tolerated well, no complications were noted  Performed by: Malcolm Metro RMA  Additional notes/ Follow up: F/UP with Dr Lonna Cobb

## 2023-03-21 NOTE — Telephone Encounter (Signed)
Pt's wife LM on triage line stating that the patient is experiencing dysuria and cloudy urine. He has an appointment to see Dr. Lonna Cobb next week 9/4. Ok to keep that appointment or does he need to be seen sooner? Please advise.

## 2023-03-21 NOTE — Progress Notes (Signed)
[facility] CONSULT NOTE  Patient Care Team: [name], MD as PCP - General (Internal Medicine) [name], MD as Consulting Physician (Oncology)  CANCER STAGING   Cancer Staging  Multiple myeloma [facility]) Staging form: Plasma Cell Myeloma and Plasma Cell Disorders, AJCC 8th Edition - Clinical stage from 12/05/2022: RISS Stage II (Beta-2-microglobulin (mg/L): 3.7, Albumin (g/dL): 3.6, ISS: Stage II, High-risk cytogenetics: Absent, LDH: Normal) - Signed by [name], MD on 12/26/2022 Stage prefix: Initial diagnosis Beta 2 microglobulin range (mg/L): 3.5 to 5.49 Albumin range (g/dL): Greater than or equal to 3.5 Cytogenetics: 1q addition, Other mutation   ASSESSMENT & PLAN:  Melvin Gilmore 82 y.o. male with pmh of hypertension, hyperlipidemia, anxiety, GERD, BPH, hypothyroidism, prostate cancer s/p HIFU was seen by primary on November 13, 2022 for acute onset of lower back pain.  Was referred to medical oncology for finding of L1 lesion suspicious for metastasis.  # Multiple myeloma, RISS Stage II -MRI thoracic and lumbar spine with and without contrast was reviewed. Showed unchanged subacute compression fracture at T12.  Focal enhancing marrow replacing lesions in T6, T8, T5, T2 all suspicious for metastatic disease.  L1 lesion measuring 16 mm.  Multilevel degenerative changes present.  -SPEP/IFE showed biclonal IgA kappa paraprotein with M spike 2.1 g/dL and second spike at 0.4 g/dL.  Kappa 453, lambda 6.8 with a ratio of 66.7.  Iron panel and B12 are normal.  PSA 3.44.  CBC showed mild anemia 13.4, chronic.  On CMP normal creatinine and calcium levels.  Elevated total protein 8.8.  LDH normal.  Beta-2 microglobulin 3.7.  Albumin 3.6.  24-hour urine/UPEP showed 1.1 g protein with no M spike.  -BMBx Hypercellular marrow with 60% plasma cells.  IHC positive for CD138/ mum 1.  Kappa restricted consistent with multiple myeloma. L1 vertebral body biopsy showed numerous  plasma cells. Cytogenetics - del (13q) and gain 1.    -PET/CT showed activity in L1 and left eighth rib concerning for myeloma.  -Started on Dara-RD on 12/26/2022.  Completed cycle 1 of daratumumab -> then hospitalized for urinary retention, constipation and back pain -> inpatient hospitalist team discussed with patient and transition him to hospice -> patient revoked hospice on 01/31/2023 since he was feeling better.  Multiple treatment interruptions due to UTIs and hospitalization for stroke.  -Patient seen today for cycle 2-day 15 of daratumumab.  Patient complaining of dysuria, cloudy urine with odor.  Concerned about another UTI.  Defer treatment by 1 week.  Patient has reached out to urology and they would like to schedule for gemcitabine injection today.  -Revlimid 2.5 mg 3-week on 1 week off on hold.  Will continue to hold for at least 1 month from his stroke (stroke is unlikely to be related to Revlimid since he was not taking it at the time of the event.)  -Showing response to treatment.  Kappa chains decreasing and M spike decreased from 2.1 to 0.2.   # Recurrent UTIs -Follows with Dr. Lonna Cobb.  Previously treated with Ceftin and ciprofloxacin.  Urine culture grew E. coli Pseudomonas in the past. -Having UTI symptoms again today.  Urology planning for gemcitabine injection.  # Stroke - Admitted 03/07/2023 with acute onset aphasia and right hemianopia. s/p tPA with complete resolution of symptoms.  MRI brain negative.  Echo unremarkable.  On aspirin and Plavix for 21 days then aspirin alone.  On Lipitor 40 mg daily.  Scheduled to follow-up with Dr. Juliann Pares cardiology to discuss about  ILR implantation. -I will hold Revlimid for at least 1 month poststroke.  # Intractable hiccups -Secondary to dexamethasone. -Tolerates split dose dexamethasone 8 mg on day 1 and 2  # Vertebral compression fractures - Advised to obtain dental clearance prior to starting Zometa  # T12 pathological  fracture # Cancer-related pain - s/p kyphoplasty by Dr. Elijio Miles on 12/06/22.  No improvement in pain -Completed palliative RT on 01/20/2023.  -Reports significant improvement in pain.  Completely off opioids now.  # Opioid-induced constipation -Manageable with senna, MiraLAX, lactulose, Movantik as needed.  # History of prostate cancer - follows with Dr. Evelene Croon, urology for elevated PSA.  MRI prostate showed 1 mm category 5 lesion of anterior transition zone.  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 s/p HIFU of the prostate on 02/07/2022.  PSA level from 10/08/2022 was 3.34.  -PSA from 11/21/2022 3.44.  # Poor appetite # Weight loss -Could be from cancer and uncontrolled pain.  Pain management as above. Nutrition following. -He is taking Marinol 2.5 mg once daily.   # Anxiety -He is taking Ambien 5 mg nightly prescribed by PCP. -Previously, he was taking Xanax 0.25 mg at night.  I will not renew that prescription.  Orders Placed This Encounter  Procedures   Comprehensive metabolic panel    Standing Status:   Future    Standing Expiration Date:   03/20/2024   CMP (Cancer Center only)    Standing Status:   Future    Number of Occurrences:   1    Standing Expiration Date:   03/20/2024   CBC with Differential (Cancer Center Only)    Standing Status:   Future    Standing Expiration Date:   03/27/2024   CBC with Differential (Cancer Center Only)    Standing Status:   Future    Standing Expiration Date:   04/03/2024   RTC in 1 week for MD visit, labs, daratumumab RTC in 2 weeks for MD visit, labs, daratumumab  The total time spent in the appointment was 30 minutes encounter with patients including review of chart and various tests results, discussions about plan of care and coordination of care plan   All questions were answered. The patient knows to call the clinic with any problems, questions or concerns. No barriers to learning was  detected.  [name], MD 8/30/202412:15 PM   HISTORY OF PRESENTING ILLNESS:  Melvin Gilmore 82 y.o. male with past medical history of hypertension, hyperlipidemia, anxiety, GERD, BPH, hypothyroidism, prostate cancer s/p HIFU was seen by primary on November 13, 2022 for acute onset of lower back pain.  Patient reports that he was sitting outside and had violent sneeze which was followed by severe back pain.  He has fallen allergies.  Had x-rays followed by MRI thoracic spine without contrast.  It showed compression fracture of T12 superior endplate with surrounding marrow edema likely subacute.  15 mm T1 hypointense marrow lesion in the anterior aspect of L1 suspicious for metastasis.  Further characterization with postcontrast MRI recommended.  Prostate cancer-follows with Dr. Evelene Croon, urology for elevated PSA.  MRI prostate showed 1 mm category 5 lesion of anterior transition zone.  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 s/p HIFU of the prostate on 02/07/2022.  PSA level from 10/08/2022 was 3.34.  Interval history Patient was seen today accompanied with wife for labs and daratumumab. He has been feeling significantly better since the hospitalization for  stroke.  No residual deficits.  Denies any pain in his back.  He is completely off the opioids.  He was walking with the help of rolling walker.  No longer in the wheelchair.  Eating better.  Tolerating split dose of dexamethasone.  Today his major complaint is dysuria, odor in urine.  And he is having generalized aches mainly at the nighttime.  This is started after statin treatment.  I discussed about reaching out to either cardiology or PCP if anything needs to be changed.  I have reviewed his chart and materials related to his cancer extensively and collaborated history with the patient. Summary of oncologic history is as follows: Oncology History  Multiple myeloma (HCC)  12/05/2022  Cancer Staging   Staging form: Plasma Cell Myeloma and Plasma Cell Disorders, AJCC 8th Edition - Clinical stage from 12/05/2022: RISS Stage II (Beta-2-microglobulin (mg/L): 3.7, Albumin (g/dL): 3.6, ISS: Stage II, High-risk cytogenetics: Absent, LDH: Normal) - Signed by [name], MD on 12/26/2022 Stage prefix: Initial diagnosis Beta 2 microglobulin range (mg/L): 3.5 to 5.49 Albumin range (g/dL): Greater than or equal to 3.5 Cytogenetics: 1q addition, Other mutation   12/13/2022 Initial Diagnosis   Multiple myeloma (HCC)   12/26/2022 - 01/24/2023 Chemotherapy   Patient is on Treatment Plan : MYELOMA NEWLY DIAGNOSED Daratumumab IV + Lenalidomide + Dexamethasone Weekly (DaraRd) q28d     02/07/2023 - 02/07/2023 Chemotherapy   Patient is on Treatment Plan : MYELOMA NON-TRANSPLANT CANDIDATES VRd weekly q21d     02/13/2023 -  Chemotherapy   Patient is on Treatment Plan : MYELOMA NEWLY DIAGNOSED Daratumumab IV + Lenalidomide + Dexamethasone Weekly (DaraRd) q28d       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   Nausea vomiting and diarrhea 01/26/2023   Prostate cancer (HCC) 2023   Seasonal allergies    Skin cancer    a.) ears   Wears partial dentures    lower    SURGICAL HISTORY: Past Surgical History:  Procedure Laterality Date   APPENDECTOMY     CATARACT EXTRACTION W/PHACO Right 01/15/2016   Procedure: CATARACT EXTRACTION PHACO AND INTRAOCULAR LENS PLACEMENT (IOC);  Surgeon: Sherald Hess, MD;  Location: [facility];   Service: Ophthalmology;  Laterality: Right;  RIGHT CALL CELL PHONE WITH TIME   CHOLECYSTECTOMY     COLONOSCOPY     COLONOSCOPY WITH ESOPHAGOGASTRODUODENOSCOPY (EGD)     ESOPHAGOGASTRODUODENOSCOPY  07/2021   ESOPHAGOGASTRODUODENOSCOPY (EGD) WITH PROPOFOL N/A 09/23/2017   Procedure: ESOPHAGOGASTRODUODENOSCOPY (EGD) WITH PROPOFOL;  Surgeon: Toledo, Boykin Nearing, MD;  Location: [facility];  Service: Gastroenterology;  Laterality: N/A;   HIGH INTENSITY FOCUSED ULTRASOUND (HIFU) OF THE PROSTATE N/A 02/07/2022   Procedure: HIGH INTENSITY FOCUSED ULTRASOUND (HIFU) OF THE PROSTATE;  Surgeon: Orson Ape, MD;  Location: [facility];  Service: Urology;  Laterality: N/A;   HYDROCELE EXCISION / REPAIR     IR KYPHO THORACIC WITH BONE BIOPSY  12/06/2022  IR RADIOLOGIST EVAL & MGMT  11/21/2022   LASIX RT EYE     PROSTATE BIOPSY N/A 01/03/2022   Procedure: PROSTATE BIOPSY  Addison Laroque;  Surgeon: Orson Ape, MD;  Location: [facility];  Service: Urology;  Laterality: N/A;   ROTATOR CUFF REPAIR Left 2001    SOCIAL HISTORY: Social History   Socioeconomic History   Marital status: Married    Spouse name: Not on file   Number of children: Not on file   Years of education: Not on file   Highest education level: Not on file  Occupational History   Not on file  Tobacco Use   Smoking status: Never   Smokeless tobacco: Never  Vaping Use   Vaping status: Never Used  Substance and Sexual Activity   Alcohol use: Yes    Alcohol/week: 7.0 standard drinks of alcohol    Types: 7 Glasses of wine per week    Comment: wine occ   Drug use: No   Sexual activity: Not on file  Other Topics Concern   Not on file  Social History Narrative   Not on file   Social Determinants of Health   Financial Resource Strain: Low Risk  (11/21/2022)   Overall Financial Resource Strain (CARDIA)    Difficulty of Paying Living Expenses: Not hard at all  Food Insecurity: Patient Unable To Answer (03/07/2023)   Hunger Vital Sign     Worried About Running Out of Food in the Last Year: Patient unable to answer    Ran Out of Food in the Last Year: Patient unable to answer  Transportation Needs: Patient Unable To Answer (03/07/2023)   PRAPARE - Transportation    Lack of Transportation (Medical): Patient unable to answer    Lack of Transportation (Non-Medical): Patient unable to answer  Physical Activity: Not on file  Stress: Not on file  Social Connections: Not on file  Intimate Partner Violence: Patient Unable To Answer (03/07/2023)   Humiliation, Afraid, Rape, and Kick questionnaire    Fear of Current or Ex-Partner: Patient unable to answer    Emotionally Abused: Patient unable to answer    Physically Abused: Patient unable to answer    Sexually Abused: Patient unable to answer    FAMILY HISTORY: Family History  Problem Relation Age of Onset   Pneumonia Father     ALLERGIES:  is allergic to ciprofloxacin, amoxicillin-pot clavulanate, and meloxicam.  MEDICATIONS:  Current Outpatient Medications  Medication Sig Dispense Refill   acetaminophen (TYLENOL) 500 MG tablet Take 500 mg by mouth every 6 (six) hours as needed for mild pain, moderate pain, fever or headache.     acyclovir (ZOVIRAX) 400 MG tablet Take 400 mg by mouth 2 (two) times daily.     ALPRAZolam (XANAX) 0.25 MG tablet Take 1 tablet (0.25 mg total) by mouth at bedtime as needed for anxiety. 30 tablet 0   atorvastatin (LIPITOR) 40 MG tablet Take 1 tablet (40 mg total) by mouth daily. 30 tablet 2   clopidogrel (PLAVIX) 75 MG tablet Take 1 tablet (75 mg total) by mouth daily for 21 days. 21 tablet 0   dexamethasone (DECADRON) 4 MG tablet Take 2 tablets (8 mg total) by mouth daily for 30 doses. Take 2 tablets once daily for 2 days after your daratumumab treatment 30 tablet 0   levothyroxine (SYNTHROID, LEVOTHROID) 50 MCG tablet Take 50 mcg by mouth daily before breakfast.     pantoprazole (PROTONIX) 40 MG tablet Take 40 mg by mouth daily as  needed.      Probiotic Product (PROBIOTIC ADVANCED PO) Take 1 tablet by mouth at bedtime.     senna (SENOKOT) 8.6 MG tablet Take 2 tablets (17.2 mg total) by mouth 2 (two) times daily. May crush, mix with water and give sublingually if needed. 28 tablet 0   Current Facility-Administered Medications  Medication Dose Route Frequency Provider Last Rate Last Admin   gentamicin (GARAMYCIN) injection 80 mg  80 mg Intramuscular Once McGowan, Shannon A, PA-C        REVIEW OF SYSTEMS:   Pertinent information mentioned in HPI All other systems were reviewed with the patient and are negative.  PHYSICAL EXAMINATION: ECOG PERFORMANCE STATUS: 1 - Symptomatic but completely ambulatory  Vitals:   03/21/23 1020  BP: 121/74  Pulse: 90  Temp: 97.6 F (36.4 C)  SpO2: 100%      Filed Weights   03/21/23 1020  Weight: 130 lb (59 kg)       GENERAL:alert, no distress and comfortable SKIN: skin color, texture, turgor are normal, no rashes or significant lesions EYES: normal, conjunctiva are pink and non-injected, sclera clear OROPHARYNX:no exudate, no erythema and lips, buccal mucosa, and tongue normal  NECK: supple, thyroid normal size, non-tender, without nodularity LYMPH:  no palpable lymphadenopathy in the cervical, axillary or inguinal LUNGS: clear to auscultation and percussion with normal breathing effort HEART: regular rate & rhythm and no murmurs and no lower extremity edema ABDOMEN:abdomen soft, non-tender and normal bowel sounds Musculoskeletal:no cyanosis of digits and no clubbing  PSYCH: alert & oriented x 3 with fluent speech NEURO: no focal motor/sensory deficits  LABORATORY DATA:  I have reviewed the data as listed Lab Results  Component Value Date   WBC 5.2 03/21/2023   HGB 14.0 03/21/2023   HCT 40.9 03/21/2023   MCV 98.8 03/21/2023   PLT 146 (L) 03/21/2023   Recent Labs    02/21/23 0916 03/07/23 0926 03/07/23 1023 03/08/23 0520 03/21/23 0935  NA 129*   < > 126* 131* 128*   K 3.3*   < > 3.4* 4.6 4.5  CL 98   < > 97* 104 95*  CO2 23   < > 17* 21* 25  GLUCOSE 113*   < > 105* 88 95  BUN 12   < > 10 9 16   CREATININE 0.92   < > 0.83 0.77 1.00  CALCIUM 8.7*   < > 8.5* 8.9 9.3  GFRNONAA >60   < > >60 >60 >60  PROT 5.8*  --  6.1*  --  6.8  ALBUMIN 3.5  --  3.6 3.3* 4.4  AST 32  --  29  --  30  ALT 32  --  21  --  28  ALKPHOS 91  --  78  --  100  BILITOT 0.8  --  1.2  --  0.6   < > = values in this interval not displayed.    RADIOGRAPHIC STUDIES: I have personally reviewed the radiological images as listed and agreed with the findings in the report. ECHOCARDIOGRAM COMPLETE  Result Date: 03/08/2023    ECHOCARDIOGRAM REPORT   Patient Name:   Melvin Gilmore Date of Exam: 03/08/2023 Medical Rec #:  [id number]        Height:       71.0 in Accession #:    [id number]       Weight:       131.0 lb Date of Birth:  1940/09/24  BSA:          1.762 m Patient Age:    81 years         BP:           126/73 mmHg Patient Gender: M                HR:           61 bpm. Exam Location:  [facility] Procedure: 2D Echo, Cardiac Doppler, Color Doppler and Strain Analysis Indications:     Stroke 434.91 / I163.9  History:         Patient has no prior history of Echocardiogram examinations.                  Signs/Symptoms:Chest Pain; Risk Factors:Hypertension.  Sonographer:     Neysa Bonito Roar Referring Phys:  [phone] MCNEILL P KIRKPATRICK Diagnosing Phys: Armanda Magic MD  Sonographer Comments: Global longitudinal strain was attempted. IMPRESSIONS  1. Left ventricular ejection fraction, by estimation, is 60 to 65%. The left ventricle has normal function. The left ventricle has no regional wall motion abnormalities. Left ventricular diastolic parameters are consistent with Grade I diastolic dysfunction (impaired relaxation). The average left ventricular global longitudinal strain is -16.7 %. The global longitudinal strain is normal.  2. Right ventricular systolic function is normal. The right ventricular  size is normal.  3. The mitral valve is normal in structure. Mild mitral valve regurgitation. No evidence of mitral stenosis.  4. The aortic valve is tricuspid. Aortic valve regurgitation is trivial. Aortic valve sclerosis/calcification is present, without any evidence of aortic stenosis. Aortic valve area, by VTI measures 2.80 cm. Aortic valve mean gradient measures 2.0 mmHg.  Aortic valve Vmax measures 0.94 m/s.  5. Aortic dilatation noted. There is mild dilatation of the aortic root, measuring 39 mm. FINDINGS  Left Ventricle: Left ventricular ejection fraction, by estimation, is 60 to 65%. The left ventricle has normal function. The left ventricle has no regional wall motion abnormalities. The average left ventricular global longitudinal strain is -16.7 %. The global longitudinal strain is normal. The left ventricular internal cavity size was normal in size. There is no left ventricular hypertrophy. Left ventricular diastolic parameters are consistent with Grade I diastolic dysfunction (impaired relaxation). Normal left ventricular filling pressure. Right Ventricle: The right ventricular size is normal. No increase in right ventricular wall thickness. Right ventricular systolic function is normal. Left Atrium: Left atrial size was normal in size. Right Atrium: Right atrial size was normal in size. Pericardium: There is no evidence of pericardial effusion. Mitral Valve: The mitral valve is normal in structure. Mild mitral annular calcification. Mild mitral valve regurgitation. No evidence of mitral valve stenosis. Tricuspid Valve: The tricuspid valve is normal in structure. Tricuspid valve regurgitation is trivial. No evidence of tricuspid stenosis. Aortic Valve: The aortic valve is tricuspid. Aortic valve regurgitation is trivial. Aortic valve sclerosis/calcification is present, without any evidence of aortic stenosis. Aortic valve mean gradient measures 2.0 mmHg. Aortic valve peak gradient measures 3.5 mmHg.  Aortic valve area, by VTI measures 2.80 cm. Pulmonic Valve: The pulmonic valve was normal in structure. Pulmonic valve regurgitation is mild. No evidence of pulmonic stenosis. Aorta: Aortic dilatation noted. There is mild dilatation of the aortic root, measuring 39 mm. Venous: The inferior vena cava was not well visualized. IAS/Shunts: No atrial level shunt detected by color flow Doppler.  LEFT VENTRICLE PLAX 2D LVIDd:         4.30 cm   Diastology LVIDs:  3.10 cm   LV e' medial:    8.70 cm/s LV PW:         0.80 cm   LV E/e' medial:  8.0 LV IVS:        0.90 cm   LV e' lateral:   7.51 cm/s LVOT diam:     2.00 cm   LV E/e' lateral: 9.3 LV SV:         47 LV SV Index:   27        2D Longitudinal Strain LVOT Area:     3.14 cm  2D Strain GLS (A2C):   -17.0 %                          2D Strain GLS (A3C):   -17.4 %                          2D Strain GLS (A4C):   -15.7 %                          2D Strain GLS Avg:     -16.7 % RIGHT VENTRICLE RV Basal diam:  2.80 cm RV Mid diam:    2.70 cm RV S prime:     13.70 cm/s TAPSE (M-mode): 2.1 cm LEFT ATRIUM             Index        RIGHT ATRIUM           Index LA diam:        3.00 cm 1.70 cm/m   RA Area:     14.10 cm LA Vol (A2C):   39.0 ml 22.14 ml/m  RA Volume:   30.80 ml  17.48 ml/m LA Vol (A4C):   34.2 ml 19.42 ml/m LA Biplane Vol: 37.1 ml 21.06 ml/m  AORTIC VALVE                    PULMONIC VALVE AV Area (Vmax):    2.66 cm     PV Vmax:          0.90 m/s AV Area (Vmean):   2.52 cm     PV Peak grad:     3.2 mmHg AV Area (VTI):     2.80 cm     PR End Diast Vel: 4.08 msec AV Vmax:           94.20 cm/s   RVOT Peak grad:   2 mmHg AV Vmean:          61.500 cm/s AV VTI:            0.168 m AV Peak Grad:      3.5 mmHg AV Mean Grad:      2.0 mmHg LVOT Vmax:         79.80 cm/s LVOT Vmean:        49.300 cm/s LVOT VTI:          0.150 m LVOT/AV VTI ratio: 0.89  AORTA Ao Root diam: 3.10 cm Ao Asc diam:  3.20 cm MITRAL VALVE               TRICUSPID VALVE MV Area (PHT): 3.10 cm     TR Peak grad:   19.9 mmHg MV Decel Time: 245 msec    TR Vmax:        223.00 cm/s MV E velocity:  69.70 cm/s MV A velocity: 95.90 cm/s  SHUNTS MV E/A ratio:  0.73        Systemic VTI:  0.15 m MV A Prime:    10.1 cm/s   Systemic Diam: 2.00 cm Armanda Magic MD Electronically signed by Armanda Magic MD Signature Date/Time: 03/08/2023/5:43:04 PM    Final    MR BRAIN WO CONTRAST  Result Date: 03/08/2023 CLINICAL DATA:  Stroke, follow-up. Acute onset aphasia and right hemianopia with marked improvement following IV tenecteplase. EXAM: MRI HEAD WITHOUT CONTRAST TECHNIQUE: Multiplanar, multiecho pulse sequences of the brain and surrounding structures were obtained without intravenous contrast. COMPARISON:  Head CT and CTA head/neck 03/07/2023. MRI brain 06/24/2020. FINDINGS: Brain: No acute infarct or hemorrhage. Stable mild chronic small-vessel disease. No mass or midline shift. No hydrocephalus or extra-axial collection. No abnormal susceptibility. Vascular: Normal flow voids. Skull and upper cervical spine: Normal marrow signal. Sinuses/Orbits: No acute findings. Other: None. IMPRESSION: No acute intracranial process. Electronically Signed   By: Orvan Falconer M.D.   On: 03/08/2023 11:19   CT HEAD WO CONTRAST ( )  Result Date: 03/07/2023 CLINICAL DATA:  Stroke, follow-up. Dysphagia beginning at 1 p.m. today. Patient previously was treated with TIA head a EXAM: CT HEAD WITHOUT CONTRAST TECHNIQUE: Contiguous axial images were obtained from the base of the skull through the vertex without intravenous contrast. RADIATION DOSE REDUCTION: This exam was performed according to the departmental dose-optimization program which includes automated exposure control, adjustment of the mA and/or kV according to patient size and/or use of iterative reconstruction technique. COMPARISON:  CT head without contrast 03/07/2023. CT angio head and neck 03/07/2023. FINDINGS: Brain: No acute infarct, hemorrhage, or mass lesion is  present. Mild periventricular white matter hypoattenuation is stable. Deep brain nuclei are within normal limits. No significant extraaxial fluid collection is present. The ventricles are proportionate to the degree of atrophy. The brainstem and cerebellum are within normal limits. Midline structures are within normal limits. Vascular: Choose scratched at atherosclerotic calcifications are present within the cavernous internal carotid arteries bilaterally. No hyperdense vessel is present. Skull: Calvarium is intact. No focal lytic or blastic lesions are present. No significant extracranial soft tissue lesion is present. Sinuses/Orbits: The paranasal sinuses and mastoid air cells are clear. A right lens replacement is present. Globes and orbits are otherwise within normal limits. IMPRESSION: 1. No acute intracranial abnormality or significant interval change. 2. Stable mild periventricular white matter disease. 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 Electronically Signed   By: Marin Roberts M.D.   On: 03/07/2023 16:20   EEG adult  Result Date: 03/07/2023 Rejeana Brock, MD     03/07/2023  3:49 PM History: 82 year old male who presented with acute aphasia Sedation: None Technique: This EEG was acquired with electrodes placed according to the International 10-20 electrode system (including Fp1, Fp2, F3, F4, C3, C4, P3, P4, O1, O2, T3, T4, T5, T6, A1, A2, Fz, Cz, Pz). The following electrodes were missing or displaced: none. Patient State: Awake and drowsy Background: There is a posterior dominant rhythm of 8 to 9 Hz.  In addition, there is generalized frontally predominant irregular slow activity throughout the recording.  This pattern was present throughout the recording. Photic stimulation: Physiologic driving is not performed EEG Abnormalities: 1) generalized irregular slow activity Clinical Interpretation: This EEG is consistent with a mild generalized nonspecific  cerebral dysfunction (encephalopathy).   There was no seizure or seizure predisposition recorded on this study. Please note that lack of epileptiform activity  on EEG does not preclude the possibility of epilepsy. Ritta Slot, MD [facility] [phone] If 7pm- 7am, please page neurology on call as listed in AMION.  CT HEAD CODE STROKE WO CONTRAST  Result Date: 03/07/2023 CLINICAL DATA:  Code stroke. Disorientation, right hemianopsia, and aphasia EXAM: CT ANGIOGRAPHY HEAD AND NECK WITH AND WITHOUT CONTRAST TECHNIQUE: Multidetector CT imaging of the head and neck was performed using the standard protocol during bolus administration of intravenous contrast. Multiplanar CT image reconstructions and MIPs were obtained to evaluate the vascular anatomy. Carotid stenosis measurements (when applicable) are obtained utilizing NASCET criteria, using the distal internal carotid diameter as the denominator. RADIATION DOSE REDUCTION: This exam was performed according to the departmental dose-optimization program which includes automated exposure control, adjustment of the mA and/or kV according to patient size and/or use of iterative reconstruction technique. CONTRAST:  75mL OMNIPAQUE IOHEXOL 350 MG/ML SOLN COMPARISON:  CT head 08/21/2022 FINDINGS: CT HEAD FINDINGS Brain: There is no acute intracranial hemorrhage, extra-axial fluid collection, or acute infarct. Parenchymal volume is within expected limits for age. The ventricles are normal in size. Gray-white differentiation is preserved The pituitary and suprasellar region are normal. There is no mass lesion. There is no mass effect or midline shift. Vascular: No hyperdense vessel or unexpected calcification. Skull: Normal. Negative for fracture or focal lesion. Sinuses/Orbits: The imaged paranasal sinuses are clear. A right lens implant is noted. The globes and orbits are otherwise unremarkable. Other: The mastoid air cells and middle ear cavities are  clear. ASPECTS [facility] Stroke Program Early CT Score) - Ganglionic level infarction (caudate, lentiform nuclei, internal capsule, insula, M1-M3 cortex): 7 - Supraganglionic infarction (M4-M6 cortex): 3 Total score (0-10 with 10 being normal): 10 CTA NECK FINDINGS Aortic arch: There is a minimal calcified plaque in the imaged aortic arch. The origins of the major branch vessels are patent. The subclavian arteries are patent to the level imaged. Right carotid system: The right common, internal, and external carotid arteries are patent, with minimal plaque at the bifurcation but no hemodynamically significant stenosis or occlusion. There is no evidence of dissection or aneurysm. Left carotid system: The left common, internal, and external carotid arteries are patent, with minimal plaque at the bifurcation but no hemodynamically significant stenosis or occlusion. There is no evidence of dissection or aneurysm. Vertebral arteries: The vertebral arteries are patent, without hemodynamically significant stenosis or occlusion there is no evidence of dissection or aneurysm. Skeleton: There is mild compression deformity of the superior T3 and T4 endplates, similar to the prior thoracic spine MRI. There are diffuse small lucencies throughout the imaged spine consistent with myeloma lesions. There is no definite soft tissue component within the spinal canal. Other neck: The soft tissues of the neck are unremarkable. Upper chest: There is scarring in both lung apices. Review of the MIP images confirms the above findings CTA HEAD FINDINGS Anterior circulation: There is mild calcified plaque in the intracranial ICAs without significant stenosis or occlusion. The bilateral MCAs and ACAS are patent, without proximal stenosis or occlusion. There is a common A2 trunk, a normal variant. There is no aneurysm or AVM. Posterior circulation: There is diminutive enhancement of the right V4 segment, favored predominantly congenital with  possible superimposed atherosclerosis. The left V4 segment is patent. The basilar artery is patent. The major cerebellar arteries are patent. The bilateral PCAs are patent, predominantly supplied by posterior communicating arteries bilaterally (fetal origins). There is no proximal stenosis or occlusion. There is no aneurysm or AVM. Venous sinuses:  Suboptimally evaluated due to bolus timing. Anatomic variants: As above. Review of the MIP images confirms the above findings IMPRESSION: 1. No acute intracranial pathology. 2. Patent vasculature of the head and neck, with no hemodynamically significant stenosis, occlusion, or dissection. 3. Diffuse lucencies throughout the imaged spine consistent with myeloma lesions. Findings of the initial noncontrast head CT communicated to Dr Amada Jupiter at 10:00 am. The CTA results were discussed at approximately 10:15 am. Electronically Signed   By: Lesia Hausen M.D.   On: 03/07/2023 10:28   CT ANGIO HEAD NECK W WO CM (CODE STROKE)  Result Date: 03/07/2023 CLINICAL DATA:  Code stroke. Disorientation, right hemianopsia, and aphasia EXAM: CT ANGIOGRAPHY HEAD AND NECK WITH AND WITHOUT CONTRAST TECHNIQUE: Multidetector CT imaging of the head and neck was performed using the standard protocol during bolus administration of intravenous contrast. Multiplanar CT image reconstructions and MIPs were obtained to evaluate the vascular anatomy. Carotid stenosis measurements (when applicable) are obtained utilizing NASCET criteria, using the distal internal carotid diameter as the denominator. RADIATION DOSE REDUCTION: This exam was performed according to the departmental dose-optimization program which includes automated exposure control, adjustment of the mA and/or kV according to patient size and/or use of iterative reconstruction technique. CONTRAST:  75mL OMNIPAQUE IOHEXOL 350 MG/ML SOLN COMPARISON:  CT head 08/21/2022 FINDINGS: CT HEAD FINDINGS Brain: There is no acute intracranial  hemorrhage, extra-axial fluid collection, or acute infarct. Parenchymal volume is within expected limits for age. The ventricles are normal in size. Gray-white differentiation is preserved The pituitary and suprasellar region are normal. There is no mass lesion. There is no mass effect or midline shift. Vascular: No hyperdense vessel or unexpected calcification. Skull: Normal. Negative for fracture or focal lesion. Sinuses/Orbits: The imaged paranasal sinuses are clear. A right lens implant is noted. The globes and orbits are otherwise unremarkable. Other: The mastoid air cells and middle ear cavities are clear. ASPECTS [facility] Stroke Program Early CT Score) - Ganglionic level infarction (caudate, lentiform nuclei, internal capsule, insula, M1-M3 cortex): 7 - Supraganglionic infarction (M4-M6 cortex): 3 Total score (0-10 with 10 being normal): 10 CTA NECK FINDINGS Aortic arch: There is a minimal calcified plaque in the imaged aortic arch. The origins of the major branch vessels are patent. The subclavian arteries are patent to the level imaged. Right carotid system: The right common, internal, and external carotid arteries are patent, with minimal plaque at the bifurcation but no hemodynamically significant stenosis or occlusion. There is no evidence of dissection or aneurysm. Left carotid system: The left common, internal, and external carotid arteries are patent, with minimal plaque at the bifurcation but no hemodynamically significant stenosis or occlusion. There is no evidence of dissection or aneurysm. Vertebral arteries: The vertebral arteries are patent, without hemodynamically significant stenosis or occlusion there is no evidence of dissection or aneurysm. Skeleton: There is mild compression deformity of the superior T3 and T4 endplates, similar to the prior thoracic spine MRI. There are diffuse small lucencies throughout the imaged spine consistent with myeloma lesions. There is no definite soft tissue  component within the spinal canal. Other neck: The soft tissues of the neck are unremarkable. Upper chest: There is scarring in both lung apices. Review of the MIP images confirms the above findings CTA HEAD FINDINGS Anterior circulation: There is mild calcified plaque in the intracranial ICAs without significant stenosis or occlusion. The bilateral MCAs and ACAS are patent, without proximal stenosis or occlusion. There is a common A2 trunk, a normal variant. There is no aneurysm  or AVM. Posterior circulation: There is diminutive enhancement of the right V4 segment, favored predominantly congenital with possible superimposed atherosclerosis. The left V4 segment is patent. The basilar artery is patent. The major cerebellar arteries are patent. The bilateral PCAs are patent, predominantly supplied by posterior communicating arteries bilaterally (fetal origins). There is no proximal stenosis or occlusion. There is no aneurysm or AVM. Venous sinuses: Suboptimally evaluated due to bolus timing. Anatomic variants: As above. Review of the MIP images confirms the above findings IMPRESSION: 1. No acute intracranial pathology. 2. Patent vasculature of the head and neck, with no hemodynamically significant stenosis, occlusion, or dissection. 3. Diffuse lucencies throughout the imaged spine consistent with myeloma lesions. Findings of the initial noncontrast head CT communicated to Dr Amada Jupiter at 10:00 am. The CTA results were discussed at approximately 10:15 am. Electronically Signed   By: Lesia Hausen M.D.   On: 03/07/2023 10:28

## 2023-03-25 ENCOUNTER — Telehealth: Payer: Self-pay | Admitting: *Deleted

## 2023-03-25 NOTE — Telephone Encounter (Signed)
Call returned to wife and I got her voice mail. I left message asking her to please call Dr Judithann Sheen regarding the Lipitor problem and to call back if she has any questions

## 2023-03-25 NOTE — Telephone Encounter (Signed)
Patient wife called reporting that he is not tolerating, having a lot of side effects the Lipitor 40 mg and states that he cannot get in with Cardiology until October and she is asking what to do, Please advise

## 2023-03-26 ENCOUNTER — Ambulatory Visit: Payer: Medicare HMO | Admitting: Urology

## 2023-03-26 ENCOUNTER — Encounter: Payer: Self-pay | Admitting: Urology

## 2023-03-26 VITALS — BP 144/81 | HR 76 | Ht 71.0 in | Wt 130.0 lb

## 2023-03-26 DIAGNOSIS — N390 Urinary tract infection, site not specified: Secondary | ICD-10-CM

## 2023-03-26 DIAGNOSIS — Z87898 Personal history of other specified conditions: Secondary | ICD-10-CM

## 2023-03-26 DIAGNOSIS — R339 Retention of urine, unspecified: Secondary | ICD-10-CM

## 2023-03-26 LAB — URINALYSIS, COMPLETE
Bilirubin, UA: NEGATIVE
Glucose, UA: NEGATIVE
Ketones, UA: NEGATIVE
Nitrite, UA: POSITIVE — AB
Protein,UA: NEGATIVE
Specific Gravity, UA: 1.01 (ref 1.005–1.030)
Urobilinogen, Ur: 0.2 mg/dL (ref 0.2–1.0)
pH, UA: 6.5 (ref 5.0–7.5)

## 2023-03-26 LAB — CULTURE, URINE COMPREHENSIVE

## 2023-03-26 LAB — MICROSCOPIC EXAMINATION: WBC, UA: >30 /HPF — AB (ref 0–5)

## 2023-03-26 LAB — BLADDER SCAN AMB NON-IMAGING: PVR: 78 WU

## 2023-03-26 NOTE — Progress Notes (Signed)
I,[name],acting as a scribe for [name], MD.,have documented all relevant documentation on the behalf of [name], MD,as directed by  [name], MD while in the presence of [name], MD.  03/26/2023 9:15 PM   Melvin Gilmore [birth year] [id number]  Referring provider: [name], MD [address] [facility] [place],  [place] [address]  Chief Complaint  Patient presents with   Follow-up    HPI: Melvin Gilmore is a 82 y.o. male presents for follow up.  Initially seen 8/1/24for urinary retention-refer to that note for details. His follow up PVR was 161 mL. He had an acute visit with out PA on 02/25/23 complaining of dysuria and UA with significant pyuria. He was started empirically on Sefton and urine culture grew E.coli and pseudomonas, which were both sensitive to fluoroquinolone. He was started on Cipro and had side effects of elevated blood pressure, shortness of breath, and diarrhea. His Cipro was discontinued and he was given Gentamicin in the office. On PA follow up, he received another injection of Gentamicin. Urine culture was ordered. The final report is pending with preliminary report growing E.coli without sensitivities identified. He is having mild dysuria, which has not worsened, but no fever or chills.    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   Nausea vomiting and diarrhea 01/26/2023   Prostate cancer (HCC) 2023   Seasonal allergies    Skin cancer    a.) ears   Wears partial  dentures    lower    Surgical History: Past Surgical History:  Procedure Laterality Date   APPENDECTOMY     CATARACT EXTRACTION W/PHACO Right 01/15/2016   Procedure: CATARACT EXTRACTION PHACO AND INTRAOCULAR LENS PLACEMENT (IOC);  Surgeon: Sherald Hess, MD;  Location: [facility];  Service: Ophthalmology;  Laterality: Right;  RIGHT CALL CELL PHONE WITH TIME   CHOLECYSTECTOMY     COLONOSCOPY     COLONOSCOPY WITH ESOPHAGOGASTRODUODENOSCOPY (EGD)     ESOPHAGOGASTRODUODENOSCOPY  07/2021   ESOPHAGOGASTRODUODENOSCOPY (EGD) WITH PROPOFOL N/A 09/23/2017   Procedure: ESOPHAGOGASTRODUODENOSCOPY (EGD) WITH PROPOFOL;  Surgeon: Toledo, Boykin Nearing, MD;  Location: [facility];  Service: Gastroenterology;  Laterality: N/A;   HIGH INTENSITY FOCUSED ULTRASOUND (HIFU) OF THE PROSTATE N/A 02/07/2022   Procedure: HIGH INTENSITY FOCUSED ULTRASOUND (HIFU) OF THE PROSTATE;  Surgeon: Orson Ape, MD;  Location: [facility];  Service: Urology;  Laterality: N/A;   HYDROCELE EXCISION / REPAIR     IR KYPHO THORACIC WITH BONE BIOPSY  12/06/2022   IR RADIOLOGIST EVAL & MGMT  11/21/2022   LASIX RT EYE     PROSTATE BIOPSY N/A 01/03/2022   Procedure: PROSTATE BIOPSY  Addison Jagodzinski;  Surgeon: Orson Ape, MD;  Location: [facility];  Service: Urology;  Laterality: N/A;   ROTATOR CUFF REPAIR Left 2001    Home Medications:  Allergies as of 03/26/2023  Reactions   Ciprofloxacin Diarrhea   He experienced an elevation in blood pressure, shortness of breath and diarrhea when he took the Cipro.    Amoxicillin-pot Clavulanate Rash, Other (See Comments)   Pt states he can't recall what the reactions were   Meloxicam Rash, Other (See Comments)        Medication List        Accurate as of March 26, 2023  9:15 PM. If you have any questions, ask your nurse or doctor.          acetaminophen 500 MG tablet Commonly known as: TYLENOL Take 500 mg by mouth every 6 (six) hours as needed for mild  pain, moderate pain, fever or headache.   acyclovir 400 MG tablet Commonly known as: ZOVIRAX Take 400 mg by mouth 2 (two) times daily.   ALPRAZolam 0.25 MG tablet Commonly known as: XANAX Take 1 tablet (0.25 mg total) by mouth at bedtime as needed for anxiety.   atorvastatin 40 MG tablet Commonly known as: LIPITOR Take 1 tablet (40 mg total) by mouth daily.   clopidogrel 75 MG tablet Commonly known as: Plavix Take 1 tablet (75 mg total) by mouth daily for 21 days.   levothyroxine 50 MCG tablet Commonly known as: SYNTHROID Take 50 mcg by mouth daily before breakfast.   pantoprazole 40 MG tablet Commonly known as: PROTONIX Take 40 mg by mouth daily as needed.   PROBIOTIC ADVANCED PO Take 1 tablet by mouth at bedtime.   senna 8.6 MG tablet Commonly known as: Senokot Take 2 tablets (17.2 mg total) by mouth 2 (two) times daily. May crush, mix with water and give sublingually if needed.        Allergies:  Allergies  Allergen Reactions   Ciprofloxacin Diarrhea    He experienced an elevation in blood pressure, shortness of breath and diarrhea when he took the Cipro.    Amoxicillin-Pot Clavulanate Rash and Other (See Comments)    Pt states he can't recall what the reactions were   Meloxicam Rash and Other (See Comments)    Family History: Family History  Problem Relation Age of Onset   Pneumonia Father     Social History:  reports that he has never smoked. He has never used smokeless tobacco. He reports current alcohol use of about 7.0 standard drinks of alcohol per week. He reports that he does not use drugs.   Physical Exam: BP (!) 144/81   Pulse 76   Ht 5\' 11"  (1.803 m)   Wt 130 lb (59 kg)   BMI 18.13 kg/m   Constitutional:  Alert and oriented, No acute distress. HEENT: [place] AT Respiratory: Normal respiratory effort, no increased work of breathing. Psychiatric: Normal mood and affect.   Assessment & Plan:    1. UTI Urine culture growing E.coli, final  sensitivity pending. Hopefully this will be back in the next 24-48 hours.  2. Urinary retention PVR today 78 mL.      [facility] [address], [address] [place], [place] [address] [phone]

## 2023-03-27 ENCOUNTER — Telehealth: Payer: Self-pay | Admitting: Urology

## 2023-03-27 ENCOUNTER — Encounter: Payer: Self-pay | Admitting: Urology

## 2023-03-27 ENCOUNTER — Ambulatory Visit: Payer: Medicare HMO | Admitting: Urology

## 2023-03-27 ENCOUNTER — Telehealth: Payer: Self-pay | Admitting: *Deleted

## 2023-03-27 MED ORDER — DOXYCYCLINE HYCLATE 100 MG PO TABS: 100.0000 mg | ORAL_TABLET | Freq: Two times a day (BID) | ORAL | 0 refills | Status: DC

## 2023-03-27 NOTE — Addendum Note (Signed)
Addended by: Levada Schilling on: 03/27/2023 10:38 AM   Modules accepted: Orders

## 2023-03-27 NOTE — Telephone Encounter (Signed)
Wife called reporting that patient has a UTI and is on Doxycycline and is asking if he is ok to come tomorrow for his infusion with this. I returned her call and advised her that patient does not have an appointment tomorrow, it is Monday and she thanked me for letting her know and plans to come in Monday as scheduled

## 2023-03-27 NOTE — Telephone Encounter (Signed)
Final urine culture report back.  Since he was still symptomatic and urinalysis still abnormal send Rx doxycycline 100 mg twice daily x 2 weeks.  Call back for persistent or recurrent symptoms once antibiotic course is completed

## 2023-03-27 NOTE — Telephone Encounter (Signed)
Notified patient as instructed, patient pleased. Medication sent.

## 2023-03-28 ENCOUNTER — Telehealth: Payer: Self-pay | Admitting: Internal Medicine

## 2023-03-28 NOTE — Telephone Encounter (Signed)
Pt wife called and stated that pt is currently on antibiotic for UTI. She requested to moe tx appt to 9/12 or 9/13. But she said that if its ok to get tx while on antibiotics than we can keep appt for 9/9. Please Francee Piccolo

## 2023-03-31 ENCOUNTER — Inpatient Hospital Stay: Payer: Medicare HMO

## 2023-03-31 ENCOUNTER — Other Ambulatory Visit: Payer: Self-pay | Admitting: Internal Medicine

## 2023-03-31 ENCOUNTER — Encounter: Payer: Self-pay | Admitting: Internal Medicine

## 2023-03-31 ENCOUNTER — Inpatient Hospital Stay: Payer: Medicare HMO | Attending: Internal Medicine | Admitting: Internal Medicine

## 2023-03-31 VITALS — BP 135/68 | HR 84 | Temp 97.8°F | Ht 71.0 in | Wt 132.4 lb

## 2023-03-31 DIAGNOSIS — C61 Malignant neoplasm of prostate: Secondary | ICD-10-CM | POA: Insufficient documentation

## 2023-03-31 DIAGNOSIS — Z5112 Encounter for antineoplastic immunotherapy: Secondary | ICD-10-CM | POA: Diagnosis present

## 2023-03-31 DIAGNOSIS — N390 Urinary tract infection, site not specified: Secondary | ICD-10-CM | POA: Diagnosis not present

## 2023-03-31 DIAGNOSIS — I4940 Unspecified premature depolarization: Secondary | ICD-10-CM

## 2023-03-31 DIAGNOSIS — C9000 Multiple myeloma not having achieved remission: Secondary | ICD-10-CM | POA: Diagnosis not present

## 2023-03-31 DIAGNOSIS — B9620 Unspecified Escherichia coli [E. coli] as the cause of diseases classified elsewhere: Secondary | ICD-10-CM | POA: Diagnosis not present

## 2023-03-31 DIAGNOSIS — C9002 Multiple myeloma in relapse: Secondary | ICD-10-CM | POA: Insufficient documentation

## 2023-03-31 DIAGNOSIS — E782 Mixed hyperlipidemia: Secondary | ICD-10-CM

## 2023-03-31 LAB — CBC WITH DIFFERENTIAL/PLATELET
Abs Immature Granulocytes: 0.01 10*3/uL (ref 0.00–0.07)
Basophils Absolute: 0 10*3/uL (ref 0.0–0.1)
Basophils Relative: 0 %
Eosinophils Absolute: 0.1 10*3/uL (ref 0.0–0.5)
Eosinophils Relative: 2 %
HCT: 37.6 % — ABNORMAL LOW (ref 39.0–52.0)
Hemoglobin: 12.9 g/dL — ABNORMAL LOW (ref 13.0–17.0)
Immature Granulocytes: 0 %
Lymphocytes Relative: 36 %
Lymphs Abs: 1.5 10*3/uL (ref 0.7–4.0)
MCH: 33.6 pg (ref 26.0–34.0)
MCHC: 34.3 g/dL (ref 30.0–36.0)
MCV: 97.9 fL (ref 80.0–100.0)
Monocytes Absolute: 0.4 10*3/uL (ref 0.1–1.0)
Monocytes Relative: 9 %
Neutro Abs: 2.2 10*3/uL (ref 1.7–7.7)
Neutrophils Relative %: 53 %
Platelets: 161 10*3/uL (ref 150–400)
RBC: 3.84 MIL/uL — ABNORMAL LOW (ref 4.22–5.81)
RDW: 14 % (ref 11.5–15.5)
WBC: 4.1 10*3/uL (ref 4.0–10.5)
nRBC: 0 % (ref 0.0–0.2)

## 2023-03-31 LAB — COMPREHENSIVE METABOLIC PANEL
ALT: 22 U/L (ref 0–44)
AST: 24 U/L (ref 15–41)
Albumin: 3.8 g/dL (ref 3.5–5.0)
Alkaline Phosphatase: 84 U/L (ref 38–126)
Anion gap: 4 — ABNORMAL LOW (ref 5–15)
BUN: 14 mg/dL (ref 8–23)
CO2: 26 mmol/L (ref 22–32)
Calcium: 8.8 mg/dL — ABNORMAL LOW (ref 8.9–10.3)
Chloride: 99 mmol/L (ref 98–111)
Creatinine, Ser: 0.89 mg/dL (ref 0.61–1.24)
GFR, Estimated: >60 mL/min (ref >60)
Glucose, Bld: 97 mg/dL (ref 70–99)
Potassium: 3.9 mmol/L (ref 3.5–5.1)
Sodium: 129 mmol/L — ABNORMAL LOW (ref 135–145)
Total Bilirubin: 0.6 mg/dL (ref 0.3–1.2)
Total Protein: 6.1 g/dL — ABNORMAL LOW (ref 6.5–8.1)

## 2023-03-31 MED ORDER — ACETAMINOPHEN 325 MG PO TABS
650.0000 mg | ORAL_TABLET | Freq: Once | ORAL | Status: AC
  Administered 2023-03-31: 650 mg via ORAL
  Filled 2023-03-31: qty 2

## 2023-03-31 MED ORDER — DARATUMUMAB-HYALURONIDASE-FIHJ 1800-30000 MG-UT/15ML ~~LOC~~ SOLN
1800.0000 mg | Freq: Once | SUBCUTANEOUS | Status: AC
  Administered 2023-03-31: 1800 mg via SUBCUTANEOUS
  Filled 2023-03-31: qty 15

## 2023-03-31 MED ORDER — DIPHENHYDRAMINE HCL 25 MG PO CAPS
50.0000 mg | ORAL_CAPSULE | Freq: Once | ORAL | Status: AC
  Administered 2023-03-31: 50 mg via ORAL
  Filled 2023-03-31: qty 2

## 2023-03-31 MED ORDER — ZOLPIDEM TARTRATE 5 MG PO TABS: 5.0000 mg | ORAL_TABLET | Freq: Every evening | ORAL | 0 refills | Status: DC | PRN

## 2023-03-31 NOTE — Assessment & Plan Note (Addendum)
# Multiple myeloma, RISS Stage II- -MRI thoracic and lumbar spine with and without contrast was reviewed. Showed unchanged subacute compression fracture at T12.  Focal enhancing marrow replacing lesions in T6, T8, T5, T2 all suspicious for metastatic disease.  L1 lesion measuring 16 mm.  SPEP/IFE showed biclonal IgA kappa paraprotein with M spike 2.1 g/dL and second spike at 0.4 g/dL.  Kappa 453, lambda 6.8 with a ratio of 66.7.  Iron panel and B12 are normal.  PSA 3.44.  CBC showed mild anemia 13.4, chronic.  On CMP normal creatinine and calcium levels.  Elevated total protein 8.8.  LDH normal.  Beta-2 microglobulin 3.7.  Albumin 3.6.  24-hour urine/UPEP showed 1.1 g protein with no M spike.   -BMBx Hypercellular marrow with 60% plasma cells.  IHC positive for CD138/ mum 1.  Kappa restricted consistent with multiple myeloma. L1 vertebral body biopsy showed numerous plasma cells. Cytogenetics - del (13q) and gain 1.     -PET/CT showed activity in L1 and left eighth rib concerning for myeloma.   -Started on Dara-RD on 12/26/2022.  Completed cycle 1 of daratumumab -> then hospitalized for urinary retention, constipation and back pain -> inpatient hospitalist team discussed with patient and transition him to hospice -> patient revoked hospice on 01/31/2023 since he was feeling better.  Multiple treatment interruptions due to UTIs and hospitalization for stroke.   # Proceed with Dara-SQ today. HOLD Revlimid 2.5 mg 3-week on 1 week off on hold.  Will continue to hold for at least 1 month from his stroke Gabriel Rung is unlikely to be related to Revlimid since he was not taking it at the time of the event.]- continue to [facility] for now.    -Showing response to treatment.  Kappa chains decreasing and M spike decreased from 2.1 to 0.2. will recheck MM panel; K/l lights at next visit.    # Recurrent UTIs: -Follows with Dr. Lonna Cobb. S/p Doxy  with Urology- symptoms improved.    # Stroke: - Admitted 03/07/2023 with acute  onset aphasia and right hemianopia. s/p tPA with complete resolution of symptoms.  MRI brain negative.  Echo unremarkable.  On aspirin and Plavix for 21 days then aspirin alone.  On Lipitor 40 mg daily.  Scheduled to follow-up with Dr. Juliann Pares cardiology to discuss about ILR implantation. -I will hold Revlimid for at least 1 month post stroke.   # Intractable hiccups: -Secondary to dexamethasone; -Tolerates split dose dexamethasone 8 mg on day 1 and 2- HOLD IV dex.    # Vertebral compression fractures: - Advised to obtain dental clearance prior to starting Zometa   # T12 pathological fracture/# Cancer-related pain: - s/p kyphoplasty by Dr. Elijio Miles on 12/06/22.  No improvement in pain; -Completed palliative RT on 01/20/2023. -Reports significant improvement in pain.  Completely off opioids now- stable.    # Opioid-induced constipation -Manageable with senna, MiraLAX, lactulose, Movantik as needed.   # History of prostate cancer: - follows with Dr. Evelene Croon, urology for elevated PSA.  MRI prostate showed 1 mm category 5 lesion of anterior transition zone.  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 s/p HIFU of the prostate on 02/07/2022.  PSA level from 10/08/2022 was 3.34.  -PSA from 11/21/2022 3.44.   # Poor appetite/  Weight loss: -Could be from cancer and uncontrolled pain.  Pain management as above. Nutrition following. -He is taking Marinol 2.5 mg once daily.    # Anxiety/ insomnia- Ambien 5 mg nightly;  will prescribe. Discussed re: falls.  Consider Trazadone at bedtime next visit.   # DISPOSITION:  # chemo today # chemo as planned per IS-  # labs- cbc/cmp; MM panel; K/L light chain-Dr.B

## 2023-03-31 NOTE — Progress Notes (Signed)
Asking if being on doxycycline for uti will put a hold on tx today? Has 2 days left of rx. Symptoms have cleared.  Stopped lipitor due to aches, stomach ache and trouble sleeping.  Can he have lab work to check his cancer numbers?  Dexamethasone is giving him hiccups that last hours.  Has trouble sleeping, nothing is helping. Asking if you Could you give him ambien? Needs something to help get him to sleep.

## 2023-03-31 NOTE — Progress Notes (Signed)
[name] CONSULT NOTE  Patient Care Team: [name], MD as PCP - General (Internal Medicine) [name], MD as Consulting Physician (Oncology)  CHIEF COMPLAINTS/PURPOSE OF CONSULTATION:  Multiple myeloma  HISTORY OF PRESENTING ILLNESS: Patient ambulating-with assistance/rolling walker. Accompanied by wife.   Melvin Gilmore 82 y.o.  male pleasant patient with with Multiple myeloma on chemo is here for a follow up.   Patient recently s/p doxycycline for uti . Has 2 days left of rx. Symptoms have cleared.   Stopped lipitor due to aches, stomach ache and trouble sleeping.    Dexamethasone is giving him hiccups that last hours; improved.  Has trouble sleeping, nothing is helping.   Review of Systems  Constitutional:  Negative for chills, diaphoresis, fever, malaise/fatigue and weight loss.  HENT:  Negative for nosebleeds and sore throat.   Eyes:  Negative for double vision.  Respiratory:  Negative for cough, hemoptysis, sputum production, shortness of breath and wheezing.   Cardiovascular:  Negative for chest pain, palpitations, orthopnea and leg swelling.  Gastrointestinal:  Negative for abdominal pain, blood in stool, constipation, diarrhea, heartburn, melena, nausea and vomiting.  Genitourinary:  Negative for dysuria, frequency and urgency.  Musculoskeletal:  Negative for back pain and joint pain.  Skin: Negative.  Negative for itching and rash.  Neurological:  Negative for dizziness, tingling, focal weakness, weakness and headaches.  Endo/Heme/Allergies:  Does not bruise/bleed easily.  Psychiatric/Behavioral:  Negative for depression. The patient is not nervous/anxious and does not have insomnia.     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   Nausea vomiting and diarrhea 01/26/2023   Prostate cancer (HCC) 2023   Seasonal allergies    Skin cancer    a.) ears   Wears partial dentures    lower    SURGICAL HISTORY: Past Surgical History:  Procedure Laterality Date   APPENDECTOMY     CATARACT EXTRACTION W/PHACO Right 01/15/2016   Procedure: CATARACT EXTRACTION PHACO AND INTRAOCULAR LENS PLACEMENT (IOC);  Surgeon: Sherald Hess, MD;  Location: [facility];  Service: Ophthalmology;  Laterality: Right;  RIGHT CALL CELL PHONE WITH TIME   CHOLECYSTECTOMY     COLONOSCOPY     COLONOSCOPY WITH ESOPHAGOGASTRODUODENOSCOPY (EGD)     ESOPHAGOGASTRODUODENOSCOPY  07/2021   ESOPHAGOGASTRODUODENOSCOPY (EGD) WITH PROPOFOL N/A 09/23/2017   Procedure: ESOPHAGOGASTRODUODENOSCOPY (EGD) WITH PROPOFOL;  Surgeon: Toledo, Boykin Nearing, MD;  Location: [facility];  Service: Gastroenterology;  Laterality: N/A;   HIGH INTENSITY FOCUSED ULTRASOUND (HIFU) OF THE PROSTATE N/A 02/07/2022   Procedure: HIGH INTENSITY FOCUSED ULTRASOUND (HIFU) OF THE PROSTATE;  Surgeon: Orson Ape, MD;  Location: [facility];  Service: Urology;  Laterality: N/A;   HYDROCELE EXCISION / REPAIR     IR KYPHO THORACIC WITH BONE BIOPSY  12/06/2022   IR RADIOLOGIST EVAL & MGMT  11/21/2022   LASIX RT EYE     PROSTATE BIOPSY N/A 01/03/2022   Procedure: PROSTATE  BIOPSY  URONAV;  Surgeon: Orson Ape, MD;  Location: [facility];  Service: Urology;  Laterality: N/A;   ROTATOR CUFF REPAIR Left 2001    SOCIAL HISTORY: Social History   Socioeconomic History   Marital status: Married    Spouse name: Not on file   Number of children: Not on file   Years of education: Not on file   Highest education level: Not on file   Occupational History   Not on file  Tobacco Use   Smoking status: Never   Smokeless tobacco: Never  Vaping Use   Vaping status: Never Used  Substance and Sexual Activity   Alcohol use: Yes    Alcohol/week: 7.0 standard drinks of alcohol    Types: 7 Glasses of wine per week    Comment: wine occ   Drug use: No   Sexual activity: Not on file  Other Topics Concern   Not on file  Social History Narrative   Not on file   Social Determinants of Health   Financial Resource Strain: Low Risk  (11/21/2022)   Overall Financial Resource Strain (CARDIA)    Difficulty of Paying Living Expenses: Not hard at all  Food Insecurity: Patient Unable To Answer (03/07/2023)   Hunger Vital Sign    Worried About Running Out of Food in the Last Year: Patient unable to answer    Ran Out of Food in the Last Year: Patient unable to answer  Transportation Needs: Patient Unable To Answer (03/07/2023)   PRAPARE - Transportation    Lack of Transportation (Medical): Patient unable to answer    Lack of Transportation (Non-Medical): Patient unable to answer  Physical Activity: Not on file  Stress: Not on file  Social Connections: Not on file  Intimate Partner Violence: Patient Unable To Answer (03/07/2023)   Humiliation, Afraid, Rape, and Kick questionnaire    Fear of Current or Ex-Partner: Patient unable to answer    Emotionally Abused: Patient unable to answer    Physically Abused: Patient unable to answer    Sexually Abused: Patient unable to answer    FAMILY HISTORY: Family History  Problem Relation Age of Onset   Pneumonia Father     ALLERGIES:  is allergic to ciprofloxacin, amoxicillin-pot clavulanate, and meloxicam.  MEDICATIONS:  Current Outpatient Medications  Medication Sig Dispense Refill   doxycycline (VIBRA-TABS) 100 MG tablet Take 1 tablet (100 mg total) by mouth 2 (two) times daily. 14 tablet 0   zolpidem (AMBIEN) 5 MG tablet Take 1 tablet (5 mg total) by mouth at bedtime as needed for  sleep. 30 tablet 0   acetaminophen (TYLENOL) 500 MG tablet Take 500 mg by mouth every 6 (six) hours as needed for mild pain, moderate pain, fever or headache.     acyclovir (ZOVIRAX) 400 MG tablet Take 400 mg by mouth 2 (two) times daily.     ALPRAZolam (XANAX) 0.25 MG tablet Take 1 tablet (0.25 mg total) by mouth at bedtime as needed for anxiety. (Patient not taking: Reported on 03/31/2023) 30 tablet 0   atorvastatin (LIPITOR) 40 MG tablet Take 1 tablet (40 mg total) by mouth daily. (Patient not taking: Reported on 03/31/2023) 30 tablet 2   levothyroxine (SYNTHROID, LEVOTHROID) 50 MCG tablet Take 50 mcg by mouth daily before breakfast.     pantoprazole (PROTONIX) 40 MG tablet Take 40 mg by mouth daily as needed.     Probiotic Product (PROBIOTIC ADVANCED PO) Take 1 tablet by mouth at bedtime.  senna (SENOKOT) 8.6 MG tablet Take 2 tablets (17.2 mg total) by mouth 2 (two) times daily. May crush, mix with water and give sublingually if needed. 28 tablet 0   No current facility-administered medications for this visit.    PHYSICAL EXAMINATION:   Vitals:   03/31/23 0921  BP: 135/68  Pulse: 84  Temp: 97.8 F (36.6 C)  SpO2: 99%   Filed Weights   03/31/23 0921  Weight: 132 lb 6.4 oz (60.1 kg)    Physical Exam Vitals and nursing note reviewed.  HENT:     Head: Normocephalic and atraumatic.     Mouth/Throat:     Pharynx: Oropharynx is clear.  Eyes:     Extraocular Movements: Extraocular movements intact.     Pupils: Pupils are equal, round, and reactive to light.  Cardiovascular:     Rate and Rhythm: Normal rate and regular rhythm.  Pulmonary:     Comments: Decreased breath sounds bilaterally.  Abdominal:     Palpations: Abdomen is soft.  Musculoskeletal:        General: Normal range of motion.     Cervical back: Normal range of motion.  Skin:    General: Skin is warm.  Neurological:     General: No focal deficit present.     Mental Status: He is alert and oriented to person,  place, and time.  Psychiatric:        Behavior: Behavior normal.        Judgment: Judgment normal.     LABORATORY DATA:  I have reviewed the data as listed Lab Results  Component Value Date   WBC 4.1 03/31/2023   HGB 12.9 (L) 03/31/2023   HCT 37.6 (L) 03/31/2023   MCV 97.9 03/31/2023   PLT 161 03/31/2023   Recent Labs    03/07/23 1023 03/08/23 0520 03/21/23 0935 03/31/23 0927  NA 126* 131* 128* 129*  K 3.4* 4.6 4.5 3.9  CL 97* 104 95* 99  CO2 17* 21* 25 26  GLUCOSE 105* 88 95 97  BUN 10 9 16 14   CREATININE 0.83 0.77 1.00 0.89  CALCIUM 8.5* 8.9 9.3 8.8*  GFRNONAA >60 >60 >60 >60  PROT 6.1*  --  6.8 6.1*  ALBUMIN 3.6 3.3* 4.4 3.8  AST 29  --  30 24  ALT 21  --  28 22  ALKPHOS 78  --  100 84  BILITOT 1.2  --  0.6 0.6    RADIOGRAPHIC STUDIES: I have personally reviewed the radiological images as listed and agreed with the findings in the report. ECHOCARDIOGRAM COMPLETE  Result Date: 03/08/2023    ECHOCARDIOGRAM REPORT   Patient Name:   ANDREAUS TEZAK Date of Exam: 03/08/2023 Medical Rec #:  [id number]        Height:       71.0 in Accession #:    [id number]       Weight:       131.0 lb Date of Birth:  03/29/1941        BSA:          1.762 m Patient Age:    81 years         BP:           126/73 mmHg Patient Gender: M                HR:           61 bpm. Exam Location:  [facility] Procedure: 2D Echo, Cardiac Doppler, Color  Doppler and Strain Analysis Indications:     Stroke 434.91 / I163.9  History:         Patient has no prior history of Echocardiogram examinations.                  Signs/Symptoms:Chest Pain; Risk Factors:Hypertension.  Sonographer:     Neysa Bonito Roar Referring Phys:  [phone] MCNEILL P KIRKPATRICK Diagnosing Phys: Armanda Magic MD  Sonographer Comments: Global longitudinal strain was attempted. IMPRESSIONS  1. Left ventricular ejection fraction, by estimation, is 60 to 65%. The left ventricle has normal function. The left ventricle has no regional wall motion abnormalities.  Left ventricular diastolic parameters are consistent with Grade I diastolic dysfunction (impaired relaxation). The average left ventricular global longitudinal strain is -16.7 %. The global longitudinal strain is normal.  2. Right ventricular systolic function is normal. The right ventricular size is normal.  3. The mitral valve is normal in structure. Mild mitral valve regurgitation. No evidence of mitral stenosis.  4. The aortic valve is tricuspid. Aortic valve regurgitation is trivial. Aortic valve sclerosis/calcification is present, without any evidence of aortic stenosis. Aortic valve area, by VTI measures 2.80 cm. Aortic valve mean gradient measures 2.0 mmHg.  Aortic valve Vmax measures 0.94 m/s.  5. Aortic dilatation noted. There is mild dilatation of the aortic root, measuring 39 mm. FINDINGS  Left Ventricle: Left ventricular ejection fraction, by estimation, is 60 to 65%. The left ventricle has normal function. The left ventricle has no regional wall motion abnormalities. The average left ventricular global longitudinal strain is -16.7 %. The global longitudinal strain is normal. The left ventricular internal cavity size was normal in size. There is no left ventricular hypertrophy. Left ventricular diastolic parameters are consistent with Grade I diastolic dysfunction (impaired relaxation). Normal left ventricular filling pressure. Right Ventricle: The right ventricular size is normal. No increase in right ventricular wall thickness. Right ventricular systolic function is normal. Left Atrium: Left atrial size was normal in size. Right Atrium: Right atrial size was normal in size. Pericardium: There is no evidence of pericardial effusion. Mitral Valve: The mitral valve is normal in structure. Mild mitral annular calcification. Mild mitral valve regurgitation. No evidence of mitral valve stenosis. Tricuspid Valve: The tricuspid valve is normal in structure. Tricuspid valve regurgitation is trivial. No  evidence of tricuspid stenosis. Aortic Valve: The aortic valve is tricuspid. Aortic valve regurgitation is trivial. Aortic valve sclerosis/calcification is present, without any evidence of aortic stenosis. Aortic valve mean gradient measures 2.0 mmHg. Aortic valve peak gradient measures 3.5 mmHg. Aortic valve area, by VTI measures 2.80 cm. Pulmonic Valve: The pulmonic valve was normal in structure. Pulmonic valve regurgitation is mild. No evidence of pulmonic stenosis. Aorta: Aortic dilatation noted. There is mild dilatation of the aortic root, measuring 39 mm. Venous: The inferior vena cava was not well visualized. IAS/Shunts: No atrial level shunt detected by color flow Doppler.  LEFT VENTRICLE PLAX 2D LVIDd:         4.30 cm   Diastology LVIDs:         3.10 cm   LV e' medial:    8.70 cm/s LV PW:         0.80 cm   LV E/e' medial:  8.0 LV IVS:        0.90 cm   LV e' lateral:   7.51 cm/s LVOT diam:     2.00 cm   LV E/e' lateral: 9.3 LV SV:  47 LV SV Index:   27        2D Longitudinal Strain LVOT Area:     3.14 cm  2D Strain GLS (A2C):   -17.0 %                          2D Strain GLS (A3C):   -17.4 %                          2D Strain GLS (A4C):   -15.7 %                          2D Strain GLS Avg:     -16.7 % RIGHT VENTRICLE RV Basal diam:  2.80 cm RV Mid diam:    2.70 cm RV S prime:     13.70 cm/s TAPSE (M-mode): 2.1 cm LEFT ATRIUM             Index        RIGHT ATRIUM           Index LA diam:        3.00 cm 1.70 cm/m   RA Area:     14.10 cm LA Vol (A2C):   39.0 ml 22.14 ml/m  RA Volume:   30.80 ml  17.48 ml/m LA Vol (A4C):   34.2 ml 19.42 ml/m LA Biplane Vol: 37.1 ml 21.06 ml/m  AORTIC VALVE                    PULMONIC VALVE AV Area (Vmax):    2.66 cm     PV Vmax:          0.90 m/s AV Area (Vmean):   2.52 cm     PV Peak grad:     3.2 mmHg AV Area (VTI):     2.80 cm     PR End Diast Vel: 4.08 msec AV Vmax:           94.20 cm/s   RVOT Peak grad:   2 mmHg AV Vmean:          61.500 cm/s AV VTI:             0.168 m AV Peak Grad:      3.5 mmHg AV Mean Grad:      2.0 mmHg LVOT Vmax:         79.80 cm/s LVOT Vmean:        49.300 cm/s LVOT VTI:          0.150 m LVOT/AV VTI ratio: 0.89  AORTA Ao Root diam: 3.10 cm Ao Asc diam:  3.20 cm MITRAL VALVE               TRICUSPID VALVE MV Area (PHT): 3.10 cm    TR Peak grad:   19.9 mmHg MV Decel Time: 245 msec    TR Vmax:        223.00 cm/s MV E velocity: 69.70 cm/s MV A velocity: 95.90 cm/s  SHUNTS MV E/A ratio:  0.73        Systemic VTI:  0.15 m MV A Prime:    10.1 cm/s   Systemic Diam: 2.00 cm Armanda Magic MD Electronically signed by Armanda Magic MD Signature Date/Time: 03/08/2023/5:43:04 PM    Final    MR BRAIN WO CONTRAST  Result Date: 03/08/2023 CLINICAL DATA:  Stroke, follow-up. Acute onset  aphasia and right hemianopia with marked improvement following IV tenecteplase. EXAM: MRI HEAD WITHOUT CONTRAST TECHNIQUE: Multiplanar, multiecho pulse sequences of the brain and surrounding structures were obtained without intravenous contrast. COMPARISON:  Head CT and CTA head/neck 03/07/2023. MRI brain 06/24/2020. FINDINGS: Brain: No acute infarct or hemorrhage. Stable mild chronic small-vessel disease. No mass or midline shift. No hydrocephalus or extra-axial collection. No abnormal susceptibility. Vascular: Normal flow voids. Skull and upper cervical spine: Normal marrow signal. Sinuses/Orbits: No acute findings. Other: None. IMPRESSION: No acute intracranial process. Electronically Signed   By: Orvan Falconer M.D.   On: 03/08/2023 11:19   CT HEAD WO CONTRAST ( )  Result Date: 03/07/2023 CLINICAL DATA:  Stroke, follow-up. Dysphagia beginning at 1 p.m. today. Patient previously was treated with TIA head a EXAM: CT HEAD WITHOUT CONTRAST TECHNIQUE: Contiguous axial images were obtained from the base of the skull through the vertex without intravenous contrast. RADIATION DOSE REDUCTION: This exam was performed according to the departmental dose-optimization program which  includes automated exposure control, adjustment of the mA and/or kV according to patient size and/or use of iterative reconstruction technique. COMPARISON:  CT head without contrast 03/07/2023. CT angio head and neck 03/07/2023. FINDINGS: Brain: No acute infarct, hemorrhage, or mass lesion is present. Mild periventricular white matter hypoattenuation is stable. Deep brain nuclei are within normal limits. No significant extraaxial fluid collection is present. The ventricles are proportionate to the degree of atrophy. The brainstem and cerebellum are within normal limits. Midline structures are within normal limits. Vascular: Choose scratched at atherosclerotic calcifications are present within the cavernous internal carotid arteries bilaterally. No hyperdense vessel is present. Skull: Calvarium is intact. No focal lytic or blastic lesions are present. No significant extracranial soft tissue lesion is present. Sinuses/Orbits: The paranasal sinuses and mastoid air cells are clear. A right lens replacement is present. Globes and orbits are otherwise within normal limits. IMPRESSION: 1. No acute intracranial abnormality or significant interval change. 2. Stable mild periventricular white matter disease. 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 Electronically Signed   By: Marin Roberts M.D.   On: 03/07/2023 16:20   EEG adult  Result Date: 03/07/2023 Rejeana Brock, MD     03/07/2023  3:49 PM History: 82 year old male who presented with acute aphasia Sedation: None Technique: This EEG was acquired with electrodes placed according to the International 10-20 electrode system (including Fp1, Fp2, F3, F4, C3, C4, P3, P4, O1, O2, T3, T4, T5, T6, A1, A2, Fz, Cz, Pz). The following electrodes were missing or displaced: none. Patient State: Awake and drowsy Background: There is a posterior dominant rhythm of 8 to 9 Hz.  In addition, there is generalized frontally predominant irregular  slow activity throughout the recording.  This pattern was present throughout the recording. Photic stimulation: Physiologic driving is not performed EEG Abnormalities: 1) generalized irregular slow activity Clinical Interpretation: This EEG is consistent with a mild generalized nonspecific cerebral dysfunction (encephalopathy).   There was no seizure or seizure predisposition recorded on this study. Please note that lack of epileptiform activity on EEG does not preclude the possibility of epilepsy. Ritta Slot, MD [facility] [phone] If 7pm- 7am, please page neurology on call as listed in AMION.  CT HEAD CODE STROKE WO CONTRAST  Result Date: 03/07/2023 CLINICAL DATA:  Code stroke. Disorientation, right hemianopsia, and aphasia EXAM: CT ANGIOGRAPHY HEAD AND NECK WITH AND WITHOUT CONTRAST TECHNIQUE: Multidetector CT imaging of the head and neck was performed using the standard protocol during bolus  administration of intravenous contrast. Multiplanar CT image reconstructions and MIPs were obtained to evaluate the vascular anatomy. Carotid stenosis measurements (when applicable) are obtained utilizing NASCET criteria, using the distal internal carotid diameter as the denominator. RADIATION DOSE REDUCTION: This exam was performed according to the departmental dose-optimization program which includes automated exposure control, adjustment of the mA and/or kV according to patient size and/or use of iterative reconstruction technique. CONTRAST:  75mL OMNIPAQUE IOHEXOL 350 MG/ML SOLN COMPARISON:  CT head 08/21/2022 FINDINGS: CT HEAD FINDINGS Brain: There is no acute intracranial hemorrhage, extra-axial fluid collection, or acute infarct. Parenchymal volume is within expected limits for age. The ventricles are normal in size. Gray-white differentiation is preserved The pituitary and suprasellar region are normal. There is no mass lesion. There is no mass effect or midline shift. Vascular: No  hyperdense vessel or unexpected calcification. Skull: Normal. Negative for fracture or focal lesion. Sinuses/Orbits: The imaged paranasal sinuses are clear. A right lens implant is noted. The globes and orbits are otherwise unremarkable. Other: The mastoid air cells and middle ear cavities are clear. ASPECTS [facility] Stroke Program Early CT Score) - Ganglionic level infarction (caudate, lentiform nuclei, internal capsule, insula, M1-M3 cortex): 7 - Supraganglionic infarction (M4-M6 cortex): 3 Total score (0-10 with 10 being normal): 10 CTA NECK FINDINGS Aortic arch: There is a minimal calcified plaque in the imaged aortic arch. The origins of the major branch vessels are patent. The subclavian arteries are patent to the level imaged. Right carotid system: The right common, internal, and external carotid arteries are patent, with minimal plaque at the bifurcation but no hemodynamically significant stenosis or occlusion. There is no evidence of dissection or aneurysm. Left carotid system: The left common, internal, and external carotid arteries are patent, with minimal plaque at the bifurcation but no hemodynamically significant stenosis or occlusion. There is no evidence of dissection or aneurysm. Vertebral arteries: The vertebral arteries are patent, without hemodynamically significant stenosis or occlusion there is no evidence of dissection or aneurysm. Skeleton: There is mild compression deformity of the superior T3 and T4 endplates, similar to the prior thoracic spine MRI. There are diffuse small lucencies throughout the imaged spine consistent with myeloma lesions. There is no definite soft tissue component within the spinal canal. Other neck: The soft tissues of the neck are unremarkable. Upper chest: There is scarring in both lung apices. Review of the MIP images confirms the above findings CTA HEAD FINDINGS Anterior circulation: There is mild calcified plaque in the intracranial ICAs without significant  stenosis or occlusion. The bilateral MCAs and ACAS are patent, without proximal stenosis or occlusion. There is a common A2 trunk, a normal variant. There is no aneurysm or AVM. Posterior circulation: There is diminutive enhancement of the right V4 segment, favored predominantly congenital with possible superimposed atherosclerosis. The left V4 segment is patent. The basilar artery is patent. The major cerebellar arteries are patent. The bilateral PCAs are patent, predominantly supplied by posterior communicating arteries bilaterally (fetal origins). There is no proximal stenosis or occlusion. There is no aneurysm or AVM. Venous sinuses: Suboptimally evaluated due to bolus timing. Anatomic variants: As above. Review of the MIP images confirms the above findings IMPRESSION: 1. No acute intracranial pathology. 2. Patent vasculature of the head and neck, with no hemodynamically significant stenosis, occlusion, or dissection. 3. Diffuse lucencies throughout the imaged spine consistent with myeloma lesions. Findings of the initial noncontrast head CT communicated to Dr Amada Jupiter at 10:00 am. The CTA results were discussed at approximately 10:15 am.  Electronically Signed   By: Lesia Hausen M.D.   On: 03/07/2023 10:28   CT ANGIO HEAD NECK W WO CM (CODE STROKE)  Result Date: 03/07/2023 CLINICAL DATA:  Code stroke. Disorientation, right hemianopsia, and aphasia EXAM: CT ANGIOGRAPHY HEAD AND NECK WITH AND WITHOUT CONTRAST TECHNIQUE: Multidetector CT imaging of the head and neck was performed using the standard protocol during bolus administration of intravenous contrast. Multiplanar CT image reconstructions and MIPs were obtained to evaluate the vascular anatomy. Carotid stenosis measurements (when applicable) are obtained utilizing NASCET criteria, using the distal internal carotid diameter as the denominator. RADIATION DOSE REDUCTION: This exam was performed according to the departmental dose-optimization program  which includes automated exposure control, adjustment of the mA and/or kV according to patient size and/or use of iterative reconstruction technique. CONTRAST:  75mL OMNIPAQUE IOHEXOL 350 MG/ML SOLN COMPARISON:  CT head 08/21/2022 FINDINGS: CT HEAD FINDINGS Brain: There is no acute intracranial hemorrhage, extra-axial fluid collection, or acute infarct. Parenchymal volume is within expected limits for age. The ventricles are normal in size. Gray-white differentiation is preserved The pituitary and suprasellar region are normal. There is no mass lesion. There is no mass effect or midline shift. Vascular: No hyperdense vessel or unexpected calcification. Skull: Normal. Negative for fracture or focal lesion. Sinuses/Orbits: The imaged paranasal sinuses are clear. A right lens implant is noted. The globes and orbits are otherwise unremarkable. Other: The mastoid air cells and middle ear cavities are clear. ASPECTS [facility] Stroke Program Early CT Score) - Ganglionic level infarction (caudate, lentiform nuclei, internal capsule, insula, M1-M3 cortex): 7 - Supraganglionic infarction (M4-M6 cortex): 3 Total score (0-10 with 10 being normal): 10 CTA NECK FINDINGS Aortic arch: There is a minimal calcified plaque in the imaged aortic arch. The origins of the major branch vessels are patent. The subclavian arteries are patent to the level imaged. Right carotid system: The right common, internal, and external carotid arteries are patent, with minimal plaque at the bifurcation but no hemodynamically significant stenosis or occlusion. There is no evidence of dissection or aneurysm. Left carotid system: The left common, internal, and external carotid arteries are patent, with minimal plaque at the bifurcation but no hemodynamically significant stenosis or occlusion. There is no evidence of dissection or aneurysm. Vertebral arteries: The vertebral arteries are patent, without hemodynamically significant stenosis or occlusion there  is no evidence of dissection or aneurysm. Skeleton: There is mild compression deformity of the superior T3 and T4 endplates, similar to the prior thoracic spine MRI. There are diffuse small lucencies throughout the imaged spine consistent with myeloma lesions. There is no definite soft tissue component within the spinal canal. Other neck: The soft tissues of the neck are unremarkable. Upper chest: There is scarring in both lung apices. Review of the MIP images confirms the above findings CTA HEAD FINDINGS Anterior circulation: There is mild calcified plaque in the intracranial ICAs without significant stenosis or occlusion. The bilateral MCAs and ACAS are patent, without proximal stenosis or occlusion. There is a common A2 trunk, a normal variant. There is no aneurysm or AVM. Posterior circulation: There is diminutive enhancement of the right V4 segment, favored predominantly congenital with possible superimposed atherosclerosis. The left V4 segment is patent. The basilar artery is patent. The major cerebellar arteries are patent. The bilateral PCAs are patent, predominantly supplied by posterior communicating arteries bilaterally (fetal origins). There is no proximal stenosis or occlusion. There is no aneurysm or AVM. Venous sinuses: Suboptimally evaluated due to bolus timing. Anatomic variants: As  above. Review of the MIP images confirms the above findings IMPRESSION: 1. No acute intracranial pathology. 2. Patent vasculature of the head and neck, with no hemodynamically significant stenosis, occlusion, or dissection. 3. Diffuse lucencies throughout the imaged spine consistent with myeloma lesions. Findings of the initial noncontrast head CT communicated to Dr Amada Jupiter at 10:00 am. The CTA results were discussed at approximately 10:15 am. Electronically Signed   By: Lesia Hausen M.D.   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 Showed unchanged subacute compression fracture at T12.  Focal enhancing marrow replacing lesions in T6, T8, T5, T2 all suspicious for metastatic disease.  L1 lesion measuring 16 mm.  SPEP/IFE showed biclonal IgA kappa paraprotein with M spike 2.1 g/dL and second spike at 0.4 g/dL.  Kappa 453, lambda 6.8 with a ratio of 66.7.  Iron panel and B12 are normal.  PSA 3.44.  CBC showed mild anemia 13.4, chronic.  On CMP normal creatinine and calcium levels.  Elevated total protein 8.8.  LDH normal.  Beta-2 microglobulin 3.7.  Albumin 3.6.  24-hour urine/UPEP showed 1.1 g protein with no M spike.   -BMBx Hypercellular marrow with 60% plasma cells.  IHC positive for CD138/ mum 1.  Kappa restricted consistent with multiple myeloma. L1 vertebral body biopsy showed numerous plasma cells. Cytogenetics - del (13q) and gain 1.     -PET/CT showed activity in L1 and left eighth rib concerning for myeloma.   -Started on Dara-RD on 12/26/2022.  Completed cycle 1 of daratumumab -> then hospitalized for urinary retention, constipation and back pain -> inpatient hospitalist team discussed with patient and transition him to hospice -> patient revoked hospice on 01/31/2023 since he was feeling better.  Multiple treatment interruptions due to UTIs and hospitalization for stroke.   # Proceed with Dara-SQ today. HOLD Revlimid 2.5 mg 3-week on 1 week off on hold.  Will continue to hold for at least 1 month from his stroke Gabriel Rung is unlikely to be related to Revlimid since he was not taking it at the time of the event.]- continue to [facility] for now.    -Showing response to treatment.  Kappa chains decreasing and M spike decreased from 2.1 to 0.2. will recheck MM panel; K/l lights at next visit.    # Recurrent UTIs: -Follows with Dr. Lonna Cobb. S/p Doxy  with Urology- symptoms improved.    # Stroke: - Admitted 03/07/2023 with acute onset aphasia and right hemianopia. s/p tPA with complete resolution  of symptoms.  MRI brain negative.  Echo unremarkable.  On aspirin and Plavix for 21 days then aspirin alone.  On Lipitor 40 mg daily.  Scheduled to follow-up with Dr. Juliann Pares cardiology to discuss about ILR implantation. -I will hold Revlimid for at least 1 month post stroke.   # Intractable hiccups: -Secondary to dexamethasone; -Tolerates split dose dexamethasone 8 mg on day 1 and 2- HOLD IV dex.    # Vertebral compression fractures: - Advised to obtain dental clearance prior to starting Zometa   # T12 pathological fracture/# Cancer-related pain: - s/p kyphoplasty by Dr. Elijio Miles on 12/06/22.  No improvement in pain; -Completed palliative RT on 01/20/2023. -Reports significant improvement in pain.  Completely off opioids now- stable.    # Opioid-induced constipation -Manageable with senna, MiraLAX, lactulose, Movantik as needed.   # History of prostate cancer: - follows with Dr. Evelene Croon,  urology for elevated PSA.  MRI prostate showed 1 mm category 5 lesion of anterior transition zone.  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 s/p HIFU of the prostate on 02/07/2022.  PSA level from 10/08/2022 was 3.34.  -PSA from 11/21/2022 3.44.   # Poor appetite/  Weight loss: -Could be from cancer and uncontrolled pain.  Pain management as above. Nutrition following. -He is taking Marinol 2.5 mg once daily.    # Anxiety/ insomnia- Ambien 5 mg nightly; will prescribe. Discussed re: falls.  Consider Trazadone at bedtime next visit.   # DISPOSITION:  # chemo today # chemo as planned per IS-  # labs- cbc/cmp; MM panel; K/L light chain-Dr.B      Above plan of care was discussed with patient/family in detail.  My contact information was given to the patient/family.       Earna Coder, MD 03/31/2023 12:27 PM

## 2023-03-31 NOTE — Patient Instructions (Signed)
[facility]  Discharge Instructions: Thank you for choosing [facility] to provide your oncology and hematology care.  If you have a lab appointment with the [facility], please go directly to the [facility] and check in at the registration area.  Wear comfortable clothing and clothing appropriate for easy access to any Portacath or PICC line.   We strive to give you quality time with your provider. You may need to reschedule your appointment if you arrive late (15 or more minutes).  Arriving late affects you and other patients whose appointments are after yours.  Also, if you miss three or more appointments without notifying the office, you may be dismissed from the clinic at the provider's discretion.      For prescription refill requests, have your pharmacy contact our office and allow 72 hours for refills to be completed.    Today you received the following chemotherapy and/or immunotherapy agents DARZALEX      To help prevent nausea and vomiting after your treatment, we encourage you to take your nausea medication as directed.  BELOW ARE SYMPTOMS THAT SHOULD BE REPORTED IMMEDIATELY: *FEVER GREATER THAN 100.4 F (38 C) OR HIGHER *CHILLS OR SWEATING *NAUSEA AND VOMITING THAT IS NOT CONTROLLED WITH YOUR NAUSEA MEDICATION *UNUSUAL SHORTNESS OF BREATH *UNUSUAL BRUISING OR BLEEDING *URINARY PROBLEMS (pain or burning when urinating, or frequent urination) *BOWEL PROBLEMS (unusual diarrhea, constipation, pain near the anus) TENDERNESS IN MOUTH AND THROAT WITH OR WITHOUT PRESENCE OF ULCERS (sore throat, sores in mouth, or a toothache) UNUSUAL RASH, SWELLING OR PAIN  UNUSUAL VAGINAL DISCHARGE OR ITCHING   Items with * indicate a potential emergency and should be followed up as soon as possible or go to the Emergency Department if any problems should occur.  Please show the CHEMOTHERAPY ALERT CARD or IMMUNOTHERAPY ALERT CARD at check-in to  the Emergency Department and triage nurse.  Should you have questions after your visit or need to cancel or reschedule your appointment, please contact [facility]  [phone] and follow the prompts.  Office hours are 8:00 a.m. to 4:30 p.m. Monday - Friday. Please note that voicemails left after 4:00 p.m. may not be returned until the following business day.  We are closed weekends and major holidays. You have access to a nurse at all times for urgent questions. Please call the main number to the clinic [phone] and follow the prompts.  For any non-urgent questions, you may also contact your provider using MyChart. We now offer e-Visits for anyone 84 and older to request care online for non-urgent symptoms. For details visit [email or web address].   Also download the MyChart app! Go to the app store, search "MyChart", open the app, select [facility], and log in with your MyChart username and password.   Daratumumab Injection What is this medication? DARATUMUMAB (dar a toom ue mab) treats multiple myeloma, a type of bone marrow cancer. It works by helping your immune system slow or stop the spread of cancer cells. It is a monoclonal antibody. This medicine may be used for other purposes; ask your health care provider or pharmacist if you have questions. COMMON BRAND NAME(S): DARZALEX What should I tell my care team before I take this medication? They need to know if you have any of these conditions: Hereditary fructose intolerance Infection, such as chickenpox, herpes, hepatitis B Lung or breathing disease, such as asthma, COPD An unusual or allergic reaction to daratumumab, sorbitol,  other medications, foods, dyes, or preservatives Pregnant or trying to get pregnant Breastfeeding How should I use this medication? This medication is injected into a vein. It is given by your care team in a hospital or clinic setting. Talk to your care team about the  use of this medication in children. Special care may be needed. Overdosage: If you think you have taken too much of this medicine contact a poison control center or emergency room at once. NOTE: This medicine is only for you. Do not share this medicine with others. What if I miss a dose? Keep appointments for follow-up doses. It is important not to miss your dose. Call your care team if you are unable to keep an appointment. What may interact with this medication? Interactions have not been studied. This list may not describe all possible interactions. Give your health care provider a list of all the medicines, herbs, non-prescription drugs, or dietary supplements you use. Also tell them if you smoke, drink alcohol, or use illegal drugs. Some items may interact with your medicine. What should I watch for while using this medication? Your condition will be monitored carefully while you are receiving this medication. This medication can cause serious allergic reactions. To reduce your risk, your care team may give you other medication to take before receiving this one. Be sure to follow the directions from your care team. This medication can affect the results of blood tests to match your blood type. These changes can last for up to 6 months after the final dose. Your care team will do blood tests to match your blood type before you start treatment. Tell all of your care team that you are being treated with this medication before receiving a blood transfusion. This medication can affect the results of some tests used to determine treatment response; extra tests may be needed to evaluate response. Talk to your care team if you wish to become pregnant or think you are pregnant. This medication can cause serious birth defects if taken during pregnancy and for 3 months after the last dose. A reliable form of contraception is recommended while taking this medication and for 3 months after the last dose. Talk  to your care team about effective forms of contraception. Do not breast-feed while taking this medication. What side effects may I notice from receiving this medication? Side effects that you should report to your care team as soon as possible: Allergic reactions--skin rash, itching, hives, swelling of the face, lips, tongue, or throat Infection--fever, chills, cough, sore throat, wounds that don't heal, pain or trouble when passing urine, general feeling of discomfort or being unwell Infusion reactions--chest pain, shortness of breath or trouble breathing, feeling faint or lightheaded Unusual bruising or bleeding Side effects that usually do not require medical attention (report to your care team if they continue or are bothersome): Constipation Diarrhea Fatigue Nausea Pain, tingling, or numbness in the hands or feet Swelling of the ankles, hands, or feet This list may not describe all possible side effects. Call your doctor for medical advice about side effects. You may report side effects to FDA at 1-800-FDA-1088. Where should I keep my medication? This medication is given in a hospital or clinic. It will not be stored at home. NOTE: This sheet is a summary. It may not cover all possible information. If you have questions about this medicine, talk to your doctor, pharmacist, or health care provider.  2024 [employer]/Gold Standard (2022-05-16 00:00:00)

## 2023-04-03 ENCOUNTER — Other Ambulatory Visit: Payer: Self-pay | Admitting: *Deleted

## 2023-04-03 ENCOUNTER — Encounter: Payer: Self-pay | Admitting: Internal Medicine

## 2023-04-03 DIAGNOSIS — C9000 Multiple myeloma not having achieved remission: Secondary | ICD-10-CM

## 2023-04-07 ENCOUNTER — Inpatient Hospital Stay: Payer: Medicare HMO | Admitting: Internal Medicine

## 2023-04-07 ENCOUNTER — Inpatient Hospital Stay: Payer: Medicare HMO

## 2023-04-07 VITALS — BP 121/78 | HR 69 | Temp 96.0°F

## 2023-04-07 VITALS — BP 114/68 | HR 85 | Temp 98.2°F | Wt 132.0 lb

## 2023-04-07 DIAGNOSIS — M8458XA Pathological fracture in neoplastic disease, other specified site, initial encounter for fracture: Secondary | ICD-10-CM | POA: Diagnosis not present

## 2023-04-07 DIAGNOSIS — C9002 Multiple myeloma in relapse: Secondary | ICD-10-CM

## 2023-04-07 DIAGNOSIS — C9000 Multiple myeloma not having achieved remission: Secondary | ICD-10-CM

## 2023-04-07 DIAGNOSIS — Z5112 Encounter for antineoplastic immunotherapy: Secondary | ICD-10-CM | POA: Diagnosis not present

## 2023-04-07 LAB — CMP (CANCER CENTER ONLY)
ALT: 22 U/L (ref 0–44)
AST: 25 U/L (ref 15–41)
Albumin: 4 g/dL (ref 3.5–5.0)
Alkaline Phosphatase: 88 U/L (ref 38–126)
Anion gap: 8 (ref 5–15)
BUN: 15 mg/dL (ref 8–23)
CO2: 25 mmol/L (ref 22–32)
Calcium: 9.1 mg/dL (ref 8.9–10.3)
Chloride: 96 mmol/L — ABNORMAL LOW (ref 98–111)
Creatinine: 0.9 mg/dL (ref 0.61–1.24)
GFR, Estimated: >60 mL/min (ref >60)
Glucose, Bld: 74 mg/dL (ref 70–99)
Potassium: 3.8 mmol/L (ref 3.5–5.1)
Sodium: 129 mmol/L — ABNORMAL LOW (ref 135–145)
Total Bilirubin: 0.5 mg/dL (ref 0.3–1.2)
Total Protein: 6.6 g/dL (ref 6.5–8.1)

## 2023-04-07 LAB — CBC WITH DIFFERENTIAL (CANCER CENTER ONLY)
Abs Immature Granulocytes: 0 10*3/uL (ref 0.00–0.07)
Basophils Absolute: 0 10*3/uL (ref 0.0–0.1)
Basophils Relative: 0 %
Eosinophils Absolute: 0 10*3/uL (ref 0.0–0.5)
Eosinophils Relative: 1 %
HCT: 41 % (ref 39.0–52.0)
Hemoglobin: 13.7 g/dL (ref 13.0–17.0)
Immature Granulocytes: 0 %
Lymphocytes Relative: 32 %
Lymphs Abs: 1.2 10*3/uL (ref 0.7–4.0)
MCH: 33.1 pg (ref 26.0–34.0)
MCHC: 33.4 g/dL (ref 30.0–36.0)
MCV: 99 fL (ref 80.0–100.0)
Monocytes Absolute: 0.3 10*3/uL (ref 0.1–1.0)
Monocytes Relative: 8 %
Neutro Abs: 2.3 10*3/uL (ref 1.7–7.7)
Neutrophils Relative %: 59 %
Platelet Count: 155 10*3/uL (ref 150–400)
RBC: 4.14 MIL/uL — ABNORMAL LOW (ref 4.22–5.81)
RDW: 14 % (ref 11.5–15.5)
WBC Count: 3.8 10*3/uL — ABNORMAL LOW (ref 4.0–10.5)
nRBC: 0 % (ref 0.0–0.2)

## 2023-04-07 MED ORDER — ACETAMINOPHEN 325 MG PO TABS
650.0000 mg | ORAL_TABLET | Freq: Once | ORAL | Status: AC
  Administered 2023-04-07: 650 mg via ORAL
  Filled 2023-04-07: qty 2

## 2023-04-07 MED ORDER — DARATUMUMAB-HYALURONIDASE-FIHJ 1800-30000 MG-UT/15ML ~~LOC~~ SOLN
1800.0000 mg | Freq: Once | SUBCUTANEOUS | Status: AC
  Administered 2023-04-07: 1800 mg via SUBCUTANEOUS
  Filled 2023-04-07: qty 15

## 2023-04-07 MED ORDER — DIPHENHYDRAMINE HCL 25 MG PO CAPS
50.0000 mg | ORAL_CAPSULE | Freq: Once | ORAL | Status: AC
  Administered 2023-04-07: 50 mg via ORAL
  Filled 2023-04-07: qty 2

## 2023-04-07 NOTE — Progress Notes (Signed)
[facility] CONSULT NOTE  Patient Care Team: [name], MD as PCP - General (Internal Medicine) [name], MD as Consulting Physician (Oncology)  CANCER STAGING   Cancer Staging  Multiple myeloma in relapse [facility]) Staging form: Plasma Cell Myeloma and Plasma Cell Disorders, AJCC 8th Edition - Clinical stage from 12/05/2022: RISS Stage II (Beta-2-microglobulin (mg/L): 3.7, Albumin (g/dL): 3.6, ISS: Stage II, High-risk cytogenetics: Absent, LDH: Normal) - Signed by [name], MD on 12/26/2022 Stage prefix: Initial diagnosis Beta 2 microglobulin range (mg/L): 3.5 to 5.49 Albumin range (g/dL): Greater than or equal to 3.5 Cytogenetics: 1q addition, Other mutation   ASSESSMENT & PLAN:  Melvin Gilmore 82 y.o. male with pmh of hypertension, hyperlipidemia, anxiety, GERD, BPH, hypothyroidism, prostate cancer s/p HIFU was seen by primary on November 13, 2022 for acute onset of lower back pain.  Was referred to medical oncology for finding of L1 lesion suspicious for metastasis.  # Multiple myeloma, RISS Stage II -MRI thoracic and lumbar spine with and without contrast was reviewed. Showed unchanged subacute compression fracture at T12.  Focal enhancing marrow replacing lesions in T6, T8, T5, T2 all suspicious for metastatic disease.  L1 lesion measuring 16 mm.  Multilevel degenerative changes present.  -SPEP/IFE showed biclonal IgA kappa paraprotein with M spike 2.1 g/dL and second spike at 0.4 g/dL.  Kappa 453, lambda 6.8 with a ratio of 66.7.  Iron panel and B12 are normal.  PSA 3.44.  CBC showed mild anemia 13.4, chronic.  On CMP normal creatinine and calcium levels.  Elevated total protein 8.8.  LDH normal.  Beta-2 microglobulin 3.7.  Albumin 3.6.  24-hour urine/UPEP showed 1.1 g protein with no M spike.  -BMBx Hypercellular marrow with 60% plasma cells.  IHC positive for CD138/ mum 1.  Kappa restricted consistent with multiple myeloma. L1 vertebral body biopsy  showed numerous plasma cells. Cytogenetics - del (13q) and gain 1.    -PET/CT showed activity in L1 and left eighth rib concerning for myeloma.  -Started on Dara-RD on 12/26/2022.  Completed cycle 1 of daratumumab ->hospitalized for urinary retention, constipation and back pain -> inpatient hospitalist transition him to hospice -> patient revoked hospice on 01/31/2023 since he was feeling better.  Multiple treatment interruptions due to UTIs and hospitalization for stroke.  -Labs reviewed and acceptable for treatment.  Will proceed with cycle 2-day 22 of daratumumab today.  Moving forward we will transition to every 2-week cycle.  Decadron 4 mg pill on day 1 and day 2.  2 pills are also causing hiccups.  -Revlimid 2.5 mg 3-week on 1 week off on hold.  Will continue to hold for at least 1 month from his stroke (stroke is unlikely to be related to Revlimid since he was not taking it at the time of the event.)  -Myeloma panel pending from today.  # Recurrent UTIs -Follows with Dr. Lonna Cobb.  Previously treated with Ceftin and ciprofloxacin.  Urine culture grew E. coli Pseudomonas in the past. -Recently completed course with doxycycline.  UTI symptoms resolved.  # Stroke - Admitted 03/07/2023 with acute onset aphasia and right hemianopia. s/p tPA with complete resolution of symptoms.  MRI brain negative.  Echo unremarkable.  On aspirin and Plavix for 21 days then aspirin alone.  On Lipitor 40 mg daily.    -I will hold Revlimid for at least 1 month poststroke.  # Intractable hiccups -Secondary to dexamethasone. -Could not tolerate 8 mg on day 1 and day 2.  Now down to 4 mg on day 1 and day 2.  # Vertebral compression fractures - Advised to obtain dental clearance prior to starting Zometa.  Appointment scheduled in October.  # T12 pathological fracture # Cancer-related pain - s/p kyphoplasty by Dr. Elijio Miles on 12/06/22.  No improvement in pain -Completed palliative RT on 01/20/2023.  -Reports  significant improvement in pain.  Completely off opioids now.  # Opioid-induced constipation -Manageable with senna, MiraLAX, lactulose, Movantik as needed.  # History of prostate cancer - follows with Dr. Evelene Croon, urology for elevated PSA.  MRI prostate showed 1 mm category 5 lesion of anterior transition zone.  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 s/p HIFU of the prostate on 02/07/2022.  PSA level from 10/08/2022 was 3.34.  -PSA from 11/21/2022 3.44.  # Poor appetite # Weight loss -Could be from cancer and uncontrolled pain.  Pain management as above. Nutrition following. -Appetite has improved.  Continue with Marinol 2.5 mg once daily.   # Anxiety -He is taking Ambien 5 mg nightly prescribed by PCP.  Orders Placed This Encounter  Procedures   CBC with Differential (Cancer Center Only)    Standing Status:   Future    Standing Expiration Date:   04/13/2024   CBC with Differential (Cancer Center Only)    Standing Status:   Future    Standing Expiration Date:   04/27/2024   RTC in 1 week for MD visit, labs, daratumumab RTC in 3 weeks for MD visit, labs, daratumumab  The total time spent in the appointment was 25 minutes encounter with patients including review of chart and various tests results, discussions about plan of care and coordination of care plan   All questions were answered. The patient knows to call the clinic with any problems, questions or concerns. No barriers to learning was detected.  [name], MD 9/16/202412:58 PM   HISTORY OF PRESENTING ILLNESS:  Melvin Gilmore 82 y.o. male with past medical history of hypertension, hyperlipidemia, anxiety, GERD, BPH, hypothyroidism, prostate cancer s/p HIFU was seen by primary on November 13, 2022 for acute onset of lower back pain.  Patient reports that he was sitting outside and had violent sneeze which was followed by severe back pain.  He has fallen allergies.  Had  x-rays followed by MRI thoracic spine without contrast.  It showed compression fracture of T12 superior endplate with surrounding marrow edema likely subacute.  15 mm T1 hypointense marrow lesion in the anterior aspect of L1 suspicious for metastasis.  Further characterization with postcontrast MRI recommended.  Prostate cancer-follows with Dr. Evelene Croon, urology for elevated PSA.  MRI prostate showed 1 mm category 5 lesion of anterior transition zone.  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 s/p HIFU of the prostate on 02/07/2022.  PSA level from 10/08/2022 was 3.34.  Interval history Patient was seen today accompanied with wife for labs and daratumumab.  Patient is feeling much better.  Completed course of doxycycline for UTI.  Symptoms have resolved.  Does complain of some discomfort in his back but is completely off opioids.  Does not think the pain is bad enough that he needs to go back on it.  Has been able to ambulate more.  Appetite has significantly improved.  I have reviewed his chart and materials related to his cancer extensively and collaborated history with the patient. Summary of oncologic history is as follows: Oncology History  Multiple myeloma  in relapse (HCC)  12/05/2022 Cancer Staging   Staging form: Plasma Cell Myeloma and Plasma Cell Disorders, AJCC 8th Edition - Clinical stage from 12/05/2022: RISS Stage II (Beta-2-microglobulin (mg/L): 3.7, Albumin (g/dL): 3.6, ISS: Stage II, High-risk cytogenetics: Absent, LDH: Normal) - Signed by [name], MD on 12/26/2022 Stage prefix: Initial diagnosis Beta 2 microglobulin range (mg/L): 3.5 to 5.49 Albumin range (g/dL): Greater than or equal to 3.5 Cytogenetics: 1q addition, Other mutation   12/13/2022 Initial Diagnosis   Multiple myeloma (HCC)   12/26/2022 - 01/24/2023 Chemotherapy   Patient is on Treatment Plan : MYELOMA NEWLY DIAGNOSED Daratumumab IV + Lenalidomide +  Dexamethasone Weekly (DaraRd) q28d     02/07/2023 - 02/07/2023 Chemotherapy   Patient is on Treatment Plan : MYELOMA NON-TRANSPLANT CANDIDATES VRd weekly q21d     02/13/2023 -  Chemotherapy   Patient is on Treatment Plan : MYELOMA NEWLY DIAGNOSED Daratumumab IV + Lenalidomide + Dexamethasone Weekly (DaraRd) q28d       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   Nausea vomiting and diarrhea 01/26/2023   Prostate cancer (HCC) 2023   Seasonal allergies    Skin cancer    a.) ears   Wears partial dentures    lower    SURGICAL HISTORY: Past Surgical History:  Procedure Laterality Date   APPENDECTOMY     CATARACT EXTRACTION W/PHACO Right 01/15/2016   Procedure: CATARACT EXTRACTION PHACO AND INTRAOCULAR LENS PLACEMENT (IOC);  Surgeon: Sherald Hess, MD;  Location: [facility];  Service: Ophthalmology;  Laterality: Right;  RIGHT CALL CELL PHONE WITH TIME   CHOLECYSTECTOMY     COLONOSCOPY     COLONOSCOPY WITH ESOPHAGOGASTRODUODENOSCOPY (EGD)     ESOPHAGOGASTRODUODENOSCOPY  07/2021   ESOPHAGOGASTRODUODENOSCOPY (EGD) WITH PROPOFOL N/A 09/23/2017   Procedure: ESOPHAGOGASTRODUODENOSCOPY (EGD) WITH PROPOFOL;  Surgeon: Toledo, Boykin Nearing, MD;  Location: [facility];  Service: Gastroenterology;  Laterality: N/A;   HIGH INTENSITY FOCUSED ULTRASOUND (HIFU) OF THE PROSTATE N/A 02/07/2022   Procedure: HIGH INTENSITY FOCUSED ULTRASOUND (HIFU) OF THE PROSTATE;  Surgeon: Orson Ape, MD;  Location: [facility];  Service: Urology;   Laterality: N/A;   HYDROCELE EXCISION / REPAIR     IR KYPHO THORACIC WITH BONE BIOPSY  12/06/2022   IR RADIOLOGIST EVAL & MGMT  11/21/2022   LASIX RT EYE     PROSTATE BIOPSY N/A 01/03/2022   Procedure: PROSTATE BIOPSY  Addison Honold;  Surgeon: Orson Ape, MD;  Location: [facility];  Service: Urology;  Laterality: N/A;   ROTATOR CUFF REPAIR Left 2001    SOCIAL HISTORY: Social History   Socioeconomic History   Marital status: Married    Spouse name: Not on file   Number of children: Not on file   Years of education: Not on file   Highest education level: Not on file  Occupational History   Not on file  Tobacco Use   Smoking status: Never   Smokeless tobacco:  Never  Vaping Use   Vaping status: Never Used  Substance and Sexual Activity   Alcohol use: Yes    Alcohol/week: 7.0 standard drinks of alcohol    Types: 7 Glasses of wine per week    Comment: wine occ   Drug use: No   Sexual activity: Not on file  Other Topics Concern   Not on file  Social History Narrative   Not on file   Social Determinants of Health   Financial Resource Strain: Low Risk  (11/21/2022)   Overall Financial Resource Strain (CARDIA)    Difficulty of Paying Living Expenses: Not hard at all  Food Insecurity: Patient Unable To Answer (03/07/2023)   Hunger Vital Sign    Worried About Running Out of Food in the Last Year: Patient unable to answer    Ran Out of Food in the Last Year: Patient unable to answer  Transportation Needs: Patient Unable To Answer (03/07/2023)   PRAPARE - Transportation    Lack of Transportation (Medical): Patient unable to answer    Lack of Transportation (Non-Medical): Patient unable to answer  Physical Activity: Not on file  Stress: Not on file  Social Connections: Not on file  Intimate Partner Violence: Patient Unable To Answer (03/07/2023)   Humiliation, Afraid, Rape, and Kick questionnaire    Fear of Current or Ex-Partner: Patient unable to answer    Emotionally Abused: Patient  unable to answer    Physically Abused: Patient unable to answer    Sexually Abused: Patient unable to answer    FAMILY HISTORY: Family History  Problem Relation Age of Onset   Pneumonia Father     ALLERGIES:  is allergic to ciprofloxacin, amoxicillin-pot clavulanate, and meloxicam.  MEDICATIONS:  Current Outpatient Medications  Medication Sig Dispense Refill   acetaminophen (TYLENOL) 500 MG tablet Take 500 mg by mouth every 6 (six) hours as needed for mild pain, moderate pain, fever or headache.     acyclovir (ZOVIRAX) 400 MG tablet Take 400 mg by mouth 2 (two) times daily.     doxycycline (VIBRA-TABS) 100 MG tablet Take 1 tablet (100 mg total) by mouth 2 (two) times daily. 14 tablet 0   levothyroxine (SYNTHROID, LEVOTHROID) 50 MCG tablet Take 50 mcg by mouth daily before breakfast.     pantoprazole (PROTONIX) 40 MG tablet Take 40 mg by mouth daily as needed.     Probiotic Product (PROBIOTIC ADVANCED PO) Take 1 tablet by mouth at bedtime.     senna (SENOKOT) 8.6 MG tablet Take 2 tablets (17.2 mg total) by mouth 2 (two) times daily. May crush, mix with water and give sublingually if needed. 28 tablet 0   zolpidem (AMBIEN) 5 MG tablet Take 1 tablet (5 mg total) by mouth at bedtime as needed for sleep. 30 tablet 0   ALPRAZolam (XANAX) 0.25 MG tablet Take 1 tablet (0.25 mg total) by mouth at bedtime as needed for anxiety. (Patient not taking: Reported on 03/31/2023) 30 tablet 0   atorvastatin (LIPITOR) 40 MG tablet Take 1 tablet (40 mg total) by mouth daily. (Patient not taking: Reported on 03/31/2023) 30 tablet 2   No current facility-administered medications for this visit.    REVIEW OF SYSTEMS:   Pertinent information mentioned in HPI All other systems were reviewed with the patient and are negative.  PHYSICAL EXAMINATION: ECOG PERFORMANCE STATUS: 1 - Symptomatic but completely ambulatory  Vitals:   04/07/23 0931  BP: 114/68  Pulse: 85  Temp: 98.2 F (36.8 C)  SpO2:  100%       Filed Weights   04/07/23 0931  Weight: 132 lb (59.9 kg)       GENERAL:alert, no distress and comfortable SKIN: skin color, texture, turgor are normal, no rashes or significant lesions EYES: normal, conjunctiva are pink and non-injected, sclera clear OROPHARYNX:no exudate, no erythema and lips, buccal mucosa, and tongue normal  NECK: supple, thyroid normal size, non-tender, without nodularity LYMPH:  no palpable lymphadenopathy in the cervical, axillary or inguinal LUNGS: clear to auscultation and percussion with normal breathing effort HEART: regular rate & rhythm and no murmurs and no lower extremity edema ABDOMEN:abdomen soft, non-tender and normal bowel sounds Musculoskeletal:no cyanosis of digits and no clubbing  PSYCH: alert & oriented x 3 with fluent speech NEURO: no focal motor/sensory deficits  LABORATORY DATA:  I have reviewed the data as listed Lab Results  Component Value Date   WBC 3.8 (L) 04/07/2023   HGB 13.7 04/07/2023   HCT 41.0 04/07/2023   MCV 99.0 04/07/2023   PLT 155 04/07/2023   Recent Labs    03/21/23 0935 03/31/23 0927 04/07/23 0906  NA 128* 129* 129*  K 4.5 3.9 3.8  CL 95* 99 96*  CO2 25 26 25   GLUCOSE 95 97 74  BUN 16 14 15   CREATININE 1.00 0.89 0.90  CALCIUM 9.3 8.8* 9.1  GFRNONAA >60 >60 >60  PROT 6.8 6.1* 6.6  ALBUMIN 4.4 3.8 4.0  AST 30 24 25   ALT 28 22 22   ALKPHOS 100 84 88  BILITOT 0.6 0.6 0.5    RADIOGRAPHIC STUDIES: I have personally reviewed the radiological images as listed and agreed with the findings in the report. ECHOCARDIOGRAM COMPLETE  Result Date: 03/08/2023    ECHOCARDIOGRAM REPORT   Patient Name:   VERGIL LOATMAN Date of Exam: 03/08/2023 Medical Rec #:  [id number]        Height:       71.0 in Accession #:    [id number]       Weight:       131.0 lb Date of Birth:  1940/12/08        BSA:          1.762 m Patient Age:    81 years         BP:           126/73 mmHg Patient Gender: M                HR:            61 bpm. Exam Location:  [facility] Procedure: 2D Echo, Cardiac Doppler, Color Doppler and Strain Analysis Indications:     Stroke 434.91 / I163.9  History:         Patient has no prior history of Echocardiogram examinations.                  Signs/Symptoms:Chest Pain; Risk Factors:Hypertension.  Sonographer:     Neysa Bonito Roar Referring Phys:  [phone] MCNEILL P KIRKPATRICK Diagnosing Phys: Armanda Magic MD  Sonographer Comments: Global longitudinal strain was attempted. IMPRESSIONS  1. Left ventricular ejection fraction, by estimation, is 60 to 65%. The left ventricle has normal function. The left ventricle has no regional wall motion abnormalities. Left ventricular diastolic parameters are consistent with Grade I diastolic dysfunction (impaired relaxation). The average left ventricular global longitudinal strain is -16.7 %. The global longitudinal strain is normal.  2. Right ventricular systolic function is normal. The right ventricular size is normal.  3.  The mitral valve is normal in structure. Mild mitral valve regurgitation. No evidence of mitral stenosis.  4. The aortic valve is tricuspid. Aortic valve regurgitation is trivial. Aortic valve sclerosis/calcification is present, without any evidence of aortic stenosis. Aortic valve area, by VTI measures 2.80 cm. Aortic valve mean gradient measures 2.0 mmHg.  Aortic valve Vmax measures 0.94 m/s.  5. Aortic dilatation noted. There is mild dilatation of the aortic root, measuring 39 mm. FINDINGS  Left Ventricle: Left ventricular ejection fraction, by estimation, is 60 to 65%. The left ventricle has normal function. The left ventricle has no regional wall motion abnormalities. The average left ventricular global longitudinal strain is -16.7 %. The global longitudinal strain is normal. The left ventricular internal cavity size was normal in size. There is no left ventricular hypertrophy. Left ventricular diastolic parameters are consistent with Grade I diastolic dysfunction  (impaired relaxation). Normal left ventricular filling pressure. Right Ventricle: The right ventricular size is normal. No increase in right ventricular wall thickness. Right ventricular systolic function is normal. Left Atrium: Left atrial size was normal in size. Right Atrium: Right atrial size was normal in size. Pericardium: There is no evidence of pericardial effusion. Mitral Valve: The mitral valve is normal in structure. Mild mitral annular calcification. Mild mitral valve regurgitation. No evidence of mitral valve stenosis. Tricuspid Valve: The tricuspid valve is normal in structure. Tricuspid valve regurgitation is trivial. No evidence of tricuspid stenosis. Aortic Valve: The aortic valve is tricuspid. Aortic valve regurgitation is trivial. Aortic valve sclerosis/calcification is present, without any evidence of aortic stenosis. Aortic valve mean gradient measures 2.0 mmHg. Aortic valve peak gradient measures 3.5 mmHg. Aortic valve area, by VTI measures 2.80 cm. Pulmonic Valve: The pulmonic valve was normal in structure. Pulmonic valve regurgitation is mild. No evidence of pulmonic stenosis. Aorta: Aortic dilatation noted. There is mild dilatation of the aortic root, measuring 39 mm. Venous: The inferior vena cava was not well visualized. IAS/Shunts: No atrial level shunt detected by color flow Doppler.  LEFT VENTRICLE PLAX 2D LVIDd:         4.30 cm   Diastology LVIDs:         3.10 cm   LV e' medial:    8.70 cm/s LV PW:         0.80 cm   LV E/e' medial:  8.0 LV IVS:        0.90 cm   LV e' lateral:   7.51 cm/s LVOT diam:     2.00 cm   LV E/e' lateral: 9.3 LV SV:         47 LV SV Index:   27        2D Longitudinal Strain LVOT Area:     3.14 cm  2D Strain GLS (A2C):   -17.0 %                          2D Strain GLS (A3C):   -17.4 %                          2D Strain GLS (A4C):   -15.7 %                          2D Strain GLS Avg:     -16.7 % RIGHT VENTRICLE RV Basal diam:  2.80 cm RV Mid diam:    2.70 cm RV S  prime:     13.70 cm/s TAPSE (M-mode): 2.1 cm LEFT ATRIUM             Index        RIGHT ATRIUM           Index LA diam:        3.00 cm 1.70 cm/m   RA Area:     14.10 cm LA Vol (A2C):   39.0 ml 22.14 ml/m  RA Volume:   30.80 ml  17.48 ml/m LA Vol (A4C):   34.2 ml 19.42 ml/m LA Biplane Vol: 37.1 ml 21.06 ml/m  AORTIC VALVE                    PULMONIC VALVE AV Area (Vmax):    2.66 cm     PV Vmax:          0.90 m/s AV Area (Vmean):   2.52 cm     PV Peak grad:     3.2 mmHg AV Area (VTI):     2.80 cm     PR End Diast Vel: 4.08 msec AV Vmax:           94.20 cm/s   RVOT Peak grad:   2 mmHg AV Vmean:          61.500 cm/s AV VTI:            0.168 m AV Peak Grad:      3.5 mmHg AV Mean Grad:      2.0 mmHg LVOT Vmax:         79.80 cm/s LVOT Vmean:        49.300 cm/s LVOT VTI:          0.150 m LVOT/AV VTI ratio: 0.89  AORTA Ao Root diam: 3.10 cm Ao Asc diam:  3.20 cm MITRAL VALVE               TRICUSPID VALVE MV Area (PHT): 3.10 cm    TR Peak grad:   19.9 mmHg MV Decel Time: 245 msec    TR Vmax:        223.00 cm/s MV E velocity: 69.70 cm/s MV A velocity: 95.90 cm/s  SHUNTS MV E/A ratio:  0.73        Systemic VTI:  0.15 m MV A Prime:    10.1 cm/s   Systemic Diam: 2.00 cm Armanda Magic MD Electronically signed by Armanda Magic MD Signature Date/Time: 03/08/2023/5:43:04 PM    Final

## 2023-04-07 NOTE — Progress Notes (Signed)
Patient had dexamethasone prescribed again, taking just one doesn't seem to bother him but when he takes 2 he gets his hiccups again. He went to a cardiologist, and all other doctor visits are scheduled. He is still having some back pain.   Should he follow up with Dr. Aggie Cosier still?

## 2023-04-07 NOTE — Patient Instructions (Signed)
[place] [facility]  Discharge Instructions: Thank you for choosing [facility] to provide your oncology and hematology care.  If you have a lab appointment with the Cancer Center, please go directly to the Cancer Center and check in at the registration area.  Wear comfortable clothing and clothing appropriate for easy access to any Portacath or PICC line.   We strive to give you quality time with your provider. You may need to reschedule your appointment if you arrive late (15 or more minutes).  Arriving late affects you and other patients whose appointments are after yours.  Also, if you miss three or more appointments without notifying the office, you may be dismissed from the clinic at the provider's discretion.      For prescription refill requests, have your pharmacy contact our office and allow 72 hours for refills to be completed.    Today you received the following chemotherapy and/or immunotherapy agents Darzalex   To help prevent nausea and vomiting after your treatment, we encourage you to take your nausea medication as directed.  BELOW ARE SYMPTOMS THAT SHOULD BE REPORTED IMMEDIATELY: *FEVER GREATER THAN 100.4 F (38 C) OR HIGHER *CHILLS OR SWEATING *NAUSEA AND VOMITING THAT IS NOT CONTROLLED WITH YOUR NAUSEA MEDICATION *UNUSUAL SHORTNESS OF BREATH *UNUSUAL BRUISING OR BLEEDING *URINARY PROBLEMS (pain or burning when urinating, or frequent urination) *BOWEL PROBLEMS (unusual diarrhea, constipation, pain near the anus) TENDERNESS IN MOUTH AND THROAT WITH OR WITHOUT PRESENCE OF ULCERS (sore throat, sores in mouth, or a toothache) UNUSUAL RASH, SWELLING OR PAIN  UNUSUAL VAGINAL DISCHARGE OR ITCHING   Items with * indicate a potential emergency and should be followed up as soon as possible or go to the Emergency Department if any problems should occur.  Please show the CHEMOTHERAPY ALERT CARD or IMMUNOTHERAPY ALERT CARD at check-in to the  Emergency Department and triage nurse.  Should you have questions after your visit or need to cancel or reschedule your appointment, please contact [place] [facility]  [phone] and follow the prompts.  Office hours are 8:00 a.m. to 4:30 p.m. Monday - Friday. Please note that voicemails left after 4:00 p.m. may not be returned until the following business day.  We are closed weekends and major holidays. You have access to a nurse at all times for urgent questions. Please call the main number to the clinic [phone] and follow the prompts.  For any non-urgent questions, you may also contact your provider using MyChart. We now offer e-Visits for anyone 31 and older to request care online for non-urgent symptoms. For details visit [email or web address].   Also download the MyChart app! Go to the app store, search "MyChart", open the app, select [place], and log in with your MyChart username and password.

## 2023-04-08 LAB — KAPPA/LAMBDA LIGHT CHAINS
Kappa free light chain: 18.9 mg/L (ref 3.3–19.4)
Kappa, lambda light chain ratio: 2.7 — ABNORMAL HIGH (ref 0.26–1.65)
Lambda free light chains: 7 mg/L (ref 5.7–26.3)

## 2023-04-10 LAB — MULTIPLE MYELOMA PANEL, SERUM
Albumin SerPl Elph-Mcnc: 3.4 g/dL (ref 2.9–4.4)
Albumin/Glob SerPl: 1.5 (ref 0.7–1.7)
Alpha 1: 0.3 g/dL (ref 0.0–0.4)
Alpha2 Glob SerPl Elph-Mcnc: 0.7 g/dL (ref 0.4–1.0)
B-Globulin SerPl Elph-Mcnc: 0.8 g/dL (ref 0.7–1.3)
Gamma Glob SerPl Elph-Mcnc: 0.6 g/dL (ref 0.4–1.8)
Globulin, Total: 2.4 g/dL (ref 2.2–3.9)
IgA: 135 mg/dL (ref 61–437)
IgG (Immunoglobin G), Serum: 566 mg/dL — ABNORMAL LOW (ref 603–1613)
IgM (Immunoglobulin M), Srm: 12 mg/dL — ABNORMAL LOW (ref 15–143)
M Protein SerPl Elph-Mcnc: 0.2 g/dL — ABNORMAL HIGH
Total Protein ELP: 5.8 g/dL — ABNORMAL LOW (ref 6.0–8.5)

## 2023-04-11 ENCOUNTER — Other Ambulatory Visit: Payer: Self-pay | Admitting: *Deleted

## 2023-04-11 DIAGNOSIS — C9002 Multiple myeloma in relapse: Secondary | ICD-10-CM

## 2023-04-14 ENCOUNTER — Inpatient Hospital Stay (HOSPITAL_BASED_OUTPATIENT_CLINIC_OR_DEPARTMENT_OTHER): Payer: Medicare HMO | Admitting: Internal Medicine

## 2023-04-14 ENCOUNTER — Inpatient Hospital Stay: Payer: Medicare HMO

## 2023-04-14 VITALS — BP 118/67 | HR 89 | Temp 98.0°F | Wt 132.2 lb

## 2023-04-14 DIAGNOSIS — C9002 Multiple myeloma in relapse: Secondary | ICD-10-CM | POA: Diagnosis not present

## 2023-04-14 DIAGNOSIS — C9000 Multiple myeloma not having achieved remission: Secondary | ICD-10-CM | POA: Diagnosis not present

## 2023-04-14 DIAGNOSIS — Z5112 Encounter for antineoplastic immunotherapy: Secondary | ICD-10-CM | POA: Diagnosis not present

## 2023-04-14 LAB — URINALYSIS, COMPLETE (UACMP) WITH MICROSCOPIC
Bilirubin Urine: NEGATIVE
Glucose, UA: NEGATIVE mg/dL
Ketones, ur: NEGATIVE mg/dL
Nitrite: POSITIVE — AB
Protein, ur: NEGATIVE mg/dL
Specific Gravity, Urine: 1.008 (ref 1.005–1.030)
Squamous Epithelial / HPF: 0 /HPF (ref 0–5)
WBC, UA: >50 WBC/hpf (ref 0–5)
pH: 6 (ref 5.0–8.0)

## 2023-04-14 LAB — CBC WITH DIFFERENTIAL (CANCER CENTER ONLY)
Abs Immature Granulocytes: 0.02 10*3/uL (ref 0.00–0.07)
Basophils Absolute: 0 10*3/uL (ref 0.0–0.1)
Basophils Relative: 0 %
Eosinophils Absolute: 0 10*3/uL (ref 0.0–0.5)
Eosinophils Relative: 1 %
HCT: 39.4 % (ref 39.0–52.0)
Hemoglobin: 13 g/dL (ref 13.0–17.0)
Immature Granulocytes: 0 %
Lymphocytes Relative: 26 %
Lymphs Abs: 1.2 10*3/uL (ref 0.7–4.0)
MCH: 32.3 pg (ref 26.0–34.0)
MCHC: 33 g/dL (ref 30.0–36.0)
MCV: 97.8 fL (ref 80.0–100.0)
Monocytes Absolute: 0.3 10*3/uL (ref 0.1–1.0)
Monocytes Relative: 6 %
Neutro Abs: 3.1 10*3/uL (ref 1.7–7.7)
Neutrophils Relative %: 67 %
Platelet Count: 155 10*3/uL (ref 150–400)
RBC: 4.03 MIL/uL — ABNORMAL LOW (ref 4.22–5.81)
RDW: 13.9 % (ref 11.5–15.5)
WBC Count: 4.7 10*3/uL (ref 4.0–10.5)
nRBC: 0 % (ref 0.0–0.2)

## 2023-04-14 LAB — CMP (CANCER CENTER ONLY)
ALT: 27 U/L (ref 0–44)
AST: 36 U/L (ref 15–41)
Albumin: 3.8 g/dL (ref 3.5–5.0)
Alkaline Phosphatase: 83 U/L (ref 38–126)
Anion gap: 9 (ref 5–15)
BUN: 16 mg/dL (ref 8–23)
CO2: 25 mmol/L (ref 22–32)
Calcium: 9 mg/dL (ref 8.9–10.3)
Chloride: 94 mmol/L — ABNORMAL LOW (ref 98–111)
Creatinine: 0.91 mg/dL (ref 0.61–1.24)
GFR, Estimated: >60 mL/min (ref >60)
Glucose, Bld: 130 mg/dL — ABNORMAL HIGH (ref 70–99)
Potassium: 3.9 mmol/L (ref 3.5–5.1)
Sodium: 128 mmol/L — ABNORMAL LOW (ref 135–145)
Total Bilirubin: 0.7 mg/dL (ref 0.3–1.2)
Total Protein: 6.2 g/dL — ABNORMAL LOW (ref 6.5–8.1)

## 2023-04-14 MED ORDER — ACETAMINOPHEN 325 MG PO TABS: 650.0000 mg | ORAL_TABLET | Freq: Once | ORAL | Status: DC

## 2023-04-14 MED ORDER — DIPHENHYDRAMINE HCL 25 MG PO CAPS: 50.0000 mg | ORAL_CAPSULE | Freq: Once | ORAL | Status: DC

## 2023-04-14 MED ORDER — DARATUMUMAB-HYALURONIDASE-FIHJ 1800-30000 MG-UT/15ML ~~LOC~~ SOLN
1800.0000 mg | Freq: Once | SUBCUTANEOUS | Status: AC
  Administered 2023-04-14: 1800 mg via SUBCUTANEOUS
  Filled 2023-04-14: qty 15

## 2023-04-14 NOTE — Progress Notes (Signed)
[facility] CONSULT NOTE  Patient Care Team: [name], MD as PCP - General (Internal Medicine) [name], MD as Consulting Physician (Oncology)  CANCER STAGING   Cancer Staging  Multiple myeloma in relapse [facility]) Staging form: Plasma Cell Myeloma and Plasma Cell Disorders, AJCC 8th Edition - Clinical stage from 12/05/2022: RISS Stage II (Beta-2-microglobulin (mg/L): 3.7, Albumin (g/dL): 3.6, ISS: Stage II, High-risk cytogenetics: Absent, LDH: Normal) - Signed by [name], MD on 12/26/2022 Stage prefix: Initial diagnosis Beta 2 microglobulin range (mg/L): 3.5 to 5.49 Albumin range (g/dL): Greater than or equal to 3.5 Cytogenetics: 1q addition, Other mutation   ASSESSMENT & PLAN:  Melvin Gilmore 82 y.o. male with pmh of hypertension, hyperlipidemia, anxiety, GERD, BPH, hypothyroidism, prostate cancer s/p HIFU was seen by primary on November 13, 2022 for acute onset of lower back pain.  Was referred to medical oncology for finding of L1 lesion suspicious for metastasis.  # Multiple myeloma, RISS Stage II -MRI thoracic and lumbar spine with and without contrast was reviewed. Showed unchanged subacute compression fracture at T12.  Focal enhancing marrow replacing lesions in T6, T8, T5, T2 all suspicious for metastatic disease.  L1 lesion measuring 16 mm.  Multilevel degenerative changes present.  -SPEP/IFE showed biclonal IgA kappa paraprotein with M spike 2.1 g/dL and second spike at 0.4 g/dL.  Kappa 453, lambda 6.8 with a ratio of 66.7.  Iron panel and B12 are normal.  PSA 3.44.  CBC showed mild anemia 13.4, chronic.  On CMP normal creatinine and calcium levels.  Elevated total protein 8.8.  LDH normal.  Beta-2 microglobulin 3.7.  Albumin 3.6.  24-hour urine/UPEP showed 1.1 g protein with no M spike.  -BMBx Hypercellular marrow with 60% plasma cells.  IHC positive for CD138/ mum 1.  Kappa restricted consistent with multiple myeloma. L1 vertebral body biopsy  showed numerous plasma cells. Cytogenetics - del (13q) and gain 1.    -PET/CT showed activity in L1 and left eighth rib concerning for myeloma.  -Started on Dara-RD on 12/26/2022.  Completed cycle 1 of daratumumab ->hospitalized for urinary retention, constipation and back pain -> inpatient hospitalist transition him to hospice -> patient revoked hospice on 01/31/2023 since he was feeling better.  Multiple treatment interruptions due to UTIs and hospitalization for stroke.  -Labs reviewed and acceptable for treatment.  Will proceed with cycle 3-day 1 of daratumumab today.  Decadron 4 mg day 1 and day 2.  Patient cannot tolerate higher dose of Decadron due to hiccups.  Myeloma panel was reviewed.  M spike is 0.2 with IgG kappa from daratumumab and IgA kappa.  Kappa light chain has normalized.  Will monitor panel once a month.  He is overall doing better with his treatment.  He will come in directly for labs and cycle 3-day 15 of daratumumab.  I will see him with cycle 4-day 1.  -Revlimid 2.5 mg 3-week on 1 week off on hold.  Will continue to hold for at least 1 month from his stroke (stroke is unlikely to be related to Revlimid since he was not taking it at the time of the event.)   # Recurrent UTIs -Follows with Dr. Lonna Cobb.  Previously treated with Ceftin and ciprofloxacin.  Urine culture grew E. coli Pseudomonas in the past. -Recently completed course with doxycycline.  -Patient requesting for UA check.  # Stroke - Admitted 03/07/2023 with acute onset aphasia and right hemianopia. s/p tPA with complete resolution of symptoms.  MRI brain negative.  Echo unremarkable.  On aspirin and Plavix for 21 days then aspirin alone.  On Lipitor 40 mg daily.    -I will hold Revlimid for at least 1 month poststroke.  # Intractable hiccups -Secondary to dexamethasone. -Could not tolerate 8 mg on day 1 and day 2.  Now down to 4 mg on day 1 and day 2.  # Vertebral compression fractures - Advised to obtain  dental clearance prior to starting Zometa.  Appointment scheduled in October.  # T12 pathological fracture # Cancer-related pain - s/p kyphoplasty by Dr. Elijio Miles on 12/06/22.  No improvement in pain -Completed palliative RT on 01/20/2023.  -Reports significant improvement in pain.  Completely off opioids now.  # Constipation -Manageable with senna, MiraLAX, lactulose, Movantik as needed.  # History of prostate cancer - follows with Dr. Evelene Croon, urology for elevated PSA.  MRI prostate showed 1 mm category 5 lesion of anterior transition zone.  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 s/p HIFU of the prostate on 02/07/2022.  PSA level from 10/08/2022 was 3.34.  -PSA from 11/21/2022 3.44.  # Poor appetite # Weight loss -Could be from cancer and uncontrolled pain.  Pain management as above. Nutrition following. -Appetite has improved.  Continue with Marinol 2.5 mg once daily.   # Anxiety -He is taking Ambien 5 mg nightly prescribed by PCP.  Orders Placed This Encounter  Procedures   CBC with Differential (Cancer Center Only)    Standing Status:   Future    Standing Expiration Date:   05/11/2024   Urinalysis, Complete w Microscopic    Standing Status:   Future    Number of Occurrences:   1    Standing Expiration Date:   04/13/2024   RTC in 2 weeks for labs, cycle 3-day 15 of daratumumab RTC in 4 weeks for MD visit, labs, cycle 4-day 1 of Dara  The total time spent in the appointment was 30 minutes encounter with patients including review of chart and various tests results, discussions about plan of care and coordination of care plan   All questions were answered. The patient knows to call the clinic with any problems, questions or concerns. No barriers to learning was detected.  [name], MD 9/23/202412:19 PM   HISTORY OF PRESENTING ILLNESS:  Melvin Gilmore 82 y.o. male with past medical history of hypertension,  hyperlipidemia, anxiety, GERD, BPH, hypothyroidism, prostate cancer s/p HIFU was seen by primary on November 13, 2022 for acute onset of lower back pain.  Patient reports that he was sitting outside and had violent sneeze which was followed by severe back pain.  He has fallen allergies.  Had x-rays followed by MRI thoracic spine without contrast.  It showed compression fracture of T12 superior endplate with surrounding marrow edema likely subacute.  15 mm T1 hypointense marrow lesion in the anterior aspect of L1 suspicious for metastasis.  Further characterization with postcontrast MRI recommended.  Prostate cancer-follows with Dr. Evelene Croon, urology for elevated PSA.  MRI prostate showed 1 mm category 5 lesion of anterior transition zone.  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 s/p HIFU of the prostate on 02/07/2022.  PSA level from 10/08/2022 was 3.34.  Interval history Patient was seen today accompanied with wife for labs and daratumumab.  Continues to feel better.  Urinary symptoms have largely resolved.  Does report mild burning when he does not go to bathroom for 3 to 4 hours.  Reports some pain in the back with certain activity.  We discussed about doing Tylenol or going back on morphine as needed.  We also discussed about options for kyphoplasty at L1 or L3 which she would like to hold off at this time.  I have reviewed his chart and materials related to his cancer extensively and collaborated history with the patient. Summary of oncologic history is as follows: Oncology History  Multiple myeloma in relapse (HCC)  12/05/2022 Cancer Staging   Staging form: Plasma Cell Myeloma and Plasma Cell Disorders, AJCC 8th Edition - Clinical stage from 12/05/2022: RISS Stage II (Beta-2-microglobulin (mg/L): 3.7, Albumin (g/dL): 3.6, ISS: Stage II, High-risk cytogenetics: Absent, LDH: Normal) - Signed by [name], MD on 12/26/2022 Stage prefix: Initial  diagnosis Beta 2 microglobulin range (mg/L): 3.5 to 5.49 Albumin range (g/dL): Greater than or equal to 3.5 Cytogenetics: 1q addition, Other mutation   12/13/2022 Initial Diagnosis   Multiple myeloma (HCC)   12/26/2022 - 01/24/2023 Chemotherapy   Patient is on Treatment Plan : MYELOMA NEWLY DIAGNOSED Daratumumab IV + Lenalidomide + Dexamethasone Weekly (DaraRd) q28d     02/07/2023 - 02/07/2023 Chemotherapy   Patient is on Treatment Plan : MYELOMA NON-TRANSPLANT CANDIDATES VRd weekly q21d     02/13/2023 -  Chemotherapy   Patient is on Treatment Plan : MYELOMA NEWLY DIAGNOSED Daratumumab IV + Lenalidomide + Dexamethasone Weekly (DaraRd) q28d       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   Nausea vomiting and diarrhea 01/26/2023   Prostate cancer (HCC) 2023   Seasonal allergies    Skin cancer    a.) ears   Wears partial dentures    lower    SURGICAL HISTORY: Past Surgical History:  Procedure Laterality Date   APPENDECTOMY     CATARACT EXTRACTION W/PHACO Right 01/15/2016   Procedure: CATARACT EXTRACTION PHACO AND INTRAOCULAR LENS PLACEMENT (IOC);  Surgeon: Sherald Hess, MD;  Location: [facility];  Service: Ophthalmology;  Laterality: Right;  RIGHT CALL CELL PHONE WITH TIME   CHOLECYSTECTOMY     COLONOSCOPY     COLONOSCOPY WITH ESOPHAGOGASTRODUODENOSCOPY (EGD)     ESOPHAGOGASTRODUODENOSCOPY  07/2021   ESOPHAGOGASTRODUODENOSCOPY (EGD) WITH PROPOFOL N/A 09/23/2017   Procedure: ESOPHAGOGASTRODUODENOSCOPY (EGD) WITH  PROPOFOL;  Surgeon: Toledo, Boykin Nearing, MD;  Location: [facility];  Service: Gastroenterology;  Laterality: N/A;   HIGH INTENSITY FOCUSED ULTRASOUND (HIFU) OF THE PROSTATE N/A 02/07/2022   Procedure: HIGH INTENSITY FOCUSED ULTRASOUND (HIFU) OF THE PROSTATE;  Surgeon: Orson Ape, MD;  Location: [facility];  Service: Urology;  Laterality: N/A;   HYDROCELE EXCISION / REPAIR     IR KYPHO THORACIC WITH BONE BIOPSY  12/06/2022   IR RADIOLOGIST EVAL & MGMT  11/21/2022   LASIX RT EYE     PROSTATE BIOPSY N/A 01/03/2022   Procedure: PROSTATE BIOPSY  Addison Flaharty;  Surgeon: Orson Ape, MD;  Location: [facility];  Service: Urology;  Laterality: N/A;  ROTATOR CUFF REPAIR Left 2001    SOCIAL HISTORY: Social History   Socioeconomic History   Marital status: Married    Spouse name: Not on file   Number of children: Not on file   Years of education: Not on file   Highest education level: Not on file  Occupational History   Not on file  Tobacco Use   Smoking status: Never   Smokeless tobacco: Never  Vaping Use   Vaping status: Never Used  Substance and Sexual Activity   Alcohol use: Yes    Alcohol/week: 7.0 standard drinks of alcohol    Types: 7 Glasses of wine per week    Comment: wine occ   Drug use: No   Sexual activity: Not on file  Other Topics Concern   Not on file  Social History Narrative   Not on file   Social Determinants of Health   Financial Resource Strain: Low Risk  (11/21/2022)   Overall Financial Resource Strain (CARDIA)    Difficulty of Paying Living Expenses: Not hard at all  Food Insecurity: Patient Unable To Answer (03/07/2023)   Hunger Vital Sign    Worried About Running Out of Food in the Last Year: Patient unable to answer    Ran Out of Food in the Last Year: Patient unable to answer  Transportation Needs: Patient Unable To Answer (03/07/2023)   PRAPARE - Transportation    Lack of Transportation (Medical): Patient unable to answer    Lack of Transportation  (Non-Medical): Patient unable to answer  Physical Activity: Not on file  Stress: Not on file  Social Connections: Not on file  Intimate Partner Violence: Patient Unable To Answer (03/07/2023)   Humiliation, Afraid, Rape, and Kick questionnaire    Fear of Current or Ex-Partner: Patient unable to answer    Emotionally Abused: Patient unable to answer    Physically Abused: Patient unable to answer    Sexually Abused: Patient unable to answer    FAMILY HISTORY: Family History  Problem Relation Age of Onset   Pneumonia Father     ALLERGIES:  is allergic to ciprofloxacin, amoxicillin-pot clavulanate, and meloxicam.  MEDICATIONS:  Current Outpatient Medications  Medication Sig Dispense Refill   acetaminophen (TYLENOL) 500 MG tablet Take 500 mg by mouth every 6 (six) hours as needed for mild pain, moderate pain, fever or headache.     acyclovir (ZOVIRAX) 400 MG tablet Take 400 mg by mouth 2 (two) times daily.     doxycycline (VIBRA-TABS) 100 MG tablet Take 1 tablet (100 mg total) by mouth 2 (two) times daily. 14 tablet 0   levothyroxine (SYNTHROID, LEVOTHROID) 50 MCG tablet Take 50 mcg by mouth daily before breakfast.     pantoprazole (PROTONIX) 40 MG tablet Take 40 mg by mouth daily as needed.     Probiotic Product (PROBIOTIC ADVANCED PO) Take 1 tablet by mouth at bedtime.     senna (SENOKOT) 8.6 MG tablet Take 2 tablets (17.2 mg total) by mouth 2 (two) times daily. May crush, mix with water and give sublingually if needed. 28 tablet 0   zolpidem (AMBIEN) 5 MG tablet Take 1 tablet (5 mg total) by mouth at bedtime as needed for sleep. 30 tablet 0   ALPRAZolam (XANAX) 0.25 MG tablet Take 1 tablet (0.25 mg total) by mouth at bedtime as needed for anxiety. (Patient not taking: Reported on 03/31/2023) 30 tablet 0   atorvastatin (LIPITOR) 40 MG tablet Take 1 tablet (40 mg total) by mouth daily. (Patient  not taking: Reported on 03/31/2023) 30 tablet 2   No current facility-administered medications for  this visit.   Facility-Administered Medications Ordered in Other Visits  Medication Dose Route Frequency Provider Last Rate Last Admin   acetaminophen (TYLENOL) tablet 650 mg  650 mg Oral Once [name], MD       diphenhydrAMINE (BENADRYL) capsule 50 mg  50 mg Oral Once [name], MD        REVIEW OF SYSTEMS:   Pertinent information mentioned in HPI All other systems were reviewed with the patient and are negative.  PHYSICAL EXAMINATION: ECOG PERFORMANCE STATUS: 1 - Symptomatic but completely ambulatory  Vitals:   04/14/23 0852  BP: 118/67  Pulse: 89  Temp: 98 F (36.7 C)  SpO2: 98%      Filed Weights   04/14/23 0852  Weight: 132 lb 3.2 oz (60 kg)       GENERAL:alert, no distress and comfortable SKIN: skin color, texture, turgor are normal, no rashes or significant lesions EYES: normal, conjunctiva are pink and non-injected, sclera clear OROPHARYNX:no exudate, no erythema and lips, buccal mucosa, and tongue normal  NECK: supple, thyroid normal size, non-tender, without nodularity LYMPH:  no palpable lymphadenopathy in the cervical, axillary or inguinal LUNGS: clear to auscultation and percussion with normal breathing effort HEART: regular rate & rhythm and no murmurs and no lower extremity edema ABDOMEN:abdomen soft, non-tender and normal bowel sounds Musculoskeletal:no cyanosis of digits and no clubbing  PSYCH: alert & oriented x 3 with fluent speech NEURO: no focal motor/sensory deficits  LABORATORY DATA:  I have reviewed the data as listed Lab Results  Component Value Date   WBC 4.7 04/14/2023   HGB 13.0 04/14/2023   HCT 39.4 04/14/2023   MCV 97.8 04/14/2023   PLT 155 04/14/2023   Recent Labs    03/31/23 0927 04/07/23 0906 04/14/23 0830  NA 129* 129* 128*  K 3.9 3.8 3.9  CL 99 96* 94*  CO2 26 25 25   GLUCOSE 97 74 130*  BUN 14 15 16   CREATININE 0.89 0.90 0.91  CALCIUM 8.8* 9.1 9.0  GFRNONAA >60 >60 >60  PROT 6.1* 6.6 6.2*  ALBUMIN  3.8 4.0 3.8  AST 24 25 36  ALT 22 22 27   ALKPHOS 84 88 83  BILITOT 0.6 0.5 0.7    RADIOGRAPHIC STUDIES: I have personally reviewed the radiological images as listed and agreed with the findings in the report. No results found.

## 2023-04-14 NOTE — Progress Notes (Signed)
Patient is wanting to make sure that he can take his pre meds right now? He would like to check to see if he doesn't still have a uti. Plus patient and family are wondering why his chart says myeloma relapse?

## 2023-04-15 ENCOUNTER — Encounter: Payer: Self-pay | Admitting: Internal Medicine

## 2023-04-15 ENCOUNTER — Telehealth: Payer: Self-pay

## 2023-04-15 NOTE — Telephone Encounter (Signed)
Spoke to patient about his urinalysis results, which showed that he might still have a UTI, and patients wife said that she was going to reach out to their urologist to maybe get them to give [name] some more anabiotics.

## 2023-04-16 ENCOUNTER — Ambulatory Visit
Admission: RE | Admit: 2023-04-16 | Discharge: 2023-04-16 | Disposition: A | Discharge status: Home / Self Care | Payer: Medicare HMO | Source: Ambulatory Visit | Attending: Internal Medicine | Admitting: Internal Medicine

## 2023-04-16 DIAGNOSIS — E782 Mixed hyperlipidemia: Secondary | ICD-10-CM | POA: Insufficient documentation

## 2023-04-16 DIAGNOSIS — I4940 Unspecified premature depolarization: Secondary | ICD-10-CM | POA: Insufficient documentation

## 2023-04-16 NOTE — Progress Notes (Signed)
04/17/2023 3:42 PM   Melvin Gilmore 02-Nov-1940 [id number]  Referring provider: [name], MD [address] [facility] [place],  [place] [address]  Urological history: 1.  Prostate cancer -PSA (11/2022) - 3.44 -Previously followed by Dr. 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 Prostate biopsy performed 01/03/22 for a PSA of 8.53 and MRI showing a PI-RADS5 lesion at the left anterior transition zone. Fusion biopsy return Gleason 3+4 adenocarcinoma on the ROI cores. -He underwent HIFU by Dr. Evelene Croon 02/07/22  2.  Urinary retention -secondary to constipation (01/2023)  Chief Complaint  Patient presents with   Follow-up   HPI: Melvin Gilmore is a 82 y.o. male who presents today for dysuria.    Previous records reviewed.    He has had 2 urinary tract infections since July.  The first was for E. coli and Pseudomonas aeruginosa and the second was positive for E. coli.  He continues to have issues with frequency, dysuria and lower back pain.  Patient denies any modifying or aggravating factors.  Patient denies any recent UTI's, gross hematuria, dysuria or suprapubic/flank pain.  Patient denies any fevers, chills, nausea or vomiting.    UA yellow cloudy, specific gravity 1.010, trace blood, pH 6.0, nitrate positive, 2+ leukocyte, greater than 30 WBCs, 0-2 RBCs, 0-10 epithelial cells and many bacteria.  Contrast CT from July revealed a distended bladder and some mild hydronephrosis.  This has since resolved.   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   Distal with moderate free air surrounding Hiatal Hernia   Gallbladder polyp    Gastritis    GERD (gastroesophageal reflux disease)    History of hiatal hernia    HLD (hyperlipidemia)    HTN (hypertension)    Hypothyroidism    Meniere disease    vertigo  and 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   Nausea vomiting and diarrhea 01/26/2023   Prostate cancer (HCC) 2023   Seasonal allergies    Skin cancer    a.) ears   Wears partial dentures    lower    Surgical History: Past Surgical History:  Procedure Laterality Date   APPENDECTOMY     CATARACT EXTRACTION W/PHACO Right 01/15/2016   Procedure: CATARACT EXTRACTION PHACO AND INTRAOCULAR LENS PLACEMENT (IOC);  Surgeon: Sherald Hess, MD;  Location: [facility];  Service: Ophthalmology;  Laterality: Right;  RIGHT CALL CELL PHONE WITH TIME   CHOLECYSTECTOMY     COLONOSCOPY     COLONOSCOPY WITH ESOPHAGOGASTRODUODENOSCOPY (EGD)     ESOPHAGOGASTRODUODENOSCOPY  07/2021   ESOPHAGOGASTRODUODENOSCOPY (EGD) WITH PROPOFOL N/A 09/23/2017   Procedure: ESOPHAGOGASTRODUODENOSCOPY (EGD) WITH PROPOFOL;  Surgeon: Toledo, Boykin Nearing, MD;  Location: [facility];  Service: Gastroenterology;  Laterality: N/A;   HIGH INTENSITY FOCUSED ULTRASOUND (HIFU) OF THE PROSTATE N/A 02/07/2022   Procedure: HIGH INTENSITY FOCUSED ULTRASOUND (HIFU) OF THE PROSTATE;  Surgeon: Orson Ape, MD;  Location: [facility];  Service: Urology;  Laterality: N/A;   HYDROCELE EXCISION / REPAIR     IR KYPHO THORACIC WITH BONE BIOPSY  12/06/2022   IR RADIOLOGIST EVAL & MGMT  11/21/2022   LASIX RT EYE     PROSTATE BIOPSY N/A 01/03/2022   Procedure: PROSTATE BIOPSY  Melvin Gilmore;  Surgeon: Orson Ape, MD;  Location: [facility];  Service: Urology;  Laterality: N/A;   ROTATOR CUFF REPAIR Left 2001    Home Medications:  Allergies as of 04/17/2023       Reactions   Ciprofloxacin Diarrhea   He experienced an elevation in blood pressure, shortness of breath and diarrhea when he took the Cipro.    Amoxicillin-pot Clavulanate Rash, Other (See Comments)   Pt states he can't recall what the reactions were   Meloxicam Rash, Other (See Comments)        Medication List        Accurate as of April 17, 2023 11:59 PM.  If you have any questions, ask your nurse or doctor.          STOP taking these medications    doxycycline 100 MG tablet Commonly known as: VIBRA-TABS Stopped by: Haydan Mansouri       TAKE these medications    acetaminophen 500 MG tablet Commonly known as: TYLENOL Take 500 mg by mouth every 6 (six) hours as needed for mild pain, moderate pain, fever or headache.   acyclovir 400 MG tablet Commonly known as: ZOVIRAX Take 400 mg by mouth 2 (two) times daily.   ALPRAZolam 0.25 MG tablet Commonly known as: XANAX Take 1 tablet (0.25 mg total) by mouth at bedtime as needed for anxiety.   atorvastatin 40 MG tablet Commonly known as: LIPITOR Take 1 tablet (40 mg total) by mouth daily.   levothyroxine 50 MCG tablet Commonly known as: SYNTHROID Take 50 mcg by mouth daily before breakfast.   pantoprazole 40 MG tablet Commonly known as: PROTONIX Take 40 mg by mouth daily as needed.   PROBIOTIC ADVANCED PO Take 1 tablet by mouth at bedtime.   senna 8.6 MG tablet Commonly known as: Senokot Take 2 tablets (17.2 mg total) by mouth 2 (two) times daily. May crush, mix with water and give sublingually if needed.   sulfamethoxazole-trimethoprim 800-160 MG tablet Commonly known as: BACTRIM DS Take 1 tablet by mouth every 12 (twelve) hours. Started by: Michiel Cowboy   zolpidem 5 MG tablet Commonly known as: AMBIEN Take 1 tablet (5 mg total) by mouth at bedtime as needed for sleep.        Allergies:  Allergies  Allergen Reactions   Ciprofloxacin Diarrhea    He experienced an elevation in blood pressure, shortness of breath and diarrhea when he took the Cipro.    Amoxicillin-Pot Clavulanate Rash and Other (See Comments)    Pt states he can't recall what the reactions were   Meloxicam Rash and Other (See Comments)    Family History: Family History  Problem Relation Age of Onset   Pneumonia Father     Social History:  reports that he has never smoked. He has never  used smokeless tobacco. He reports current alcohol use of about 7.0 standard drinks of alcohol per week. He reports that he does not use drugs.  ROS: Pertinent ROS in HPI  Physical Exam: BP 128/73   Pulse 77   Constitutional:  Well nourished. Alert and oriented, No acute distress. HEENT:  AT, moist mucus membranes.  Trachea midline Cardiovascular: No clubbing, cyanosis, or edema. Respiratory: Normal respiratory effort, no increased work of breathing. Neurologic: Grossly intact, no focal deficits, moving all 4 extremities. Psychiatric: Normal mood and affect.   Laboratory Data: Lab Results  Component Value Date   WBC 4.7 04/14/2023   HGB 13.0 04/14/2023   HCT 39.4 04/14/2023   MCV 97.8 04/14/2023   PLT 155 04/14/2023  Lab Results  Component Value Date   CREATININE 0.91 04/14/2023    Lab Results  Component Value Date   AST 36 04/14/2023   Lab Results  Component Value Date   ALT 27 04/14/2023    Urinalysis Component     Latest Ref Rng 04/17/2023  Glucose, UA     Negative  Negative   Specific Gravity, UA     1.005 - 1.030  1.010   pH, UA     5.0 - 7.5  6.0   Color, UA     Yellow  Yellow   Appearance Ur     Clear  Cloudy !   Protein,UA     Negative/Trace  Negative   Ketones, UA     Negative  Negative   Microscopic Examination See below:   Leukocytes,UA     Negative  2+ !   RBC, UA     Negative  Trace !   Bilirubin, UA     Negative  Negative   Urobilinogen, Ur     0.2 - 1.0 mg/dL 0.2   Nitrite, UA     Negative  Positive !     Legend: ! Abnormal  Component     Latest Ref Rng 04/17/2023  WBC, UA     0 - 5 /hpf >30 !   Bacteria, UA     None seen/Few  Many !   RBC, Urine     0 - 2 /hpf 0-2   Epithelial Cells (non renal)     0 - 10 /hpf 0-10     Legend: ! Abnormal I have reviewed the labs.   Pertinent Imaging: CLINICAL DATA:  Abdominal pain.  Weakness.   EXAM: CT ABDOMEN AND PELVIS WITH CONTRAST   TECHNIQUE: Multidetector CT imaging  of the abdomen and pelvis was performed using the standard protocol following bolus administration of intravenous contrast.   RADIATION DOSE REDUCTION: This exam was performed according to the departmental dose-optimization program which includes automated exposure control, adjustment of the mA and/or kV according to patient size and/or use of iterative reconstruction technique.   CONTRAST:  80mL OMNIPAQUE IOHEXOL 300 MG/ML  SOLN   COMPARISON:  08/03/2021.   FINDINGS: Lower chest: No acute findings. 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   Hepatobiliary: Liver normal in size. 2 cysts in segment 2, smaller than on the prior CT. Largest measures 2 cm. No other liver masses or lesions. Status post cholecystectomy. No bile duct dilation.   Pancreas: Unremarkable. No pancreatic ductal dilatation or surrounding inflammatory changes.   Spleen: Normal in size without focal abnormality.   Adrenals/Urinary Tract: Normal adrenal glands. Kidneys normal in size, orientation and position with symmetric enhancement and excretion. 1.8 cm posterior midpole right renal cyst, stable with no follow-up indicated. No other renal masses. Mild bilateral hydronephrosis and hydroureter. No renal or ureteral stones. Bladder is distended. No bladder wall thickening, mass or stone.   Stomach/Bowel: Small hiatal hernia.  Stomach otherwise unremarkable.   Small bowel and colon are normal in caliber. No wall thickening or inflammation. Mild to moderate increase in the colonic stool burden. Rectum moderately distended with stool.   Vascular/Lymphatic: Aortic atherosclerosis. No aneurysm. No enlarged lymph nodes.   Reproductive: Unremarkable.   Other: No ascites.  No hernia.   Musculoskeletal: There are compression fractures of T12, L1 and L3, T12 previously treated with vertebroplasty. These fractures are new since the prior CT. Numerous small lucencies are  seen  throughout the visualized skeleton. Findings consistent with multiple myeloma. PET-CT performed on 12/25/2022 has an indication of initial treatment strategy for multiple myeloma.   IMPRESSION: 1. There are vertebral fractures as detailed all new since the CT from 08/03/2021. Only the T12 fracture was evident on an MRI from 11/28/2022. The L1 and L3 fractures may be recent. There are underlying numerous small lucencies throughout the visualized skeleton consistent with multiple myeloma. 2. Significant bladder distension, which is the presumed cause of mild bilateral hydroureteronephrosis. 3. No other acute abnormality within the abdomen or pelvis. 4. Increased colonic stool burden and rectum moderately distended with stool. 5. Aortic atherosclerosis.     Electronically Signed   By: Amie Portland M.D.   On: 01/26/2023 16:41 I have independently reviewed the films.  See HPI.    Assessment & Plan:    1. rUTI -UA w/ pyuria and bacteruria -urine sent for culture  --Started empirically on Septra DS, will adjust if necessary once urine culture and sensitivity results are available  -Explained that the issue may be that he has not been adequately treated as he had a difficult time tolerating Cipro and we had to change to gentamicin IM injections, or the issue may be he is continuing to reduce different organisms and we have to address them 1 at a time or because of his comorbidities his immune system may be taxed and that why he continues to have issues with infections -After he completes the Septra DS and if urine sensitivities suggest there is sensitivities to Septra DS, I will place him on a daily dose of trimethoprim and we will follow-up in 3 months  2. Urinary retention -Last 2 bladder scans indicate adequate bladder emptying, so recurrent UTIs is not due to retention  Return for pending urine culture results .  These notes generated with voice recognition software. I  apologize for typographical errors.  Cloretta Ned  [facility] [address]  [address] [place], [place] [address] [phone]

## 2023-04-16 NOTE — Telephone Encounter (Signed)
.  left message to have patient return my call.  

## 2023-04-17 ENCOUNTER — Ambulatory Visit: Payer: Medicare HMO | Admitting: Urology

## 2023-04-17 ENCOUNTER — Encounter: Payer: Self-pay | Admitting: Urology

## 2023-04-17 VITALS — BP 128/73 | HR 77

## 2023-04-17 DIAGNOSIS — R300 Dysuria: Secondary | ICD-10-CM

## 2023-04-17 DIAGNOSIS — R339 Retention of urine, unspecified: Secondary | ICD-10-CM

## 2023-04-17 DIAGNOSIS — R82998 Other abnormal findings in urine: Secondary | ICD-10-CM

## 2023-04-17 DIAGNOSIS — R8271 Bacteriuria: Secondary | ICD-10-CM

## 2023-04-17 DIAGNOSIS — R8281 Pyuria: Secondary | ICD-10-CM

## 2023-04-17 DIAGNOSIS — N390 Urinary tract infection, site not specified: Secondary | ICD-10-CM

## 2023-04-17 LAB — URINALYSIS, COMPLETE
Bilirubin, UA: NEGATIVE
Glucose, UA: NEGATIVE
Ketones, UA: NEGATIVE
Nitrite, UA: POSITIVE — AB
Protein,UA: NEGATIVE
Specific Gravity, UA: 1.01 (ref 1.005–1.030)
Urobilinogen, Ur: 0.2 mg/dL (ref 0.2–1.0)
pH, UA: 6 (ref 5.0–7.5)

## 2023-04-17 LAB — MICROSCOPIC EXAMINATION: WBC, UA: >30 /hpf — AB (ref 0–5)

## 2023-04-17 MED ORDER — SULFAMETHOXAZOLE-TRIMETHOPRIM 800-160 MG PO TABS
1.0000 | ORAL_TABLET | Freq: Two times a day (BID) | ORAL | 0 refills | Status: DC
Start: 2023-04-17 — End: 2023-10-10

## 2023-04-21 ENCOUNTER — Other Ambulatory Visit: Payer: Self-pay | Admitting: Urology

## 2023-04-21 DIAGNOSIS — N390 Urinary tract infection, site not specified: Secondary | ICD-10-CM

## 2023-04-21 LAB — CULTURE, URINE COMPREHENSIVE

## 2023-04-21 MED ORDER — TRIMETHOPRIM 100 MG PO TABS: 100.0000 mg | ORAL_TABLET | Freq: Every day | ORAL | 0 refills | Status: DC

## 2023-04-23 ENCOUNTER — Telehealth: Payer: Self-pay | Admitting: Urology

## 2023-04-23 NOTE — Telephone Encounter (Signed)
Would you call them and read my message and get them scheduled for a follow-up appointment in 3 months?  Mr. Melvin Gilmore, Your urine culture was positive for infection and the antibiotic Septra that I prescribed is the appropriate antibiotic.  When you complete the Septra antibiotic, I want you to start to take trimethoprim 100 mg daily for the next 3 months to see if we can prevent any further infections from occurring.  I have sent that prescription to [facility].  I would then like to see you back in 3 months in the office.  Please call the office [phone]) or request an appointment through MyChart.   Natalina Wieting, PA-C

## 2023-04-24 ENCOUNTER — Other Ambulatory Visit: Payer: Self-pay | Admitting: Physician Assistant

## 2023-04-24 NOTE — Telephone Encounter (Signed)
Seen by patient Melvin Gilmore on 04/23/2023  3:46 PM   Pt read message.

## 2023-04-28 ENCOUNTER — Inpatient Hospital Stay: Payer: Medicare HMO

## 2023-04-28 ENCOUNTER — Inpatient Hospital Stay: Payer: Medicare HMO | Attending: Internal Medicine

## 2023-04-28 ENCOUNTER — Ambulatory Visit: Payer: Medicare HMO

## 2023-04-28 ENCOUNTER — Ambulatory Visit: Payer: Medicare HMO | Admitting: Internal Medicine

## 2023-04-28 VITALS — BP 110/57 | HR 67 | Temp 97.0°F | Resp 18 | Wt 130.0 lb

## 2023-04-28 DIAGNOSIS — Z5112 Encounter for antineoplastic immunotherapy: Secondary | ICD-10-CM | POA: Diagnosis present

## 2023-04-28 DIAGNOSIS — C9002 Multiple myeloma in relapse: Secondary | ICD-10-CM | POA: Diagnosis present

## 2023-04-28 DIAGNOSIS — C9000 Multiple myeloma not having achieved remission: Secondary | ICD-10-CM

## 2023-04-28 LAB — CBC WITH DIFFERENTIAL (CANCER CENTER ONLY)
Abs Immature Granulocytes: 0.01 10*3/uL (ref 0.00–0.07)
Basophils Absolute: 0 10*3/uL (ref 0.0–0.1)
Basophils Relative: 0 %
Eosinophils Absolute: 0 10*3/uL (ref 0.0–0.5)
Eosinophils Relative: 1 %
HCT: 37.9 % — ABNORMAL LOW (ref 39.0–52.0)
Hemoglobin: 13.1 g/dL (ref 13.0–17.0)
Immature Granulocytes: 0 %
Lymphocytes Relative: 37 %
Lymphs Abs: 1.8 10*3/uL (ref 0.7–4.0)
MCH: 33.2 pg (ref 26.0–34.0)
MCHC: 34.6 g/dL (ref 30.0–36.0)
MCV: 96.2 fL (ref 80.0–100.0)
Monocytes Absolute: 0.4 10*3/uL (ref 0.1–1.0)
Monocytes Relative: 8 %
Neutro Abs: 2.6 10*3/uL (ref 1.7–7.7)
Neutrophils Relative %: 54 %
Platelet Count: 177 10*3/uL (ref 150–400)
RBC: 3.94 MIL/uL — ABNORMAL LOW (ref 4.22–5.81)
RDW: 14.1 % (ref 11.5–15.5)
WBC Count: 4.8 10*3/uL (ref 4.0–10.5)
nRBC: 0 % (ref 0.0–0.2)

## 2023-04-28 MED ORDER — ACETAMINOPHEN 325 MG PO TABS: 650.0000 mg | ORAL_TABLET | Freq: Once | ORAL | Status: DC

## 2023-04-28 MED ORDER — DIPHENHYDRAMINE HCL 25 MG PO CAPS: 50.0000 mg | ORAL_CAPSULE | Freq: Once | ORAL | Status: DC

## 2023-04-28 MED ORDER — DARATUMUMAB-HYALURONIDASE-FIHJ 1800-30000 MG-UT/15ML ~~LOC~~ SOLN
1800.0000 mg | Freq: Once | SUBCUTANEOUS | Status: AC
  Administered 2023-04-28: 1800 mg via SUBCUTANEOUS
  Filled 2023-04-28: qty 15

## 2023-04-30 ENCOUNTER — Other Ambulatory Visit: Payer: Self-pay | Admitting: Internal Medicine

## 2023-04-30 ENCOUNTER — Encounter: Payer: Self-pay | Admitting: Internal Medicine

## 2023-05-01 ENCOUNTER — Encounter: Payer: Self-pay | Admitting: Internal Medicine

## 2023-05-12 ENCOUNTER — Inpatient Hospital Stay: Payer: Medicare HMO

## 2023-05-12 ENCOUNTER — Inpatient Hospital Stay: Payer: Medicare HMO | Admitting: Internal Medicine

## 2023-05-12 VITALS — BP 110/70 | HR 73 | Temp 98.8°F | Wt 133.0 lb

## 2023-05-12 DIAGNOSIS — Z5112 Encounter for antineoplastic immunotherapy: Secondary | ICD-10-CM | POA: Diagnosis not present

## 2023-05-12 DIAGNOSIS — C9000 Multiple myeloma not having achieved remission: Secondary | ICD-10-CM

## 2023-05-12 DIAGNOSIS — G893 Neoplasm related pain (acute) (chronic): Secondary | ICD-10-CM | POA: Diagnosis not present

## 2023-05-12 LAB — CBC WITH DIFFERENTIAL (CANCER CENTER ONLY)
Abs Immature Granulocytes: 0.01 10*3/uL (ref 0.00–0.07)
Basophils Absolute: 0 10*3/uL (ref 0.0–0.1)
Basophils Relative: 0 %
Eosinophils Absolute: 0 10*3/uL (ref 0.0–0.5)
Eosinophils Relative: 1 %
HCT: 37.8 % — ABNORMAL LOW (ref 39.0–52.0)
Hemoglobin: 13 g/dL (ref 13.0–17.0)
Immature Granulocytes: 0 %
Lymphocytes Relative: 38 %
Lymphs Abs: 1.6 10*3/uL (ref 0.7–4.0)
MCH: 32.9 pg (ref 26.0–34.0)
MCHC: 34.4 g/dL (ref 30.0–36.0)
MCV: 95.7 fL (ref 80.0–100.0)
Monocytes Absolute: 0.4 10*3/uL (ref 0.1–1.0)
Monocytes Relative: 10 %
Neutro Abs: 2.1 10*3/uL (ref 1.7–7.7)
Neutrophils Relative %: 51 %
Platelet Count: 178 10*3/uL (ref 150–400)
RBC: 3.95 MIL/uL — ABNORMAL LOW (ref 4.22–5.81)
RDW: 14.5 % (ref 11.5–15.5)
WBC Count: 4.2 10*3/uL (ref 4.0–10.5)
nRBC: 0 % (ref 0.0–0.2)

## 2023-05-12 LAB — COMPREHENSIVE METABOLIC PANEL
ALT: 21 U/L (ref 0–44)
AST: 27 U/L (ref 15–41)
Albumin: 4.3 g/dL (ref 3.5–5.0)
Alkaline Phosphatase: 86 U/L (ref 38–126)
Anion gap: 8 (ref 5–15)
BUN: 15 mg/dL (ref 8–23)
CO2: 25 mmol/L (ref 22–32)
Calcium: 8.9 mg/dL (ref 8.9–10.3)
Chloride: 95 mmol/L — ABNORMAL LOW (ref 98–111)
Creatinine, Ser: 1 mg/dL (ref 0.61–1.24)
GFR, Estimated: >60 mL/min (ref >60)
Glucose, Bld: 89 mg/dL (ref 70–99)
Potassium: 4.4 mmol/L (ref 3.5–5.1)
Sodium: 128 mmol/L — ABNORMAL LOW (ref 135–145)
Total Bilirubin: 0.7 mg/dL (ref 0.3–1.2)
Total Protein: 6.7 g/dL (ref 6.5–8.1)

## 2023-05-12 MED ORDER — ACETAMINOPHEN 325 MG PO TABS: 650.0000 mg | ORAL_TABLET | Freq: Once | ORAL | Status: DC

## 2023-05-12 MED ORDER — SODIUM CHLORIDE 0.9 % IV SOLN
Freq: Once | INTRAVENOUS | Status: DC
Start: 2023-05-12 — End: 2023-05-12
  Filled 2023-05-12: qty 250

## 2023-05-12 MED ORDER — DIPHENHYDRAMINE HCL 25 MG PO CAPS
50.0000 mg | ORAL_CAPSULE | Freq: Once | ORAL | Status: DC
Start: 2023-05-12 — End: 2023-05-12

## 2023-05-12 MED ORDER — DARATUMUMAB-HYALURONIDASE-FIHJ 1800-30000 MG-UT/15ML ~~LOC~~ SOLN
1800.0000 mg | Freq: Once | SUBCUTANEOUS | Status: AC
  Administered 2023-05-12: 1800 mg via SUBCUTANEOUS
  Filled 2023-05-12: qty 15

## 2023-05-12 NOTE — Patient Instructions (Signed)
Melvin Gilmore  Discharge Instructions: Thank you for choosing [facility] to provide your oncology and hematology care.  If you have a lab appointment with the [facility], please go directly to the [facility] and check in at the registration area.  Wear comfortable clothing and clothing appropriate for easy access to any Portacath or PICC line.   We strive to give you quality time with your provider. You may need to reschedule your appointment if you arrive late (15 or more minutes).  Arriving late affects you and other patients whose appointments are after yours.  Also, if you miss three or more appointments without notifying the office, you may be dismissed from the clinic at the provider's discretion.      For prescription refill requests, have your pharmacy contact our office and allow 72 hours for refills to be completed.    Today you received the following chemotherapy and/or immunotherapy agents DARZALEX      To help prevent nausea and vomiting after your treatment, we encourage you to take your nausea medication as directed.  BELOW ARE SYMPTOMS THAT SHOULD BE REPORTED IMMEDIATELY: *FEVER GREATER THAN 100.4 F (38 C) OR HIGHER *CHILLS OR SWEATING *NAUSEA AND VOMITING THAT IS NOT CONTROLLED WITH YOUR NAUSEA MEDICATION *UNUSUAL SHORTNESS OF BREATH *UNUSUAL BRUISING OR BLEEDING *URINARY PROBLEMS (pain or burning when urinating, or frequent urination) *BOWEL PROBLEMS (unusual diarrhea, constipation, pain near the anus) TENDERNESS IN MOUTH AND THROAT WITH OR WITHOUT PRESENCE OF ULCERS (sore throat, sores in mouth, or a toothache) UNUSUAL RASH, SWELLING OR PAIN  UNUSUAL VAGINAL DISCHARGE OR ITCHING   Items with * indicate a potential emergency and should be followed up as soon as possible or go to the Emergency Department if any problems should occur.  Please show the CHEMOTHERAPY ALERT CARD or IMMUNOTHERAPY ALERT CARD at check-in to  the Emergency Department and triage nurse.  Should you have questions after your visit or need to cancel or reschedule your appointment, please contact [facility]  [phone] and follow the prompts.  Office hours are 8:00 a.m. to 4:30 p.m. Monday - Friday. Please note that voicemails left after 4:00 p.m. may not be returned until the following business day.  We are closed weekends and major holidays. You have access to a nurse at all times for urgent questions. Please call the main number to the clinic [phone] and follow the prompts.  For any non-urgent questions, you may also contact your provider using MyChart. We now offer e-Visits for anyone 84 and older to request care online for non-urgent symptoms. For details visit [email or web address].   Also download the MyChart app! Go to the app store, search "MyChart", open the app, select [facility], and log in with your MyChart username and password.   Daratumumab Injection What is this medication? DARATUMUMAB (dar a toom ue mab) treats multiple myeloma, a type of bone marrow cancer. It works by helping your immune system slow or stop the spread of cancer cells. It is a monoclonal antibody. This medicine may be used for other purposes; ask your health care provider or pharmacist if you have questions. COMMON BRAND NAME(S): DARZALEX What should I tell my care team before I take this medication? They need to know if you have any of these conditions: Hereditary fructose intolerance Infection, such as chickenpox, herpes, hepatitis B Lung or breathing disease, such as asthma, COPD An unusual or allergic reaction to daratumumab, sorbitol,  other medications, foods, dyes, or preservatives Pregnant or trying to get pregnant Breastfeeding How should I use this medication? This medication is injected into a vein. It is given by your care team in a hospital or clinic setting. Talk to your care team about the  use of this medication in children. Special care may be needed. Overdosage: If you think you have taken too much of this medicine contact a poison control center or emergency room at once. NOTE: This medicine is only for you. Do not share this medicine with others. What if I miss a dose? Keep appointments for follow-up doses. It is important not to miss your dose. Call your care team if you are unable to keep an appointment. What may interact with this medication? Interactions have not been studied. This list may not describe all possible interactions. Give your health care provider a list of all the medicines, herbs, non-prescription drugs, or dietary supplements you use. Also tell them if you smoke, drink alcohol, or use illegal drugs. Some items may interact with your medicine. What should I watch for while using this medication? Your condition will be monitored carefully while you are receiving this medication. This medication can cause serious allergic reactions. To reduce your risk, your care team may give you other medication to take before receiving this one. Be sure to follow the directions from your care team. This medication can affect the results of blood tests to match your blood type. These changes can last for up to 6 months after the final dose. Your care team will do blood tests to match your blood type before you start treatment. Tell all of your care team that you are being treated with this medication before receiving a blood transfusion. This medication can affect the results of some tests used to determine treatment response; extra tests may be needed to evaluate response. Talk to your care team if you wish to become pregnant or think you are pregnant. This medication can cause serious birth defects if taken during pregnancy and for 3 months after the last dose. A reliable form of contraception is recommended while taking this medication and for 3 months after the last dose. Talk  to your care team about effective forms of contraception. Do not breast-feed while taking this medication. What side effects may I notice from receiving this medication? Side effects that you should report to your care team as soon as possible: Allergic reactions--skin rash, itching, hives, swelling of the face, lips, tongue, or throat Infection--fever, chills, cough, sore throat, wounds that don't heal, pain or trouble when passing urine, general feeling of discomfort or being unwell Infusion reactions--chest pain, shortness of breath or trouble breathing, feeling faint or lightheaded Unusual bruising or bleeding Side effects that usually do not require medical attention (report to your care team if they continue or are bothersome): Constipation Diarrhea Fatigue Nausea Pain, tingling, or numbness in the hands or feet Swelling of the ankles, hands, or feet This list may not describe all possible side effects. Call your doctor for medical advice about side effects. You may report side effects to FDA at 1-800-FDA-1088. Where should I keep my medication? This medication is given in a hospital or clinic. It will not be stored at home. NOTE: This sheet is a summary. It may not cover all possible information. If you have questions about this medicine, talk to your doctor, pharmacist, or health care provider.  2024 Elsevier/Gold Standard (2022-05-16 00:00:00)

## 2023-05-12 NOTE — Progress Notes (Unsigned)
Patient went to the dentist and they said he had no cavities and everything looked good. He is still having some back pain. He doesn't like to take the morphine because of the side effects. I told them I can get the dentist to fax [place] over a clearance form.

## 2023-05-13 LAB — KAPPA/LAMBDA LIGHT CHAINS
Kappa free light chain: 48.8 mg/L — ABNORMAL HIGH (ref 3.3–19.4)
Kappa, lambda light chain ratio: 9.04 — ABNORMAL HIGH (ref 0.26–1.65)
Lambda free light chains: 5.4 mg/L — ABNORMAL LOW (ref 5.7–26.3)

## 2023-05-14 ENCOUNTER — Encounter: Payer: Self-pay | Admitting: Internal Medicine

## 2023-05-15 ENCOUNTER — Encounter: Payer: Self-pay | Admitting: Internal Medicine

## 2023-05-15 LAB — MULTIPLE MYELOMA PANEL, SERUM
Albumin SerPl Elph-Mcnc: 3.7 g/dL (ref 2.9–4.4)
Albumin/Glob SerPl: 1.5 (ref 0.7–1.7)
Alpha 1: 0.3 g/dL (ref 0.0–0.4)
Alpha2 Glob SerPl Elph-Mcnc: 0.7 g/dL (ref 0.4–1.0)
B-Globulin SerPl Elph-Mcnc: 0.9 g/dL (ref 0.7–1.3)
Gamma Glob SerPl Elph-Mcnc: 0.7 g/dL (ref 0.4–1.8)
Globulin, Total: 2.5 g/dL (ref 2.2–3.9)
IgA: 211 mg/dL (ref 61–437)
IgG (Immunoglobin G), Serum: 576 mg/dL — ABNORMAL LOW (ref 603–1613)
IgM (Immunoglobulin M), Srm: 8 mg/dL — ABNORMAL LOW (ref 15–143)
M Protein SerPl Elph-Mcnc: 0.2 g/dL — ABNORMAL HIGH
Total Protein ELP: 6.2 g/dL (ref 6.0–8.5)

## 2023-05-15 NOTE — Progress Notes (Signed)
[facility] CONSULT NOTE  Patient Care Team: [name], MD as PCP - General (Internal Medicine) [name], MD as Consulting Physician (Oncology)  CANCER STAGING   Cancer Staging  Multiple myeloma [facility]) Staging form: Plasma Cell Myeloma and Plasma Cell Disorders, AJCC 8th Edition - Clinical stage from 12/05/2022: RISS Stage II (Beta-2-microglobulin (mg/L): 3.7, Albumin (g/dL): 3.6, ISS: Stage II, High-risk cytogenetics: Absent, LDH: Normal) - Signed by [name], MD on 12/26/2022 Stage prefix: Initial diagnosis Beta 2 microglobulin range (mg/L): 3.5 to 5.49 Albumin range (g/dL): Greater than or equal to 3.5 Cytogenetics: 1q addition, Other mutation   ASSESSMENT & PLAN:  Melvin Gilmore 82 y.o. male with pmh of hypertension, hyperlipidemia, anxiety, GERD, BPH, hypothyroidism, prostate cancer s/p HIFU was seen by primary on November 13, 2022 for acute onset of lower back pain.  Was referred to medical oncology for finding of L1 lesion suspicious for metastasis.  # Multiple myeloma, RISS Stage II -MRI thoracic and lumbar spine with and without contrast was reviewed. Showed unchanged subacute compression fracture at T12.  Focal enhancing marrow replacing lesions in T6, T8, T5, T2 all suspicious for metastatic disease.  L1 lesion measuring 16 mm.  Multilevel degenerative changes present.  -SPEP/IFE showed biclonal IgA kappa paraprotein with M spike 2.1 g/dL and second spike at 0.4 g/dL.  Kappa 453, lambda 6.8 with a ratio of 66.7.  Iron panel and B12 are normal.  PSA 3.44.  CBC showed mild anemia 13.4, chronic.  On CMP normal creatinine and calcium levels.  Elevated total protein 8.8.  LDH normal.  Beta-2 microglobulin 3.7.  Albumin 3.6.  24-hour urine/UPEP showed 1.1 g protein with no M spike.  -BMBx Hypercellular marrow with 60% plasma cells.  IHC positive for CD138/ mum 1.  Kappa restricted consistent with multiple myeloma. L1 vertebral body biopsy showed numerous  plasma cells. Cytogenetics - del (13q) and gain 1.    -PET/CT showed activity in L1 and left eighth rib concerning for myeloma.  -Started on Dara-RD on 12/26/2022.  Completed cycle 1 of daratumumab ->hospitalized for urinary retention, constipation and back pain -> inpatient hospitalist transition him to hospice -> patient revoked hospice on 01/31/2023 since he was feeling better.  Multiple treatment interruptions due to UTIs and hospitalization for stroke.  -Labs reviewed and acceptable for treatment.  Will proceed with cycle 4-day 1 of daratumumab today.  Multiple myeloma panel is pending.   -Patient is not able to tolerate even low doses of Decadron 4 mg on day 1 and day 2 due to intractable hiccups.  He would like to stop it.   -Revlimid 2.5 mg 3-week on 1 week off on hold.  Plan to resume Revlimid 3 months from the stroke.  (stroke is very less likely to be related to Revlimid since he was not taking it at the time of the event.).  Unable to tolerate 5 mg and 10 mg dose due to neutropenia.   # Recurrent UTIs -Follows with Dr. Lonna Cobb.  On prophylactic antibiotic.  # Stroke - Admitted 03/07/2023 with acute onset aphasia and right hemianopia. s/p tPA with complete resolution of symptoms.  MRI brain negative.  Echo unremarkable.  On aspirin and Plavix for 21 days then aspirin alone.  On Lipitor 40 mg daily.    -Holding Revlimid currently.  Plan to resume it next month.  # Intractable hiccups -Secondary to dexamethasone. -He is not able to tolerate small dose 4 mg on day 1 and day 2.  Has stopped it.  # Vertebral compression fractures -Will reach out to the dentist for the clearance.  Plan to start on Zometa IV 3 mg based on the creatinine clearance next time.  # T12 pathological fracture # Cancer-related pain - s/p kyphoplasty by Dr. Elijio Miles on 12/06/22.  No improvement in pain -Completed palliative RT on 01/20/2023.  -Reports significant improvement in pain.  Completely off opioids now.   Reports pain in the back while straightening up.  Does not want to do low-dose of morphine.  He is interested in IR evaluation for role for further kyphoplasty.  Will reach out to Dr. Juliette Alcide for reevaluation.  # Constipation -Manageable with senna, MiraLAX, lactulose, Movantik as needed.  # History of prostate cancer - follows with Dr. Evelene Croon, urology for elevated PSA.  MRI prostate showed 1 mm category 5 lesion of anterior transition zone.  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 s/p HIFU of the prostate on 02/07/2022.  PSA level from 10/08/2022 was 3.34.  -PSA from 11/21/2022 3.44.  # Poor appetite # Weight loss -Could be from cancer and uncontrolled pain.  Pain management as above. Nutrition following. -Appetite has improved.  Continue with Marinol 2.5 mg once daily.   # Anxiety -He is taking Ambien 5 mg nightly prescribed by PCP.  Orders Placed This Encounter  Procedures   CBC with Differential (Cancer Center Only)    Standing Status:   Future    Standing Expiration Date:   05/25/2024   CBC with Differential (Cancer Center Only)    Standing Status:   Future    Standing Expiration Date:   06/08/2024   RTC in 2 weeks for labs, cycle 4-day 15 of daratumumab RTC in 4 weeks for MD visit, labs, cycle 5-day 1 of Dara  The total time spent in the appointment was 30 minutes encounter with patients including review of chart and various tests results, discussions about plan of care and coordination of care plan   All questions were answered. The patient knows to call the clinic with any problems, questions or concerns. No barriers to learning was detected.  [name], MD 10/24/20241:56 PM   HISTORY OF PRESENTING ILLNESS:  Melvin Gilmore 82 y.o. male with past medical history of hypertension, hyperlipidemia, anxiety, GERD, BPH, hypothyroidism, prostate cancer s/p HIFU was seen by primary on November 13, 2022 for acute onset of  lower back pain.  Patient reports that he was sitting outside and had violent sneeze which was followed by severe back pain.  He has fallen allergies.  Had x-rays followed by MRI thoracic spine without contrast.  It showed compression fracture of T12 superior endplate with surrounding marrow edema likely subacute.  15 mm T1 hypointense marrow lesion in the anterior aspect of L1 suspicious for metastasis.  Further characterization with postcontrast MRI recommended.  Prostate cancer-follows with Dr. Evelene Croon, urology for elevated PSA.  MRI prostate showed 1 mm category 5 lesion of anterior transition zone.  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 s/p HIFU of the prostate on 02/07/2022.  PSA level from 10/08/2022 was 3.34.  Interval history Patient was seen today accompanied with wife for labs and daratumumab. Patient is doing well overall.  He reports pain in his back when he straightens it.  Otherwise he does not feel the pain.  Does not like to take morphine.  He is interested in evaluation with IR again to see further role  for kyphoplasty.  Appetite has been good overall.  Now on prophylactic antibiotics for his UTI.  I have reviewed his chart and materials related to his cancer extensively and collaborated history with the patient. Summary of oncologic history is as follows: Oncology History  Multiple myeloma (HCC)  12/05/2022 Cancer Staging   Staging form: Plasma Cell Myeloma and Plasma Cell Disorders, AJCC 8th Edition - Clinical stage from 12/05/2022: RISS Stage II (Beta-2-microglobulin (mg/L): 3.7, Albumin (g/dL): 3.6, ISS: Stage II, High-risk cytogenetics: Absent, LDH: Normal) - Signed by [name], MD on 12/26/2022 Stage prefix: Initial diagnosis Beta 2 microglobulin range (mg/L): 3.5 to 5.49 Albumin range (g/dL): Greater than or equal to 3.5 Cytogenetics: 1q addition, Other mutation   12/13/2022 Initial Diagnosis   Multiple myeloma  (HCC)   12/26/2022 - 01/24/2023 Chemotherapy   Patient is on Treatment Plan : MYELOMA NEWLY DIAGNOSED Daratumumab IV + Lenalidomide + Dexamethasone Weekly (DaraRd) q28d     02/07/2023 - 02/07/2023 Chemotherapy   Patient is on Treatment Plan : MYELOMA NON-TRANSPLANT CANDIDATES VRd weekly q21d     02/13/2023 -  Chemotherapy   Patient is on Treatment Plan : MYELOMA NEWLY DIAGNOSED Daratumumab IV + Lenalidomide + Dexamethasone Weekly (DaraRd) q28d       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   Nausea vomiting and diarrhea 01/26/2023   Prostate cancer (HCC) 2023   Seasonal allergies    Skin cancer    a.) ears   Wears partial dentures    lower    SURGICAL HISTORY: Past Surgical History:  Procedure Laterality Date   APPENDECTOMY     CATARACT EXTRACTION W/PHACO Right 01/15/2016   Procedure: CATARACT EXTRACTION PHACO AND INTRAOCULAR LENS PLACEMENT (IOC);  Surgeon: Sherald Hess, MD;  Location: [facility];  Service: Ophthalmology;  Laterality: Right;  RIGHT CALL CELL PHONE WITH TIME   CHOLECYSTECTOMY     COLONOSCOPY     COLONOSCOPY WITH ESOPHAGOGASTRODUODENOSCOPY (EGD)     ESOPHAGOGASTRODUODENOSCOPY  07/2021   ESOPHAGOGASTRODUODENOSCOPY (EGD) WITH PROPOFOL N/A 09/23/2017   Procedure: ESOPHAGOGASTRODUODENOSCOPY (EGD) WITH PROPOFOL;  Surgeon: Toledo, Boykin Nearing, MD;  Location: [facility];  Service: Gastroenterology;  Laterality: N/A;   HIGH INTENSITY FOCUSED ULTRASOUND (HIFU) OF THE PROSTATE N/A 02/07/2022   Procedure:  HIGH INTENSITY FOCUSED ULTRASOUND (HIFU) OF THE PROSTATE;  Surgeon: Orson Ape, MD;  Location: [facility];  Service: Urology;  Laterality: N/A;   HYDROCELE EXCISION / REPAIR     IR KYPHO THORACIC WITH BONE BIOPSY  12/06/2022   IR RADIOLOGIST EVAL & MGMT  11/21/2022   LASIX RT EYE     PROSTATE BIOPSY N/A 01/03/2022   Procedure: PROSTATE BIOPSY  Addison Blackwelder;  Surgeon: Orson Ape, MD;  Location: [facility];  Service: Urology;  Laterality: N/A;   ROTATOR CUFF REPAIR Left 2001    SOCIAL HISTORY: Social History   Socioeconomic History   Marital status: Married    Spouse name: Not on  file   Number of children: Not on file   Years of education: Not on file   Highest education level: Not on file  Occupational History   Not on file  Tobacco Use   Smoking status: Never   Smokeless tobacco: Never  Vaping Use   Vaping status: Never Used  Substance and Sexual Activity   Alcohol use: Yes    Alcohol/week: 7.0 standard drinks of alcohol    Types: 7 Glasses of wine per week    Comment: wine occ   Drug use: No   Sexual activity: Not on file  Other Topics Concern   Not on file  Social History Narrative   Not on file   Social Determinants of Health   Financial Resource Strain: Low Risk  (11/21/2022)   Overall Financial Resource Strain (CARDIA)    Difficulty of Paying Living Expenses: Not hard at all  Food Insecurity: Patient Unable To Answer (03/07/2023)   Hunger Vital Sign    Worried About Running Out of Food in the Last Year: Patient unable to answer    Ran Out of Food in the Last Year: Patient unable to answer  Transportation Needs: Patient Unable To Answer (03/07/2023)   PRAPARE - Transportation    Lack of Transportation (Medical): Patient unable to answer    Lack of Transportation (Non-Medical): Patient unable to answer  Physical Activity: Not on file  Stress: Not on file  Social Connections: Not on file  Intimate Partner Violence: Patient Unable To Answer (03/07/2023)   Humiliation,  Afraid, Rape, and Kick questionnaire    Fear of Current or Ex-Partner: Patient unable to answer    Emotionally Abused: Patient unable to answer    Physically Abused: Patient unable to answer    Sexually Abused: Patient unable to answer    FAMILY HISTORY: Family History  Problem Relation Age of Onset   Pneumonia Father     ALLERGIES:  is allergic to ciprofloxacin, amoxicillin-pot clavulanate, and meloxicam.  MEDICATIONS:  Current Outpatient Medications  Medication Sig Dispense Refill   acetaminophen (TYLENOL) 500 MG tablet Take 500 mg by mouth every 6 (six) hours as needed for mild pain, moderate pain, fever or headache.     acyclovir (ZOVIRAX) 400 MG tablet Take 400 mg by mouth 2 (two) times daily.     ALPRAZolam (XANAX) 0.25 MG tablet Take 1 tablet (0.25 mg total) by mouth at bedtime as needed for anxiety. 30 tablet 0   levothyroxine (SYNTHROID, LEVOTHROID) 50 MCG tablet Take 50 mcg by mouth daily before breakfast.     pantoprazole (PROTONIX) 40 MG tablet Take 40 mg by mouth daily as needed.     Probiotic Product (PROBIOTIC ADVANCED PO) Take 1 tablet by mouth at bedtime.     senna (SENOKOT) 8.6 MG tablet Take 2 tablets (17.2 mg total) by mouth 2 (two) times daily. May crush, mix with water and give sublingually if needed. 28 tablet 0   sulfamethoxazole-trimethoprim (BACTRIM DS) 800-160 MG tablet Take 1 tablet by mouth every 12 (twelve) hours. 60 tablet 0   trimethoprim (TRIMPEX) 100 MG tablet Take 1 tablet (100 mg total) by mouth daily. 90 tablet 0   zolpidem (AMBIEN) 5 MG tablet TAKE 1 TABLET BY MOUTH AT BEDTIME AS NEEDED FOR SLEEP. 30 tablet 0   atorvastatin (LIPITOR) 40 MG tablet Take 1 tablet (40 mg total) by mouth daily. (Patient not taking: Reported on 04/17/2023) 30 tablet 2   No current facility-administered medications for this visit.  REVIEW OF SYSTEMS:   Pertinent information mentioned in HPI All other systems were reviewed with the patient and are  negative.  PHYSICAL EXAMINATION: ECOG PERFORMANCE STATUS: 1 - Symptomatic but completely ambulatory  Vitals:   05/12/23 1310  BP: 110/70  Pulse: 73  Temp: 98.8 F (37.1 C)  SpO2: 99%      Filed Weights   05/12/23 1310  Weight: 133 lb (60.3 kg)       GENERAL:alert, no distress and comfortable SKIN: skin color, texture, turgor are normal, no rashes or significant lesions EYES: normal, conjunctiva are pink and non-injected, sclera clear OROPHARYNX:no exudate, no erythema and lips, buccal mucosa, and tongue normal  NECK: supple, thyroid normal size, non-tender, without nodularity LYMPH:  no palpable lymphadenopathy in the cervical, axillary or inguinal LUNGS: clear to auscultation and percussion with normal breathing effort HEART: regular rate & rhythm and no murmurs and no lower extremity edema ABDOMEN:abdomen soft, non-tender and normal bowel sounds Musculoskeletal:no cyanosis of digits and no clubbing  PSYCH: alert & oriented x 3 with fluent speech NEURO: no focal motor/sensory deficits  LABORATORY DATA:  I have reviewed the data as listed Lab Results  Component Value Date   WBC 4.2 05/12/2023   HGB 13.0 05/12/2023   HCT 37.8 (L) 05/12/2023   MCV 95.7 05/12/2023   PLT 178 05/12/2023   Recent Labs    04/07/23 0906 04/14/23 0830 05/12/23 1242  NA 129* 128* 128*  K 3.8 3.9 4.4  CL 96* 94* 95*  CO2 25 25 25   GLUCOSE 74 130* 89  BUN 15 16 15   CREATININE 0.90 0.91 1.00  CALCIUM 9.1 9.0 8.9  GFRNONAA >60 >60 >60  PROT 6.6 6.2* 6.7  ALBUMIN 4.0 3.8 4.3  AST 25 36 27  ALT 22 27 21   ALKPHOS 88 83 86  BILITOT 0.5 0.7 0.7    RADIOGRAPHIC STUDIES: I have personally reviewed the radiological images as listed and agreed with the findings in the report. CT CARDIAC SCORING (SELF PAY ONLY)  Addendum Date: 05/06/2023   ADDENDUM REPORT: 05/06/2023 16:55 EXAM: OVER-READ INTERPRETATION  CT CHEST The following report is an over-read performed by radiologist Dr.  [place] [facility] on 05/06/2023. This over-read does not include interpretation of cardiac or coronary anatomy or pathology. The coronary calcium score interpretation by the cardiologist is attached. COMPARISON:  02/04/2022 FINDINGS: Cardiovascular: Ectatic ascending thoracic aorta, 3.8 cm. Calcification of the aorta and coronary arteries. See findings discussed in the body of the report. Mediastinum/Nodes: No suspicious adenopathy identified. Imaged mediastinal structures are unremarkable. Lungs/Pleura: Changes consistent with emphysematous disease and peripheral interstitial pulmonary fibrosis. Upper Abdomen: Left lobe hepatic cyst measures 1.9 cm. Small hiatal hernia. No acute abnormality. Musculoskeletal: No chest wall abnormality. No acute osseous findings. Severe thoracic degenerative changes with multilevel vacuum disc changes. Lower thoracic vertebroplasty. IMPRESSION: 1. Ectatic ascending aorta. 2. Pulmonary interstitial fibrosis and emphysematous disease. 3. Hepatic cyst. 4. Small hiatal hernia. 5. Degenerative disc disease and previous vertebroplasty. Electronically Signed   By: Layla Maw M.D.   On: 05/06/2023 16:55   Result Date: 05/06/2023 CLINICAL DATA:  Risk stratification EXAM: Coronary Calcium Score TECHNIQUE: The patient was scanned on a Siemens Somatom scanner. Axial non-contrast 3 mm slices were carried out through the heart. The data set was analyzed on a dedicated work station and scored using the Agatson method. FINDINGS: Non-cardiac: See separate report from [facility]. Ascending Aorta: Normal size Pericardium: Normal Coronary arteries: Normal origin of left and right  coronary arteries. Distribution of arterial calcifications if present, as noted below; LM 0 LAD 70.1 LCx 0 RCA 0 Total 70.1 IMPRESSION AND RECOMMENDATION: 1. Coronary calcium score of 70.1. This was 13th percentile for age and sex matched control. 2. CAC 1-99 in LAD. CAC-DRS A1/N1. 3.  Continue heart healthy lifestyle and risk factor modification. Electronically Signed: By: Debbe Odea M.D. On: 04/16/2023 15:52

## 2023-05-26 ENCOUNTER — Inpatient Hospital Stay: Payer: Medicare HMO

## 2023-05-26 ENCOUNTER — Inpatient Hospital Stay: Payer: Medicare HMO | Attending: Internal Medicine

## 2023-05-26 VITALS — BP 115/59 | HR 67 | Temp 98.0°F | Resp 16

## 2023-05-26 DIAGNOSIS — R338 Other retention of urine: Secondary | ICD-10-CM | POA: Insufficient documentation

## 2023-05-26 DIAGNOSIS — D649 Anemia, unspecified: Secondary | ICD-10-CM | POA: Diagnosis not present

## 2023-05-26 DIAGNOSIS — Z87440 Personal history of urinary (tract) infections: Secondary | ICD-10-CM | POA: Insufficient documentation

## 2023-05-26 DIAGNOSIS — C61 Malignant neoplasm of prostate: Secondary | ICD-10-CM | POA: Diagnosis not present

## 2023-05-26 DIAGNOSIS — K219 Gastro-esophageal reflux disease without esophagitis: Secondary | ICD-10-CM | POA: Insufficient documentation

## 2023-05-26 DIAGNOSIS — E039 Hypothyroidism, unspecified: Secondary | ICD-10-CM | POA: Insufficient documentation

## 2023-05-26 DIAGNOSIS — N400 Enlarged prostate without lower urinary tract symptoms: Secondary | ICD-10-CM | POA: Insufficient documentation

## 2023-05-26 DIAGNOSIS — C9000 Multiple myeloma not having achieved remission: Secondary | ICD-10-CM | POA: Diagnosis not present

## 2023-05-26 DIAGNOSIS — I10 Essential (primary) hypertension: Secondary | ICD-10-CM | POA: Insufficient documentation

## 2023-05-26 DIAGNOSIS — E785 Hyperlipidemia, unspecified: Secondary | ICD-10-CM | POA: Diagnosis not present

## 2023-05-26 DIAGNOSIS — F419 Anxiety disorder, unspecified: Secondary | ICD-10-CM | POA: Insufficient documentation

## 2023-05-26 DIAGNOSIS — K5900 Constipation, unspecified: Secondary | ICD-10-CM | POA: Insufficient documentation

## 2023-05-26 DIAGNOSIS — N401 Enlarged prostate with lower urinary tract symptoms: Secondary | ICD-10-CM | POA: Diagnosis not present

## 2023-05-26 DIAGNOSIS — Z5112 Encounter for antineoplastic immunotherapy: Secondary | ICD-10-CM | POA: Diagnosis present

## 2023-05-26 DIAGNOSIS — C9002 Multiple myeloma in relapse: Secondary | ICD-10-CM | POA: Diagnosis present

## 2023-05-26 LAB — CBC WITH DIFFERENTIAL (CANCER CENTER ONLY)
Abs Immature Granulocytes: 0.01 10*3/uL (ref 0.00–0.07)
Basophils Absolute: 0 10*3/uL (ref 0.0–0.1)
Basophils Relative: 0 %
Eosinophils Absolute: 0.1 10*3/uL (ref 0.0–0.5)
Eosinophils Relative: 1 %
HCT: 36.9 % — ABNORMAL LOW (ref 39.0–52.0)
Hemoglobin: 12.7 g/dL — ABNORMAL LOW (ref 13.0–17.0)
Immature Granulocytes: 0 %
Lymphocytes Relative: 32 %
Lymphs Abs: 1.4 10*3/uL (ref 0.7–4.0)
MCH: 33 pg (ref 26.0–34.0)
MCHC: 34.4 g/dL (ref 30.0–36.0)
MCV: 95.8 fL (ref 80.0–100.0)
Monocytes Absolute: 0.5 10*3/uL (ref 0.1–1.0)
Monocytes Relative: 11 %
Neutro Abs: 2.5 10*3/uL (ref 1.7–7.7)
Neutrophils Relative %: 56 %
Platelet Count: 166 10*3/uL (ref 150–400)
RBC: 3.85 MIL/uL — ABNORMAL LOW (ref 4.22–5.81)
RDW: 14.6 % (ref 11.5–15.5)
WBC Count: 4.5 10*3/uL (ref 4.0–10.5)
nRBC: 0 % (ref 0.0–0.2)

## 2023-05-26 MED ORDER — DARATUMUMAB-HYALURONIDASE-FIHJ 1800-30000 MG-UT/15ML ~~LOC~~ SOLN
1800.0000 mg | Freq: Once | SUBCUTANEOUS | Status: AC
  Administered 2023-05-26: 1800 mg via SUBCUTANEOUS
  Filled 2023-05-26: qty 15

## 2023-05-26 MED ORDER — DIPHENHYDRAMINE HCL 25 MG PO CAPS: 50.0000 mg | ORAL_CAPSULE | Freq: Once | ORAL | Status: DC

## 2023-05-26 MED ORDER — ACETAMINOPHEN 325 MG PO TABS
650.0000 mg | ORAL_TABLET | Freq: Once | ORAL | Status: DC
Start: 2023-05-26 — End: 2023-05-26

## 2023-05-26 NOTE — Patient Instructions (Signed)
[facility]  Discharge Instructions: Thank you for choosing [facility] to provide your oncology and hematology care.  If you have a lab appointment with the [facility], please go directly to the [facility] and check in at the registration area.  Wear comfortable clothing and clothing appropriate for easy access to any Portacath or PICC line.   We strive to give you quality time with your provider. You may need to reschedule your appointment if you arrive late (15 or more minutes).  Arriving late affects you and other patients whose appointments are after yours.  Also, if you miss three or more appointments without notifying the office, you may be dismissed from the clinic at the provider's discretion.      For prescription refill requests, have your pharmacy contact our office and allow 72 hours for refills to be completed.    Today you received the following chemotherapy and/or immunotherapy agents DARZALEX      To help prevent nausea and vomiting after your treatment, we encourage you to take your nausea medication as directed.  BELOW ARE SYMPTOMS THAT SHOULD BE REPORTED IMMEDIATELY: *FEVER GREATER THAN 100.4 F (38 C) OR HIGHER *CHILLS OR SWEATING *NAUSEA AND VOMITING THAT IS NOT CONTROLLED WITH YOUR NAUSEA MEDICATION *UNUSUAL SHORTNESS OF BREATH *UNUSUAL BRUISING OR BLEEDING *URINARY PROBLEMS (pain or burning when urinating, or frequent urination) *BOWEL PROBLEMS (unusual diarrhea, constipation, pain near the anus) TENDERNESS IN MOUTH AND THROAT WITH OR WITHOUT PRESENCE OF ULCERS (sore throat, sores in mouth, or a toothache) UNUSUAL RASH, SWELLING OR PAIN  UNUSUAL VAGINAL DISCHARGE OR ITCHING   Items with * indicate a potential emergency and should be followed up as soon as possible or go to the Emergency Department if any problems should occur.  Please show the CHEMOTHERAPY ALERT CARD or IMMUNOTHERAPY ALERT CARD at check-in to  the Emergency Department and triage nurse.  Should you have questions after your visit or need to cancel or reschedule your appointment, please contact [facility]  [phone] and follow the prompts.  Office hours are 8:00 a.m. to 4:30 p.m. Monday - Friday. Please note that voicemails left after 4:00 p.m. may not be returned until the following business day.  We are closed weekends and major holidays. You have access to a nurse at all times for urgent questions. Please call the main number to the clinic [phone] and follow the prompts.  For any non-urgent questions, you may also contact your provider using MyChart. We now offer e-Visits for anyone 84 and older to request care online for non-urgent symptoms. For details visit [email or web address].   Also download the MyChart app! Go to the app store, search "MyChart", open the app, select [facility], and log in with your MyChart username and password.   Daratumumab Injection What is this medication? DARATUMUMAB (dar a toom ue mab) treats multiple myeloma, a type of bone marrow cancer. It works by helping your immune system slow or stop the spread of cancer cells. It is a monoclonal antibody. This medicine may be used for other purposes; ask your health care provider or pharmacist if you have questions. COMMON BRAND NAME(S): DARZALEX What should I tell my care team before I take this medication? They need to know if you have any of these conditions: Hereditary fructose intolerance Infection, such as chickenpox, herpes, hepatitis B Lung or breathing disease, such as asthma, COPD An unusual or allergic reaction to daratumumab, sorbitol,  other medications, foods, dyes, or preservatives Pregnant or trying to get pregnant Breastfeeding How should I use this medication? This medication is injected into a vein. It is given by your care team in a hospital or clinic setting. Talk to your care team about the  use of this medication in children. Special care may be needed. Overdosage: If you think you have taken too much of this medicine contact a poison control center or emergency room at once. NOTE: This medicine is only for you. Do not share this medicine with others. What if I miss a dose? Keep appointments for follow-up doses. It is important not to miss your dose. Call your care team if you are unable to keep an appointment. What may interact with this medication? Interactions have not been studied. This list may not describe all possible interactions. Give your health care provider a list of all the medicines, herbs, non-prescription drugs, or dietary supplements you use. Also tell them if you smoke, drink alcohol, or use illegal drugs. Some items may interact with your medicine. What should I watch for while using this medication? Your condition will be monitored carefully while you are receiving this medication. This medication can cause serious allergic reactions. To reduce your risk, your care team may give you other medication to take before receiving this one. Be sure to follow the directions from your care team. This medication can affect the results of blood tests to match your blood type. These changes can last for up to 6 months after the final dose. Your care team will do blood tests to match your blood type before you start treatment. Tell all of your care team that you are being treated with this medication before receiving a blood transfusion. This medication can affect the results of some tests used to determine treatment response; extra tests may be needed to evaluate response. Talk to your care team if you wish to become pregnant or think you are pregnant. This medication can cause serious birth defects if taken during pregnancy and for 3 months after the last dose. A reliable form of contraception is recommended while taking this medication and for 3 months after the last dose. Talk  to your care team about effective forms of contraception. Do not breast-feed while taking this medication. What side effects may I notice from receiving this medication? Side effects that you should report to your care team as soon as possible: Allergic reactions--skin rash, itching, hives, swelling of the face, lips, tongue, or throat Infection--fever, chills, cough, sore throat, wounds that don't heal, pain or trouble when passing urine, general feeling of discomfort or being unwell Infusion reactions--chest pain, shortness of breath or trouble breathing, feeling faint or lightheaded Unusual bruising or bleeding Side effects that usually do not require medical attention (report to your care team if they continue or are bothersome): Constipation Diarrhea Fatigue Nausea Pain, tingling, or numbness in the hands or feet Swelling of the ankles, hands, or feet This list may not describe all possible side effects. Call your doctor for medical advice about side effects. You may report side effects to FDA at 1-800-FDA-1088. Where should I keep my medication? This medication is given in a hospital or clinic. It will not be stored at home. NOTE: This sheet is a summary. It may not cover all possible information. If you have questions about this medicine, talk to your doctor, pharmacist, or health care provider.  2024 [employer]/Gold Standard (2022-05-16 00:00:00)

## 2023-05-29 ENCOUNTER — Other Ambulatory Visit: Payer: Self-pay

## 2023-05-29 NOTE — Telephone Encounter (Signed)
NA

## 2023-06-02 ENCOUNTER — Other Ambulatory Visit: Payer: Self-pay | Admitting: Internal Medicine

## 2023-06-04 ENCOUNTER — Encounter: Payer: Self-pay | Admitting: Urology

## 2023-06-04 ENCOUNTER — Ambulatory Visit: Payer: Medicare HMO | Admitting: Urology

## 2023-06-04 VITALS — BP 124/61 | HR 75

## 2023-06-04 DIAGNOSIS — Z8546 Personal history of malignant neoplasm of prostate: Secondary | ICD-10-CM | POA: Diagnosis not present

## 2023-06-04 DIAGNOSIS — Z87898 Personal history of other specified conditions: Secondary | ICD-10-CM | POA: Diagnosis not present

## 2023-06-04 DIAGNOSIS — C61 Malignant neoplasm of prostate: Secondary | ICD-10-CM

## 2023-06-04 DIAGNOSIS — R310 Gross hematuria: Secondary | ICD-10-CM

## 2023-06-04 DIAGNOSIS — R82998 Other abnormal findings in urine: Secondary | ICD-10-CM

## 2023-06-04 DIAGNOSIS — N390 Urinary tract infection, site not specified: Secondary | ICD-10-CM

## 2023-06-04 LAB — MICROSCOPIC EXAMINATION

## 2023-06-04 LAB — URINALYSIS, COMPLETE
Bilirubin, UA: NEGATIVE
Glucose, UA: NEGATIVE
Ketones, UA: NEGATIVE
Nitrite, UA: NEGATIVE
Protein,UA: NEGATIVE
RBC, UA: NEGATIVE
Specific Gravity, UA: 1.015 (ref 1.005–1.030)
Urobilinogen, Ur: 0.2 mg/dL (ref 0.2–1.0)
pH, UA: 6 (ref 5.0–7.5)

## 2023-06-04 LAB — BLADDER SCAN AMB NON-IMAGING: Scan Result: 26

## 2023-06-04 NOTE — Progress Notes (Signed)
06/04/2023 4:23 PM   Melvin Gilmore 1941-03-30 [id number]  Referring provider: [name], MD [address] [facility] [place],  [place] [address]  Urological history: 1.  Prostate cancer -PSA (11/2022) - 3.44 -Previously followed by Dr. 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 Prostate biopsy performed 01/03/22 for a PSA of 8.53 and MRI showing a PI-RADS5 lesion at the left anterior transition zone. Fusion biopsy return Gleason 3+4 adenocarcinoma on the ROI cores. -He underwent HIFU by Dr. Evelene Croon 02/07/22  2.  Urinary retention -secondary to constipation (01/2023)  Chief Complaint  Patient presents with   Follow-up   HPI: Melvin Gilmore is a 82 y.o. male who presents today for blood in the urine with his wife, Melvin Gilmore.    Previous records reviewed.    He had an episode of hematuria yesterday and then it cleared by the afternoon.  He has not had any issues last evening or today.  Patient denies any modifying or aggravating factors.  Patient denies any recent UTI's, gross hematuria, dysuria or suprapubic/flank pain.  Patient denies any fevers, chills, nausea or vomiting.    UA moderate bacteria.   PVR 26 mL   He has also been experiencing lower back pain.   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   Nausea vomiting and diarrhea 01/26/2023   Prostate cancer  (HCC) 2023   Seasonal allergies    Skin cancer    a.) ears   Wears partial dentures    lower    Surgical History: Past Surgical History:  Procedure Laterality Date   APPENDECTOMY     CATARACT EXTRACTION W/PHACO Right 01/15/2016   Procedure: CATARACT EXTRACTION PHACO AND INTRAOCULAR LENS PLACEMENT (IOC);  Surgeon: Sherald Hess, MD;  Location: [facility];  Service: Ophthalmology;  Laterality: Right;  RIGHT CALL CELL PHONE WITH TIME   CHOLECYSTECTOMY     COLONOSCOPY     COLONOSCOPY WITH ESOPHAGOGASTRODUODENOSCOPY (EGD)     ESOPHAGOGASTRODUODENOSCOPY  07/2021   ESOPHAGOGASTRODUODENOSCOPY (EGD) WITH PROPOFOL N/A 09/23/2017   Procedure: ESOPHAGOGASTRODUODENOSCOPY (EGD) WITH PROPOFOL;  Surgeon: Toledo, Boykin Nearing, MD;  Location: [facility];  Service: Gastroenterology;  Laterality: N/A;   HIGH INTENSITY FOCUSED ULTRASOUND (HIFU) OF THE PROSTATE N/A 02/07/2022   Procedure: HIGH INTENSITY FOCUSED ULTRASOUND (HIFU) OF THE PROSTATE;  Surgeon: Orson Ape, MD;  Location: [facility];  Service: Urology;  Laterality: N/A;   HYDROCELE EXCISION / REPAIR     IR KYPHO THORACIC WITH BONE BIOPSY  12/06/2022   IR RADIOLOGIST EVAL & MGMT  11/21/2022   LASIX RT EYE     PROSTATE BIOPSY N/A 01/03/2022   Procedure: PROSTATE BIOPSY  Melvin Gilmore;  Surgeon: Orson Ape, MD;  Location: [facility];  Service: Urology;  Laterality: N/A;  ROTATOR CUFF REPAIR Left 2001    Home Medications:  Allergies as of 06/04/2023       Reactions   Ciprofloxacin Diarrhea   He experienced an elevation in blood pressure, shortness of breath and diarrhea when he took the Cipro.    Amoxicillin-pot Clavulanate Rash, Other (See Comments)   Pt states he can't recall what the reactions were   Meloxicam Rash, Other (See Comments)        Medication List        Accurate as of June 04, 2023  4:23 PM. If you have any questions, ask your nurse or doctor.          acetaminophen 500 MG tablet Commonly  known as: TYLENOL Take 500 mg by mouth every 6 (six) hours as needed for mild pain, moderate pain, fever or headache.   acyclovir 400 MG tablet Commonly known as: ZOVIRAX Take 400 mg by mouth 2 (two) times daily.   ALPRAZolam 0.25 MG tablet Commonly known as: XANAX Take 1 tablet (0.25 mg total) by mouth at bedtime as needed for anxiety.   atorvastatin 40 MG tablet Commonly known as: LIPITOR Take 1 tablet (40 mg total) by mouth daily.   levothyroxine 50 MCG tablet Commonly known as: SYNTHROID Take 50 mcg by mouth daily before breakfast.   pantoprazole 40 MG tablet Commonly known as: PROTONIX Take 40 mg by mouth daily as needed.   pravastatin 10 MG tablet Commonly known as: PRAVACHOL Take 1 tablet by mouth at bedtime.   PROBIOTIC ADVANCED PO Take 1 tablet by mouth at bedtime.   senna 8.6 MG tablet Commonly known as: Senokot Take 2 tablets (17.2 mg total) by mouth 2 (two) times daily. May crush, mix with water and give sublingually if needed.   sulfamethoxazole-trimethoprim 800-160 MG tablet Commonly known as: BACTRIM DS Take 1 tablet by mouth every 12 (twelve) hours.   traZODone 50 MG tablet Commonly known as: DESYREL Take by mouth.   trimethoprim 100 MG tablet Commonly known as: TRIMPEX Take 1 tablet (100 mg total) by mouth daily.   zolpidem 5 MG tablet Commonly known as: AMBIEN TAKE 1 TABLET BY MOUTH EVERY DAY AT BEDTIME AS NEEDED FOR SLEEP        Allergies:  Allergies  Allergen Reactions   Ciprofloxacin Diarrhea    He experienced an elevation in blood pressure, shortness of breath and diarrhea when he took the Cipro.    Amoxicillin-Pot Clavulanate Rash and Other (See Comments)    Pt states he can't recall what the reactions were   Meloxicam Rash and Other (See Comments)    Family History: Family History  Problem Relation Age of Onset   Pneumonia Father     Social History:  reports that he has never smoked. He has never used smokeless tobacco. He  reports current alcohol use of about 7.0 standard drinks of alcohol per week. He reports that he does not use drugs.  ROS: Pertinent ROS in HPI  Physical Exam: BP 124/61   Pulse 75   Constitutional:  Well nourished. Alert and oriented, No acute distress. HEENT: [place] AT, moist mucus membranes.  Trachea midline Cardiovascular: No clubbing, cyanosis, or edema. Respiratory: Normal respiratory effort, no increased work of breathing. Neurologic: Grossly intact, no focal deficits, moving all 4 extremities. Psychiatric: Normal mood and affect.   Laboratory Data: Lab Results  Component Value Date   WBC 4.5 05/26/2023   HGB 12.7 (L) 05/26/2023   HCT 36.9 (L) 05/26/2023   MCV  95.8 05/26/2023   PLT 166 05/26/2023    Lab Results  Component Value Date   CREATININE 1.00 05/12/2023    Lab Results  Component Value Date   AST 27 05/12/2023   Lab Results  Component Value Date   ALT 21 05/12/2023    Urinalysis See EPIC and HPI I have reviewed the labs.   Pertinent Imaging:  06/04/23 15:49  Scan Result 26 ml    Assessment & Plan:    1. Gross hematuria -UA bland -Urine culture pending -will obtain a RUS for further evaluation  2. Urinary retention -resolved  3. Prostate cancer -will check PSA if his urine culture returns negative or after his UTI has been treated   Return in about 1 month (around 07/04/2023) for RUS report and PSA .  These notes generated with voice recognition software. I apologize for typographical errors.  Cloretta Ned  [facility] [address]  [address] [place], [place] [address] [phone]

## 2023-06-06 ENCOUNTER — Other Ambulatory Visit: Payer: Self-pay | Admitting: *Deleted

## 2023-06-06 DIAGNOSIS — C9000 Multiple myeloma not having achieved remission: Secondary | ICD-10-CM

## 2023-06-08 LAB — CULTURE, URINE COMPREHENSIVE

## 2023-06-09 ENCOUNTER — Other Ambulatory Visit: Payer: Self-pay | Admitting: Internal Medicine

## 2023-06-09 ENCOUNTER — Inpatient Hospital Stay: Payer: Medicare HMO

## 2023-06-09 ENCOUNTER — Inpatient Hospital Stay: Payer: Medicare HMO | Admitting: Internal Medicine

## 2023-06-09 VITALS — BP 111/62 | HR 89 | Temp 97.6°F | Wt 135.0 lb

## 2023-06-09 DIAGNOSIS — Z5112 Encounter for antineoplastic immunotherapy: Secondary | ICD-10-CM | POA: Diagnosis not present

## 2023-06-09 DIAGNOSIS — C9000 Multiple myeloma not having achieved remission: Secondary | ICD-10-CM

## 2023-06-09 DIAGNOSIS — G893 Neoplasm related pain (acute) (chronic): Secondary | ICD-10-CM | POA: Diagnosis not present

## 2023-06-09 DIAGNOSIS — M8458XA Pathological fracture in neoplastic disease, other specified site, initial encounter for fracture: Secondary | ICD-10-CM | POA: Diagnosis not present

## 2023-06-09 LAB — CBC WITH DIFFERENTIAL (CANCER CENTER ONLY)
Abs Immature Granulocytes: 0 10*3/uL (ref 0.00–0.07)
Band Neutrophils: 0 %
Basophils Absolute: 0 10*3/uL (ref 0.0–0.1)
Basophils Relative: 0 %
Blasts: 0 %
Eosinophils Absolute: 0.1 10*3/uL (ref 0.0–0.5)
Eosinophils Relative: 2 %
HCT: 40.5 % (ref 39.0–52.0)
Hemoglobin: 13.9 g/dL (ref 13.0–17.0)
Immature Granulocytes: 0 %
Lymphocytes Relative: 39 %
Lymphs Abs: 1.6 10*3/uL (ref 0.7–4.0)
MCH: 33.6 pg (ref 26.0–34.0)
MCHC: 34.3 g/dL (ref 30.0–36.0)
MCV: 97.8 fL (ref 80.0–100.0)
Metamyelocytes Relative: 0 %
Monocytes Absolute: 0.4 10*3/uL (ref 0.1–1.0)
Monocytes Relative: 9 %
Myelocytes: 0 %
Neutro Abs: 2.1 10*3/uL (ref 1.7–7.7)
Neutrophils Relative %: 50 %
Other: 0 %
Platelet Count: 176 10*3/uL (ref 150–400)
Promyelocytes Relative: 0 %
RBC: 4.14 MIL/uL — ABNORMAL LOW (ref 4.22–5.81)
RDW: 15.2 % (ref 11.5–15.5)
Smear Review: ADEQUATE
WBC Count: 4.2 10*3/uL (ref 4.0–10.5)
nRBC: 0 % (ref 0.0–0.2)
nRBC: 0 /100{WBCs}

## 2023-06-09 LAB — CMP (CANCER CENTER ONLY)
ALT: 20 U/L (ref 0–44)
AST: 30 U/L (ref 15–41)
Albumin: 4.1 g/dL (ref 3.5–5.0)
Alkaline Phosphatase: 92 U/L (ref 38–126)
Anion gap: 12 (ref 5–15)
BUN: 14 mg/dL (ref 8–23)
CO2: 26 mmol/L (ref 22–32)
Calcium: 10 mg/dL (ref 8.9–10.3)
Chloride: 96 mmol/L — ABNORMAL LOW (ref 98–111)
Creatinine: 1.26 mg/dL — ABNORMAL HIGH (ref 0.61–1.24)
GFR, Estimated: 56 mL/min — ABNORMAL LOW (ref >60)
Glucose, Bld: 96 mg/dL (ref 70–99)
Potassium: 4.1 mmol/L (ref 3.5–5.1)
Sodium: 134 mmol/L — ABNORMAL LOW (ref 135–145)
Total Bilirubin: 0.9 mg/dL (ref <1.2)
Total Protein: 7.9 g/dL (ref 6.5–8.1)

## 2023-06-09 MED ORDER — DIPHENHYDRAMINE HCL 25 MG PO CAPS: 50.0000 mg | ORAL_CAPSULE | Freq: Once | ORAL | Status: DC

## 2023-06-09 MED ORDER — ZOLEDRONIC ACID 4 MG/5ML IV CONC
3.0000 mg | Freq: Once | INTRAVENOUS | Status: AC
Start: 2023-06-09 — End: 2023-06-09
  Administered 2023-06-09: 3 mg via INTRAVENOUS
  Filled 2023-06-09: qty 3.75

## 2023-06-09 MED ORDER — ACETAMINOPHEN 325 MG PO TABS: 650.0000 mg | ORAL_TABLET | Freq: Once | ORAL | Status: DC

## 2023-06-09 MED ORDER — DARATUMUMAB-HYALURONIDASE-FIHJ 1800-30000 MG-UT/15ML ~~LOC~~ SOLN
1800.0000 mg | Freq: Once | SUBCUTANEOUS | Status: AC
Start: 2023-06-09 — End: 2023-06-09
  Administered 2023-06-09: 1800 mg via SUBCUTANEOUS
  Filled 2023-06-09: qty 15

## 2023-06-09 MED ORDER — SODIUM CHLORIDE 0.9% FLUSH
10.0000 mL | Freq: Two times a day (BID) | INTRAVENOUS | Status: DC
  Filled 2023-06-09: qty 10

## 2023-06-09 NOTE — Progress Notes (Signed)
[name]  Patient Care Team: [name], MD as PCP - General (Internal Medicine) [name], MD as Consulting Physician (Oncology)  CANCER STAGING   Cancer Staging  Multiple myeloma [facility]) Staging form: Plasma Cell Myeloma and Plasma Cell Disorders, AJCC 8th Edition - Clinical stage from 12/05/2022: RISS Stage II (Beta-2-microglobulin (mg/L): 3.7, Albumin (g/dL): 3.6, ISS: Stage II, High-risk cytogenetics: Absent, LDH: Normal) - Signed by [name], MD on 12/26/2022 Stage prefix: Initial diagnosis Beta 2 microglobulin range (mg/L): 3.5 to 5.49 Albumin range (g/dL): Greater than or equal to 3.5 Cytogenetics: 1q addition, Other mutation   ASSESSMENT & PLAN:  Melvin Gilmore 82 y.o. male with pmh of hypertension, hyperlipidemia, anxiety, GERD, BPH, hypothyroidism, prostate cancer s/p HIFU was seen by primary on November 13, 2022 for acute onset of lower back pain.  Was referred to medical oncology for finding of L1 lesion suspicious for metastasis.  # Multiple myeloma, RISS Stage II -MRI thoracic and lumbar spine with and without contrast was reviewed. Showed unchanged subacute compression fracture at T12.  Focal enhancing marrow replacing lesions in T6, T8, T5, T2 all suspicious for metastatic disease.  L1 lesion measuring 16 mm.  Multilevel degenerative changes present.  -SPEP/IFE showed biclonal IgA kappa paraprotein with M spike 2.1 g/dL and second spike at 0.4 g/dL.  Kappa 453, lambda 6.8 with a ratio of 66.7.  Iron panel and B12 are normal.  PSA 3.44.  CBC showed mild anemia 13.4, chronic.  On CMP normal creatinine and calcium levels.  Elevated total protein 8.8.  LDH normal.  Beta-2 microglobulin 3.7.  Albumin 3.6.  24-hour urine/UPEP showed 1.1 g protein with no M spike.  -BMBx Hypercellular marrow with 60% plasma cells.  IHC positive for CD138/ mum 1.  Kappa restricted consistent with multiple myeloma. L1 vertebral body biopsy showed numerous  plasma cells. Cytogenetics - del (13q) and gain 1.    -PET/CT showed activity in L1 and left eighth rib concerning for myeloma.  -Started on Dara-RD on 12/26/2022.  Completed cycle 1 of daratumumab ->hospitalized for urinary retention, constipation and back pain -> inpatient hospitalist transition him to hospice -> patient revoked hospice on 01/31/2023 since he was feeling better.  Multiple treatment interruptions due to UTIs and hospitalization for stroke.  -Labs reviewed and acceptable for treatment.  Will proceed with cycle 5-day 1 of daratumumab today. -Unable to tolerate even low doses of Decadron 4 mg on day 1 day 2 due to intractable hiccups.    -Myeloma panel was discussed with the patient.  SPEP/IFE 0.2 g/dL M protein, kappa slightly elevated from 18.9-48.8, lambda 5.4 with elevation in ratio from 2.70-9.04.  Repeat panel is pending from today.  -He is 3 months from the stroke (and also stroke is less likely to be related to Revlimid since he was not taking it for 3 weeks at the time of the event).  His kappa light chain is also slightly going up so we discussed about going back on Revlimid 2.5 mg 3-week on 1 week of.  Aspirin 81 mg daily.  Previously he was on 5 mg and 10 mg - could not tolerate due to severe neutropenia.  Will monitor CBC and CMP closely with Revlimid.  # Recurrent UTIs -Follows with Dr. Lonna Cobb.  On trimethoprim.  He reports he started having itching all over his body and rash.  Could be related to antibiotic.  Advised him to stop it for couple of days and see if causes  resolution in symptoms.  # Stroke - Admitted 03/07/2023 with acute onset aphasia and right hemianopia. s/p tPA with complete resolution of symptoms.  MRI brain negative.  Echo unremarkable.  On aspirin and Plavix for 21 days then aspirin alone.  On Lipitor 40 mg daily.    # Intractable hiccups -Secondary to dexamethasone. -He is not able to tolerate small dose 4 mg on day 1 and day 2.  Has stopped  it.  # Vertebral compression fractures -Dental clearance obtained. -Will start him on IV Zometa 3 mg (on 06/09/2023).  Next dose due in 3 months.  # T12 pathological fracture # Cancer-related pain - s/p kyphoplasty by Dr. Elijio Miles on 12/06/22.  No improvement in pain -Completed palliative RT on 01/20/2023.  -Pain has improved but not completely resolved.  Patient does not like to take morphine.  I discussed the case with Dr. Juliette Alcide and he does think there is a role for further kyphoplasty.  I have sent staff message again requesting that patient is interested in to schedule a follow-up.  # Constipation -Manageable with senna, MiraLAX, lactulose, Movantik as needed.  # History of prostate cancer - follows with Dr. Evelene Croon, urology for elevated PSA.  MRI prostate showed 1 mm category 5 lesion of anterior transition zone.  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 s/p HIFU of the prostate on 02/07/2022.  PSA level from 10/08/2022 was 3.34.  -PSA from 11/21/2022 3.44.  # Poor appetite # Weight loss -Could be from cancer and uncontrolled pain.  Pain management as above. Nutrition following. -Appetite has improved.  Continue with Marinol 2.5 mg once daily.   # Anxiety -He is taking Ambien 5 mg nightly prescribed by PCP.  Orders Placed This Encounter  Procedures   CBC with Differential (Cancer Center Only)    Standing Status:   Future    Standing Expiration Date:   06/22/2024   1 week labs only In 2 weeks for MD visit, labs, cycle 5-day 15 of daratumumab  The total time spent in the appointment was 30 minutes encounter with patients including review of chart and various tests results, discussions about plan of care and coordination of care plan   All questions were answered. The patient knows to call the clinic with any problems, questions or concerns. No barriers to learning was detected.  [name], MD 11/18/20242:57  PM   HISTORY OF PRESENTING ILLNESS:  Melvin Gilmore 82 y.o. male with past medical history of hypertension, hyperlipidemia, anxiety, GERD, BPH, hypothyroidism, prostate cancer s/p HIFU was seen by primary on November 13, 2022 for acute onset of lower back pain.  Patient reports that he was sitting outside and had violent sneeze which was followed by severe back pain.  He has fallen allergies.  Had x-rays followed by MRI thoracic spine without contrast.  It showed compression fracture of T12 superior endplate with surrounding marrow edema likely subacute.  15 mm T1 hypointense marrow lesion in the anterior aspect of L1 suspicious for metastasis.  Further characterization with postcontrast MRI recommended.  Prostate cancer-follows with Dr. Evelene Croon, urology for elevated PSA.  MRI prostate showed 1 mm category 5 lesion of anterior transition zone.  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 s/p HIFU of the prostate on 02/07/2022.  PSA level from 10/08/2022 was 3.34.  Interval history Patient was seen today accompanied with wife for labs and daratumumab, zometa He has been doing well overall.  Reports decreased hearing on the left side.  He is planned to see his PCP and get the ear checked for the wax. Continues to have pain in his back when he stands upright.  Pain is much better than it was previously.  Does not like to take morphine. Has been started on prophylactic trimethoprim by urology.  Reports since initiation has been having diffuse itching and some rash on the body.  I have reviewed his chart and materials related to his cancer extensively and collaborated history with the patient. Summary of oncologic history is as follows: Oncology History  Multiple myeloma (HCC)  12/05/2022 Cancer Staging   Staging form: Plasma Cell Myeloma and Plasma Cell Disorders, AJCC 8th Edition - Clinical stage from 12/05/2022: RISS Stage II (Beta-2-microglobulin  (mg/L): 3.7, Albumin (g/dL): 3.6, ISS: Stage II, High-risk cytogenetics: Absent, LDH: Normal) - Signed by [name], MD on 12/26/2022 Stage prefix: Initial diagnosis Beta 2 microglobulin range (mg/L): 3.5 to 5.49 Albumin range (g/dL): Greater than or equal to 3.5 Cytogenetics: 1q addition, Other mutation   12/13/2022 Initial Diagnosis   Multiple myeloma (HCC)   12/26/2022 - 01/24/2023 Chemotherapy   Patient is on Treatment Plan : MYELOMA NEWLY DIAGNOSED Daratumumab IV + Lenalidomide + Dexamethasone Weekly (DaraRd) q28d     02/07/2023 - 02/07/2023 Chemotherapy   Patient is on Treatment Plan : MYELOMA NON-TRANSPLANT CANDIDATES VRd weekly q21d     02/13/2023 -  Chemotherapy   Patient is on Treatment Plan : MYELOMA NEWLY DIAGNOSED Daratumumab IV + Lenalidomide + Dexamethasone Weekly (DaraRd) q28d       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   Nausea vomiting and diarrhea 01/26/2023   Prostate cancer (HCC) 2023   Seasonal allergies    Skin cancer    a.) ears   Wears partial dentures    lower    SURGICAL HISTORY: Past Surgical History:  Procedure Laterality Date   APPENDECTOMY     CATARACT EXTRACTION W/PHACO Right 01/15/2016   Procedure: CATARACT EXTRACTION PHACO AND INTRAOCULAR LENS PLACEMENT (IOC);  Surgeon: Sherald Hess, MD;  Location[facility];  Service: Ophthalmology;  Laterality: Right;  RIGHT CALL CELL PHONE WITH TIME   CHOLECYSTECTOMY     COLONOSCOPY     COLONOSCOPY WITH ESOPHAGOGASTRODUODENOSCOPY  (EGD)     ESOPHAGOGASTRODUODENOSCOPY  07/2021   ESOPHAGOGASTRODUODENOSCOPY (EGD) WITH PROPOFOL N/A 09/23/2017   Procedure: ESOPHAGOGASTRODUODENOSCOPY (EGD) WITH PROPOFOL;  Surgeon: Toledo, Boykin Nearing, MD;  Location: [facility];  Service: Gastroenterology;  Laterality: N/A;   HIGH INTENSITY FOCUSED ULTRASOUND (HIFU) OF THE PROSTATE N/A 02/07/2022   Procedure: HIGH INTENSITY FOCUSED ULTRASOUND (HIFU) OF THE PROSTATE;  Surgeon: Orson Ape, MD;  Location: [facility];  Service: Urology;  Laterality: N/A;   HYDROCELE EXCISION / REPAIR     IR KYPHO THORACIC WITH BONE BIOPSY  12/06/2022   IR RADIOLOGIST EVAL & MGMT  11/21/2022   LASIX RT EYE  PROSTATE BIOPSY N/A 01/03/2022   Procedure: PROSTATE BIOPSY  Addison Rinella;  Surgeon: Orson Ape, MD;  Location: [facility];  Service: Urology;  Laterality: N/A;   ROTATOR CUFF REPAIR Left 2001    SOCIAL HISTORY: Social History   Socioeconomic History   Marital status: Married    Spouse name: Not on file   Number of children: Not on file   Years of education: Not on file   Highest education level: Not on file  Occupational History   Not on file  Tobacco Use   Smoking status: Never   Smokeless tobacco: Never  Vaping Use   Vaping status: Never Used  Substance and Sexual Activity   Alcohol use: Yes    Alcohol/week: 7.0 standard drinks of alcohol    Types: 7 Glasses of wine per week    Comment: wine occ   Drug use: No   Sexual activity: Not on file  Other Topics Concern   Not on file  Social History Narrative   Not on file   Social Determinants of Health   Financial Resource Strain: Low Risk  (11/21/2022)   Overall Financial Resource Strain (CARDIA)    Difficulty of Paying Living Expenses: Not hard at all  Food Insecurity: Patient Unable To Answer (03/07/2023)   Hunger Vital Sign    Worried About Running Out of Food in the Last Year: Patient unable to answer    Ran Out of Food in the Last Year: Patient unable to answer  Transportation  Needs: Patient Unable To Answer (03/07/2023)   PRAPARE - Transportation    Lack of Transportation (Medical): Patient unable to answer    Lack of Transportation (Non-Medical): Patient unable to answer  Physical Activity: Not on file  Stress: Not on file  Social Connections: Not on file  Intimate Partner Violence: Patient Unable To Answer (03/07/2023)   Humiliation, Afraid, Rape, and Kick questionnaire    Fear of Current or Ex-Partner: Patient unable to answer    Emotionally Abused: Patient unable to answer    Physically Abused: Patient unable to answer    Sexually Abused: Patient unable to answer    FAMILY HISTORY: Family History  Problem Relation Age of Onset   Pneumonia Father     ALLERGIES:  is allergic to ciprofloxacin, amoxicillin-pot clavulanate, and meloxicam.  MEDICATIONS:  Current Outpatient Medications  Medication Sig Dispense Refill   acetaminophen (TYLENOL) 500 MG tablet Take 500 mg by mouth every 6 (six) hours as needed for mild pain, moderate pain, fever or headache.     acyclovir (ZOVIRAX) 400 MG tablet Take 400 mg by mouth 2 (two) times daily.     ALPRAZolam (XANAX) 0.25 MG tablet Take 1 tablet (0.25 mg total) by mouth at bedtime as needed for anxiety. 30 tablet 0   atorvastatin (LIPITOR) 40 MG tablet Take 1 tablet (40 mg total) by mouth daily. (Patient not taking: Reported on 04/17/2023) 30 tablet 2   levothyroxine (SYNTHROID, LEVOTHROID) 50 MCG tablet Take 50 mcg by mouth daily before breakfast.     pantoprazole (PROTONIX) 40 MG tablet Take 40 mg by mouth daily as needed.     pravastatin (PRAVACHOL) 10 MG tablet Take 1 tablet by mouth at bedtime.     Probiotic Product (PROBIOTIC ADVANCED PO) Take 1 tablet by mouth at bedtime.     senna (SENOKOT) 8.6 MG tablet Take 2 tablets (17.2 mg total) by mouth 2 (two) times daily. May crush, mix with water and give sublingually if needed. 28 tablet  0   sulfamethoxazole-trimethoprim (BACTRIM DS) 800-160 MG tablet Take 1 tablet by  mouth every 12 (twelve) hours. 60 tablet 0   traZODone (DESYREL) 50 MG tablet Take by mouth.     trimethoprim (TRIMPEX) 100 MG tablet Take 1 tablet (100 mg total) by mouth daily. 90 tablet 0   zolpidem (AMBIEN) 5 MG tablet TAKE 1 TABLET BY MOUTH EVERY DAY AT BEDTIME AS NEEDED FOR SLEEP 30 tablet 0   No current facility-administered medications for this visit.   Facility-Administered Medications Ordered in Other Visits  Medication Dose Route Frequency Provider Last Rate Last Admin   acetaminophen (TYLENOL) tablet 650 mg  650 mg Oral Once [name], MD       diphenhydrAMINE (BENADRYL) capsule 50 mg  50 mg Oral Once [name], MD       sodium chloride flush (NS) 0.9 % injection 10 mL  10 mL Intravenous Q12H [name], MD        REVIEW OF SYSTEMS:   Pertinent information mentioned in HPI All other systems were reviewed with the patient and are negative.  PHYSICAL EXAMINATION: ECOG PERFORMANCE STATUS: 1 - Symptomatic but completely ambulatory  Vitals:   06/09/23 1018  BP: 111/62  Pulse: 89  Temp: 97.6 F (36.4 C)  SpO2: 100%      Filed Weights   06/09/23 1018  Weight: 135 lb (61.2 kg)       GENERAL:alert, no distress and comfortable SKIN: skin color, texture, turgor are normal, no rashes or significant lesions EYES: normal, conjunctiva are pink and non-injected, sclera clear OROPHARYNX:no exudate, no erythema and lips, buccal mucosa, and tongue normal  NECK: supple, thyroid normal size, non-tender, without nodularity LYMPH:  no palpable lymphadenopathy in the cervical, axillary or inguinal LUNGS: clear to auscultation and percussion with normal breathing effort HEART: regular rate & rhythm and no murmurs and no lower extremity edema ABDOMEN:abdomen soft, non-tender and normal bowel sounds Musculoskeletal:no cyanosis of digits and no clubbing  PSYCH: alert & oriented x 3 with fluent speech NEURO: no focal motor/sensory deficits  LABORATORY DATA:  I  have reviewed the data as listed Lab Results  Component Value Date   WBC 4.2 06/09/2023   HGB 13.9 06/09/2023   HCT 40.5 06/09/2023   MCV 97.8 06/09/2023   PLT 176 06/09/2023   Recent Labs    04/14/23 0830 05/12/23 1242 06/09/23 0956  NA 128* 128* 134*  K 3.9 4.4 4.1  CL 94* 95* 96*  CO2 25 25 26   GLUCOSE 130* 89 96  BUN 16 15 14   CREATININE 0.91 1.00 1.26*  CALCIUM 9.0 8.9 10.0  GFRNONAA >60 >60 56*  PROT 6.2* 6.7 7.9  ALBUMIN 3.8 4.3 4.1  AST 36 27 30  ALT 27 21 20   ALKPHOS 83 86 92  BILITOT 0.7 0.7 0.9    RADIOGRAPHIC STUDIES: I have personally reviewed the radiological images as listed and agreed with the findings in the report. No results found.

## 2023-06-09 NOTE — Progress Notes (Signed)
Patient has been itching a lot on his back and on his arms.

## 2023-06-09 NOTE — Patient Instructions (Signed)

## 2023-06-10 LAB — KAPPA/LAMBDA LIGHT CHAINS
Kappa free light chain: 773.9 mg/L — ABNORMAL HIGH (ref 3.3–19.4)
Kappa, lambda light chain ratio: 188.76 — ABNORMAL HIGH (ref 0.26–1.65)
Lambda free light chains: 4.1 mg/L — ABNORMAL LOW (ref 5.7–26.3)

## 2023-06-11 ENCOUNTER — Encounter: Payer: Self-pay | Admitting: Internal Medicine

## 2023-06-11 ENCOUNTER — Encounter: Payer: Self-pay | Admitting: *Deleted

## 2023-06-11 ENCOUNTER — Ambulatory Visit
Admission: RE | Admit: 2023-06-11 | Discharge: 2023-06-11 | Disposition: A | Discharge status: Home / Self Care | Payer: Medicare HMO | Source: Ambulatory Visit | Attending: Urology | Admitting: Urology

## 2023-06-11 DIAGNOSIS — N390 Urinary tract infection, site not specified: Secondary | ICD-10-CM | POA: Diagnosis present

## 2023-06-11 DIAGNOSIS — R310 Gross hematuria: Secondary | ICD-10-CM | POA: Diagnosis present

## 2023-06-11 NOTE — Addendum Note (Signed)
Addended byMichaelyn Barter on: 06/11/2023 10:01 AM   Modules accepted: Orders

## 2023-06-11 NOTE — Progress Notes (Signed)
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     A cycle is every 21 days:     Bortezomib      Lenalidomide      Dexamethasone   **Always confirm dose/schedule in your pharmacy ordering system**  REASON: Disease Progression PRIOR TREATMENT: [id number]: VRd (Bortezomib 1.3 mg/m2 SUBQ D1, 8, 15 + Lenalidomide 25 mg + Dexamethasone 40 mg) q21 Days x 8 Cycles TREATMENT RESPONSE: Progressive Disease (PD)  START ON PATHWAY REGIMEN - Multiple Myeloma and Other Plasma Cell Dyscrasias     A cycle is every 21 days:     Bortezomib      Lenalidomide      Dexamethasone   **Always confirm dose/schedule in your pharmacy ordering system**  Patient Characteristics: Multiple Myeloma, Relapsed / Refractory, Second through Fourth Lines of Therapy, Not a Candidate for CAR T-cell Therapy, Fit or Candidate for Triplet Therapy, Not Lenalidomide-Refractory and Lenalidomide-based Regimen Preferred, Not a Candidate for  Anti-CD38 Antibody Disease Classification: Multiple Myeloma Therapeutic Status: Relapsed R2-ISS Staging: II Line of Therapy: Second Line Anti-CD38 Antibody Candidacy: Not a Candidate for Anti-CD38 Antibody Lenalidomide-based Regimen Preference/Candidacy: Not Lenalidomide-Refractory and Lenalidomide-based Regimen Preferred Intent of Therapy: Non-Curative / Palliative Intent, Discussed with Patient

## 2023-06-11 NOTE — Progress Notes (Signed)
Pharmacist Chemotherapy Monitoring - Initial Assessment    Anticipated start date: 06/13/23   The following has been reviewed per standard work regarding the patient's treatment regimen: The patient's diagnosis, treatment plan and drug doses, and organ/hematologic function Lab orders and baseline tests specific to treatment regimen  The treatment plan start date, drug sequencing, and pre-medications Prior authorization status  Patient's documented medication list, including drug-drug interaction screen and prescriptions for anti-emetics and supportive care specific to the treatment regimen The drug concentrations, fluid compatibility, administration routes, and timing of the medications to be used The patient's access for treatment and lifetime cumulative dose history, if applicable  The patient's medication allergies and previous infusion related reactions, if applicable   Changes made to treatment plan:  N/A  Follow up needed:  N/A   Sharen Hones, PharmD, BCPS Clinical Pharmacist   06/11/2023  2:31 PM

## 2023-06-13 ENCOUNTER — Encounter: Payer: Self-pay | Admitting: Internal Medicine

## 2023-06-13 ENCOUNTER — Inpatient Hospital Stay: Payer: Medicare HMO

## 2023-06-13 ENCOUNTER — Inpatient Hospital Stay (HOSPITAL_BASED_OUTPATIENT_CLINIC_OR_DEPARTMENT_OTHER): Payer: Medicare HMO | Admitting: Internal Medicine

## 2023-06-13 VITALS — BP 110/58 | HR 93 | Temp 97.7°F | Wt 135.0 lb

## 2023-06-13 DIAGNOSIS — Z5112 Encounter for antineoplastic immunotherapy: Secondary | ICD-10-CM | POA: Diagnosis not present

## 2023-06-13 DIAGNOSIS — C9000 Multiple myeloma not having achieved remission: Secondary | ICD-10-CM

## 2023-06-13 DIAGNOSIS — R066 Hiccough: Secondary | ICD-10-CM | POA: Diagnosis not present

## 2023-06-13 LAB — CBC WITH DIFFERENTIAL (CANCER CENTER ONLY)
Abs Immature Granulocytes: 0.01 10*3/uL (ref 0.00–0.07)
Basophils Absolute: 0 10*3/uL (ref 0.0–0.1)
Basophils Relative: 0 %
Eosinophils Absolute: 0.1 10*3/uL (ref 0.0–0.5)
Eosinophils Relative: 1 %
HCT: 34.8 % — ABNORMAL LOW (ref 39.0–52.0)
Hemoglobin: 12.1 g/dL — ABNORMAL LOW (ref 13.0–17.0)
Immature Granulocytes: 0 %
Lymphocytes Relative: 34 %
Lymphs Abs: 1.2 10*3/uL (ref 0.7–4.0)
MCH: 33.4 pg (ref 26.0–34.0)
MCHC: 34.8 g/dL (ref 30.0–36.0)
MCV: 96.1 fL (ref 80.0–100.0)
Monocytes Absolute: 0.4 10*3/uL (ref 0.1–1.0)
Monocytes Relative: 11 %
Neutro Abs: 1.9 10*3/uL (ref 1.7–7.7)
Neutrophils Relative %: 54 %
Platelet Count: 140 10*3/uL — ABNORMAL LOW (ref 150–400)
RBC: 3.62 MIL/uL — ABNORMAL LOW (ref 4.22–5.81)
RDW: 15.4 % (ref 11.5–15.5)
Smear Review: NORMAL
WBC Count: 3.5 10*3/uL — ABNORMAL LOW (ref 4.0–10.5)
nRBC: 0 % (ref 0.0–0.2)

## 2023-06-13 LAB — CMP (CANCER CENTER ONLY)
ALT: 20 U/L (ref 0–44)
AST: 26 U/L (ref 15–41)
Albumin: 3.5 g/dL (ref 3.5–5.0)
Alkaline Phosphatase: 71 U/L (ref 38–126)
Anion gap: 12 (ref 5–15)
BUN: 13 mg/dL (ref 8–23)
CO2: 22 mmol/L (ref 22–32)
Calcium: 8.2 mg/dL — ABNORMAL LOW (ref 8.9–10.3)
Chloride: 99 mmol/L (ref 98–111)
Creatinine: 1.11 mg/dL (ref 0.61–1.24)
GFR, Estimated: >60 mL/min (ref >60)
Glucose, Bld: 102 mg/dL — ABNORMAL HIGH (ref 70–99)
Potassium: 3.8 mmol/L (ref 3.5–5.1)
Sodium: 133 mmol/L — ABNORMAL LOW (ref 135–145)
Total Bilirubin: 0.8 mg/dL (ref <1.2)
Total Protein: 7 g/dL (ref 6.5–8.1)

## 2023-06-13 MED ORDER — BORTEZOMIB CHEMO SQ INJECTION 3.5 MG (2.5MG/ML)
1.3000 mg/m2 | Freq: Once | INTRAMUSCULAR | Status: AC
Start: 2023-06-13 — End: 2023-06-13
  Administered 2023-06-13: 2.25 mg via SUBCUTANEOUS
  Filled 2023-06-13: qty 0.9

## 2023-06-13 MED ORDER — METOCLOPRAMIDE HCL 10 MG PO TABS: 10.0000 mg | ORAL_TABLET | Freq: Three times a day (TID) | ORAL | 0 refills | Status: DC | PRN

## 2023-06-13 NOTE — Progress Notes (Signed)
Patient wants to discuss the new treatment what the side effects are and should he be taking the benadryl. What made his numbers in his labs go up?

## 2023-06-13 NOTE — Patient Instructions (Signed)
Revlimid - the cycle duration changes. 2 weeks on 1 week off. You can make up for 2 missed doses in the end.   Continue with aspirin daily and acyclovir twice a day

## 2023-06-13 NOTE — Progress Notes (Signed)
Cancer Center CONSULT NOTE  Patient Care Team: [name], MD as PCP - General (Internal Medicine) [name], MD as Consulting Physician (Oncology)  CANCER STAGING   Cancer Staging  Multiple myeloma [facility]) Staging form: Plasma Cell Myeloma and Plasma Cell Disorders, AJCC 8th Edition - Clinical stage from 12/05/2022: RISS Stage II (Beta-2-microglobulin (mg/L): 3.7, Albumin (g/dL): 3.6, ISS: Stage II, High-risk cytogenetics: Absent, LDH: Normal) - Signed by [name], MD on 12/26/2022 Stage prefix: Initial diagnosis Beta 2 microglobulin range (mg/L): 3.5 to 5.49 Albumin range (g/dL): Greater than or equal to 3.5 Cytogenetics: 1q addition, Other mutation   ASSESSMENT & PLAN:  Melvin Gilmore 82 y.o. male with pmh of hypertension, hyperlipidemia, anxiety, GERD, BPH, hypothyroidism, prostate cancer s/p HIFU was seen by primary on November 13, 2022 for acute onset of lower back pain.  Was referred to medical oncology for finding of L1 lesion suspicious for metastasis.  # Multiple myeloma, RISS Stage II -MRI thoracic and lumbar spine with and without contrast was reviewed. Showed unchanged subacute compression fracture at T12.  Focal enhancing marrow replacing lesions in T6, T8, T5, T2 all suspicious for metastatic disease.  L1 lesion measuring 16 mm.  Multilevel degenerative changes present.  -SPEP/IFE showed biclonal IgA kappa paraprotein with M spike 2.1 g/dL and second spike at 0.4 g/dL.  Kappa 453, lambda 6.8 with a ratio of 66.7.  Iron panel and B12 are normal.  PSA 3.44.  CBC showed mild anemia 13.4, chronic.  On CMP normal creatinine and calcium levels.  Elevated total protein 8.8.  LDH normal.  Beta-2 microglobulin 3.7.  Albumin 3.6.  24-hour urine/UPEP showed 1.1 g protein with no M spike.  -BMBx Hypercellular marrow with 60% plasma cells.  IHC positive for CD138/ mum 1.  Kappa restricted consistent with multiple myeloma. L1 vertebral body biopsy showed numerous  plasma cells. Cytogenetics - del (13q) and gain 1.    -PET/CT showed activity in L1 and left eighth rib concerning for myeloma.  -Started on Dara-RD on 12/26/2022.  Completed cycle 1 of daratumumab ->hospitalized for urinary retention, constipation and back pain -> inpatient hospitalist transition him to hospice -> patient revoked hospice on 01/31/2023 since he was feeling better.  Multiple treatment interruptions due to UTIs and hospitalization for stroke.  -Completed cycle 5-day 1 of daratumumab on 06/09/2023.  Kappa light chain has significantly increased from 48-773 with ratio of 188.  His creatinine remained stable at 1.1 which is reassuring.  I do not think we can continue with daratumumab regimen.  Plan is to switch to VRD.  We discussed about bortezomib regimen with side effects such as increased risk of infections, peripheral neuropathy, decrease in blood count, activation of herpes.  He is on acyclovir 400 mg twice daily.  Labs today look good.  Will proceed with cycle 1 day 1 of Velcade 1.3 mg/m.    - Continue with Revlimid 2.5 mg 2-week on 1 week off.  Previously did not tolerate 10 mg and 5 mg dose due to severe neutropenia.  Revlimid was then placed on hold with stroke in August 2024.  We discussed about risk versus benefit and patient is agreeable to proceed.  Continue with aspirin 81 mg daily.  -Unable to tolerate even low doses of Decadron 4 mg on day 1 day 2 due to intractable hiccups.  No improvement with gabapentin or Robaxin.  I will discuss with the patient about a trial of Reglan 1 hour prior to Decadron and see  if it helps.  # Recurrent UTIs -Follows with Dr. Lonna Cobb.  On trimethoprim.    # Stroke - Admitted 03/07/2023 with acute onset aphasia and right hemianopia. s/p tPA with complete resolution of symptoms.  MRI brain negative.  Echo unremarkable.  On aspirin and Plavix for 21 days then aspirin alone.  On Lipitor 40 mg daily.    # Intractable hiccups -Secondary to  dexamethasone. -He is not able to tolerate small dose 4 mg on day 1 and day 2.   # Vertebral compression fractures -Dental clearance obtained. - IV Zometa 3 mg (on 06/09/2023).  Next dose due in 3 months.  # T12 pathological fracture # Cancer-related pain - s/p kyphoplasty by Dr. Elijio Miles on 12/06/22.  No improvement in pain -Completed palliative RT on 01/20/2023.  -Pain has improved but not completely resolved.  Patient does not like to take morphine.  I discussed the case with Dr. Juliette Alcide and he does think there is a role for further kyphoplasty.  I have sent staff message again requesting that patient is interested in to schedule a follow-up.  # Constipation -Manageable with senna, MiraLAX, lactulose, Movantik as needed.  # History of prostate cancer - follows with Dr. Evelene Croon, urology for elevated PSA.  MRI prostate showed 1 mm category 5 lesion of anterior transition zone.  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 s/p HIFU of the prostate on 02/07/2022.  PSA level from 10/08/2022 was 3.34.  -PSA from 11/21/2022 3.44.  # Poor appetite # Weight loss -Could be from cancer and uncontrolled pain.  Pain management as above. Nutrition following. -Appetite has improved.  Continue with Marinol 2.5 mg once daily.   # Anxiety -He is taking Ambien 5 mg nightly prescribed by PCP.  Orders Placed This Encounter  Procedures   CBC with Differential (Cancer Center Only)    Standing Status:   Future    Standing Expiration Date:   06/22/2024   CMP (Cancer Center only)    Standing Status:   Future    Standing Expiration Date:   06/22/2024   CBC with Differential (Cancer Center Only)    Standing Status:   Future    Standing Expiration Date:   06/29/2024   CMP (Cancer Center only)    Standing Status:   Future    Standing Expiration Date:   06/29/2024   1 week labs only Follow-up on December 2 for MD visit, labs, Velcade  The total time spent in  the appointment was 30 minutes encounter with patients including review of chart and various tests results, discussions about plan of care and coordination of care plan   All questions were answered. The patient knows to call the clinic with any problems, questions or concerns. No barriers to learning was detected.  [name], MD 11/22/20243:33 PM   HISTORY OF PRESENTING ILLNESS:  Melvin Gilmore 82 y.o. male with past medical history of hypertension, hyperlipidemia, anxiety, GERD, BPH, hypothyroidism, prostate cancer s/p HIFU was seen by primary on November 13, 2022 for acute onset of lower back pain.  Patient reports that he was sitting outside and had violent sneeze which was followed by severe back pain.  He has fallen allergies.  Had x-rays followed by MRI thoracic spine without contrast.  It showed compression fracture of T12 superior endplate with surrounding marrow edema likely subacute.  15 mm T1 hypointense marrow lesion in the anterior aspect of L1 suspicious for metastasis.  Further characterization with postcontrast  MRI recommended.  Prostate cancer-follows with Dr. Evelene Croon, urology for elevated PSA.  MRI prostate showed 1 mm category 5 lesion of anterior transition zone.  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 s/p HIFU of the prostate on 02/07/2022.  PSA level from 10/08/2022 was 3.34.  Interval history Patient was seen today with wife to discuss about the lab reports and change in the treatment plan. He has been feeling well overall.  Denies any new changes.  I have reviewed his chart and materials related to his cancer extensively and collaborated history with the patient. Summary of oncologic history is as follows: Oncology History  Multiple myeloma (HCC)  12/05/2022 Cancer Staging   Staging form: Plasma Cell Myeloma and Plasma Cell Disorders, AJCC 8th Edition - Clinical stage from 12/05/2022: RISS Stage II  (Beta-2-microglobulin (mg/L): 3.7, Albumin (g/dL): 3.6, ISS: Stage II, High-risk cytogenetics: Absent, LDH: Normal) - Signed by [name], MD on 12/26/2022 Stage prefix: Initial diagnosis Beta 2 microglobulin range (mg/L): 3.5 to 5.49 Albumin range (g/dL): Greater than or equal to 3.5 Cytogenetics: 1q addition, Other mutation   12/13/2022 Initial Diagnosis   Multiple myeloma (HCC)   12/26/2022 - 01/24/2023 Chemotherapy   Patient is on Treatment Plan : MYELOMA NEWLY DIAGNOSED Daratumumab IV + Lenalidomide + Dexamethasone Weekly (DaraRd) q28d     02/07/2023 - 02/07/2023 Chemotherapy   Patient is on Treatment Plan : MYELOMA NON-TRANSPLANT CANDIDATES VRd weekly q21d     02/13/2023 - 06/09/2023 Chemotherapy   Patient is on Treatment Plan : MYELOMA NEWLY DIAGNOSED Daratumumab IV + Lenalidomide + Dexamethasone Weekly (DaraRd) q28d     06/13/2023 -  Chemotherapy   Patient is on Treatment Plan : MYELOMA  RVD SQ q21d x 4 cycles       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   Nausea vomiting and diarrhea 01/26/2023   Prostate cancer (HCC) 2023   Seasonal allergies    Skin cancer    a.) ears   Wears partial dentures    lower    SURGICAL HISTORY: Past Surgical History:  Procedure Laterality Date   APPENDECTOMY     CATARACT EXTRACTION W/PHACO Right 01/15/2016   Procedure: CATARACT EXTRACTION PHACO AND INTRAOCULAR LENS PLACEMENT (IOC);  Surgeon: Sherald Hess, MD;  Location: [facility];  Service: Ophthalmology;  Laterality:  Right;  RIGHT CALL CELL PHONE WITH TIME   CHOLECYSTECTOMY     COLONOSCOPY     COLONOSCOPY WITH ESOPHAGOGASTRODUODENOSCOPY (EGD)     ESOPHAGOGASTRODUODENOSCOPY  07/2021   ESOPHAGOGASTRODUODENOSCOPY (EGD) WITH PROPOFOL N/A 09/23/2017   Procedure: ESOPHAGOGASTRODUODENOSCOPY (EGD) WITH PROPOFOL;  Surgeon: Toledo, Boykin Nearing, MD;  Location: [facility];  Service: Gastroenterology;  Laterality: N/A;   HIGH INTENSITY FOCUSED ULTRASOUND (HIFU) OF THE PROSTATE N/A 02/07/2022   Procedure: HIGH INTENSITY FOCUSED ULTRASOUND (HIFU) OF THE PROSTATE;  Surgeon: Orson Ape, MD;  Location: [facility];  Service: Urology;  Laterality: N/A;  HYDROCELE EXCISION / REPAIR     IR KYPHO THORACIC WITH BONE BIOPSY  12/06/2022   IR RADIOLOGIST EVAL & MGMT  11/21/2022   LASIX RT EYE     PROSTATE BIOPSY N/A 01/03/2022   Procedure: PROSTATE BIOPSY  Addison Diegel;  Surgeon: Orson Ape, MD;  Location: [facility];  Service: Urology;  Laterality: N/A;   ROTATOR CUFF REPAIR Left 2001    SOCIAL HISTORY: Social History   Socioeconomic History   Marital status: Married    Spouse name: Not on file   Number of children: Not on file   Years of education: Not on file   Highest education level: Not on file  Occupational History   Not on file  Tobacco Use   Smoking status: Never   Smokeless tobacco: Never  Vaping Use   Vaping status: Never Used  Substance and Sexual Activity   Alcohol use: Yes    Alcohol/week: 7.0 standard drinks of alcohol    Types: 7 Glasses of wine per week    Comment: wine occ   Drug use: No   Sexual activity: Not on file  Other Topics Concern   Not on file  Social History Narrative   Not on file   Social Determinants of Health   Financial Resource Strain: Low Risk  (06/11/2023)   Received from [facility]   Overall Financial Resource Strain (CARDIA)    Difficulty of Paying Living Expenses: Not hard at all  Food Insecurity: No Food Insecurity (06/11/2023)   Received from  [facility]   Hunger Vital Sign    Worried About Running Out of Food in the Last Year: Never true    Ran Out of Food in the Last Year: Never true  Transportation Needs: No Transportation Needs (06/11/2023)   Received from [facility] - Transportation    In the past 12 months, has lack of transportation kept you from medical appointments or from getting medications?: No    Lack of Transportation (Non-Medical): No  Physical Activity: Not on file  Stress: Not on file  Social Connections: Not on file  Intimate Partner Violence: Patient Unable To Answer (03/07/2023)   Humiliation, Afraid, Rape, and Kick questionnaire    Fear of Current or Ex-Partner: Patient unable to answer    Emotionally Abused: Patient unable to answer    Physically Abused: Patient unable to answer    Sexually Abused: Patient unable to answer    FAMILY HISTORY: Family History  Problem Relation Age of Onset   Pneumonia Father     ALLERGIES:  is allergic to ciprofloxacin, amoxicillin-pot clavulanate, and meloxicam.  MEDICATIONS:  Current Outpatient Medications  Medication Sig Dispense Refill   hydrOXYzine (ATARAX) 25 MG tablet Take 1 tablet by mouth at bedtime.     traMADol (ULTRAM) 50 MG tablet Take by mouth.     acetaminophen (TYLENOL) 500 MG tablet Take 500 mg by mouth every 6 (six) hours as needed for mild pain, moderate pain, fever or headache.     acyclovir (ZOVIRAX) 400 MG tablet Take 400 mg by mouth 2 (two) times daily.     ALPRAZolam (XANAX) 0.25 MG tablet Take 1 tablet (0.25 mg total) by mouth at bedtime as needed for anxiety. 30 tablet 0   atorvastatin (LIPITOR) 40 MG tablet Take 1 tablet (40 mg total) by mouth daily. (Patient not taking: Reported on 04/17/2023) 30 tablet 2   levothyroxine (SYNTHROID, LEVOTHROID) 50 MCG tablet Take 50  mcg by mouth daily before breakfast.     pantoprazole (PROTONIX) 40 MG tablet Take 40 mg by mouth daily as needed.      pravastatin (PRAVACHOL) 10 MG tablet Take 1 tablet by mouth at bedtime.     Probiotic Product (PROBIOTIC ADVANCED PO) Take 1 tablet by mouth at bedtime.     senna (SENOKOT) 8.6 MG tablet Take 2 tablets (17.2 mg total) by mouth 2 (two) times daily. May crush, mix with water and give sublingually if needed. 28 tablet 0   sulfamethoxazole-trimethoprim (BACTRIM DS) 800-160 MG tablet Take 1 tablet by mouth every 12 (twelve) hours. 60 tablet 0   traZODone (DESYREL) 50 MG tablet Take by mouth.     trimethoprim (TRIMPEX) 100 MG tablet Take 1 tablet (100 mg total) by mouth daily. 90 tablet 0   zolpidem (AMBIEN) 5 MG tablet TAKE 1 TABLET BY MOUTH EVERY DAY AT BEDTIME AS NEEDED FOR SLEEP 30 tablet 0   No current facility-administered medications for this visit.    REVIEW OF SYSTEMS:   Pertinent information mentioned in HPI All other systems were reviewed with the patient and are negative.  PHYSICAL EXAMINATION: ECOG PERFORMANCE STATUS: 1 - Symptomatic but completely ambulatory  Vitals:   06/13/23 0928  BP: (!) 110/58  Pulse: 93  Temp: 97.7 F (36.5 C)  SpO2: 100%      Filed Weights   06/13/23 0928  Weight: 135 lb (61.2 kg)       GENERAL:alert, no distress and comfortable SKIN: skin color, texture, turgor are normal, no rashes or significant lesions EYES: normal, conjunctiva are pink and non-injected, sclera clear OROPHARYNX:no exudate, no erythema and lips, buccal mucosa, and tongue normal  NECK: supple, thyroid normal size, non-tender, without nodularity LYMPH:  no palpable lymphadenopathy in the cervical, axillary or inguinal LUNGS: clear to auscultation and percussion with normal breathing effort HEART: regular rate & rhythm and no murmurs and no lower extremity edema ABDOMEN:abdomen soft, non-tender and normal bowel sounds Musculoskeletal:no cyanosis of digits and no clubbing  PSYCH: alert & oriented x 3 with fluent speech NEURO: no focal motor/sensory deficits  LABORATORY  DATA:  I have reviewed the data as listed Lab Results  Component Value Date   WBC 3.5 (L) 06/13/2023   HGB 12.1 (L) 06/13/2023   HCT 34.8 (L) 06/13/2023   MCV 96.1 06/13/2023   PLT 140 (L) 06/13/2023   Recent Labs    05/12/23 1242 06/09/23 0956 06/13/23 0856  NA 128* 134* 133*  K 4.4 4.1 3.8  CL 95* 96* 99  CO2 25 26 22   GLUCOSE 89 96 102*  BUN 15 14 13   CREATININE 1.00 1.26* 1.11  CALCIUM 8.9 10.0 8.2*  GFRNONAA >60 56* >60  PROT 6.7 7.9 7.0  ALBUMIN 4.3 4.1 3.5  AST 27 30 26   ALT 21 20 20   ALKPHOS 86 92 71  BILITOT 0.7 0.9 0.8    RADIOGRAPHIC STUDIES: I have personally reviewed the radiological images as listed and agreed with the findings in the report. US RENAL  Result Date: 06/11/2023 CLINICAL DATA:  Gross hematuria EXAM: RENAL / URINARY TRACT ULTRASOUND COMPLETE COMPARISON:  CT abdomen pelvis 01/26/2023 FINDINGS: Right Kidney: Renal measurements: 10.7 x 4.6 x 5.1 cm = volume: 131 mL. Echogenicity within normal limits. No mass or hydronephrosis visualized. There is a 2.6 cm cyst. No imaging follow-up needed. Left Kidney: Renal measurements: 10.2 x 5.5 x 5.6 cm = volume: 165 mL. Echogenicity within normal limits. No mass or hydronephrosis  visualized. Bladder: Appears normal for degree of bladder distention. Other: None. IMPRESSION: No hydronephrosis. Electronically Signed   By: Annia Belt M.D.   On: 06/11/2023 15:58

## 2023-06-15 LAB — MULTIPLE MYELOMA PANEL, SERUM
Albumin SerPl Elph-Mcnc: 3.8 g/dL (ref 2.9–4.4)
Albumin/Glob SerPl: 1.2 (ref 0.7–1.7)
Alpha 1: 0.3 g/dL (ref 0.0–0.4)
Alpha2 Glob SerPl Elph-Mcnc: 0.6 g/dL (ref 0.4–1.0)
B-Globulin SerPl Elph-Mcnc: 0.8 g/dL (ref 0.7–1.3)
Gamma Glob SerPl Elph-Mcnc: 1.4 g/dL (ref 0.4–1.8)
Globulin, Total: 3.2 g/dL (ref 2.2–3.9)
IgA: 949 mg/dL — ABNORMAL HIGH (ref 61–437)
IgG (Immunoglobin G), Serum: 598 mg/dL — ABNORMAL LOW (ref 603–1613)
IgM (Immunoglobulin M), Srm: 10 mg/dL — ABNORMAL LOW (ref 15–143)
M Protein SerPl Elph-Mcnc: 0.8 g/dL — ABNORMAL HIGH
Total Protein ELP: 7 g/dL (ref 6.0–8.5)

## 2023-06-16 ENCOUNTER — Other Ambulatory Visit: Payer: Medicare HMO

## 2023-06-18 ENCOUNTER — Other Ambulatory Visit: Payer: Medicare HMO

## 2023-06-18 DIAGNOSIS — Z5112 Encounter for antineoplastic immunotherapy: Secondary | ICD-10-CM | POA: Diagnosis not present

## 2023-06-18 DIAGNOSIS — C9000 Multiple myeloma not having achieved remission: Secondary | ICD-10-CM

## 2023-06-18 LAB — CBC WITH DIFFERENTIAL/PLATELET
Abs Immature Granulocytes: 0.02 10*3/uL (ref 0.00–0.07)
Basophils Absolute: 0 10*3/uL (ref 0.0–0.1)
Basophils Relative: 0 %
Eosinophils Absolute: 0.1 10*3/uL (ref 0.0–0.5)
Eosinophils Relative: 2 %
HCT: 35.1 % — ABNORMAL LOW (ref 39.0–52.0)
Hemoglobin: 12.1 g/dL — ABNORMAL LOW (ref 13.0–17.0)
Immature Granulocytes: 0 %
Lymphocytes Relative: 28 %
Lymphs Abs: 1.3 10*3/uL (ref 0.7–4.0)
MCH: 33.8 pg (ref 26.0–34.0)
MCHC: 34.5 g/dL (ref 30.0–36.0)
MCV: 98 fL (ref 80.0–100.0)
Monocytes Absolute: 0.7 10*3/uL (ref 0.1–1.0)
Monocytes Relative: 16 %
Neutro Abs: 2.5 10*3/uL (ref 1.7–7.7)
Neutrophils Relative %: 54 %
Platelets: 162 10*3/uL (ref 150–400)
RBC: 3.58 MIL/uL — ABNORMAL LOW (ref 4.22–5.81)
RDW: 15.7 % — ABNORMAL HIGH (ref 11.5–15.5)
WBC: 4.5 10*3/uL (ref 4.0–10.5)
nRBC: 0 % (ref 0.0–0.2)

## 2023-06-18 LAB — COMPREHENSIVE METABOLIC PANEL
ALT: 20 U/L (ref 0–44)
AST: 22 U/L (ref 15–41)
Albumin: 3.6 g/dL (ref 3.5–5.0)
Alkaline Phosphatase: 66 U/L (ref 38–126)
Anion gap: 10 (ref 5–15)
BUN: 15 mg/dL (ref 8–23)
CO2: 24 mmol/L (ref 22–32)
Calcium: 8.6 mg/dL — ABNORMAL LOW (ref 8.9–10.3)
Chloride: 97 mmol/L — ABNORMAL LOW (ref 98–111)
Creatinine, Ser: 0.92 mg/dL (ref 0.61–1.24)
GFR, Estimated: >60 mL/min (ref >60)
Glucose, Bld: 93 mg/dL (ref 70–99)
Potassium: 3.9 mmol/L (ref 3.5–5.1)
Sodium: 131 mmol/L — ABNORMAL LOW (ref 135–145)
Total Bilirubin: 1 mg/dL (ref <1.2)
Total Protein: 7.3 g/dL (ref 6.5–8.1)

## 2023-06-23 ENCOUNTER — Inpatient Hospital Stay: Payer: Medicare HMO

## 2023-06-23 ENCOUNTER — Telehealth: Payer: Self-pay

## 2023-06-23 ENCOUNTER — Inpatient Hospital Stay: Payer: Medicare HMO | Attending: Internal Medicine

## 2023-06-23 ENCOUNTER — Ambulatory Visit: Payer: Medicare HMO

## 2023-06-23 ENCOUNTER — Other Ambulatory Visit: Payer: Medicare HMO

## 2023-06-23 ENCOUNTER — Ambulatory Visit: Payer: Medicare HMO | Admitting: Internal Medicine

## 2023-06-23 ENCOUNTER — Inpatient Hospital Stay: Payer: Medicare HMO | Admitting: Internal Medicine

## 2023-06-23 VITALS — BP 115/58 | HR 73 | Temp 97.8°F | Wt 137.0 lb

## 2023-06-23 DIAGNOSIS — K5900 Constipation, unspecified: Secondary | ICD-10-CM | POA: Diagnosis not present

## 2023-06-23 DIAGNOSIS — Z5112 Encounter for antineoplastic immunotherapy: Secondary | ICD-10-CM

## 2023-06-23 DIAGNOSIS — G893 Neoplasm related pain (acute) (chronic): Secondary | ICD-10-CM

## 2023-06-23 DIAGNOSIS — F419 Anxiety disorder, unspecified: Secondary | ICD-10-CM | POA: Insufficient documentation

## 2023-06-23 DIAGNOSIS — C9000 Multiple myeloma not having achieved remission: Secondary | ICD-10-CM | POA: Diagnosis not present

## 2023-06-23 DIAGNOSIS — Z87440 Personal history of urinary (tract) infections: Secondary | ICD-10-CM | POA: Diagnosis not present

## 2023-06-23 DIAGNOSIS — Z8546 Personal history of malignant neoplasm of prostate: Secondary | ICD-10-CM | POA: Diagnosis not present

## 2023-06-23 LAB — CBC WITH DIFFERENTIAL (CANCER CENTER ONLY)
Abs Immature Granulocytes: 0.01 10*3/uL (ref 0.00–0.07)
Basophils Absolute: 0 10*3/uL (ref 0.0–0.1)
Basophils Relative: 0 %
Eosinophils Absolute: 0.1 10*3/uL (ref 0.0–0.5)
Eosinophils Relative: 1 %
HCT: 34.7 % — ABNORMAL LOW (ref 39.0–52.0)
Hemoglobin: 11.7 g/dL — ABNORMAL LOW (ref 13.0–17.0)
Immature Granulocytes: 0 %
Lymphocytes Relative: 35 %
Lymphs Abs: 1.3 10*3/uL (ref 0.7–4.0)
MCH: 33.3 pg (ref 26.0–34.0)
MCHC: 33.7 g/dL (ref 30.0–36.0)
MCV: 98.9 fL (ref 80.0–100.0)
Monocytes Absolute: 0.4 10*3/uL (ref 0.1–1.0)
Monocytes Relative: 11 %
Neutro Abs: 1.9 10*3/uL (ref 1.7–7.7)
Neutrophils Relative %: 53 %
Platelet Count: 158 10*3/uL (ref 150–400)
RBC: 3.51 MIL/uL — ABNORMAL LOW (ref 4.22–5.81)
RDW: 16.1 % — ABNORMAL HIGH (ref 11.5–15.5)
WBC Count: 3.6 10*3/uL — ABNORMAL LOW (ref 4.0–10.5)
nRBC: 0 % (ref 0.0–0.2)

## 2023-06-23 LAB — CMP (CANCER CENTER ONLY)
ALT: 18 U/L (ref 0–44)
AST: 21 U/L (ref 15–41)
Albumin: 3.1 g/dL — ABNORMAL LOW (ref 3.5–5.0)
Alkaline Phosphatase: 67 U/L (ref 38–126)
Anion gap: 9 (ref 5–15)
BUN: 12 mg/dL (ref 8–23)
CO2: 25 mmol/L (ref 22–32)
Calcium: 8.2 mg/dL — ABNORMAL LOW (ref 8.9–10.3)
Chloride: 97 mmol/L — ABNORMAL LOW (ref 98–111)
Creatinine: 1.14 mg/dL (ref 0.61–1.24)
GFR, Estimated: >60 mL/min (ref >60)
Glucose, Bld: 99 mg/dL (ref 70–99)
Potassium: 4.2 mmol/L (ref 3.5–5.1)
Sodium: 131 mmol/L — ABNORMAL LOW (ref 135–145)
Total Bilirubin: 0.9 mg/dL (ref <1.2)
Total Protein: 6.6 g/dL (ref 6.5–8.1)

## 2023-06-23 MED ORDER — BORTEZOMIB CHEMO SQ INJECTION 3.5 MG (2.5MG/ML)
1.3000 mg/m2 | Freq: Once | INTRAMUSCULAR | Status: AC
  Administered 2023-06-23: 2.25 mg via SUBCUTANEOUS
  Filled 2023-06-23: qty 0.9

## 2023-06-23 MED ORDER — ACETAMINOPHEN 325 MG PO TABS
650.0000 mg | ORAL_TABLET | Freq: Once | ORAL | Status: AC
  Administered 2023-06-23: 650 mg via ORAL
  Filled 2023-06-23: qty 2

## 2023-06-23 MED ORDER — DIPHENHYDRAMINE HCL 25 MG PO CAPS
25.0000 mg | ORAL_CAPSULE | Freq: Once | ORAL | Status: AC
Start: 2023-06-23 — End: 2023-06-23
  Administered 2023-06-23: 25 mg via ORAL
  Filled 2023-06-23: qty 1

## 2023-06-23 NOTE — Telephone Encounter (Signed)
The authorization number for patients Revlimid refill is [id number].

## 2023-06-23 NOTE — Progress Notes (Signed)
[facility] CONSULT NOTE  Patient Care Team: [name], MD as PCP - General (Internal Medicine) [name], MD as Consulting Physician (Oncology)  CANCER STAGING   Cancer Staging  Multiple myeloma [facility]) Staging form: Plasma Cell Myeloma and Plasma Cell Disorders, AJCC 8th Edition - Clinical stage from 12/05/2022: RISS Stage II (Beta-2-microglobulin (mg/L): 3.7, Albumin (g/dL): 3.6, ISS: Stage II, High-risk cytogenetics: Absent, LDH: Normal) - Signed by [name], MD on 12/26/2022 Stage prefix: Initial diagnosis Beta 2 microglobulin range (mg/L): 3.5 to 5.49 Albumin range (g/dL): Greater than or equal to 3.5 Cytogenetics: 1q addition, Other mutation   ASSESSMENT & PLAN:  Melvin Gilmore 82 y.o. male with pmh of hypertension, hyperlipidemia, anxiety, GERD, BPH, hypothyroidism, prostate cancer s/p HIFU was seen by primary on November 13, 2022 for acute onset of lower back pain.  Was referred to medical oncology for finding of L1 lesion suspicious for metastasis.  # Multiple myeloma, RISS Stage II -MRI thoracic and lumbar spine with and without contrast was reviewed. Showed unchanged subacute compression fracture at T12.  Focal enhancing marrow replacing lesions in T6, T8, T5, T2 all suspicious for metastatic disease.  L1 lesion measuring 16 mm.  Multilevel degenerative changes present.  -SPEP/IFE showed biclonal IgA kappa paraprotein with M spike 2.1 g/dL and second spike at 0.4 g/dL.  Kappa 453, lambda 6.8 with a ratio of 66.7.  Iron panel and B12 are normal.  PSA 3.44.  CBC showed mild anemia 13.4, chronic.  On CMP normal creatinine and calcium levels.  Elevated total protein 8.8.  LDH normal.  Beta-2 microglobulin 3.7.  Albumin 3.6.  24-hour urine/UPEP showed 1.1 g protein with no M spike.  -BMBx Hypercellular marrow with 60% plasma cells.  IHC positive for CD138/ mum 1.  Kappa restricted consistent with multiple myeloma. L1 vertebral body biopsy showed numerous  plasma cells. Cytogenetics - del (13q) and gain 1.    -PET/CT showed activity in L1 and left eighth rib concerning for myeloma.  -Started on Dara-RD on 12/26/2022.  Completed cycle 1 of daratumumab ->hospitalized for urinary retention, constipation and back pain -> inpatient hospitalist transition him to hospice -> patient revoked hospice on 01/31/2023 since he was feeling better.  Multiple treatment interruptions due to UTIs and hospitalization for stroke.  -Completed cycle 5-day 1 of daratumumab on 06/09/2023.  Progression on blood work with increase in kappa light chain significantly from 48 to around 700.  Changed to VRD on 06/20/2023  -He had chills in the evening after first dose of Velcade.  Will premedicate with p.o. Tylenol 650 mg and Benadryl 25 mg today.  Proceed with cycle 1 day 8 of Velcade 1.3 mg/m today.  He will take premedications at home moving forward.  Check myeloma panel next week.  - Continue with Revlimid 2.5 mg 2-week on 1 week off.  Revlimid was then placed on hold with stroke in August 2024 (although less likely related to Revlimid since he was off it for 3 weeks recovering from neutropenia).  We discussed about risk versus benefit and patient is agreeable to proceed.  Continue with aspirin 81 mg daily.  He tolerated 2.5 mg well.  I have alerted the staff to send in a request for next round and to increase the dose to 5 mg 2 weeks on 1 week off.  -Taking Decadron 2 mg for 4 days.  Unable to tolerate higher dose due to intractable hiccups.  No improvement with gabapentin or Robaxin.    #  Recurrent UTIs -Follows with Dr. Lonna Cobb.  On trimethoprim.    # Stroke - Admitted 03/07/2023 with acute onset aphasia and right hemianopia. s/p tPA with complete resolution of symptoms.  MRI brain negative.  Echo unremarkable.  On aspirin and Plavix for 21 days then aspirin alone.  On Lipitor 40 mg daily.    # Intractable hiccups -Secondary to dexamethasone.  # Vertebral compression  fractures -Dental clearance obtained. - IV Zometa 3 mg (on 06/09/2023).  Next dose due in 3 months.  # T12 pathological fracture # Cancer-related pain -s/p kyphoplasty by Dr. Elijio Miles on 12/06/22.   -Completed palliative RT on 01/20/2023.  -Reports worsening back pain again.  Will obtain new set of MRI cervical, thoracic and lumbar spine with and without contrast.  Last scan's are from July 2024.  Will also make referral to IR for consideration of kyphoplasty. -Continues to decline opioids.  # Constipation -Manageable with senna, MiraLAX, lactulose, Movantik as needed.  # History of prostate cancer - follows with Dr. Evelene Croon, urology for elevated PSA.  MRI prostate showed 1 mm category 5 lesion of anterior transition zone.  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 s/p HIFU of the prostate on 02/07/2022.  PSA level from 10/08/2022 was 3.34.  -PSA from 11/21/2022 3.44.  # Anxiety -He is taking Ambien 5 mg nightly prescribed by PCP.  Orders Placed This Encounter  Procedures   MR Cervical Spine W Wo Contrast    Standing Status:   Future    Standing Expiration Date:   06/22/2024    Order Specific Question:   If indicated for the ordered procedure, I authorize the administration of contrast media per Radiology protocol    Answer:   Yes    Order Specific Question:   What is the patient's sedation requirement?    Answer:   No Sedation    Order Specific Question:   Does the patient have a pacemaker or implanted devices?    Answer:   No    Order Specific Question:   Use SRS Protocol?    Answer:   Yes    Order Specific Question:   Preferred imaging location?    Answer:   [facility] (table limit - [phone])   MR Thoracic Spine W Wo Contrast    Standing Status:   Future    Standing Expiration Date:   06/22/2024    Order Specific Question:   GRA to provide read?    Answer:   Yes    Order Specific Question:   If indicated for the ordered  procedure, I authorize the administration of contrast media per Radiology protocol    Answer:   Yes    Order Specific Question:   What is the patient's sedation requirement?    Answer:   No Sedation    Order Specific Question:   Use SRS Protocol?    Answer:   Yes    Order Specific Question:   Does the patient have a pacemaker or implanted devices?    Answer:   No    Order Specific Question:   Preferred imaging location?    Answer:   [facility] (table limit - 550lbs)   MR Lumbar Spine W Wo Contrast    Standing Status:   Future    Standing Expiration Date:   06/22/2024    Order Specific Question:   If indicated for the ordered procedure, I authorize the administration of contrast media per Radiology protocol  Answer:   Yes    Order Specific Question:   What is the patient's sedation requirement?    Answer:   No Sedation    Order Specific Question:   Does the patient have a pacemaker or implanted devices?    Answer:   No    Order Specific Question:   Use SRS Protocol?    Answer:   Yes    Order Specific Question:   Preferred imaging location?    Answer:   [facility] (table limit - 550lbs)   CBC with Differential (Cancer Center Only)    Standing Status:   Future    Standing Expiration Date:   07/06/2024   CMP (Cancer Center only)    Standing Status:   Future    Standing Expiration Date:   07/06/2024   Multiple Myeloma Panel (SPEP&IFE w/QIG)    Standing Status:   Future    Standing Expiration Date:   06/22/2024   Kappa/lambda light chains    Standing Status:   Future    Standing Expiration Date:   06/22/2024   RTC in 1 week for cycle 1 day 15 of Velcade.  The total time spent in the appointment was 30 minutes encounter with patients including review of chart and various tests results, discussions about plan of care and coordination of care plan   All questions were answered. The patient knows to call the clinic with any problems, questions or concerns. No barriers to  learning was detected.  [name], MD 12/2/20242:54 PM   HISTORY OF PRESENTING ILLNESS:  AVONTA FOUTY 82 y.o. male with past medical history of hypertension, hyperlipidemia, anxiety, GERD, BPH, hypothyroidism, prostate cancer s/p HIFU was seen by primary on November 13, 2022 for acute onset of lower back pain.  Patient reports that he was sitting outside and had violent sneeze which was followed by severe back pain.  He has fallen allergies.  Had x-rays followed by MRI thoracic spine without contrast.  It showed compression fracture of T12 superior endplate with surrounding marrow edema likely subacute.  15 mm T1 hypointense marrow lesion in the anterior aspect of L1 suspicious for metastasis.  Further characterization with postcontrast MRI recommended.  Prostate cancer-follows with Dr. Evelene Croon, urology for elevated PSA.  MRI prostate showed 1 mm category 5 lesion of anterior transition zone.  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 s/p HIFU of the prostate on 02/07/2022.  PSA level from 10/08/2022 was 3.34.  Interval history Patient seen today as follow-up accompanied with wife for cycle 1 day 8 of Velcade. After the first dose, he felt chills at home.  He took Tylenol which helped.  Now complaining of worsening lower back pain.  Wants to know what is going on and if he can get further intervention such as kyphoplasty.  Continues to decline opioid pain medication.  He reports episode of shortness of breath but that happens only when he is having pain in his back.  When he is using a brace and is feeling better he does not have any difficulty with breathing.  I have reviewed his chart and materials related to his cancer extensively and collaborated history with the patient. Summary of oncologic history is as follows: Oncology History  Multiple myeloma (HCC)  12/05/2022 Cancer Staging   Staging form: Plasma Cell Myeloma and Plasma Cell  Disorders, AJCC 8th Edition - Clinical stage from 12/05/2022: RISS Stage II (Beta-2-microglobulin (mg/L): 3.7, Albumin (g/dL): 3.6, ISS: Stage  II, High-risk cytogenetics: Absent, LDH: Normal) - Signed by [name], MD on 12/26/2022 Stage prefix: Initial diagnosis Beta 2 microglobulin range (mg/L): 3.5 to 5.49 Albumin range (g/dL): Greater than or equal to 3.5 Cytogenetics: 1q addition, Other mutation   12/13/2022 Initial Diagnosis   Multiple myeloma (HCC)   12/26/2022 - 01/24/2023 Chemotherapy   Patient is on Treatment Plan : MYELOMA NEWLY DIAGNOSED Daratumumab IV + Lenalidomide + Dexamethasone Weekly (DaraRd) q28d     02/07/2023 - 02/07/2023 Chemotherapy   Patient is on Treatment Plan : MYELOMA NON-TRANSPLANT CANDIDATES VRd weekly q21d     02/13/2023 - 06/09/2023 Chemotherapy   Patient is on Treatment Plan : MYELOMA NEWLY DIAGNOSED Daratumumab IV + Lenalidomide + Dexamethasone Weekly (DaraRd) q28d     06/13/2023 -  Chemotherapy   Patient is on Treatment Plan : MYELOMA  RVD SQ q21d x 4 cycles       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   Nausea vomiting and diarrhea 01/26/2023   Prostate cancer (HCC) 2023   Seasonal allergies    Skin cancer    a.) ears   Wears partial dentures    lower    SURGICAL HISTORY: Past Surgical History:  Procedure Laterality Date   APPENDECTOMY     CATARACT EXTRACTION W/PHACO Right 01/15/2016   Procedure: CATARACT EXTRACTION PHACO AND INTRAOCULAR LENS PLACEMENT (IOC);  Surgeon: Sherald Hess, MD;  Location: [facility];  Service: Ophthalmology;  Laterality: Right;  RIGHT CALL CELL PHONE WITH TIME   CHOLECYSTECTOMY     COLONOSCOPY     COLONOSCOPY WITH ESOPHAGOGASTRODUODENOSCOPY (EGD)     ESOPHAGOGASTRODUODENOSCOPY  07/2021   ESOPHAGOGASTRODUODENOSCOPY (EGD) WITH PROPOFOL N/A 09/23/2017   Procedure: ESOPHAGOGASTRODUODENOSCOPY (EGD) WITH PROPOFOL;  Surgeon: Toledo, Boykin Nearing, MD;  Location: [facility];  Service: Gastroenterology;  Laterality: N/A;   HIGH INTENSITY FOCUSED ULTRASOUND (HIFU) OF THE PROSTATE N/A 02/07/2022   Procedure: HIGH INTENSITY FOCUSED ULTRASOUND (HIFU) OF THE PROSTATE;  Surgeon: Orson Ape, MD;  Location: [facility];  Service: Urology;  Laterality: N/A;   HYDROCELE EXCISION / REPAIR     IR KYPHO THORACIC WITH BONE BIOPSY  12/06/2022   IR RADIOLOGIST EVAL & MGMT  11/21/2022   LASIX RT EYE     PROSTATE BIOPSY N/A 01/03/2022   Procedure: PROSTATE BIOPSY  Addison Vanhorne;  Surgeon: Orson Ape, MD;  Location: [facility];  Service: Urology;  Laterality: N/A;   ROTATOR CUFF REPAIR Left 2001    SOCIAL HISTORY: Social History   Socioeconomic History   Marital status: Married    Spouse name: Not on file   Number of children: Not on file   Years of education: Not on file   Highest education level: Not on file  Occupational History   Not on file  Tobacco Use   Smoking status: Never   Smokeless tobacco: Never  Vaping Use   Vaping status: Never Used  Substance and Sexual  Activity   Alcohol use: Yes    Alcohol/week: 7.0 standard drinks of alcohol    Types: 7 Glasses of wine per week    Comment: wine occ   Drug use: No   Sexual activity: Not on file  Other Topics Concern   Not on file  Social History Narrative   Not on file   Social Determinants of Health   Financial Resource Strain: Low Risk  (06/11/2023)   Received from [facility]   Overall Financial Resource Strain (CARDIA)    Difficulty of Paying Living  Expenses: Not hard at all  Food Insecurity: No Food Insecurity (06/11/2023)   Received from [facility]   Hunger Vital Sign    Worried About Running Out of Food in the Last Year: Never true    Ran Out of Food in the Last Year: Never true  Transportation Needs: No Transportation Needs (06/11/2023)   Received from [facility] - Transportation    In the past 12 months, has lack of transportation kept you from medical appointments or from getting medications?: No    Lack of Transportation (Non-Medical): No  Physical Activity: Not on file  Stress: Not on file  Social Connections: Not on file  Intimate Partner Violence: Patient Unable To Answer (03/07/2023)   Humiliation, Afraid, Rape, and Kick questionnaire    Fear of Current or Ex-Partner: Patient unable to answer    Emotionally Abused: Patient unable to answer    Physically Abused: Patient unable to answer    Sexually Abused: Patient unable to answer    FAMILY HISTORY: Family History  Problem Relation Age of Onset   Pneumonia Father     ALLERGIES:  is allergic to ciprofloxacin, amoxicillin-pot clavulanate, and meloxicam.  MEDICATIONS:  Current Outpatient Medications  Medication Sig Dispense Refill   acetaminophen (TYLENOL) 500 MG tablet Take 500 mg by mouth every 6 (six) hours as needed for mild pain, moderate pain, fever or headache.     acyclovir (ZOVIRAX) 400 MG tablet Take 400 mg by mouth 2 (two) times daily.     ALPRAZolam (XANAX) 0.25 MG tablet Take 1 tablet (0.25 mg total) by mouth at bedtime as needed for anxiety. 30 tablet 0   atorvastatin (LIPITOR) 40 MG tablet Take 1 tablet (40 mg total) by mouth daily. (Patient not taking: Reported on 04/17/2023) 30 tablet 2   hydrOXYzine (ATARAX) 25 MG tablet Take 1 tablet by mouth at bedtime.     levothyroxine (SYNTHROID, LEVOTHROID) 50 MCG tablet Take 50 mcg by mouth daily before breakfast.     metoCLOPramide (REGLAN) 10 MG tablet Take 1  tablet (10 mg total) by mouth every 8 (eight) hours as needed for up to 10 doses (hiccups). Take one pill 1 hour prior to taking Decadron. 10 tablet 0   pantoprazole (PROTONIX) 40 MG tablet Take 40 mg by mouth daily as needed.     pravastatin (PRAVACHOL) 10 MG tablet Take 1 tablet by mouth at bedtime.     Probiotic Product (PROBIOTIC ADVANCED PO) Take 1 tablet by mouth at bedtime.     senna (SENOKOT) 8.6 MG tablet Take 2 tablets (17.2 mg total) by mouth 2 (two) times daily. May crush, mix with water and give sublingually if needed. 28 tablet 0   sulfamethoxazole-trimethoprim (BACTRIM DS) 800-160 MG tablet Take 1 tablet by mouth every 12 (twelve) hours. 60 tablet 0   traZODone (DESYREL) 50 MG tablet Take by mouth.  trimethoprim (TRIMPEX) 100 MG tablet Take 1 tablet (100 mg total) by mouth daily. 90 tablet 0   zolpidem (AMBIEN) 5 MG tablet TAKE 1 TABLET BY MOUTH EVERY DAY AT BEDTIME AS NEEDED FOR SLEEP 30 tablet 0   No current facility-administered medications for this visit.    REVIEW OF SYSTEMS:   Pertinent information mentioned in HPI All other systems were reviewed with the patient and are negative.  PHYSICAL EXAMINATION: ECOG PERFORMANCE STATUS: 1 - Symptomatic but completely ambulatory  Vitals:   06/23/23 0941  BP: (!) 115/58  Pulse: 73  Temp: 97.8 F (36.6 C)  SpO2: 100%      Filed Weights   06/23/23 0941  Weight: 137 lb (62.1 kg)       GENERAL:alert, no distress and comfortable SKIN: skin color, texture, turgor are normal, no rashes or significant lesions EYES: normal, conjunctiva are pink and non-injected, sclera clear OROPHARYNX:no exudate, no erythema and lips, buccal mucosa, and tongue normal  NECK: supple, thyroid normal size, non-tender, without nodularity LYMPH:  no palpable lymphadenopathy in the cervical, axillary or inguinal LUNGS: clear to auscultation and percussion with normal breathing effort HEART: regular rate & rhythm and no murmurs and no lower  extremity edema ABDOMEN:abdomen soft, non-tender and normal bowel sounds Musculoskeletal:no cyanosis of digits and no clubbing  PSYCH: alert & oriented x 3 with fluent speech NEURO: no focal motor/sensory deficits  LABORATORY DATA:  I have reviewed the data as listed Lab Results  Component Value Date   WBC 3.6 (L) 06/23/2023   HGB 11.7 (L) 06/23/2023   HCT 34.7 (L) 06/23/2023   MCV 98.9 06/23/2023   PLT 158 06/23/2023   Recent Labs    06/13/23 0856 06/18/23 1339 06/23/23 0912  NA 133* 131* 131*  K 3.8 3.9 4.2  CL 99 97* 97*  CO2 22 24 25   GLUCOSE 102* 93 99  BUN 13 15 12   CREATININE 1.11 0.92 1.14  CALCIUM 8.2* 8.6* 8.2*  GFRNONAA >60 >60 >60  PROT 7.0 7.3 6.6  ALBUMIN 3.5 3.6 3.1*  AST 26 22 21   ALT 20 20 18   ALKPHOS 71 66 67  BILITOT 0.8 1.0 0.9    RADIOGRAPHIC STUDIES: I have personally reviewed the radiological images as listed and agreed with the findings in the report. US RENAL  Result Date: 06/11/2023 CLINICAL DATA:  Gross hematuria EXAM: RENAL / URINARY TRACT ULTRASOUND COMPLETE COMPARISON:  CT abdomen pelvis 01/26/2023 FINDINGS: Right Kidney: Renal measurements: 10.7 x 4.6 x 5.1 cm = volume: 131 mL. Echogenicity within normal limits. No mass or hydronephrosis visualized. There is a 2.6 cm cyst. No imaging follow-up needed. Left Kidney: Renal measurements: 10.2 x 5.5 x 5.6 cm = volume: 165 mL. Echogenicity within normal limits. No mass or hydronephrosis visualized. Bladder: Appears normal for degree of bladder distention. Other: None. IMPRESSION: No hydronephrosis. Electronically Signed   By: Annia Belt M.D.   On: 06/11/2023 15:58

## 2023-06-23 NOTE — Progress Notes (Signed)
Patient had diarrhea on thanksgiving. He is starting to have shortness of breath which they believe is related to his bad back pain, he has started to wear a back brace, but not helping much at all.

## 2023-06-23 NOTE — Patient Instructions (Signed)
[facility] - A DEPT OF [facility]  Discharge Instructions: Thank you for choosing [facility] to provide your oncology and hematology care.  If you have a lab appointment with the Cancer Center, please go directly to the Cancer Center and check in at the registration area.  Wear comfortable clothing and clothing appropriate for easy access to any Portacath or PICC line.   We strive to give you quality time with your provider. You may need to reschedule your appointment if you arrive late (15 or more minutes).  Arriving late affects you and other patients whose appointments are after yours.  Also, if you miss three or more appointments without notifying the office, you may be dismissed from the clinic at the provider's discretion.      For prescription refill requests, have your pharmacy contact our office and allow 72 hours for refills to be completed.    Today you received the following chemotherapy and/or immunotherapy agents VELCADE       To help prevent nausea and vomiting after your treatment, we encourage you to take your nausea medication as directed.  BELOW ARE SYMPTOMS THAT SHOULD BE REPORTED IMMEDIATELY: *FEVER GREATER THAN 100.4 F (38 C) OR HIGHER *CHILLS OR SWEATING *NAUSEA AND VOMITING THAT IS NOT CONTROLLED WITH YOUR NAUSEA MEDICATION *UNUSUAL SHORTNESS OF BREATH *UNUSUAL BRUISING OR BLEEDING *URINARY PROBLEMS (pain or burning when urinating, or frequent urination) *BOWEL PROBLEMS (unusual diarrhea, constipation, pain near the anus) TENDERNESS IN MOUTH AND THROAT WITH OR WITHOUT PRESENCE OF ULCERS (sore throat, sores in mouth, or a toothache) UNUSUAL RASH, SWELLING OR PAIN  UNUSUAL VAGINAL DISCHARGE OR ITCHING   Items with * indicate a potential emergency and should be followed up as soon as possible or go to the Emergency Department if any problems should occur.  Please show the CHEMOTHERAPY ALERT CARD or IMMUNOTHERAPY  ALERT CARD at check-in to the Emergency Department and triage nurse.  Should you have questions after your visit or need to cancel or reschedule your appointment, please contact [facility] - A DEPT OF Eligh[facility]ideg[facility]  [phone] and follow the prompts.  Office hours are 8:00 a.m. to 4:30 p.m. Monday - Friday. Please note that voicemails left after 4:00 p.m. may not be returned until the following business day.  We are closed weekends and major holidays. You have access to a nurse at all times for urgent questions. Please call the main number to the clinic [phone] and follow the prompts.  For any non-urgent questions, you may also contact your provider using MyChart. We now offer e-Visits for anyone 81 and older to request care online for non-urgent symptoms. For details visit [email or web address].   Also download the MyChart app! Go to the app store, search "MyChart", open the app, select [place], and log in with your MyChart username and password.   Bortezomib Injection What is this medication? BORTEZOMIB (bor TEZ oh mib) treats lymphoma. It may also be used to treat multiple myeloma, a type of bone marrow cancer. It works by blocking a protein that causes cancer cells to grow and multiply. This helps to slow or stop the spread of cancer cells. This medicine may be used for other purposes; ask your health care provider or pharmacist if you have questions. COMMON BRAND NAME(S): Velcade What should I tell my care team before I take this medication? They need to know if you have any  of these conditions: Dehydration Diabetes Heart disease Liver disease Tingling of the fingers or toes or other nerve disorder An unusual or allergic reaction to bortezomib, other medications, foods, dyes, or preservatives If you or your partner are pregnant or trying to get pregnant Breastfeeding How should I use this medication? This medication is injected into a vein  or under the skin. It is given by your care team in a hospital or clinic setting. Talk to your care team about the use of this medication in children. Special care may be needed. Overdosage: If you think you have taken too much of this medicine contact a poison control center or emergency room at once. NOTE: This medicine is only for you. Do not share this medicine with others. What if I miss a dose? Keep appointments for follow-up doses. It is important not to miss your dose. Call your care team if you are unable to keep an appointment. What may interact with this medication? Ketoconazole Rifampin This list may not describe all possible interactions. Give your health care provider a list of all the medicines, herbs, non-prescription drugs, or dietary supplements you use. Also tell them if you smoke, drink alcohol, or use illegal drugs. Some items may interact with your medicine. What should I watch for while using this medication? Your condition will be monitored carefully while you are receiving this medication. You may need blood work while taking this medication. This medication may affect your coordination, reaction time, or judgment. Do not drive or operate machinery until you know how this medication affects you. Sit up or stand slowly to reduce the risk of dizzy or fainting spells. Drinking alcohol with this medication can increase the risk of these side effects. This medication may increase your risk of getting an infection. Call your care team for advice if you get a fever, chills, sore throat, or other symptoms of a cold or flu. Do not treat yourself. Try to avoid being around people who are sick. Check with your care team if you have severe diarrhea, nausea, and vomiting, or if you sweat a lot. The loss of too much body fluid may make it dangerous for you to take this medication. Talk to your care team if you may be pregnant. Serious birth defects can occur if you take this medication  during pregnancy and for 7 months after the last dose. You will need a negative pregnancy test before starting this medication. Contraception is recommended while taking this medication and for 7 months after the last dose. Your care team can help you find the option that works for you. If your partner can get pregnant, use a condom during sex while taking this medication and for 4 months after the last dose. Do not breastfeed while taking this medication and for 2 months after the last dose. This medication may cause infertility. Talk to your care team if you are concerned about your fertility. What side effects may I notice from receiving this medication? Side effects that you should report to your care team as soon as possible: Allergic reactions--skin rash, itching, hives, swelling of the face, lips, tongue, or throat Bleeding--bloody or black, tar-like stools, vomiting blood or brown material that looks like coffee grounds, red or dark brown urine, small red or purple spots on skin, unusual bruising or bleeding Bleeding in the brain--severe headache, stiff neck, confusion, dizziness, change in vision, numbness or weakness of the face, arm, or leg, trouble speaking, trouble walking, vomiting Bowel blockage--stomach cramping, unable  to have a bowel movement or pass gas, loss of appetite, vomiting Heart failure--shortness of breath, swelling of the ankles, feet, or hands, sudden weight gain, unusual weakness or fatigue Infection--fever, chills, cough, sore throat, wounds that don't heal, pain or trouble when passing urine, general feeling of discomfort or being unwell Liver injury--right upper belly pain, loss of appetite, nausea, light-colored stool, dark yellow or brown urine, yellowing skin or eyes, unusual weakness or fatigue Low blood pressure--dizziness, feeling faint or lightheaded, blurry vision Lung injury--shortness of breath or trouble breathing, cough, spitting up blood, chest pain,  fever Pain, tingling, or numbness in the hands or feet Severe or prolonged diarrhea Stomach pain, bloody diarrhea, pale skin, unusual weakness or fatigue, decrease in the amount of urine, which may be signs of hemolytic uremic syndrome Sudden and severe headache, confusion, change in vision, seizures, which may be signs of posterior reversible encephalopathy syndrome (PRES) TTP--purple spots on the skin or inside the mouth, pale skin, yellowing skin or eyes, unusual weakness or fatigue, fever, fast or irregular heartbeat, confusion, change in vision, trouble speaking, trouble walking Tumor lysis syndrome (TLS)--nausea, vomiting, diarrhea, decrease in the amount of urine, dark urine, unusual weakness or fatigue, confusion, muscle pain or cramps, fast or irregular heartbeat, joint pain Side effects that usually do not require medical attention (report to your care team if they continue or are bothersome): Constipation Diarrhea Fatigue Loss of appetite Nausea This list may not describe all possible side effects. Call your doctor for medical advice about side effects. You may report side effects to FDA at 1-800-FDA-1088. Where should I keep my medication? This medication is given in a hospital or clinic. It will not be stored at home. NOTE: This sheet is a summary. It may not cover all possible information. If you have questions about this medicine, talk to your doctor, pharmacist, or health care provider.  2024 Elsevier/Gold Standard (2021-12-11 00:00:00)

## 2023-06-24 ENCOUNTER — Encounter: Payer: Self-pay | Admitting: Internal Medicine

## 2023-06-24 MED ORDER — LENALIDOMIDE 5 MG PO CAPS: 5.0000 mg | ORAL_CAPSULE | Freq: Every day | ORAL | 0 refills | Status: DC

## 2023-06-24 NOTE — Addendum Note (Signed)
Addended byMichaelyn Barter on: 06/24/2023 11:59 AM   Modules accepted: Orders

## 2023-06-25 ENCOUNTER — Telehealth: Payer: Self-pay | Admitting: *Deleted

## 2023-06-25 DIAGNOSIS — C9000 Multiple myeloma not having achieved remission: Secondary | ICD-10-CM

## 2023-06-25 NOTE — Telephone Encounter (Signed)
Revlimid prescription received does ot have the cycle on it so it cannot be accepted. They said they need a new prescription sent in because they cannot accept a verbal on this

## 2023-06-26 MED ORDER — LENALIDOMIDE 5 MG PO CAPS
5.0000 mg | ORAL_CAPSULE | Freq: Every day | ORAL | 0 refills | Status: DC
Start: 2023-06-26 — End: 2023-07-02

## 2023-06-26 NOTE — Telephone Encounter (Signed)
Rx with on/off information sent to [facility].

## 2023-06-26 NOTE — Telephone Encounter (Signed)
They want to know if she is on a 21 day cycle or 28 day cycle ie 1 daily for 14 days every 21 day or every 28 days

## 2023-06-27 ENCOUNTER — Ambulatory Visit
Admission: RE | Admit: 2023-06-27 | Discharge: 2023-06-27 | Disposition: A | Discharge status: Home / Self Care | Payer: Medicare HMO | Source: Ambulatory Visit | Attending: Internal Medicine | Admitting: Internal Medicine

## 2023-06-27 ENCOUNTER — Ambulatory Visit
Admission: RE | Admit: 2023-06-27 | Discharge: 2023-06-27 | Disposition: A | Discharge status: Home / Self Care | Payer: Medicare HMO | Source: Ambulatory Visit | Attending: Internal Medicine

## 2023-06-27 DIAGNOSIS — C9000 Multiple myeloma not having achieved remission: Secondary | ICD-10-CM

## 2023-06-27 MED ORDER — GADOBUTROL 1 MMOL/ML IV SOLN
6.0000 mL | Freq: Once | INTRAVENOUS | Status: AC | PRN
  Administered 2023-06-27: 6 mL via INTRAVENOUS

## 2023-06-30 ENCOUNTER — Inpatient Hospital Stay: Payer: Medicare HMO

## 2023-06-30 ENCOUNTER — Encounter: Payer: Self-pay | Admitting: Internal Medicine

## 2023-06-30 ENCOUNTER — Ambulatory Visit: Payer: Medicare HMO

## 2023-06-30 ENCOUNTER — Inpatient Hospital Stay: Payer: Medicare HMO | Admitting: Internal Medicine

## 2023-06-30 VITALS — BP 109/56 | HR 66 | Temp 98.6°F | Resp 16 | Wt 140.0 lb

## 2023-06-30 DIAGNOSIS — C9000 Multiple myeloma not having achieved remission: Secondary | ICD-10-CM

## 2023-06-30 DIAGNOSIS — G893 Neoplasm related pain (acute) (chronic): Secondary | ICD-10-CM

## 2023-06-30 DIAGNOSIS — Z5112 Encounter for antineoplastic immunotherapy: Secondary | ICD-10-CM | POA: Diagnosis not present

## 2023-06-30 LAB — CBC WITH DIFFERENTIAL (CANCER CENTER ONLY)
Abs Immature Granulocytes: 0.01 10*3/uL (ref 0.00–0.07)
Basophils Absolute: 0 10*3/uL (ref 0.0–0.1)
Basophils Relative: 0 %
Eosinophils Absolute: 0 10*3/uL (ref 0.0–0.5)
Eosinophils Relative: 1 %
HCT: 34.2 % — ABNORMAL LOW (ref 39.0–52.0)
Hemoglobin: 11.8 g/dL — ABNORMAL LOW (ref 13.0–17.0)
Immature Granulocytes: 0 %
Lymphocytes Relative: 35 %
Lymphs Abs: 1.1 10*3/uL (ref 0.7–4.0)
MCH: 34.4 pg — ABNORMAL HIGH (ref 26.0–34.0)
MCHC: 34.5 g/dL (ref 30.0–36.0)
MCV: 99.7 fL (ref 80.0–100.0)
Monocytes Absolute: 0.3 10*3/uL (ref 0.1–1.0)
Monocytes Relative: 10 %
Neutro Abs: 1.7 10*3/uL (ref 1.7–7.7)
Neutrophils Relative %: 54 %
Platelet Count: 136 10*3/uL — ABNORMAL LOW (ref 150–400)
RBC: 3.43 MIL/uL — ABNORMAL LOW (ref 4.22–5.81)
RDW: 16.4 % — ABNORMAL HIGH (ref 11.5–15.5)
WBC Count: 3.2 10*3/uL — ABNORMAL LOW (ref 4.0–10.5)
nRBC: 0 % (ref 0.0–0.2)

## 2023-06-30 LAB — CMP (CANCER CENTER ONLY)
ALT: 32 U/L (ref 0–44)
AST: 34 U/L (ref 15–41)
Albumin: 3.6 g/dL (ref 3.5–5.0)
Alkaline Phosphatase: 80 U/L (ref 38–126)
Anion gap: 9 (ref 5–15)
BUN: 12 mg/dL (ref 8–23)
CO2: 24 mmol/L (ref 22–32)
Calcium: 8.6 mg/dL — ABNORMAL LOW (ref 8.9–10.3)
Chloride: 97 mmol/L — ABNORMAL LOW (ref 98–111)
Creatinine: 0.84 mg/dL (ref 0.61–1.24)
GFR, Estimated: >60 mL/min (ref >60)
Glucose, Bld: 88 mg/dL (ref 70–99)
Potassium: 3.9 mmol/L (ref 3.5–5.1)
Sodium: 130 mmol/L — ABNORMAL LOW (ref 135–145)
Total Bilirubin: 0.7 mg/dL (ref <1.2)
Total Protein: 6.6 g/dL (ref 6.5–8.1)

## 2023-06-30 NOTE — Progress Notes (Unsigned)
07/02/2023 4:03 PM   Melvin Gilmore 1941/06/23 [id number]  Referring provider: [name], MD [address] [facility] [place],  [place] [address]  Urological history: 1.  Prostate cancer -PSA pending  -Previously followed by Dr. 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 Prostate biopsy performed 01/03/22 for a PSA of 8.53 and MRI showing a PI-RADS5 lesion at the left anterior transition zone. Fusion biopsy return Gleason 3+4 adenocarcinoma on the ROI cores. -He underwent HIFU by Dr. Evelene Croon 02/07/22  2.  Urinary retention -secondary to constipation (01/2023)  3. rUTI's -Contributing factors of age and low immunity -Documented urine cultures over the last year  June 04, 2023 mixed urogenital flora  April 17, 2023 E. Coli  March 21, 2023 E. Coli  February 25, 2023 E. Coli -Trimethoprim 100 mg daily   Chief Complaint  Patient presents with   Follow-up   HPI: Melvin Gilmore is a 82 y.o. male who presents today for RUS results  with his wife, [name].    Previous records reviewed.    At his visit on 06/04/2023, he had an episode of hematuria yesterday and then it cleared by the afternoon.  He has not had any issues last evening or today.  Patient denies any modifying or aggravating factors.  Patient denies any recent UTI's, gross hematuria, dysuria or suprapubic/flank pain.  Patient denies any fevers, chills, nausea or vomiting.  UA moderate bacteria.   Urine culture is positive for mixed urogenital flora.  PVR 26 mL.  He has also been experiencing lower back pain.   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    RUS (05/2023) - no hydro or masses  He has no UTI symptoms today.  He has noticed that he has had a rash and started the trimethoprim.  He is wondering if he can stop the antibiotics at this time since he is feeling well.  Patient  denies any modifying or aggravating factors.  Patient denies any recent UTI's, gross hematuria, dysuria or suprapubic/flank pain.  Patient denies any fevers, chills, nausea or vomiting.    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   Nausea vomiting and diarrhea 01/26/2023   Prostate cancer (HCC) 2023   Seasonal allergies    Skin cancer    a.) ears   Wears partial dentures    lower    Surgical History: Past Surgical History:  Procedure Laterality Date   APPENDECTOMY     CATARACT EXTRACTION W/PHACO Right 01/15/2016   Procedure: CATARACT EXTRACTION PHACO AND INTRAOCULAR LENS PLACEMENT (IOC);  Surgeon: Sherald Hess, MD;  Location: [facility];  Service: Ophthalmology;  Laterality: Right;  RIGHT CALL CELL PHONE WITH TIME   CHOLECYSTECTOMY     COLONOSCOPY     COLONOSCOPY WITH ESOPHAGOGASTRODUODENOSCOPY (EGD)     ESOPHAGOGASTRODUODENOSCOPY  07/2021  ESOPHAGOGASTRODUODENOSCOPY (EGD) WITH PROPOFOL N/A 09/23/2017   Procedure: ESOPHAGOGASTRODUODENOSCOPY (EGD) WITH PROPOFOL;  Surgeon: Toledo, Boykin Nearing, MD;  Location: [facility];  Service: Gastroenterology;  Laterality: N/A;   HIGH INTENSITY FOCUSED ULTRASOUND (HIFU) OF THE PROSTATE N/A 02/07/2022   Procedure: HIGH INTENSITY FOCUSED ULTRASOUND (HIFU) OF THE PROSTATE;  Surgeon: Orson Ape, MD;  Location: [facility];  Service: Urology;  Laterality: N/A;   HYDROCELE EXCISION / REPAIR     IR KYPHO THORACIC WITH BONE BIOPSY  12/06/2022   IR RADIOLOGIST EVAL & MGMT  11/21/2022   LASIX RT  EYE     PROSTATE BIOPSY N/A 01/03/2022   Procedure: PROSTATE BIOPSY  Addison Ellegood;  Surgeon: Orson Ape, MD;  Location: [facility];  Service: Urology;  Laterality: N/A;   ROTATOR CUFF REPAIR Left 2001    Home Medications:  Allergies as of 07/02/2023       Reactions   Ciprofloxacin Diarrhea   He experienced an elevation in blood pressure, shortness of breath and diarrhea when he took the Cipro.    Amoxicillin-pot Clavulanate Rash, Other (See Comments)   Pt states he can't recall what the reactions were   Meloxicam Rash, Other (See Comments)   Trimethoprim Rash        Medication List        Accurate as of July 02, 2023  4:03 PM. If you have any questions, ask your nurse or doctor.          acetaminophen 500 MG tablet Commonly known as: TYLENOL Take 500 mg by mouth every 6 (six) hours as needed for mild pain, moderate pain, fever or headache.   acyclovir 400 MG tablet Commonly known as: ZOVIRAX Take 400 mg by mouth 2 (two) times daily.   ALPRAZolam 0.25 MG tablet Commonly known as: XANAX Take 1 tablet (0.25 mg total) by mouth at bedtime as needed for anxiety.   atorvastatin 40 MG tablet Commonly known as: LIPITOR Take 1 tablet (40 mg total) by mouth daily.   hydrOXYzine 25 MG tablet Commonly known as: ATARAX Take 1 tablet by mouth at bedtime.   ixazomib citrate 4 MG capsule Commonly known as: NINLARO Take 1 capsule (4 mg) by mouth weekly, 3 weeks on, 1 week off, repeat every 4 weeks. Take on an empty stomach 1hr before or 2hr after meals.   lenalidomide 5 MG capsule Commonly known as: REVLIMID Take 5 mg by mouth daily. Take for 14 days, then hold for 14 days. Repeat every 28 days. [employer] Auth #    Date Obtained   levothyroxine 50 MCG tablet Commonly known as: SYNTHROID Take 50 mcg by mouth daily before breakfast.   metoCLOPramide 10 MG tablet Commonly known as: REGLAN Take 1 tablet (10 mg total) by mouth every 8 (eight) hours as needed for up to 10 doses  (hiccups). Take one pill 1 hour prior to taking Decadron.   pantoprazole 40 MG tablet Commonly known as: PROTONIX Take 40 mg by mouth daily as needed.   pravastatin 10 MG tablet Commonly known as: PRAVACHOL Take 1 tablet by mouth at bedtime.   PROBIOTIC ADVANCED PO Take 1 tablet by mouth at bedtime.   senna 8.6 MG tablet Commonly known as: Senokot Take 2 tablets (17.2 mg total) by mouth 2 (two) times daily. May crush, mix with water and give sublingually if needed.   sulfamethoxazole-trimethoprim 800-160 MG tablet Commonly known as: BACTRIM DS Take 1 tablet by mouth every 12 (twelve) hours.   traZODone 50 MG tablet  Commonly known as: DESYREL Take by mouth.   trimethoprim 100 MG tablet Commonly known as: TRIMPEX Take 1 tablet (100 mg total) by mouth daily.   zolpidem 5 MG tablet Commonly known as: AMBIEN TAKE 1 TABLET BY MOUTH EVERY DAY AT BEDTIME AS NEEDED FOR SLEEP        Allergies:  Allergies  Allergen Reactions   Ciprofloxacin Diarrhea    He experienced an elevation in blood pressure, shortness of breath and diarrhea when he took the Cipro.    Amoxicillin-Pot Clavulanate Rash and Other (See Comments)    Pt states he can't recall what the reactions were   Meloxicam Rash and Other (See Comments)   Trimethoprim Rash    Family History: Family History  Problem Relation Age of Onset   Pneumonia Father     Social History:  reports that he has never smoked. He has never used smokeless tobacco. He reports current alcohol use of about 7.0 standard drinks of alcohol per week. He reports that he does not use drugs.  ROS: Pertinent ROS in HPI  Physical Exam: BP 138/67   Pulse 80   Ht 5\' 11"  (1.803 m)   Wt 140 lb (63.5 kg)   BMI 19.53 kg/m   Constitutional:  Well nourished. Alert and oriented, No acute distress. HEENT: [place] AT, moist mucus membranes.  Trachea midline Cardiovascular: No clubbing, cyanosis, or edema. Respiratory: Normal respiratory effort, no  increased work of breathing. Neurologic: Grossly intact, no focal deficits, moving all 4 extremities. Psychiatric: Normal mood and affect.   Laboratory Data: Lab Results  Component Value Date   WBC 3.2 (L) 06/30/2023   HGB 11.8 (L) 06/30/2023   HCT 34.2 (L) 06/30/2023   MCV 99.7 06/30/2023   PLT 136 (L) 06/30/2023    Lab Results  Component Value Date   CREATININE 0.84 06/30/2023    Lab Results  Component Value Date   AST 34 06/30/2023   Lab Results  Component Value Date   ALT 32 06/30/2023    Urinalysis See EPIC and HPI I have reviewed the labs.   Pertinent Imaging: Narrative & Impression  CLINICAL DATA:  Gross hematuria   EXAM: RENAL / URINARY TRACT ULTRASOUND COMPLETE   COMPARISON:  CT abdomen pelvis 01/26/2023   FINDINGS: Right Kidney:   Renal measurements: 10.7 x 4.6 x 5.1 cm = volume: 131 mL. Echogenicity within normal limits. No mass or hydronephrosis visualized. There is a 2.6 cm cyst. No imaging follow-up needed.   Left Kidney:   Renal measurements: 10.2 x 5.5 x 5.6 cm = volume: 165 mL. Echogenicity within normal limits. No mass or hydronephrosis visualized.   Bladder:   Appears normal for degree of bladder distention.   Other:   None.   IMPRESSION: No hydronephrosis.     Electronically Signed   By: Annia Belt M.D.   On: 06/11/2023 15:58  I have independently reviewed the films.  See HPI.    Assessment & Plan:    1. Gross hematuria -RUS with no hydronephrosis or masses  -Explained the next step would be cystoscopy to evaluate for bladder cancer  -They would like to hold off until next year on the cystoscopy  2. Urinary retention -resolved  3. Prostate cancer -will check PSA today   4. rUTI's -No symptoms at today's visit -We will go ahead and discontinue the trimethoprim as he has been on it for 3 months and he is starting to have rashes with the medication  Return for pending  blood work .  These notes generated with  voice recognition software. I apologize for typographical errors.  Cloretta Ned  [facility] [address]  [address] [place], [place] [address] [phone]

## 2023-06-30 NOTE — Progress Notes (Unsigned)
Patient states that the day of this (Velcade) chemo treatment, starting at 4:00 pm up until 1:30 am he started hallucinating, fever, chills, tremors, all in all he felt like he was going to die. He is refusing to ever get that type of treatment again.   About 3 days ago, he started having nasal congestion symptoms, they are using over the counter medication for and they say it is helping.

## 2023-07-01 ENCOUNTER — Telehealth: Payer: Self-pay | Admitting: Pharmacist

## 2023-07-01 ENCOUNTER — Encounter: Payer: Self-pay | Admitting: Internal Medicine

## 2023-07-01 ENCOUNTER — Other Ambulatory Visit (HOSPITAL_COMMUNITY): Payer: Self-pay

## 2023-07-01 DIAGNOSIS — C9000 Multiple myeloma not having achieved remission: Secondary | ICD-10-CM

## 2023-07-01 LAB — KAPPA/LAMBDA LIGHT CHAINS
Kappa free light chain: 174.1 mg/L — ABNORMAL HIGH (ref 3.3–19.4)
Kappa, lambda light chain ratio: 36.27 — ABNORMAL HIGH (ref 0.26–1.65)
Lambda free light chains: 4.8 mg/L — ABNORMAL LOW (ref 5.7–26.3)

## 2023-07-01 MED ORDER — IXAZOMIB CITRATE 4 MG PO CAPS
ORAL_CAPSULE | ORAL | 0 refills | Status: DC
  Filled 2023-07-02: qty 3, 28d supply, fill #0

## 2023-07-01 NOTE — Telephone Encounter (Signed)
Clinical Pharmacist Practitioner Encounter   Received new prescription for Ninlaro (ixazomib) for the treatment of progressive multiple myeloma in conjunction with lenalidomide and dexamethasone, planned duration until disease control or unacceptable drug toxicity.  CMP from 06/30/23 assessed, no relevant lab abnormalities. Prescription dose and frequency assessed.   Current medication list in Epic reviewed, no DDIs with ixazomib identified.   Evaluated chart and no patient barriers to medication adherence identified.   Prescription has been e-scribed to the [facility] for benefits analysis and approval.  Oral Oncology Clinic will continue to follow for insurance authorization, copayment issues, initial counseling and start date.   Remi Haggard, PharmD, BCPS, BCOP, CPP Hematology/Oncology Clinical Pharmacist Practitioner [facility] [phone]  07/01/2023 1:14 PM

## 2023-07-01 NOTE — Telephone Encounter (Signed)
Clinical Pharmacist Practitioner Encounter  New authorization  Received notification from [facility] that prior authorization for Clearnce Sorrel is required.   PA submitted on CMM Key [id number] Status is pending   Oral Oncology Clinic will continue to follow.   Remi Haggard, PharmD, BCPS, [facility] Hematology/Oncology Clinical Pharmacist [place] [phone]  07/01/2023 1:31 PM

## 2023-07-01 NOTE — Telephone Encounter (Signed)
Clinical Pharmacist Practitioner Encounter   New authorization  Prior Authorization for Melvin Gilmore has been approved.     PA# [id number] Effective dates: 07/01/23 through 07/22/23  Copay $0   Oral Oncology Clinic will continue to follow.   Remi Haggard, PharmD, BCPS. [facility] Hematology/Oncology Clinical Pharmacist [facility] [phone]  07/01/2023 1:38 PM

## 2023-07-01 NOTE — Progress Notes (Signed)
[facility] CONSULT NOTE  Patient Care Team: [name], MD as PCP - General (Internal Medicine) [name], MD as Consulting Physician (Oncology)  CANCER STAGING   Cancer Staging  Multiple myeloma [facility]) Staging form: Plasma Cell Myeloma and Plasma Cell Disorders, AJCC 8th Edition - Clinical stage from 12/05/2022: RISS Stage II (Beta-2-microglobulin (mg/L): 3.7, Albumin (g/dL): 3.6, ISS: Stage II, High-risk cytogenetics: Absent, LDH: Normal) - Signed by [name], MD on 12/26/2022 Stage prefix: Initial diagnosis Beta 2 microglobulin range (mg/L): 3.5 to 5.49 Albumin range (g/dL): Greater than or equal to 3.5 Cytogenetics: 1q addition, Other mutation   ASSESSMENT & PLAN:  Melvin Gilmore 82 y.o. male with pmh of hypertension, hyperlipidemia, anxiety, GERD, BPH, hypothyroidism, prostate cancer s/p HIFU was seen by primary on November 13, 2022 for acute onset of lower back pain follows with medical oncology for management of multiple myeloma.  # Multiple myeloma, RISS Stage II, high risk -MRI thoracic and lumbar spine with and without contrast was reviewed. Showed unchanged subacute compression fracture at T12.  Focal enhancing marrow replacing lesions in T6, T8, T5, T2 all suspicious for metastatic disease.  L1 lesion measuring 16 mm.  Multilevel degenerative changes present.  -SPEP/IFE showed biclonal IgA kappa paraprotein with M spike 2.1 g/dL and second spike at 0.4 g/dL.  Kappa 453, lambda 6.8 with a ratio of 66.7.  Iron panel and B12 are normal.  PSA 3.44.  CBC showed mild anemia 13.4, chronic.  On CMP normal creatinine and calcium levels.  Elevated total protein 8.8.  LDH normal.  Beta-2 microglobulin 3.7.  Albumin 3.6.  24-hour urine/UPEP showed 1.1 g protein with no M spike.  -BMBx Hypercellular marrow with 60% plasma cells.  IHC positive for CD138/ mum 1.  Kappa restricted consistent with multiple myeloma. L1 vertebral body biopsy showed numerous plasma  cells. Cytogenetics - del (13q) and gain 1.    -PET/CT showed activity in L1 and left eighth rib concerning for myeloma.  -Started on Dara-RD on 12/26/2022.  Completed cycle 1 of daratumumab ->hospitalized for urinary retention, constipation and back pain -> inpatient hospitalist transition him to hospice -> patient revoked hospice on 01/31/2023 since he was feeling better.  Multiple treatment interruptions due to UTIs and hospitalization for stroke.  -Completed cycle 5-day 1 of daratumumab on 06/09/2023.  Progressed with increase in kappa light chain from 48 to 700 ratio 188.  Changed to VRD on 06/20/2023.  With C1D1 of Velcade he had rigors.  Premedicated with Tylenol and Benadryl with C1D8.  4 hours postinfusion had severe rigors, hallucinations, weakness.  Patient never wants to go back on Velcade.  -Plan to switch to Ixazomib 4 mg once weekly 3 weeks on and 1 week off.  It has been difficult to give him injections/IV infusion due to his intolerance to Dex which is needed as premedication.  Will continue with Revlimid 5 mg 2 weeks on and 2-week off.  Unable to tolerate longer duration due to neutropenia.  Dexamethasone 2 mg once daily for 4 days.  He is unable to tolerate higher dose of dexamethasone due to intractable hiccups.  No improvement with use of gabapentin or muscle relaxer.  -Previously briefly treated with Revlimid 10 mg did not tolerate due to neutropenia. Then developed stroke in August 2024 (although less likely related to Revlimid since he was off it for 3 weeks recovering from neutropenia).  We discussed about risk versus benefit and patient is agreeable to proceed.  Continue with aspirin  81 mg daily.    # Recurrent UTIs -Follows with Dr. Lonna Cobb.  On trimethoprim.    # Stroke - Admitted 03/07/2023 with acute onset aphasia and right hemianopia. s/p tPA with complete resolution of symptoms.  MRI brain negative.  Echo unremarkable.  On aspirin and Plavix for 21 days then aspirin alone.   On Lipitor 40 mg daily.    # Intractable hiccups -Secondary to dexamethasone.  # Vertebral compression fractures -Dental clearance obtained. - IV Zometa 3 mg (on 06/09/2023).  Next dose due in 3 months.  # T12 pathological fracture # Cancer-related pain -s/p kyphoplasty by Dr. Elijio Miles on 12/06/22.   -Completed palliative RT on 01/20/2023.  -MRI spine performed From 06/27/2023 shows progression of multiple myeloma with innumerable lesions throughout the covered skeleton since July 2024.  Compression fractures seen on prior have healed.  No acute fracture.  Patient inquiring further role for kyphoplasty which I think is limited with no new acute fractures.  Continue with brace.  Can use hemp cream OTC.  Continues to decline oral opioids  # Constipation -Manageable with senna, MiraLAX, lactulose, Movantik as needed.  # History of prostate cancer - follows with Dr. Evelene Croon, urology for elevated PSA.  MRI prostate showed 1 mm category 5 lesion of anterior transition zone.  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 s/p HIFU of the prostate on 02/07/2022.  PSA level from 10/08/2022 was 3.34.  -PSA from 11/21/2022 3.44.  # Anxiety -He is taking Ambien 5 mg nightly prescribed by PCP.  Orders Placed This Encounter  Procedures   CBC with Differential/Platelet    Standing Status:   Future    Standing Expiration Date:   06/30/2024   Comprehensive metabolic panel    Standing Status:   Future    Standing Expiration Date:   06/30/2024   RTC in 1 week for MD visit, labs  The total time spent in the appointment was 30 minutes encounter with patients including review of chart and various tests results, discussions about plan of care and coordination of care plan   All questions were answered. The patient knows to call the clinic with any problems, questions or concerns. No barriers to learning was detected.  [name], MD 12/10/20244:20  PM   HISTORY OF PRESENTING ILLNESS:  Melvin Gilmore 82 y.o. male with past medical history of hypertension, hyperlipidemia, anxiety, GERD, BPH, hypothyroidism, prostate cancer s/p HIFU was seen by primary on November 13, 2022 for acute onset of lower back pain.  Patient reports that he was sitting outside and had violent sneeze which was followed by severe back pain.  He has fallen allergies.  Had x-rays followed by MRI thoracic spine without contrast.  It showed compression fracture of T12 superior endplate with surrounding marrow edema likely subacute.  15 mm T1 hypointense marrow lesion in the anterior aspect of L1 suspicious for metastasis.  Further characterization with postcontrast MRI recommended.  Prostate cancer-follows with Dr. Evelene Croon, urology for elevated PSA.  MRI prostate showed 1 mm category 5 lesion of anterior transition zone.  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 s/p HIFU of the prostate on 02/07/2022.  PSA level from 10/08/2022 was 3.34.  Interval history Patient seen today as follow-up accompanied with wife for cycle 1 day 15 of Velcade. Had bad reaction to Velcade 4 hours after the injection.  Developed severe rigors which lasted for about 8 hours.  He was taking  Tylenol with no improvement.  Also had hallucinations.  Was feeling extremely weak after the side effects and went back to sleep.  Does not think he would ever want to go back to this medication.  Continues to have low-grade chronic back pain  I have reviewed his chart and materials related to his cancer extensively and collaborated history with the patient. Summary of oncologic history is as follows: Oncology History  Multiple myeloma (HCC)  12/05/2022 Cancer Staging   Staging form: Plasma Cell Myeloma and Plasma Cell Disorders, AJCC 8th Edition - Clinical stage from 12/05/2022: RISS Stage II (Beta-2-microglobulin (mg/L): 3.7, Albumin (g/dL): 3.6, ISS: Stage II,  High-risk cytogenetics: Absent, LDH: Normal) - Signed by [name], MD on 12/26/2022 Stage prefix: Initial diagnosis Beta 2 microglobulin range (mg/L): 3.5 to 5.49 Albumin range (g/dL): Greater than or equal to 3.5 Cytogenetics: 1q addition, Other mutation   12/13/2022 Initial Diagnosis   Multiple myeloma (HCC)   12/26/2022 - 01/24/2023 Chemotherapy   Patient is on Treatment Plan : MYELOMA NEWLY DIAGNOSED Daratumumab IV + Lenalidomide + Dexamethasone Weekly (DaraRd) q28d     02/07/2023 - 02/07/2023 Chemotherapy   Patient is on Treatment Plan : MYELOMA NON-TRANSPLANT CANDIDATES VRd weekly q21d     02/13/2023 - 06/09/2023 Chemotherapy   Patient is on Treatment Plan : MYELOMA NEWLY DIAGNOSED Daratumumab IV + Lenalidomide + Dexamethasone Weekly (DaraRd) q28d     06/13/2023 - 06/23/2023 Chemotherapy   Patient is on Treatment Plan : MYELOMA  RVD SQ q21d x 4 cycles       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   Nausea vomiting and diarrhea 01/26/2023   Prostate cancer (HCC) 2023   Seasonal allergies    Skin cancer    a.) ears   Wears partial dentures    lower    SURGICAL HISTORY: Past Surgical History:  Procedure Laterality Date   APPENDECTOMY     CATARACT EXTRACTION W/PHACO Right 01/15/2016   Procedure: CATARACT EXTRACTION PHACO AND INTRAOCULAR LENS PLACEMENT (IOC);  Surgeon: Sherald Hess, MD;  Location: [facility];  Service: Ophthalmology;  Laterality: Right;  RIGHT CALL CELL PHONE WITH TIME   CHOLECYSTECTOMY      COLONOSCOPY     COLONOSCOPY WITH ESOPHAGOGASTRODUODENOSCOPY (EGD)     ESOPHAGOGASTRODUODENOSCOPY  07/2021   ESOPHAGOGASTRODUODENOSCOPY (EGD) WITH PROPOFOL N/A 09/23/2017   Procedure: ESOPHAGOGASTRODUODENOSCOPY (EGD) WITH PROPOFOL;  Surgeon: Toledo, Boykin Nearing, MD;  Location: [facility];  Service: Gastroenterology;  Laterality: N/A;   HIGH INTENSITY FOCUSED ULTRASOUND (HIFU) OF THE PROSTATE N/A 02/07/2022   Procedure: HIGH INTENSITY FOCUSED ULTRASOUND (HIFU) OF THE PROSTATE;  Surgeon: Orson Ape, MD;  Location: [facility];  Service: Urology;  Laterality: N/A;   HYDROCELE EXCISION / REPAIR     IR KYPHO THORACIC WITH BONE BIOPSY  12/06/2022   IR RADIOLOGIST EVAL & MGMT  11/21/2022   LASIX RT EYE     PROSTATE BIOPSY N/A 01/03/2022  Procedure: PROSTATE BIOPSY  URONAV;  Surgeon: Orson Ape, MD;  Location: [facility];  Service: Urology;  Laterality: N/A;   ROTATOR CUFF REPAIR Left 2001    SOCIAL HISTORY: Social History   Socioeconomic History   Marital status: Married    Spouse name: Not on file   Number of children: Not on file   Years of education: Not on file   Highest education level: Not on file  Occupational History   Not on file  Tobacco Use   Smoking status: Never   Smokeless tobacco: Never  Vaping Use   Vaping status: Never Used  Substance and Sexual Activity   Alcohol use: Yes    Alcohol/week: 7.0 standard drinks of alcohol    Types: 7 Glasses of wine per week    Comment: wine occ   Drug use: No   Sexual activity: Not on file  Other Topics Concern   Not on file  Social History Narrative   Not on file   Social Determinants of Health   Financial Resource Strain: Low Risk  (06/11/2023)   Received from [facility]   Overall Financial Resource Strain (CARDIA)    Difficulty of Paying Living Expenses: Not hard at all  Food Insecurity: No Food Insecurity (06/11/2023)   [facility]   Hunger Vital Sign     Worried About Running Out of Food in the Last Year: Never true    Ran Out of Food in the Last Year: Never true  Transportation Needs: No Transportation Needs (06/11/2023)   [facility] - Transportation    In the past 12 months, has lack of transportation kept you from medical appointments or from getting medications?: No    Lack of Transportation (Non-Medical): No  Physical Activity: Not on file  Stress: Not on file  Social Connections: Not on file  Intimate Partner Violence: Patient Unable To Answer (03/07/2023)   Humiliation, Afraid, Rape, and Kick questionnaire    Fear of Current or Ex-Partner: Patient unable to answer    Emotionally Abused: Patient unable to answer    Physically Abused: Patient unable to answer    Sexually Abused: Patient unable to answer    FAMILY HISTORY: Family History  Problem Relation Age of Onset   Pneumonia Father     ALLERGIES:  is allergic to ciprofloxacin, amoxicillin-pot clavulanate, and meloxicam.  MEDICATIONS:  Current Outpatient Medications  Medication Sig Dispense Refill   acetaminophen (TYLENOL) 500 MG tablet Take 500 mg by mouth every 6 (six) hours as needed for mild pain, moderate pain, fever or headache.     acyclovir (ZOVIRAX) 400 MG tablet Take 400 mg by mouth 2 (two) times daily.     ALPRAZolam (XANAX) 0.25 MG tablet Take 1 tablet (0.25 mg total) by mouth at bedtime as needed for anxiety. 30 tablet 0   atorvastatin (LIPITOR) 40 MG tablet Take 1 tablet (40 mg total) by mouth daily. (Patient not taking: Reported on 04/17/2023) 30 tablet 2   hydrOXYzine (ATARAX) 25 MG tablet Take 1 tablet by mouth at bedtime.     ixazomib citrate (NINLARO) 4 MG capsule Take 1 capsule (4 mg) by mouth weekly, 3 weeks on, 1 week off, repeat every 4 weeks. Take on an empty stomach 1hr before or 2hr after meals. 3 capsule 0   lenalidomide (REVLIMID) 5 MG capsule Take 1 capsule (5 mg total) by mouth daily. Take for 14 days,  then  hold for 7 days. Repeat every 21 days. 14 capsule 0   levothyroxine (SYNTHROID, LEVOTHROID) 50 MCG tablet Take 50 mcg by mouth daily before breakfast.     metoCLOPramide (REGLAN) 10 MG tablet Take 1 tablet (10 mg total) by mouth every 8 (eight) hours as needed for up to 10 doses (hiccups). Take one pill 1 hour prior to taking Decadron. 10 tablet 0   pantoprazole (PROTONIX) 40 MG tablet Take 40 mg by mouth daily as needed.     pravastatin (PRAVACHOL) 10 MG tablet Take 1 tablet by mouth at bedtime.     Probiotic Product (PROBIOTIC ADVANCED PO) Take 1 tablet by mouth at bedtime.     senna (SENOKOT) 8.6 MG tablet Take 2 tablets (17.2 mg total) by mouth 2 (two) times daily. May crush, mix with water and give sublingually if needed. 28 tablet 0   sulfamethoxazole-trimethoprim (BACTRIM DS) 800-160 MG tablet Take 1 tablet by mouth every 12 (twelve) hours. 60 tablet 0   traZODone (DESYREL) 50 MG tablet Take by mouth.     trimethoprim (TRIMPEX) 100 MG tablet Take 1 tablet (100 mg total) by mouth daily. 90 tablet 0   zolpidem (AMBIEN) 5 MG tablet TAKE 1 TABLET BY MOUTH EVERY DAY AT BEDTIME AS NEEDED FOR SLEEP 30 tablet 0   No current facility-administered medications for this visit.    REVIEW OF SYSTEMS:   Pertinent information mentioned in HPI All other systems were reviewed with the patient and are negative.  PHYSICAL EXAMINATION: ECOG PERFORMANCE STATUS: 1 - Symptomatic but completely ambulatory  Vitals:   06/30/23 1353  BP: (!) 109/56  Pulse: 66  Resp: 16  Temp: 98.6 F (37 C)  SpO2: 100%      Filed Weights   06/30/23 1353  Weight: 140 lb (63.5 kg)       GENERAL:alert, no distress and comfortable SKIN: skin color, texture, turgor are normal, no rashes or significant lesions EYES: normal, conjunctiva are pink and non-injected, sclera clear OROPHARYNX:no exudate, no erythema and lips, buccal mucosa, and tongue normal  NECK: supple, thyroid normal size, non-tender, without  nodularity LYMPH:  no palpable lymphadenopathy in the cervical, axillary or inguinal LUNGS: clear to auscultation and percussion with normal breathing effort HEART: regular rate & rhythm and no murmurs and no lower extremity edema ABDOMEN:abdomen soft, non-tender and normal bowel sounds Musculoskeletal:no cyanosis of digits and no clubbing  PSYCH: alert & oriented x 3 with fluent speech NEURO: no focal motor/sensory deficits  LABORATORY DATA:  I have reviewed the data as listed Lab Results  Component Value Date   WBC 3.2 (L) 06/30/2023   HGB 11.8 (L) 06/30/2023   HCT 34.2 (L) 06/30/2023   MCV 99.7 06/30/2023   PLT 136 (L) 06/30/2023   Recent Labs    06/18/23 1339 06/23/23 0912 06/30/23 1328  NA 131* 131* 130*  K 3.9 4.2 3.9  CL 97* 97* 97*  CO2 24 25 24   GLUCOSE 93 99 88  BUN 15 12 12   CREATININE 0.92 1.14 0.84  CALCIUM 8.6* 8.2* 8.6*  GFRNONAA >60 >60 >60  PROT 7.3 6.6 6.6  ALBUMIN 3.6 3.1* 3.6  AST 22 21 34  ALT 20 18 32  ALKPHOS 66 67 80  BILITOT 1.0 0.9 0.7    RADIOGRAPHIC STUDIES: I have personally reviewed the radiological images as listed and agreed with the findings in the report. MR Cervical Spine W Wo Contrast  Result Date: 06/30/2023 CLINICAL DATA:  Worsening back pain with history  of myeloma. EXAM: MRI CERVICAL, THORACIC AND LUMBAR SPINE WITHOUT AND WITH CONTRAST TECHNIQUE: Multiplanar and multiecho pulse sequences of the cervical spine, to include the craniocervical junction and cervicothoracic junction, and thoracic and lumbar spine, were obtained without and with intravenous contrast. CONTRAST:  6mL GADAVIST GADOBUTROL 1 MMOL/ML IV SOLN COMPARISON:  01/26/2023 FINDINGS: MRI CERVICAL SPINE FINDINGS Alignment: Mild retrolisthesis at C2-3 with anterolisthesis at C4-5 and C7-T1. Vertebrae: Extensive heterogeneity of the skull base and cervical marrow, a change from prior with extensive nodular enhancement. Findings consistent with progression of disease. No  superimposed fracture or infection. Cord: Normal signal and morphology. Posterior Fossa, vertebral arteries, paraspinal tissues: No evidence of soft tissue mass. Disc levels: C2-3: Disc narrowing with retrolisthesis and endplate/uncovertebral ridging. Facet spurring on both sides. Moderate biforaminal narrowing. Spinal stenosis causes mild cord mass effect C3-4: Disc narrowing with uncovertebral and facet spurring eccentric to the right where there is foraminal impingement. Bulging disc with ridging and ligamentum flavum thickening causes mild spinal stenosis. C4-5: Degenerative facet spurring asymmetric to the right. Asymmetric right uncovertebral ridging. Right foraminal impingement. C5-6: Disc narrowing and bulging with uncovertebral ridging and biforaminal impingement. C6-7: Disc narrowing and bulging with uncovertebral ridging. Left more than right foraminal impingement C7-T1:Facet spurring and disc space narrowing without impingement. MRI THORACIC SPINE FINDINGS Alignment:  Physiologic. Vertebrae: Innumerable nodular areas of STIR hyperintensity and enhancement throughout the thoracic spine and covered ribs. Regressed/resolved marrow edema associated with previously seen compression fractures at T8, T9, T11, T12, and L1. No acute fracture. No evidence of soft tissue tumor extension. Cord:  Normal signal and morphology Paraspinal and other soft tissues: No evidence of perispinal mass or inflammation. Disc levels: Generalized disc narrowing and desiccation with bulging. Degenerative facet spurring at T9-10 and T10-11. Moderate bilateral foraminal stenosis at T9-10 and T10-11. The spinal canal is diffusely patent. MRI LUMBAR SPINE FINDINGS Segmentation:  Standard. Alignment:  Physiologic. Vertebrae: Innumerable STIR hyperintense lesions throughout the covered skeleton, consistent with myeloma. Resolved marrow edema at L1 and L3 compression fractures seen on prior. No acute fracture. No evidence of extraosseous  tumor extension. Conus medullaris and cauda equina: Conus extends to the L1 level. Conus and cauda equina appear normal. Paraspinal and other soft tissues: No perispinal mass or inflammation detected. Disc levels: L1-L2: Mild facet spurring and disc bulging L2-L3: Disc narrowing and bulging with endplate and facet spurring. Moderate spinal stenosis. Patent foramina L3-L4: Disc narrowing and bulging with degenerative facet spurring and ligamentum flavum thickening. Mild to moderate spinal stenosis. Mild right foraminal narrowing L4-L5: Disc narrowing and bulging with endplate and facet spurring eccentric to the left. Moderate narrowing of the thecal sac. Mild left foraminal stenosis. L5-S1:Degenerative facet spurring. Circumferential disc bulging. No neural compression. IMPRESSION: 1. Progression of multiple myeloma with innumerable lesions throughout the covered skeleton since 01/26/2023 comparison. 2. Compression fractures seen on prior have healed. No acute fracture. No soft tissue tumor extension. 3. Degenerative changes are stable from prior and described above. 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 Electronically Signed   By: Tiburcio Pea M.D.   On: 06/30/2023 09:53   MR Thoracic Spine W Wo Contrast  Result Date: 06/30/2023 CLINICAL DATA:  Worsening back pain with history of myeloma. EXAM: MRI CERVICAL, THORACIC AND LUMBAR SPINE WITHOUT AND WITH CONTRAST TECHNIQUE: Multiplanar and multiecho pulse sequences of the cervical spine, to include the craniocervical junction and cervicothoracic junction, and thoracic and lumbar spine, were obtained without and  with intravenous contrast. CONTRAST:  6mL GADAVIST GADOBUTROL 1 MMOL/ML IV SOLN COMPARISON:  01/26/2023 FINDINGS: MRI CERVICAL SPINE FINDINGS Alignment: Mild retrolisthesis at C2-3 with anterolisthesis at C4-5 and C7-T1. Vertebrae: Extensive heterogeneity of the skull base and  cervical marrow, a change from prior with extensive nodular enhancement. Findings consistent with progression of disease. No superimposed fracture or infection. Cord: Normal signal and morphology. Posterior Fossa, vertebral arteries, paraspinal tissues: No evidence of soft tissue mass. Disc levels: C2-3: Disc narrowing with retrolisthesis and endplate/uncovertebral ridging. Facet spurring on both sides. Moderate biforaminal narrowing. Spinal stenosis causes mild cord mass effect C3-4: Disc narrowing with uncovertebral and facet spurring eccentric to the right where there is foraminal impingement. Bulging disc with ridging and ligamentum flavum thickening causes mild spinal stenosis. C4-5: Degenerative facet spurring asymmetric to the right. Asymmetric right uncovertebral ridging. Right foraminal impingement. C5-6: Disc narrowing and bulging with uncovertebral ridging and biforaminal impingement. C6-7: Disc narrowing and bulging with uncovertebral ridging. Left more than right foraminal impingement C7-T1:Facet spurring and disc space narrowing without impingement. MRI THORACIC SPINE FINDINGS Alignment:  Physiologic. Vertebrae: Innumerable nodular areas of STIR hyperintensity and enhancement throughout the thoracic spine and covered ribs. Regressed/resolved marrow edema associated with previously seen compression fractures at T8, T9, T11, T12, and L1. No acute fracture. No evidence of soft tissue tumor extension. Cord:  Normal signal and morphology Paraspinal and other soft tissues: No evidence of perispinal mass or inflammation. Disc levels: Generalized disc narrowing and desiccation with bulging. Degenerative facet spurring at T9-10 and T10-11. Moderate bilateral foraminal stenosis at T9-10 and T10-11. The spinal canal is diffusely patent. MRI LUMBAR SPINE FINDINGS Segmentation:  Standard. Alignment:  Physiologic. Vertebrae: Innumerable STIR hyperintense lesions throughout the covered skeleton, consistent with  myeloma. Resolved marrow edema at L1 and L3 compression fractures seen on prior. No acute fracture. No evidence of extraosseous tumor extension. Conus medullaris and cauda equina: Conus extends to the L1 level. Conus and cauda equina appear normal. Paraspinal and other soft tissues: No perispinal mass or inflammation detected. Disc levels: L1-L2: Mild facet spurring and disc bulging L2-L3: Disc narrowing and bulging with endplate and facet spurring. Moderate spinal stenosis. Patent foramina L3-L4: Disc narrowing and bulging with degenerative facet spurring and ligamentum flavum thickening. Mild to moderate spinal stenosis. Mild right foraminal narrowing L4-L5: Disc narrowing and bulging with endplate and facet spurring eccentric to the left. Moderate narrowing of the thecal sac. Mild left foraminal stenosis. L5-S1:Degenerative facet spurring. Circumferential disc bulging. No neural compression. IMPRESSION: 1. Progression of multiple myeloma with innumerable lesions throughout the covered skeleton since 01/26/2023 comparison. 2. Compression fractures seen on prior have healed. No acute fracture. No soft tissue tumor extension. 3. Degenerative changes are stable from prior and described above. 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 Electronically Signed   By: Tiburcio Pea M.D.   On: 06/30/2023 09:53   MR Lumbar Spine W Wo Contrast  Result Date: 06/30/2023 CLINICAL DATA:  Worsening back pain with history of myeloma. EXAM: MRI CERVICAL, THORACIC AND LUMBAR SPINE WITHOUT AND WITH CONTRAST TECHNIQUE: Multiplanar and multiecho pulse sequences of the cervical spine, to include the craniocervical junction and cervicothoracic junction, and thoracic and lumbar spine, were obtained without and with intravenous contrast. CONTRAST:  6mL GADAVIST GADOBUTROL 1 MMOL/ML IV SOLN COMPARISON:  01/26/2023 FINDINGS: MRI CERVICAL SPINE FINDINGS Alignment: Mild  retrolisthesis at C2-3 with anterolisthesis at C4-5 and C7-T1. Vertebrae: Extensive heterogeneity of the skull base and  cervical marrow, a change from prior with extensive nodular enhancement. Findings consistent with progression of disease. No superimposed fracture or infection. Cord: Normal signal and morphology. Posterior Fossa, vertebral arteries, paraspinal tissues: No evidence of soft tissue mass. Disc levels: C2-3: Disc narrowing with retrolisthesis and endplate/uncovertebral ridging. Facet spurring on both sides. Moderate biforaminal narrowing. Spinal stenosis causes mild cord mass effect C3-4: Disc narrowing with uncovertebral and facet spurring eccentric to the right where there is foraminal impingement. Bulging disc with ridging and ligamentum flavum thickening causes mild spinal stenosis. C4-5: Degenerative facet spurring asymmetric to the right. Asymmetric right uncovertebral ridging. Right foraminal impingement. C5-6: Disc narrowing and bulging with uncovertebral ridging and biforaminal impingement. C6-7: Disc narrowing and bulging with uncovertebral ridging. Left more than right foraminal impingement C7-T1:Facet spurring and disc space narrowing without impingement. MRI THORACIC SPINE FINDINGS Alignment:  Physiologic. Vertebrae: Innumerable nodular areas of STIR hyperintensity and enhancement throughout the thoracic spine and covered ribs. Regressed/resolved marrow edema associated with previously seen compression fractures at T8, T9, T11, T12, and L1. No acute fracture. No evidence of soft tissue tumor extension. Cord:  Normal signal and morphology Paraspinal and other soft tissues: No evidence of perispinal mass or inflammation. Disc levels: Generalized disc narrowing and desiccation with bulging. Degenerative facet spurring at T9-10 and T10-11. Moderate bilateral foraminal stenosis at T9-10 and T10-11. The spinal canal is diffusely patent. MRI LUMBAR SPINE FINDINGS Segmentation:  Standard.  Alignment:  Physiologic. Vertebrae: Innumerable STIR hyperintense lesions throughout the covered skeleton, consistent with myeloma. Resolved marrow edema at L1 and L3 compression fractures seen on prior. No acute fracture. No evidence of extraosseous tumor extension. Conus medullaris and cauda equina: Conus extends to the L1 level. Conus and cauda equina appear normal. Paraspinal and other soft tissues: No perispinal mass or inflammation detected. Disc levels: L1-L2: Mild facet spurring and disc bulging L2-L3: Disc narrowing and bulging with endplate and facet spurring. Moderate spinal stenosis. Patent foramina L3-L4: Disc narrowing and bulging with degenerative facet spurring and ligamentum flavum thickening. Mild to moderate spinal stenosis. Mild right foraminal narrowing L4-L5: Disc narrowing and bulging with endplate and facet spurring eccentric to the left. Moderate narrowing of the thecal sac. Mild left foraminal stenosis. L5-S1:Degenerative facet spurring. Circumferential disc bulging. No neural compression. IMPRESSION: 1. Progression of multiple myeloma with innumerable lesions throughout the covered skeleton since 01/26/2023 comparison. 2. Compression fractures seen on prior have healed. No acute fracture. No soft tissue tumor extension. 3. Degenerative changes are stable from prior and described above. 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 Electronically Signed   By: Tiburcio Pea M.D.   On: 06/30/2023 09:53   US RENAL  Result Date: 06/11/2023 CLINICAL DATA:  Gross hematuria EXAM: RENAL / URINARY TRACT ULTRASOUND COMPLETE COMPARISON:  CT abdomen pelvis 01/26/2023 FINDINGS: Right Kidney: Renal measurements: 10.7 x 4.6 x 5.1 cm = volume: 131 mL. Echogenicity within normal limits. No mass or hydronephrosis visualized. There is a 2.6 cm cyst. No imaging follow-up needed. Left Kidney: Renal measurements: 10.2 x 5.5 x 5.6 cm =  volume: 165 mL. Echogenicity within normal limits. No mass or hydronephrosis visualized. Bladder: Appears normal for degree of bladder distention. Other: None. IMPRESSION: No hydronephrosis. Electronically Signed   By: Annia Belt M.D.   On: 06/11/2023 15:58

## 2023-07-02 ENCOUNTER — Other Ambulatory Visit: Payer: Self-pay

## 2023-07-02 ENCOUNTER — Other Ambulatory Visit: Payer: Self-pay | Admitting: *Deleted

## 2023-07-02 ENCOUNTER — Encounter: Payer: Self-pay | Admitting: Urology

## 2023-07-02 ENCOUNTER — Ambulatory Visit: Payer: Medicare HMO | Admitting: Urology

## 2023-07-02 VITALS — BP 138/67 | HR 80 | Ht 71.0 in | Wt 140.0 lb

## 2023-07-02 DIAGNOSIS — N390 Urinary tract infection, site not specified: Secondary | ICD-10-CM

## 2023-07-02 DIAGNOSIS — R310 Gross hematuria: Secondary | ICD-10-CM

## 2023-07-02 DIAGNOSIS — C61 Malignant neoplasm of prostate: Secondary | ICD-10-CM | POA: Diagnosis not present

## 2023-07-02 DIAGNOSIS — Z87440 Personal history of urinary (tract) infections: Secondary | ICD-10-CM | POA: Diagnosis not present

## 2023-07-02 DIAGNOSIS — Z87898 Personal history of other specified conditions: Secondary | ICD-10-CM

## 2023-07-02 DIAGNOSIS — R339 Retention of urine, unspecified: Secondary | ICD-10-CM

## 2023-07-02 NOTE — Telephone Encounter (Signed)
Patient successfully OnBo[employer] and drug education provided by pharmacist. Medication scheduled to be shipped on Thursday, 07/03/23, for delivery on Friday, 07/04/23, from [facility] to patient's address. Patient also knows to call me at [phone] with any questions or concerns regarding receiving medication or if there is any unexpected change in co-pay.    Ardeen Fillers, CPhT Oncology Pharmacy Patient Advocate  [facility]  [phone] (phone) [phone] (fax) 07/02/2023 9:09 AM

## 2023-07-02 NOTE — Progress Notes (Signed)
Specialty Pharmacy Initial Fill Coordination Note  Melvin Gilmore is a 82 y.o. male contacted today regarding initial fill of specialty medication(s) Ixazomib Citrate   Patient requested Delivery   Delivery date: 07/04/23   Verified address: [address], [place], [place] [address]  Medication will be filled on 07/03/23.   Patient is aware of $0.00 copayment.    Ardeen Fillers, CPhT Oncology Pharmacy Patient Advocate  [facility]  [phone] (phone) [phone] (fax) 07/02/2023 9:08 AM

## 2023-07-02 NOTE — Telephone Encounter (Signed)
Clinical Pharmacist Practitioner Encounter   Patient will start his Revlimid and Ninlaro on 07/04/23.  Patient Education I spoke with patient's wife  Robinfor overview of new oral chemotherapy medication: Ninlaro (ixazomib) for the treatment of progressive multiple myeloma in conjunction with lenalidomide and dexamethasone, planned duration until disease control or unacceptable drug toxicity.   Counseled patient on administration, dosing, side effects, monitoring, drug-food interactions, safe handling, storage, and disposal. Patient will take 1 capsule (4 mg) by mouth weekly, 3 weeks on, 1 week off, repeat every 4 weeks. Take on an empty stomach 1hr before or 2hr after meals.   Side effects include but not limited to: diarrhea or constipation, nausea, small chance of peripheral neuropathy, decrease wbc/plt.    Reviewed with patient importance of keeping a medication schedule and plan for any missed doses.  After discussion with patient no patient barriers to medication adherence identified.   Ms. Lanigan voiced understanding and appreciation. All questions answered. Medication handout provided.  Provided patient with [facility] phone number. Patient knows to call the office with questions or concerns. [facility] will continue to follow.  Remi Haggard, PharmD, BCPS, BCOP, CPP Hematology/Oncology Clinical Pharmacist Practitioner /[facility] [phone]  07/02/2023 8:57 AM

## 2023-07-02 NOTE — Progress Notes (Signed)
Patient education documented in EPIC note on 07/02/23.

## 2023-07-03 ENCOUNTER — Encounter: Payer: Self-pay | Admitting: Internal Medicine

## 2023-07-03 ENCOUNTER — Other Ambulatory Visit: Payer: Self-pay

## 2023-07-03 LAB — PSA: Prostate Specific Ag, Serum: 4.1 ng/mL — ABNORMAL HIGH (ref 0.0–4.0)

## 2023-07-03 MED ORDER — ZOLPIDEM TARTRATE 5 MG PO TABS: 5.0000 mg | ORAL_TABLET | Freq: Every evening | ORAL | 0 refills | Status: DC | PRN

## 2023-07-07 ENCOUNTER — Other Ambulatory Visit: Payer: Medicare HMO

## 2023-07-07 ENCOUNTER — Ambulatory Visit: Payer: Medicare HMO

## 2023-07-08 LAB — MULTIPLE MYELOMA PANEL, SERUM
Albumin SerPl Elph-Mcnc: 3.3 g/dL (ref 2.9–4.4)
Albumin/Glob SerPl: 1.2 (ref 0.7–1.7)
Alpha 1: 0.4 g/dL (ref 0.0–0.4)
Alpha2 Glob SerPl Elph-Mcnc: 0.6 g/dL (ref 0.4–1.0)
B-Globulin SerPl Elph-Mcnc: 0.8 g/dL (ref 0.7–1.3)
Gamma Glob SerPl Elph-Mcnc: 1 g/dL (ref 0.4–1.8)
Globulin, Total: 2.9 g/dL (ref 2.2–3.9)
IgA: 655 mg/dL — ABNORMAL HIGH (ref 61–437)
IgG (Immunoglobin G), Serum: 496 mg/dL — ABNORMAL LOW (ref 603–1613)
IgM (Immunoglobulin M), Srm: 10 mg/dL — ABNORMAL LOW (ref 15–143)
M Protein SerPl Elph-Mcnc: 0.6 g/dL — ABNORMAL HIGH
Total Protein ELP: 6.2 g/dL (ref 6.0–8.5)

## 2023-07-11 ENCOUNTER — Ambulatory Visit: Payer: Medicare HMO | Admitting: Internal Medicine

## 2023-07-11 ENCOUNTER — Inpatient Hospital Stay: Payer: Medicare HMO | Admitting: Internal Medicine

## 2023-07-11 ENCOUNTER — Inpatient Hospital Stay: Payer: Medicare HMO

## 2023-07-11 ENCOUNTER — Other Ambulatory Visit: Payer: Medicare HMO

## 2023-07-11 ENCOUNTER — Encounter: Payer: Self-pay | Admitting: Internal Medicine

## 2023-07-11 VITALS — BP 116/68 | HR 68 | Temp 98.7°F | Resp 18 | Wt 142.0 lb

## 2023-07-11 DIAGNOSIS — C9000 Multiple myeloma not having achieved remission: Secondary | ICD-10-CM

## 2023-07-11 DIAGNOSIS — Z5112 Encounter for antineoplastic immunotherapy: Secondary | ICD-10-CM | POA: Diagnosis not present

## 2023-07-11 LAB — CBC WITH DIFFERENTIAL/PLATELET
Abs Immature Granulocytes: 0.02 10*3/uL (ref 0.00–0.07)
Basophils Absolute: 0 10*3/uL (ref 0.0–0.1)
Basophils Relative: 0 %
Eosinophils Absolute: 0.1 10*3/uL (ref 0.0–0.5)
Eosinophils Relative: 1 %
HCT: 35.6 % — ABNORMAL LOW (ref 39.0–52.0)
Hemoglobin: 12.1 g/dL — ABNORMAL LOW (ref 13.0–17.0)
Immature Granulocytes: 1 %
Lymphocytes Relative: 28 %
Lymphs Abs: 1.2 10*3/uL (ref 0.7–4.0)
MCH: 34.8 pg — ABNORMAL HIGH (ref 26.0–34.0)
MCHC: 34 g/dL (ref 30.0–36.0)
MCV: 102.3 fL — ABNORMAL HIGH (ref 80.0–100.0)
Monocytes Absolute: 0.4 10*3/uL (ref 0.1–1.0)
Monocytes Relative: 10 %
Neutro Abs: 2.6 10*3/uL (ref 1.7–7.7)
Neutrophils Relative %: 60 %
Platelets: 193 10*3/uL (ref 150–400)
RBC: 3.48 MIL/uL — ABNORMAL LOW (ref 4.22–5.81)
RDW: 15.9 % — ABNORMAL HIGH (ref 11.5–15.5)
WBC: 4.4 10*3/uL (ref 4.0–10.5)
nRBC: 0 % (ref 0.0–0.2)

## 2023-07-11 LAB — COMPREHENSIVE METABOLIC PANEL
ALT: 23 U/L (ref 0–44)
AST: 22 U/L (ref 15–41)
Albumin: 3.4 g/dL — ABNORMAL LOW (ref 3.5–5.0)
Alkaline Phosphatase: 85 U/L (ref 38–126)
Anion gap: 9 (ref 5–15)
BUN: 15 mg/dL (ref 8–23)
CO2: 25 mmol/L (ref 22–32)
Calcium: 8.5 mg/dL — ABNORMAL LOW (ref 8.9–10.3)
Chloride: 96 mmol/L — ABNORMAL LOW (ref 98–111)
Creatinine, Ser: 0.97 mg/dL (ref 0.61–1.24)
GFR, Estimated: >60 mL/min (ref >60)
Glucose, Bld: 91 mg/dL (ref 70–99)
Potassium: 4.1 mmol/L (ref 3.5–5.1)
Sodium: 130 mmol/L — ABNORMAL LOW (ref 135–145)
Total Bilirubin: 0.5 mg/dL (ref <1.2)
Total Protein: 6.2 g/dL — ABNORMAL LOW (ref 6.5–8.1)

## 2023-07-11 NOTE — Progress Notes (Signed)
Does patient need to take the dexamethasone today? Patient is doing really good, no bad side effects form the new medication. He is up doing things around the house more, appetite is good, and he is not as fatigued.

## 2023-07-11 NOTE — Progress Notes (Signed)
[facility] CONSULT NOTE  Patient Care Team: [name], MD as PCP - General (Internal Medicine) [name], MD as Consulting Physician (Oncology)  CANCER STAGING   Cancer Staging  Multiple myeloma [facility]) Staging form: Plasma Cell Myeloma and Plasma Cell Disorders, AJCC 8th Edition - Clinical stage from 12/05/2022: RISS Stage II (Beta-2-microglobulin (mg/L): 3.7, Albumin (g/dL): 3.6, ISS: Stage II, High-risk cytogenetics: Absent, LDH: Normal) - Signed by [name], MD on 12/26/2022 Stage prefix: Initial diagnosis Beta 2 microglobulin range (mg/L): 3.5 to 5.49 Albumin range (g/dL): Greater than or equal to 3.5 Cytogenetics: 1q addition, Other mutation   ASSESSMENT & PLAN:  Melvin Gilmore 82 y.o. male with pmh of hypertension, hyperlipidemia, anxiety, GERD, BPH, hypothyroidism, prostate cancer s/p HIFU was seen by primary on November 13, 2022 for acute onset of lower back pain follows with medical oncology for management of multiple myeloma.  # Multiple myeloma, RISS Stage II, high risk -MRI thoracic and lumbar spine with and without contrast was reviewed. Showed unchanged subacute compression fracture at T12.  Focal enhancing marrow replacing lesions in T6, T8, T5, T2 all suspicious for metastatic disease.  L1 lesion measuring 16 mm.  Multilevel degenerative changes present.  -SPEP/IFE showed biclonal IgA kappa paraprotein with M spike 2.1 g/dL and second spike at 0.4 g/dL.  Kappa 453, lambda 6.8 with a ratio of 66.7.  Iron panel and B12 are normal.  PSA 3.44.  CBC showed mild anemia 13.4, chronic.  On CMP normal creatinine and calcium levels.  Elevated total protein 8.8.  LDH normal.  Beta-2 microglobulin 3.7.  Albumin 3.6.  24-hour urine/UPEP showed 1.1 g protein with no M spike.  -BMBx Hypercellular marrow with 60% plasma cells.  IHC positive for CD138/ mum 1.  Kappa restricted consistent with multiple myeloma. L1 vertebral body biopsy showed numerous plasma  cells. Cytogenetics - del (13q) and gain 1.    -PET/CT showed activity in L1 and left eighth rib concerning for myeloma.  -Started on Dara-RD on 12/26/2022.  Completed cycle 1 of daratumumab ->hospitalized for urinary retention, constipation and back pain -> inpatient hospitalist transition him to hospice -> patient revoked hospice on 01/31/2023 since he was feeling better.  Multiple treatment interruptions due to UTIs and hospitalization for stroke.  -Completed cycle 5-day 1 of daratumumab on 06/09/2023.  Progressed with increase in kappa light chain from 48 to 700 ratio 188.  Changed to VRD on 06/20/2023.  With C1D1 of Velcade he had rigors.  Premedicated with Tylenol and Benadryl with C1D8.  4 hours postinfusion had severe rigors, hallucinations, weakness.  Patient never wants to go back on Velcade.  -Started on Ixazomib 4 mg once weekly 3 weeks on and 1 week off on 07/04/2023.  Tolerating well.  Continue with Revlimid 5 mg 2 weeks on 2 weeks off.  Previously had issues with neutropenia with 3-week schedule.  Today his blood work is looking good with normal ANC.  I am thinking his neutropenia may have come from radiation treatment which he was getting at that time.  With the next cycle, I plan to increase Revlimid to 3 weeks on 1 week of regimen.  Continue with Dex 2 mg for 4 days weekly 3-week on 1 week off.  Cannot tolerate higher dose due to intractable hiccups.  -Multiple myeloma panel reviewed.  Kappa light chains have decreased from 773 to 174 with improvement in ratio from 188-36.  -Previously briefly treated with Revlimid 10 mg did not tolerate due to  neutropenia. Then developed stroke in August 2024 (although less likely related to Revlimid since he was off it for 3 weeks recovering from neutropenia).  We discussed about risk versus benefit and patient is agreeable to proceed.  Continue with aspirin 81 mg daily.    # Recurrent UTIs -Follows with Dr. Lonna Cobb.  On trimethoprim.    # Stroke -  Admitted 03/07/2023 with acute onset aphasia and right hemianopia. s/p tPA with complete resolution of symptoms.  MRI brain negative.  Echo unremarkable.  On aspirin and Plavix for 21 days then aspirin alone.  On Lipitor 40 mg daily.    # Intractable hiccups -Secondary to dexamethasone.  # Vertebral compression fractures -Dental clearance obtained. - IV Zometa 3 mg (on 06/09/2023).  Next dose due in 3 months.  # T12 pathological fracture # Cancer-related pain -s/p kyphoplasty by Dr. Elijio Miles on 12/06/22.   -Completed palliative RT on 01/20/2023.  -MRI spine performed From 06/27/2023 shows progression of multiple myeloma with innumerable lesions throughout the covered skeleton since July 2024.  Compression fractures seen on prior have healed.  No acute fracture.  Patient inquiring further role for kyphoplasty which I think is limited with no new acute fractures.  Continue with brace.  Can use hemp cream OTC.  Continues to decline oral opioids.  Continue with Tylenol.  # Constipation -Manageable with senna, MiraLAX, lactulose, Movantik as needed.  # History of prostate cancer - follows with Dr. Evelene Croon, urology for elevated PSA.  MRI prostate showed 1 mm category 5 lesion of anterior transition zone.  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 s/p HIFU of the prostate on 02/07/2022.  PSA level from 10/08/2022 was 3.34.  -PSA from 11/21/2022 3.44.  # Anxiety -He is taking Ambien 5 mg nightly prescribed by PCP.  Understands the risk of increased falls/confusion.  t really helps him with sleep and anxiety so he would like to continue it.  Orders Placed This Encounter  Procedures   CBC with Differential (Cancer Center Only)    Standing Status:   Future    Expected Date:   07/18/2023    Expiration Date:   07/10/2024   CMP (Cancer Center only)    Standing Status:   Future    Expected Date:   07/18/2023    Expiration Date:   07/10/2024   CBC with  Differential (Cancer Center Only)    Standing Status:   Future    Expected Date:   08/08/2023    Expiration Date:   07/10/2024   CMP (Cancer Center only)    Standing Status:   Future    Expected Date:   08/08/2023    Expiration Date:   07/10/2024   Kappa/lambda light chains    Standing Status:   Future    Expected Date:   08/01/2023    Expiration Date:   07/10/2024   Multiple Myeloma Panel (SPEP&IFE w/QIG)    Standing Status:   Future    Expected Date:   08/01/2023    Expiration Date:   07/10/2024   RTC in 1 week for CBC/CMP RTC in 3-week for CBC/CMP/myeloma panel RTC in 4 weeks for MD visit and labs.  The total time spent in the appointment was 30 minutes encounter with patients including review of chart and various tests results, discussions about plan of care and coordination of care plan   All questions were answered. The patient knows to call the clinic with any problems, questions or  concerns. No barriers to learning was detected.  [name], MD 12/20/20242:38 PM   HISTORY OF PRESENTING ILLNESS:  Melvin Gilmore 82 y.o. male with past medical history of hypertension, hyperlipidemia, anxiety, GERD, BPH, hypothyroidism, prostate cancer s/p HIFU was seen by primary on November 13, 2022 for acute onset of lower back pain.  Patient reports that he was sitting outside and had violent sneeze which was followed by severe back pain.  He has fallen allergies.  Had x-rays followed by MRI thoracic spine without contrast.  It showed compression fracture of T12 superior endplate with surrounding marrow edema likely subacute.  15 mm T1 hypointense marrow lesion in the anterior aspect of L1 suspicious for metastasis.  Further characterization with postcontrast MRI recommended.  Prostate cancer-follows with Dr. Evelene Croon, urology for elevated PSA.  MRI prostate showed 1 mm category 5 lesion of anterior transition zone.  Underwent fusion biopsy showed Gleason score 3+4 adenocarcinoma involving left  anterior 4/4 cores. s/p HIFU of the prostate on 02/07/2022.  PSA level from 10/08/2022 was 3.34.  Interval history Patient was seen today accompanied with his wife on cycle 1 day 8 of Ixazomib, Revlimid and Dex. He is tolerating the new regimen well.  Denies any side effects.  His back pain is stable.  Worsened when he straightens up his back and is on his feet for a long time.  Continue with Tylenol and brace.  Does not want to be on opioids.  I have reviewed his chart and materials related to his cancer extensively and collaborated history with the patient. Summary of oncologic history is as follows: Oncology History  Multiple myeloma (HCC)  12/05/2022 Cancer Staging   Staging form: Plasma Cell Myeloma and Plasma Cell Disorders, AJCC 8th Edition - Clinical stage from 12/05/2022: RISS Stage II (Beta-2-microglobulin (mg/L): 3.7, Albumin (g/dL): 3.6, ISS: Stage II, High-risk cytogenetics: Absent, LDH: Normal) - Signed by [name], MD on 12/26/2022 Stage prefix: Initial diagnosis Beta 2 microglobulin range (mg/L): 3.5 to 5.49 Albumin range (g/dL): Greater than or equal to 3.5 Cytogenetics: 1q addition, Other mutation   12/13/2022 Initial Diagnosis   Multiple myeloma (HCC)   12/26/2022 - 01/24/2023 Chemotherapy   Patient is on Treatment Plan : MYELOMA NEWLY DIAGNOSED Daratumumab IV + Lenalidomide + Dexamethasone Weekly (DaraRd) q28d     02/07/2023 - 02/07/2023 Chemotherapy   Patient is on Treatment Plan : MYELOMA NON-TRANSPLANT CANDIDATES VRd weekly q21d     02/13/2023 - 06/09/2023 Chemotherapy   Patient is on Treatment Plan : MYELOMA NEWLY DIAGNOSED Daratumumab IV + Lenalidomide + Dexamethasone Weekly (DaraRd) q28d     06/13/2023 - 06/23/2023 Chemotherapy   Patient is on Treatment Plan : MYELOMA  RVD SQ q21d x 4 cycles       MEDICAL HISTORY:  Past Medical History:  Diagnosis Date   Anxiety    a.) on BZO (alprazolam) PRN   Cervicalgia    Chest pain, non-cardiac    Elevated PSA     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   Distal with moderate free air surrounding Hiatal Hernia   Gallbladder polyp    Gastritis    GERD (gastroesophageal reflux disease)    History of hiatal hernia    HLD (hyperlipidemia)    HTN (hypertension)    Hypothyroidism    Meniere disease    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   Nausea vomiting and diarrhea 01/26/2023   Prostate cancer (HCC) 2023   Seasonal allergies    Skin cancer  a.) ears   Wears partial dentures    lower    SURGICAL HISTORY: Past Surgical History:  Procedure Laterality Date   APPENDECTOMY     CATARACT EXTRACTION W/PHACO Right 01/15/2016   Procedure: CATARACT EXTRACTION PHACO AND INTRAOCULAR LENS PLACEMENT (IOC);  Surgeon: Sherald Hess, MD;  Location: [facility];  Service: Ophthalmology;  Laterality: Right;  RIGHT CALL CELL PHONE WITH TIME   CHOLECYSTECTOMY     COLONOSCOPY     COLONOSCOPY WITH ESOPHAGOGASTRODUODENOSCOPY (EGD)     ESOPHAGOGASTRODUODENOSCOPY  07/2021   ESOPHAGOGASTRODUODENOSCOPY (EGD) WITH PROPOFOL N/A 09/23/2017   Procedure: ESOPHAGOGASTRODUODENOSCOPY (EGD) WITH PROPOFOL;  Surgeon: Toledo, Boykin Nearing, MD;  Location: [facility];  Service: Gastroenterology;  Laterality: N/A;   HIGH INTENSITY FOCUSED ULTRASOUND (HIFU) OF THE PROSTATE N/A 02/07/2022   Procedure: HIGH INTENSITY FOCUSED ULTRASOUND (HIFU) OF THE PROSTATE;  Surgeon: Orson Ape, MD;  Location: [facility];  Service: Urology;  Laterality: N/A;   HYDROCELE EXCISION / REPAIR     IR KYPHO THORACIC WITH BONE BIOPSY  12/06/2022   IR RADIOLOGIST EVAL & MGMT  11/21/2022   LASIX RT EYE     PROSTATE BIOPSY N/A 01/03/2022   Procedure: PROSTATE BIOPSY  Addison Hisle;  Surgeon: Orson Ape, MD;  Location: [facility];  Service: Urology;  Laterality: N/A;   ROTATOR CUFF REPAIR Left 2001    SOCIAL HISTORY: Social History   Socioeconomic History   Marital status: Married    Spouse name: Not on file   Number of children:  Not on file   Years of education: Not on file   Highest education level: Not on file  Occupational History   Not on file  Tobacco Use   Smoking status: Never   Smokeless tobacco: Never  Vaping Use   Vaping status: Never Used  Substance and Sexual Activity   Alcohol use: Yes    Alcohol/week: 7.0 standard drinks of alcohol    Types: 7 Glasses of wine per week    Comment: wine occ   Drug use: No   Sexual activity: Not on file  Other Topics Concern   Not on file  Social History Narrative   Not on file   Social Drivers of Health   Financial Resource Strain: Low Risk  (06/11/2023)   Received from [facility]   Overall Financial Resource Strain (CARDIA)    Difficulty of Paying Living Expenses: Not hard at all  Food Insecurity: No Food Insecurity (06/11/2023)   Received from [facility]   Hunger Vital Sign    Worried About Running Out of Food in the Last Year: Never true    Ran Out of Food in the Last Year: Never true  Transportation Needs: No Transportation Needs (06/11/2023)   Received from [facility] - Transportation    In the past 12 months, has lack of transportation kept you from medical appointments or from getting medications?: No    Lack of Transportation (Non-Medical): No  Physical Activity: Not on file  Stress: Not on file  Social Connections: Not on file  Intimate Partner Violence: Patient Unable To Answer (03/07/2023)   Humiliation, Afraid, Rape, and Kick questionnaire    Fear of Current or Ex-Partner: Patient unable to answer    Emotionally Abused: Patient unable to answer    Physically Abused: Patient unable to answer    Sexually Abused: Patient unable to answer    FAMILY HISTORY: Family History  Problem Relation  Age of Onset   Pneumonia Father     ALLERGIES:  is allergic to ciprofloxacin, amoxicillin-pot clavulanate, meloxicam, and trimethoprim.  MEDICATIONS:  Current Outpatient  Medications  Medication Sig Dispense Refill   acetaminophen (TYLENOL) 500 MG tablet Take 500 mg by mouth every 6 (six) hours as needed for mild pain, moderate pain, fever or headache.     acyclovir (ZOVIRAX) 400 MG tablet Take 400 mg by mouth 2 (two) times daily.     ALPRAZolam (XANAX) 0.25 MG tablet Take 1 tablet (0.25 mg total) by mouth at bedtime as needed for anxiety. 30 tablet 0   atorvastatin (LIPITOR) 40 MG tablet Take 1 tablet (40 mg total) by mouth daily. (Patient not taking: Reported on 07/02/2023) 30 tablet 2   hydrOXYzine (ATARAX) 25 MG tablet Take 1 tablet by mouth at bedtime. (Patient not taking: Reported on 07/02/2023)     ixazomib citrate (NINLARO) 4 MG capsule Take 1 capsule (4 mg) by mouth weekly, 3 weeks on, 1 week off, repeat every 4 weeks. Take on an empty stomach 1hr before or 2hr after meals. 3 capsule 0   lenalidomide (REVLIMID) 5 MG capsule Take 5 mg by mouth daily. Take for 14 days, then hold for 14 days. Repeat every 28 days. [employer] Auth #    Date Obtained     levothyroxine (SYNTHROID, LEVOTHROID) 50 MCG tablet Take 50 mcg by mouth daily before breakfast.     metoCLOPramide (REGLAN) 10 MG tablet Take 1 tablet (10 mg total) by mouth every 8 (eight) hours as needed for up to 10 doses (hiccups). Take one pill 1 hour prior to taking Decadron. 10 tablet 0   pantoprazole (PROTONIX) 40 MG tablet Take 40 mg by mouth daily as needed.     pravastatin (PRAVACHOL) 10 MG tablet Take 1 tablet by mouth at bedtime. (Patient not taking: Reported on 07/02/2023)     Probiotic Product (PROBIOTIC ADVANCED PO) Take 1 tablet by mouth at bedtime.     senna (SENOKOT) 8.6 MG tablet Take 2 tablets (17.2 mg total) by mouth 2 (two) times daily. May crush, mix with water and give sublingually if needed. 28 tablet 0   sulfamethoxazole-trimethoprim (BACTRIM DS) 800-160 MG tablet Take 1 tablet by mouth every 12 (twelve) hours. (Patient not taking: Reported on 07/02/2023) 60 tablet 0   traZODone (DESYREL)  50 MG tablet Take by mouth. (Patient not taking: Reported on 07/02/2023)     trimethoprim (TRIMPEX) 100 MG tablet Take 1 tablet (100 mg total) by mouth daily. 90 tablet 0   zolpidem (AMBIEN) 5 MG tablet Take 1 tablet (5 mg total) by mouth at bedtime as needed for sleep. 30 tablet 0   No current facility-administered medications for this visit.    REVIEW OF SYSTEMS:   Pertinent information mentioned in HPI All other systems were reviewed with the patient and are negative.  PHYSICAL EXAMINATION: ECOG PERFORMANCE STATUS: 1 - Symptomatic but completely ambulatory  Vitals:   07/11/23 1403  BP: 116/68  Pulse: 68  Resp: 18  Temp: 98.7 F (37.1 C)  SpO2: 100%      Filed Weights   07/11/23 1403  Weight: 142 lb (64.4 kg)       GENERAL:alert, no distress and comfortable SKIN: skin color, texture, turgor are normal, no rashes or significant lesions EYES: normal, conjunctiva are pink and non-injected, sclera clear OROPHARYNX:no exudate, no erythema and lips, buccal mucosa, and tongue normal  NECK: supple, thyroid normal size, non-tender, without nodularity LYMPH:  no  palpable lymphadenopathy in the cervical, axillary or inguinal LUNGS: clear to auscultation and percussion with normal breathing effort HEART: regular rate & rhythm and no murmurs and no lower extremity edema ABDOMEN:abdomen soft, non-tender and normal bowel sounds Musculoskeletal:no cyanosis of digits and no clubbing  PSYCH: alert & oriented x 3 with fluent speech NEURO: no focal motor/sensory deficits  LABORATORY DATA:  I have reviewed the data as listed Lab Results  Component Value Date   WBC 4.4 07/11/2023   HGB 12.1 (L) 07/11/2023   HCT 35.6 (L) 07/11/2023   MCV 102.3 (H) 07/11/2023   PLT 193 07/11/2023   Recent Labs    06/23/23 0912 06/30/23 1328 07/11/23 1328  NA 131* 130* 130*  K 4.2 3.9 4.1  CL 97* 97* 96*  CO2 25 24 25   GLUCOSE 99 88 91  BUN 12 12 15   CREATININE 1.14 0.84 0.97  CALCIUM  8.2* 8.6* 8.5*  GFRNONAA >60 >60 >60  PROT 6.6 6.6 6.2*  ALBUMIN 3.1* 3.6 3.4*  AST 21 34 22  ALT 18 32 23  ALKPHOS 67 80 85  BILITOT 0.9 0.7 0.5    RADIOGRAPHIC STUDIES: I have personally reviewed the radiological images as listed and agreed with the findings in the report. MR Cervical Spine W Wo Contrast Result Date: 06/30/2023 CLINICAL DATA:  Worsening back pain with history of myeloma. EXAM: MRI CERVICAL, THORACIC AND LUMBAR SPINE WITHOUT AND WITH CONTRAST TECHNIQUE: Multiplanar and multiecho pulse sequences of the cervical spine, to include the craniocervical junction and cervicothoracic junction, and thoracic and lumbar spine, were obtained without and with intravenous contrast. CONTRAST:  6mL GADAVIST GADOBUTROL 1 MMOL/ML IV SOLN COMPARISON:  01/26/2023 FINDINGS: MRI CERVICAL SPINE FINDINGS Alignment: Mild retrolisthesis at C2-3 with anterolisthesis at C4-5 and C7-T1. Vertebrae: Extensive heterogeneity of the skull base and cervical marrow, a change from prior with extensive nodular enhancement. Findings consistent with progression of disease. No superimposed fracture or infection. Cord: Normal signal and morphology. Posterior Fossa, vertebral arteries, paraspinal tissues: No evidence of soft tissue mass. Disc levels: C2-3: Disc narrowing with retrolisthesis and endplate/uncovertebral ridging. Facet spurring on both sides. Moderate biforaminal narrowing. Spinal stenosis causes mild cord mass effect C3-4: Disc narrowing with uncovertebral and facet spurring eccentric to the right where there is foraminal impingement. Bulging disc with ridging and ligamentum flavum thickening causes mild spinal stenosis. C4-5: Degenerative facet spurring asymmetric to the right. Asymmetric right uncovertebral ridging. Right foraminal impingement. C5-6: Disc narrowing and bulging with uncovertebral ridging and biforaminal impingement. C6-7: Disc narrowing and bulging with uncovertebral ridging. Left more than right  foraminal impingement C7-T1:Facet spurring and disc space narrowing without impingement. MRI THORACIC SPINE FINDINGS Alignment:  Physiologic. Vertebrae: Innumerable nodular areas of STIR hyperintensity and enhancement throughout the thoracic spine and covered ribs. Regressed/resolved marrow edema associated with previously seen compression fractures at T8, T9, T11, T12, and L1. No acute fracture. No evidence of soft tissue tumor extension. Cord:  Normal signal and morphology Paraspinal and other soft tissues: No evidence of perispinal mass or inflammation. Disc levels: Generalized disc narrowing and desiccation with bulging. Degenerative facet spurring at T9-10 and T10-11. Moderate bilateral foraminal stenosis at T9-10 and T10-11. The spinal canal is diffusely patent. MRI LUMBAR SPINE FINDINGS Segmentation:  Standard. Alignment:  Physiologic. Vertebrae: Innumerable STIR hyperintense lesions throughout the covered skeleton, consistent with myeloma. Resolved marrow edema at L1 and L3 compression fractures seen on prior. No acute fracture. No evidence of extraosseous tumor extension. Conus medullaris and cauda equina: Conus extends  to the L1 level. Conus and cauda equina appear normal. Paraspinal and other soft tissues: No perispinal mass or inflammation detected. Disc levels: L1-L2: Mild facet spurring and disc bulging L2-L3: Disc narrowing and bulging with endplate and facet spurring. Moderate spinal stenosis. Patent foramina L3-L4: Disc narrowing and bulging with degenerative facet spurring and ligamentum flavum thickening. Mild to moderate spinal stenosis. Mild right foraminal narrowing L4-L5: Disc narrowing and bulging with endplate and facet spurring eccentric to the left. Moderate narrowing of the thecal sac. Mild left foraminal stenosis. L5-S1:Degenerative facet spurring. Circumferential disc bulging. No neural compression. IMPRESSION: 1. Progression of multiple myeloma with innumerable lesions throughout the  covered skeleton since 01/26/2023 comparison. 2. Compression fractures seen on prior have healed. No acute fracture. No soft tissue tumor extension. 3. Degenerative changes are stable from prior and described above. 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 Electronically Signed   By: Tiburcio Pea M.D.   On: 06/30/2023 09:53   MR Thoracic Spine W Wo Contrast Result Date: 06/30/2023 CLINICAL DATA:  Worsening back pain with history of myeloma. EXAM: MRI CERVICAL, THORACIC AND LUMBAR SPINE WITHOUT AND WITH CONTRAST TECHNIQUE: Multiplanar and multiecho pulse sequences of the cervical spine, to include the craniocervical junction and cervicothoracic junction, and thoracic and lumbar spine, were obtained without and with intravenous contrast. CONTRAST:  6mL GADAVIST GADOBUTROL 1 MMOL/ML IV SOLN COMPARISON:  01/26/2023 FINDINGS: MRI CERVICAL SPINE FINDINGS Alignment: Mild retrolisthesis at C2-3 with anterolisthesis at C4-5 and C7-T1. Vertebrae: Extensive heterogeneity of the skull base and cervical marrow, a change from prior with extensive nodular enhancement. Findings consistent with progression of disease. No superimposed fracture or infection. Cord: Normal signal and morphology. Posterior Fossa, vertebral arteries, paraspinal tissues: No evidence of soft tissue mass. Disc levels: C2-3: Disc narrowing with retrolisthesis and endplate/uncovertebral ridging. Facet spurring on both sides. Moderate biforaminal narrowing. Spinal stenosis causes mild cord mass effect C3-4: Disc narrowing with uncovertebral and facet spurring eccentric to the right where there is foraminal impingement. Bulging disc with ridging and ligamentum flavum thickening causes mild spinal stenosis. C4-5: Degenerative facet spurring asymmetric to the right. Asymmetric right uncovertebral ridging. Right foraminal impingement. C5-6: Disc narrowing and bulging with uncovertebral  ridging and biforaminal impingement. C6-7: Disc narrowing and bulging with uncovertebral ridging. Left more than right foraminal impingement C7-T1:Facet spurring and disc space narrowing without impingement. MRI THORACIC SPINE FINDINGS Alignment:  Physiologic. Vertebrae: Innumerable nodular areas of STIR hyperintensity and enhancement throughout the thoracic spine and covered ribs. Regressed/resolved marrow edema associated with previously seen compression fractures at T8, T9, T11, T12, and L1. No acute fracture. No evidence of soft tissue tumor extension. Cord:  Normal signal and morphology Paraspinal and other soft tissues: No evidence of perispinal mass or inflammation. Disc levels: Generalized disc narrowing and desiccation with bulging. Degenerative facet spurring at T9-10 and T10-11. Moderate bilateral foraminal stenosis at T9-10 and T10-11. The spinal canal is diffusely patent. MRI LUMBAR SPINE FINDINGS Segmentation:  Standard. Alignment:  Physiologic. Vertebrae: Innumerable STIR hyperintense lesions throughout the covered skeleton, consistent with myeloma. Resolved marrow edema at L1 and L3 compression fractures seen on prior. No acute fracture. No evidence of extraosseous tumor extension. Conus medullaris and cauda equina: Conus extends to the L1 level. Conus and cauda equina appear normal. Paraspinal and other soft tissues: No perispinal mass or inflammation detected. Disc levels: L1-L2: Mild facet spurring and disc bulging L2-L3: Disc narrowing and bulging with endplate and facet spurring. Moderate  spinal stenosis. Patent foramina L3-L4: Disc narrowing and bulging with degenerative facet spurring and ligamentum flavum thickening. Mild to moderate spinal stenosis. Mild right foraminal narrowing L4-L5: Disc narrowing and bulging with endplate and facet spurring eccentric to the left. Moderate narrowing of the thecal sac. Mild left foraminal stenosis. L5-S1:Degenerative facet spurring. Circumferential disc  bulging. No neural compression. IMPRESSION: 1. Progression of multiple myeloma with innumerable lesions throughout the covered skeleton since 01/26/2023 comparison. 2. Compression fractures seen on prior have healed. No acute fracture. No soft tissue tumor extension. 3. Degenerative changes are stable from prior and described above. 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 Electronically Signed   By: Tiburcio Pea M.D.   On: 06/30/2023 09:53   MR Lumbar Spine W Wo Contrast Result Date: 06/30/2023 CLINICAL DATA:  Worsening back pain with history of myeloma. EXAM: MRI CERVICAL, THORACIC AND LUMBAR SPINE WITHOUT AND WITH CONTRAST TECHNIQUE: Multiplanar and multiecho pulse sequences of the cervical spine, to include the craniocervical junction and cervicothoracic junction, and thoracic and lumbar spine, were obtained without and with intravenous contrast. CONTRAST:  6mL GADAVIST GADOBUTROL 1 MMOL/ML IV SOLN COMPARISON:  01/26/2023 FINDINGS: MRI CERVICAL SPINE FINDINGS Alignment: Mild retrolisthesis at C2-3 with anterolisthesis at C4-5 and C7-T1. Vertebrae: Extensive heterogeneity of the skull base and cervical marrow, a change from prior with extensive nodular enhancement. Findings consistent with progression of disease. No superimposed fracture or infection. Cord: Normal signal and morphology. Posterior Fossa, vertebral arteries, paraspinal tissues: No evidence of soft tissue mass. Disc levels: C2-3: Disc narrowing with retrolisthesis and endplate/uncovertebral ridging. Facet spurring on both sides. Moderate biforaminal narrowing. Spinal stenosis causes mild cord mass effect C3-4: Disc narrowing with uncovertebral and facet spurring eccentric to the right where there is foraminal impingement. Bulging disc with ridging and ligamentum flavum thickening causes mild spinal stenosis. C4-5: Degenerative facet spurring asymmetric to the right.  Asymmetric right uncovertebral ridging. Right foraminal impingement. C5-6: Disc narrowing and bulging with uncovertebral ridging and biforaminal impingement. C6-7: Disc narrowing and bulging with uncovertebral ridging. Left more than right foraminal impingement C7-T1:Facet spurring and disc space narrowing without impingement. MRI THORACIC SPINE FINDINGS Alignment:  Physiologic. Vertebrae: Innumerable nodular areas of STIR hyperintensity and enhancement throughout the thoracic spine and covered ribs. Regressed/resolved marrow edema associated with previously seen compression fractures at T8, T9, T11, T12, and L1. No acute fracture. No evidence of soft tissue tumor extension. Cord:  Normal signal and morphology Paraspinal and other soft tissues: No evidence of perispinal mass or inflammation. Disc levels: Generalized disc narrowing and desiccation with bulging. Degenerative facet spurring at T9-10 and T10-11. Moderate bilateral foraminal stenosis at T9-10 and T10-11. The spinal canal is diffusely patent. MRI LUMBAR SPINE FINDINGS Segmentation:  Standard. Alignment:  Physiologic. Vertebrae: Innumerable STIR hyperintense lesions throughout the covered skeleton, consistent with myeloma. Resolved marrow edema at L1 and L3 compression fractures seen on prior. No acute fracture. No evidence of extraosseous tumor extension. Conus medullaris and cauda equina: Conus extends to the L1 level. Conus and cauda equina appear normal. Paraspinal and other soft tissues: No perispinal mass or inflammation detected. Disc levels: L1-L2: Mild facet spurring and disc bulging L2-L3: Disc narrowing and bulging with endplate and facet spurring. Moderate spinal stenosis. Patent foramina L3-L4: Disc narrowing and bulging with degenerative facet spurring and ligamentum flavum thickening. Mild to moderate spinal stenosis. Mild right foraminal narrowing L4-L5: Disc narrowing and bulging with endplate and facet spurring eccentric to the left.  Moderate narrowing of the thecal sac. Mild left foraminal stenosis. L5-S1:Degenerative facet spurring. Circumferential disc bulging. No neural compression. IMPRESSION: 1. Progression of multiple myeloma with innumerable lesions throughout the covered skeleton since 01/26/2023 comparison. 2. Compression fractures seen on prior have healed. No acute fracture. No soft tissue tumor extension. 3. Degenerative changes are stable from prior and described above. 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 Electronically Signed   By: Tiburcio Pea M.D.   On: 06/30/2023 09:53

## 2023-07-14 ENCOUNTER — Encounter: Payer: Self-pay | Admitting: Internal Medicine

## 2023-07-15 ENCOUNTER — Telehealth: Payer: Self-pay | Admitting: Internal Medicine

## 2023-07-15 NOTE — Telephone Encounter (Signed)
Discussed with wife over the phone.  8 hours after he took Ninlaro last Friday, he had low-grade temp of 100.4 which lasted for couple of days.  Only at night.  Used Tylenol twice.  No temperature during the day.  Had few episodes of diarrhea for a day manageable with Imodium.  Otherwise he has been feeling well.  Denies any respiratory symptoms, sore throat, shortness of breath.  Appetite is good.  Advised to do Tylenol 500 mg and Benadryl 25 mg 30 minutes prior to the next dose of Ninlaro.  He is scheduled for blood work on 12/27 in the afternoon.  I advised to take Ninlaro after the labs come back.  Diarrhea can be seen in about 50% of the cases so it may just be medication related.  His low-grade temp is completely resolved.  Continue to monitor.  Repeat labs on Friday.  No other symptoms.

## 2023-07-18 ENCOUNTER — Inpatient Hospital Stay: Payer: Medicare HMO

## 2023-07-18 ENCOUNTER — Other Ambulatory Visit: Payer: Self-pay | Admitting: *Deleted

## 2023-07-18 DIAGNOSIS — C9000 Multiple myeloma not having achieved remission: Secondary | ICD-10-CM

## 2023-07-18 DIAGNOSIS — Z5112 Encounter for antineoplastic immunotherapy: Secondary | ICD-10-CM | POA: Diagnosis not present

## 2023-07-18 LAB — CMP (CANCER CENTER ONLY)
ALT: 24 U/L (ref 0–44)
AST: 25 U/L (ref 15–41)
Albumin: 3.7 g/dL (ref 3.5–5.0)
Alkaline Phosphatase: 78 U/L (ref 38–126)
Anion gap: 11 (ref 5–15)
BUN: 16 mg/dL (ref 8–23)
CO2: 24 mmol/L (ref 22–32)
Calcium: 8.6 mg/dL — ABNORMAL LOW (ref 8.9–10.3)
Chloride: 94 mmol/L — ABNORMAL LOW (ref 98–111)
Creatinine: 0.97 mg/dL (ref 0.61–1.24)
GFR, Estimated: >60 mL/min (ref >60)
Glucose, Bld: 98 mg/dL (ref 70–99)
Potassium: 3.3 mmol/L — ABNORMAL LOW (ref 3.5–5.1)
Sodium: 129 mmol/L — ABNORMAL LOW (ref 135–145)
Total Bilirubin: 1 mg/dL (ref <1.2)
Total Protein: 6.6 g/dL (ref 6.5–8.1)

## 2023-07-18 LAB — CBC WITH DIFFERENTIAL (CANCER CENTER ONLY)
Abs Immature Granulocytes: 0.04 10*3/uL (ref 0.00–0.07)
Basophils Absolute: 0 10*3/uL (ref 0.0–0.1)
Basophils Relative: 0 %
Eosinophils Absolute: 0.1 10*3/uL (ref 0.0–0.5)
Eosinophils Relative: 1 %
HCT: 37.1 % — ABNORMAL LOW (ref 39.0–52.0)
Hemoglobin: 12.6 g/dL — ABNORMAL LOW (ref 13.0–17.0)
Immature Granulocytes: 1 %
Lymphocytes Relative: 17 %
Lymphs Abs: 1.3 10*3/uL (ref 0.7–4.0)
MCH: 34.5 pg — ABNORMAL HIGH (ref 26.0–34.0)
MCHC: 34 g/dL (ref 30.0–36.0)
MCV: 101.6 fL — ABNORMAL HIGH (ref 80.0–100.0)
Monocytes Absolute: 0.7 10*3/uL (ref 0.1–1.0)
Monocytes Relative: 9 %
Neutro Abs: 5.7 10*3/uL (ref 1.7–7.7)
Neutrophils Relative %: 72 %
Platelet Count: 166 10*3/uL (ref 150–400)
RBC: 3.65 MIL/uL — ABNORMAL LOW (ref 4.22–5.81)
RDW: 15.6 % — ABNORMAL HIGH (ref 11.5–15.5)
WBC Count: 7.9 10*3/uL (ref 4.0–10.5)
nRBC: 0 % (ref 0.0–0.2)

## 2023-07-18 MED ORDER — LENALIDOMIDE 5 MG PO CAPS: 5.0000 mg | ORAL_CAPSULE | Freq: Every day | ORAL | 0 refills | Status: DC

## 2023-07-22 ENCOUNTER — Other Ambulatory Visit: Payer: Self-pay

## 2023-07-22 ENCOUNTER — Other Ambulatory Visit: Payer: Self-pay | Admitting: Internal Medicine

## 2023-07-22 ENCOUNTER — Other Ambulatory Visit (HOSPITAL_COMMUNITY): Payer: Self-pay

## 2023-07-22 ENCOUNTER — Other Ambulatory Visit: Payer: Self-pay | Admitting: Pharmacist

## 2023-07-22 DIAGNOSIS — C9000 Multiple myeloma not having achieved remission: Secondary | ICD-10-CM

## 2023-07-22 MED ORDER — IXAZOMIB CITRATE 4 MG PO CAPS
ORAL_CAPSULE | ORAL | 0 refills | Status: DC
  Filled 2023-07-22: qty 3, 28d supply, fill #0

## 2023-07-22 MED ORDER — LENALIDOMIDE 5 MG PO CAPS: 5.0000 mg | ORAL_CAPSULE | Freq: Every day | ORAL | 0 refills | Status: DC

## 2023-07-22 NOTE — Progress Notes (Signed)
 Specialty Pharmacy Refill Coordination Note  Melvin Gilmore is a 82 y.o. male contacted today regarding refills of specialty medication(s) Ixazomib Citrate  (NINLARO )   Patient requested Delivery   Delivery date: 07/29/23   Verified address: [address]  [place] [address]   Medication will be filled on 07/28/23.   Pending refill request

## 2023-07-22 NOTE — Progress Notes (Signed)
 07/22/23: Ninlaro   The patient's wife called back and informed us  that she told us  the incorrect date of his next cycle start date. Melvin Gilmore will start his next cycle on 07/25/23. The medication will be ordered on 12/31, and shipped 01/02 barring any unforseen circumstances. New delivery date is 07/25/23.

## 2023-07-22 NOTE — Addendum Note (Signed)
 Addended by: Remi Haggard on: 07/22/2023 02:05 PM   Modules accepted: Orders

## 2023-07-22 NOTE — Progress Notes (Signed)
 MD wants to change patient to 21 days on and 7 days off of Revlimid  (old scheduled 14on/14off). Patient just had 14 capsules delivered. New rx for 7 capsules was sent to [facility].  Spoke with patient's wife and updated her on the medication scheduled. She know to reach out to [facility] on Friday if she has not heard from them.

## 2023-07-22 NOTE — Progress Notes (Signed)
 Specialty Pharmacy Ongoing Clinical Assessment Note  Melvin Gilmore is a 82 y.o. male who is being followed by the specialty pharmacy service for [facility] Oncology   Patient's specialty medication(s) reviewed today: Ixazomib Citrate  (NINLARO )   Missed doses in the last 4 weeks: 0   Patient/Caregiver did not have any additional questions or concerns.   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 Patient will maintain adherence         Follow up:  3 months  Silvano LOISE Dolly Specialty Pharmacist

## 2023-07-24 ENCOUNTER — Encounter: Payer: Self-pay | Admitting: Internal Medicine

## 2023-07-24 ENCOUNTER — Other Ambulatory Visit (HOSPITAL_COMMUNITY): Payer: Self-pay

## 2023-07-24 ENCOUNTER — Telehealth: Payer: Self-pay

## 2023-07-24 ENCOUNTER — Other Ambulatory Visit: Payer: Self-pay

## 2023-07-24 NOTE — Telephone Encounter (Signed)
 Oral Oncology Patient Advocate Encounter  **Received notification that patient's co-pay was $2,000.00. Patient has active grant with [employer].**  Was successful in securing patient a $12,000.00 grant from [facility] to provide copayment coverage for Ninlaro .  This will keep the out of pocket expense at $0.     [employer] ID: [id number]   The billing information is as follows and has been shared with [facility].    RxBin: [id number] PCN: [id number] Member ID: [id number] Group ID: [id number] Dates of Eligibility: 11/18/22 through 11/17/23  Fund:  Multiple Myeloma - Medicare Access   Morene Potters, CPhT Oncology Pharmacy Patient Advocate  [facility]  [phone] (phone) [phone] (fax) 07/24/2023 8:21 AM

## 2023-07-25 ENCOUNTER — Encounter: Payer: Self-pay | Admitting: Internal Medicine

## 2023-07-30 ENCOUNTER — Encounter: Payer: Self-pay | Admitting: Internal Medicine

## 2023-08-01 ENCOUNTER — Inpatient Hospital Stay: Payer: Medicare Other | Attending: Internal Medicine

## 2023-08-01 ENCOUNTER — Inpatient Hospital Stay: Payer: Medicare Other

## 2023-08-01 DIAGNOSIS — E785 Hyperlipidemia, unspecified: Secondary | ICD-10-CM | POA: Diagnosis not present

## 2023-08-01 DIAGNOSIS — E039 Hypothyroidism, unspecified: Secondary | ICD-10-CM | POA: Diagnosis not present

## 2023-08-01 DIAGNOSIS — K5900 Constipation, unspecified: Secondary | ICD-10-CM | POA: Insufficient documentation

## 2023-08-01 DIAGNOSIS — Z87440 Personal history of urinary (tract) infections: Secondary | ICD-10-CM | POA: Diagnosis not present

## 2023-08-01 DIAGNOSIS — Z8546 Personal history of malignant neoplasm of prostate: Secondary | ICD-10-CM | POA: Insufficient documentation

## 2023-08-01 DIAGNOSIS — F419 Anxiety disorder, unspecified: Secondary | ICD-10-CM | POA: Insufficient documentation

## 2023-08-01 DIAGNOSIS — I10 Essential (primary) hypertension: Secondary | ICD-10-CM | POA: Insufficient documentation

## 2023-08-01 DIAGNOSIS — D649 Anemia, unspecified: Secondary | ICD-10-CM | POA: Insufficient documentation

## 2023-08-01 DIAGNOSIS — C9000 Multiple myeloma not having achieved remission: Secondary | ICD-10-CM | POA: Diagnosis present

## 2023-08-01 DIAGNOSIS — K219 Gastro-esophageal reflux disease without esophagitis: Secondary | ICD-10-CM | POA: Diagnosis not present

## 2023-08-01 LAB — CBC WITH DIFFERENTIAL (CANCER CENTER ONLY)
Abs Immature Granulocytes: 0.01 10*3/uL (ref 0.00–0.07)
Basophils Absolute: 0 10*3/uL (ref 0.0–0.1)
Basophils Relative: 1 %
Eosinophils Absolute: 0.1 10*3/uL (ref 0.0–0.5)
Eosinophils Relative: 2 %
HCT: 40.1 % (ref 39.0–52.0)
Hemoglobin: 13.6 g/dL (ref 13.0–17.0)
Immature Granulocytes: 0 %
Lymphocytes Relative: 29 %
Lymphs Abs: 1.2 10*3/uL (ref 0.7–4.0)
MCH: 33.9 pg (ref 26.0–34.0)
MCHC: 33.9 g/dL (ref 30.0–36.0)
MCV: 100 fL (ref 80.0–100.0)
Monocytes Absolute: 0.4 10*3/uL (ref 0.1–1.0)
Monocytes Relative: 9 %
Neutro Abs: 2.4 10*3/uL (ref 1.7–7.7)
Neutrophils Relative %: 59 %
Platelet Count: 200 10*3/uL (ref 150–400)
RBC: 4.01 MIL/uL — ABNORMAL LOW (ref 4.22–5.81)
RDW: 14.4 % (ref 11.5–15.5)
WBC Count: 4 10*3/uL (ref 4.0–10.5)
nRBC: 0 % (ref 0.0–0.2)

## 2023-08-01 LAB — CMP (CANCER CENTER ONLY)
ALT: 21 U/L (ref 0–44)
AST: 30 U/L (ref 15–41)
Albumin: 4 g/dL (ref 3.5–5.0)
Alkaline Phosphatase: 82 U/L (ref 38–126)
Anion gap: 10 (ref 5–15)
BUN: 13 mg/dL (ref 8–23)
CO2: 26 mmol/L (ref 22–32)
Calcium: 9.4 mg/dL (ref 8.9–10.3)
Chloride: 97 mmol/L — ABNORMAL LOW (ref 98–111)
Creatinine: 0.93 mg/dL (ref 0.61–1.24)
GFR, Estimated: >60 mL/min (ref >60)
Glucose, Bld: 88 mg/dL (ref 70–99)
Potassium: 4.1 mmol/L (ref 3.5–5.1)
Sodium: 133 mmol/L — ABNORMAL LOW (ref 135–145)
Total Bilirubin: 0.9 mg/dL (ref 0.0–1.2)
Total Protein: 6.8 g/dL (ref 6.5–8.1)

## 2023-08-04 ENCOUNTER — Other Ambulatory Visit: Payer: Self-pay | Admitting: Internal Medicine

## 2023-08-04 LAB — KAPPA/LAMBDA LIGHT CHAINS
Kappa free light chain: 85.1 mg/L — ABNORMAL HIGH (ref 3.3–19.4)
Kappa, lambda light chain ratio: 19.79 — ABNORMAL HIGH (ref 0.26–1.65)
Lambda free light chains: 4.3 mg/L — ABNORMAL LOW (ref 5.7–26.3)

## 2023-08-08 LAB — MULTIPLE MYELOMA PANEL, SERUM
Albumin SerPl Elph-Mcnc: 3.7 g/dL (ref 2.9–4.4)
Albumin/Glob SerPl: 1.5 (ref 0.7–1.7)
Alpha 1: 0.3 g/dL (ref 0.0–0.4)
Alpha2 Glob SerPl Elph-Mcnc: 0.7 g/dL (ref 0.4–1.0)
B-Globulin SerPl Elph-Mcnc: 0.9 g/dL (ref 0.7–1.3)
Gamma Glob SerPl Elph-Mcnc: 0.6 g/dL (ref 0.4–1.8)
Globulin, Total: 2.5 g/dL (ref 2.2–3.9)
IgA: 199 mg/dL (ref 61–437)
IgG (Immunoglobin G), Serum: 525 mg/dL — ABNORMAL LOW (ref 603–1613)
IgM (Immunoglobulin M), Srm: 10 mg/dL — ABNORMAL LOW (ref 15–143)
M Protein SerPl Elph-Mcnc: 0.2 g/dL — ABNORMAL HIGH
Total Protein ELP: 6.2 g/dL (ref 6.0–8.5)

## 2023-08-11 ENCOUNTER — Encounter: Payer: Self-pay | Admitting: Internal Medicine

## 2023-08-11 ENCOUNTER — Inpatient Hospital Stay: Payer: Medicare Other | Admitting: Internal Medicine

## 2023-08-11 ENCOUNTER — Inpatient Hospital Stay: Payer: Medicare Other

## 2023-08-11 VITALS — BP 125/66 | HR 74 | Temp 98.9°F | Resp 16 | Wt 146.0 lb

## 2023-08-11 DIAGNOSIS — C9000 Multiple myeloma not having achieved remission: Secondary | ICD-10-CM

## 2023-08-11 LAB — CBC WITH DIFFERENTIAL/PLATELET
Abs Immature Granulocytes: 0.02 10*3/uL (ref 0.00–0.07)
Basophils Absolute: 0 10*3/uL (ref 0.0–0.1)
Basophils Relative: 0 %
Eosinophils Absolute: 0 10*3/uL (ref 0.0–0.5)
Eosinophils Relative: 1 %
HCT: 38.5 % — ABNORMAL LOW (ref 39.0–52.0)
Hemoglobin: 13 g/dL (ref 13.0–17.0)
Immature Granulocytes: 0 %
Lymphocytes Relative: 16 %
Lymphs Abs: 0.8 10*3/uL (ref 0.7–4.0)
MCH: 33.8 pg (ref 26.0–34.0)
MCHC: 33.8 g/dL (ref 30.0–36.0)
MCV: 100 fL (ref 80.0–100.0)
Monocytes Absolute: 0.2 10*3/uL (ref 0.1–1.0)
Monocytes Relative: 4 %
Neutro Abs: 3.9 10*3/uL (ref 1.7–7.7)
Neutrophils Relative %: 79 %
Platelets: 137 10*3/uL — ABNORMAL LOW (ref 150–400)
RBC: 3.85 MIL/uL — ABNORMAL LOW (ref 4.22–5.81)
RDW: 13.8 % (ref 11.5–15.5)
WBC: 4.9 10*3/uL (ref 4.0–10.5)
nRBC: 0 % (ref 0.0–0.2)

## 2023-08-11 LAB — COMPREHENSIVE METABOLIC PANEL
ALT: 20 U/L (ref 0–44)
AST: 26 U/L (ref 15–41)
Albumin: 3.8 g/dL (ref 3.5–5.0)
Alkaline Phosphatase: 70 U/L (ref 38–126)
Anion gap: 8 (ref 5–15)
BUN: 15 mg/dL (ref 8–23)
CO2: 25 mmol/L (ref 22–32)
Calcium: 8.7 mg/dL — ABNORMAL LOW (ref 8.9–10.3)
Chloride: 98 mmol/L (ref 98–111)
Creatinine, Ser: 1 mg/dL (ref 0.61–1.24)
GFR, Estimated: >60 mL/min (ref >60)
Glucose, Bld: 108 mg/dL — ABNORMAL HIGH (ref 70–99)
Potassium: 4.6 mmol/L (ref 3.5–5.1)
Sodium: 131 mmol/L — ABNORMAL LOW (ref 135–145)
Total Bilirubin: 0.5 mg/dL (ref 0.0–1.2)
Total Protein: 6.6 g/dL (ref 6.5–8.1)

## 2023-08-11 NOTE — Progress Notes (Signed)
[facility] CONSULT NOTE  Patient Care Team: [name], MD as PCP - General (Internal Medicine) [name], MD as Consulting Physician (Oncology)  CANCER STAGING   Cancer Staging  Multiple myeloma [facility]) Staging form: Plasma Cell Myeloma and Plasma Cell Disorders, AJCC 8th Edition - Clinical stage from 12/05/2022: RISS Stage II (Beta-2-microglobulin (mg/L): 3.7, Albumin (g/dL): 3.6, ISS: Stage II, High-risk cytogenetics: Absent, LDH: Normal) - Signed by [name], MD on 12/26/2022 Stage prefix: Initial diagnosis Beta 2 microglobulin range (mg/L): 3.5 to 5.49 Albumin range (g/dL): Greater than or equal to 3.5 Cytogenetics: 1q addition, Other mutation   ASSESSMENT & PLAN:  Melvin Gilmore 83 y.o. male with pmh of hypertension, hyperlipidemia, anxiety, GERD, BPH, hypothyroidism, prostate cancer s/p HIFU was seen by primary on November 13, 2022 for acute onset of lower back pain follows with medical oncology for management of multiple myeloma.  # Multiple myeloma, RISS Stage II, high risk -MRI thoracic and lumbar spine with and without contrast was reviewed. Showed unchanged subacute compression fracture at T12.  Focal enhancing marrow replacing lesions in T6, T8, T5, T2 all suspicious for metastatic disease.  L1 lesion measuring 16 mm.  Multilevel degenerative changes present.  -SPEP/IFE showed biclonal IgA kappa paraprotein with M spike 2.1 g/dL and second spike at 0.4 g/dL.  Kappa 453, lambda 6.8 with a ratio of 66.7.  Iron panel and B12 are normal.  PSA 3.44.  CBC showed mild anemia 13.4, chronic.  On CMP normal creatinine and calcium levels.  Elevated total protein 8.8.  LDH normal.  Beta-2 microglobulin 3.7.  Albumin 3.6.  24-hour urine/UPEP showed 1.1 g protein with no M spike.  -BMBx Hypercellular marrow with 60% plasma cells.  IHC positive for CD138/ mum 1.  Kappa restricted consistent with multiple myeloma. L1 vertebral body biopsy showed numerous plasma  cells. Cytogenetics - del (13q) and gain 1.    -PET/CT showed activity in L1 and left eighth rib concerning for myeloma.  -Started on Dara-RD on 12/26/2022.  Completed cycle 1 of daratumumab ->hospitalized for urinary retention, constipation and back pain -> inpatient hospitalist transition him to hospice -> patient revoked hospice on 01/31/2023 since he was feeling better.  Multiple treatment interruptions due to UTIs and hospitalization for stroke.  -Completed cycle 5-day 1 of daratumumab on 06/09/2023.  Progressed with increase in kappa light chain from 48 to 700 ratio 188.  Changed to VRD on 06/20/2023.  With C1D1 of Velcade he had rigors.  Premedicated with Tylenol and Benadryl with C1D8.  4 hours postinfusion had severe rigors, hallucinations, weakness.  Patient never wants to go back on Velcade.  -Started on Ixazomib 4 mg once weekly 3 weeks on and 1 week off on 07/04/2023.  Tolerating well.  Changed Revlimid from 5 mg to week on 2-week off to 3-week on 1 week off.  Cannot tolerate 10 mg dose due to neutropenia.  Continue with Dex 2 mg for 4 days weekly 3-week on 1 week off.  Cannot tolerate higher dose due to intractable hiccups.  -Labs reviewed and acceptable for treatment.  Myeloma panel reviewed.  Kappa light chain going down.  Repeat labs in 2 weeks.  Follow-up in 1 month with Zometa.  # Recurrent UTIs -Follows with Dr. Lonna Cobb.  On trimethoprim.    # Stroke - Admitted 03/07/2023 with acute onset aphasia and right hemianopia. s/p tPA with complete resolution of symptoms.  MRI brain negative.  Echo unremarkable.  On aspirin and Plavix for 21  days then aspirin alone.  On Lipitor 40 mg daily.    # Intractable hiccups -Secondary to dexamethasone.  # Vertebral compression fractures -Dental clearance obtained. - IV Zometa 3 mg (on 06/09/2023).  Next dose due in 3 months.  # T12 pathological fracture # Cancer-related pain -s/p kyphoplasty by Dr. Elijio Miles on 12/06/22.   -Completed palliative  RT on 01/20/2023.  -MRI spine performed From 06/27/2023 shows progression of multiple myeloma with innumerable lesions throughout the covered skeleton since July 2024.  Compression fractures seen on prior have healed.  No acute fracture.  Patient inquiring further role for kyphoplasty which I think is limited with no new acute fractures.  Continue with brace.  Can use hemp cream OTC.  Continues to decline oral opioids.  Continue with Tylenol.  # Constipation -Manageable with senna, MiraLAX, lactulose, Movantik as needed.  # History of prostate cancer - follows with Dr. Evelene Croon, urology for elevated PSA.  MRI prostate showed 1 mm category 5 lesion of anterior transition zone.  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 s/p HIFU of the prostate on 02/07/2022.  PSA level from 10/08/2022 was 3.34.  -PSA from 11/21/2022 3.44.  # Anxiety -He is taking Ambien 5 mg nightly prescribed by PCP.  Understands the risk of increased falls/confusion.  t really helps him with sleep and anxiety so he would like to continue it.  Orders Placed This Encounter  Procedures   CBC with Differential/Platelet    Standing Status:   Standing    Number of Occurrences:   6    Expiration Date:   08/10/2024   Comprehensive metabolic panel    Standing Status:   Standing    Number of Occurrences:   6    Expiration Date:   08/10/2024   Multiple Myeloma Panel (SPEP&IFE w/QIG)    Standing Status:   Standing    Number of Occurrences:   6    Expiration Date:   08/10/2024   Kappa/lambda light chains    Standing Status:   Standing    Number of Occurrences:   6    Expiration Date:   08/10/2024   CBC with Differential (Cancer Center Only)    Standing Status:   Future    Expected Date:   08/25/2023    Expiration Date:   08/10/2024   RTC in 2 weeks for lab only RTC in 4 weeks for MD visit, labs, Zometa  The total time spent in the appointment was 30 minutes encounter with patients  including review of chart and various tests results, discussions about plan of care and coordination of care plan   All questions were answered. The patient knows to call the clinic with any problems, questions or concerns. No barriers to learning was detected.  [name], MD 1/24/20254:25 PM   HISTORY OF PRESENTING ILLNESS:  Melvin Gilmore 83 y.o. male with past medical history of hypertension, hyperlipidemia, anxiety, GERD, BPH, hypothyroidism, prostate cancer s/p HIFU was seen by primary on November 13, 2022 for acute onset of lower back pain.  Patient reports that he was sitting outside and had violent sneeze which was followed by severe back pain.  He has fallen allergies.  Had x-rays followed by MRI thoracic spine without contrast.  It showed compression fracture of T12 superior endplate with surrounding marrow edema likely subacute.  15 mm T1 hypointense marrow lesion in the anterior aspect of L1 suspicious for metastasis.  Further characterization with postcontrast MRI recommended.  Prostate cancer-follows with Dr. Evelene Croon, urology for elevated PSA.  MRI prostate showed 1 mm category 5 lesion of anterior transition zone.  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 s/p HIFU of the prostate on 02/07/2022.  PSA level from 10/08/2022 was 3.34.  Interval history Patient was seen today accompanied with his wife on  Ixazomib, Revlimid and Dex. Overall tolerating treatment well.  Has mild diarrhea which is manageable.  Reports congestion in morning. About a month. Seasonal allergies. Hoarse in morning.   I have reviewed his chart and materials related to his cancer extensively and collaborated history with the patient. Summary of oncologic history is as follows: Oncology History  Multiple myeloma (HCC)  12/05/2022 Cancer Staging   Staging form: Plasma Cell Myeloma and Plasma Cell Disorders, AJCC 8th Edition - Clinical stage from 12/05/2022:  RISS Stage II (Beta-2-microglobulin (mg/L): 3.7, Albumin (g/dL): 3.6, ISS: Stage II, High-risk cytogenetics: Absent, LDH: Normal) - Signed by [name], MD on 12/26/2022 Stage prefix: Initial diagnosis Beta 2 microglobulin range (mg/L): 3.5 to 5.49 Albumin range (g/dL): Greater than or equal to 3.5 Cytogenetics: 1q addition, Other mutation   12/13/2022 Initial Diagnosis   Multiple myeloma (HCC)   12/26/2022 - 01/24/2023 Chemotherapy   Patient is on Treatment Plan : MYELOMA NEWLY DIAGNOSED Daratumumab IV + Lenalidomide + Dexamethasone Weekly (DaraRd) q28d     02/07/2023 - 02/07/2023 Chemotherapy   Patient is on Treatment Plan : MYELOMA NON-TRANSPLANT CANDIDATES VRd weekly q21d     02/13/2023 - 06/09/2023 Chemotherapy   Patient is on Treatment Plan : MYELOMA NEWLY DIAGNOSED Daratumumab IV + Lenalidomide + Dexamethasone Weekly (DaraRd) q28d     06/13/2023 - 06/23/2023 Chemotherapy   Patient is on Treatment Plan : MYELOMA  RVD SQ q21d x 4 cycles       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   Nausea vomiting and diarrhea 01/26/2023   Prostate cancer (HCC) 2023   Seasonal allergies    Skin cancer    a.) ears   Wears partial dentures    lower    SURGICAL HISTORY: Past Surgical History:  Procedure Laterality Date   APPENDECTOMY     CATARACT EXTRACTION W/PHACO Right 01/15/2016   Procedure: CATARACT EXTRACTION PHACO AND INTRAOCULAR LENS PLACEMENT (IOC);  Surgeon: Sherald Hess, MD;  Location: [facility];  Service:  Ophthalmology;  Laterality: Right;  RIGHT CALL CELL PHONE WITH TIME   CHOLECYSTECTOMY     COLONOSCOPY     COLONOSCOPY WITH ESOPHAGOGASTRODUODENOSCOPY (EGD)     ESOPHAGOGASTRODUODENOSCOPY  07/2021   ESOPHAGOGASTRODUODENOSCOPY (EGD) WITH PROPOFOL N/A 09/23/2017   Procedure: ESOPHAGOGASTRODUODENOSCOPY (EGD) WITH PROPOFOL;  Surgeon: Toledo, Boykin Nearing, MD;  Location: [facility];  Service: Gastroenterology;  Laterality: N/A;   HIGH INTENSITY FOCUSED ULTRASOUND (HIFU) OF THE PROSTATE N/A 02/07/2022   Procedure: HIGH INTENSITY FOCUSED ULTRASOUND (HIFU) OF THE PROSTATE;  Surgeon: Orson Ape, MD;  Location: [facility];  Service: Urology;  Laterality: N/A;   HYDROCELE EXCISION / REPAIR     IR KYPHO THORACIC WITH BONE BIOPSY  12/06/2022   IR RADIOLOGIST EVAL & MGMT  11/21/2022   LASIX RT EYE     PROSTATE BIOPSY N/A 01/03/2022   Procedure: PROSTATE BIOPSY  Addison Buehring;  Surgeon: Orson Ape, MD;  Location: [facility] ORS;  Service: Urology;  Laterality: N/A;   ROTATOR CUFF REPAIR Left 2001    SOCIAL HISTORY: Social History   Socioeconomic History   Marital status: Married    Spouse name: Not on file   Number of children: Not on file   Years of education: Not on file   Highest education level: Not on file  Occupational History   Not on file  Tobacco Use   Smoking status: Never   Smokeless tobacco: Never  Vaping Use   Vaping status: Never Used  Substance and Sexual Activity   Alcohol use: Yes    Alcohol/week: 7.0 standard drinks of alcohol    Types: 7 Glasses of wine per week    Comment: wine occ   Drug use: No   Sexual activity: Not on file  Other Topics Concern   Not on file  Social History Narrative   Not on file   Social Drivers of Health   Financial Resource Strain: Low Risk  (06/11/2023)   Received [facility]   Overall Financial Resource Strain (CARDIA)    Difficulty of Paying Living Expenses: Not hard at all  Food Insecurity: No Food Insecurity  (06/11/2023)   [facility] from [facility]   Hunger Vital Sign    Worried About Running Out of Food in the Last Year: Never true    Ran Out of Food in the Last Year: Never true  Transportation Needs: No Transportation Needs (06/11/2023)   Received from [facility] - Transportation    In the past 12 months, has lack of transportation kept you from medical appointments or from getting medications?: No    Lack of Transportation (Non-Medical): No  Physical Activity: Not on file  Stress: Not on file  Social Connections: Not on file  Intimate Partner Violence: Patient Unable To Answer (03/07/2023)   Humiliation, Afraid, Rape, and Kick questionnaire    Fear of Current or Ex-Partner: Patient unable to answer    Emotionally Abused: Patient unable to answer    Physically Abused: Patient unable to answer    Sexually Abused: Patient unable to answer    FAMILY HISTORY: Family History  Problem Relation Age of Onset   Pneumonia Father     ALLERGIES:  is allergic to ciprofloxacin, amoxicillin-pot clavulanate, meloxicam, and trimethoprim.  MEDICATIONS:  Current Outpatient Medications  Medication Sig Dispense Refill   acetaminophen (TYLENOL) 500 MG tablet Take 500 mg by mouth every 6 (six) hours as needed for mild pain, moderate pain, fever or headache.     acyclovir (ZOVIRAX) 400 MG tablet Take 400 mg by mouth 2 (two) times daily.     ALPRAZolam (XANAX) 0.25 MG tablet Take 1 tablet (0.25 mg total) by mouth at bedtime as needed for anxiety. 30 tablet 0   lenalidomide (REVLIMID) 5 MG capsule Take 1 capsule (5 mg total) by mouth daily. Take for 21 days, then hold for 7 days. Repeat every 28 days. 7 capsule 0   levothyroxine (SYNTHROID, LEVOTHROID) 50 MCG tablet Take 50 mcg by mouth daily before breakfast.     metoCLOPramide (  REGLAN) 10 MG tablet Take 1 tablet (10 mg total) by mouth every 8 (eight) hours as needed for up to 10 doses (hiccups). Take one pill  1 hour prior to taking Decadron. 10 tablet 0   pantoprazole (PROTONIX) 40 MG tablet Take 40 mg by mouth daily as needed.     Probiotic Product (PROBIOTIC ADVANCED PO) Take 1 tablet by mouth at bedtime.     senna (SENOKOT) 8.6 MG tablet Take 2 tablets (17.2 mg total) by mouth 2 (two) times daily. May crush, mix with water and give sublingually if needed. 28 tablet 0   zolpidem (AMBIEN) 5 MG tablet TAKE 1 TABLET BY MOUTH AT BEDTIME AS NEEDED FOR SLEEP. 30 tablet 0   atorvastatin (LIPITOR) 40 MG tablet Take 1 tablet (40 mg total) by mouth daily. (Patient not taking: Reported on 08/11/2023) 30 tablet 2   hydrOXYzine (ATARAX) 25 MG tablet Take 1 tablet by mouth at bedtime. (Patient not taking: Reported on 08/11/2023)     ixazomib citrate (NINLARO) 4 MG capsule Take 1 capsule (4 mg) by mouth weekly, 3 weeks on, 1 week off, repeat every 4 weeks. Take on an empty stomach 1hr before or 2hr after meals. 3 capsule 0   pravastatin (PRAVACHOL) 10 MG tablet Take 1 tablet by mouth at bedtime. (Patient not taking: Reported on 08/11/2023)     sulfamethoxazole-trimethoprim (BACTRIM DS) 800-160 MG tablet Take 1 tablet by mouth every 12 (twelve) hours. (Patient not taking: Reported on 08/11/2023) 60 tablet 0   traZODone (DESYREL) 50 MG tablet Take by mouth. (Patient not taking: Reported on 08/11/2023)     trimethoprim (TRIMPEX) 100 MG tablet Take 1 tablet (100 mg total) by mouth daily. (Patient not taking: Reported on 08/11/2023) 90 tablet 0   No current facility-administered medications for this visit.    REVIEW OF SYSTEMS:   Pertinent information mentioned in HPI All other systems were reviewed with the patient and are negative.  PHYSICAL EXAMINATION: ECOG PERFORMANCE STATUS: 1 - Symptomatic but completely ambulatory  Vitals:   08/11/23 1409  BP: 125/66  Pulse: 74  Resp: 16  Temp: 98.9 F (37.2 C)  SpO2: 99%      Filed Weights   08/11/23 1409  Weight: 146 lb (66.2 kg)       GENERAL:alert, no  distress and comfortable SKIN: skin color, texture, turgor are normal, no rashes or significant lesions EYES: normal, conjunctiva are pink and non-injected, sclera clear OROPHARYNX:no exudate, no erythema and lips, buccal mucosa, and tongue normal  NECK: supple, thyroid normal size, non-tender, without nodularity LYMPH:  no palpable lymphadenopathy in the cervical, axillary or inguinal LUNGS: clear to auscultation and percussion with normal breathing effort HEART: regular rate & rhythm and no murmurs and no lower extremity edema ABDOMEN:abdomen soft, non-tender and normal bowel sounds Musculoskeletal:no cyanosis of digits and no clubbing  PSYCH: alert & oriented x 3 with fluent speech NEURO: no focal motor/sensory deficits  LABORATORY DATA:  I have reviewed the data as listed Lab Results  Component Value Date   WBC 4.9 08/11/2023   HGB 13.0 08/11/2023   HCT 38.5 (L) 08/11/2023   MCV 100.0 08/11/2023   PLT 137 (L) 08/11/2023   Recent Labs    07/18/23 1338 08/01/23 1021 08/11/23 1325  NA 129* 133* 131*  K 3.3* 4.1 4.6  CL 94* 97* 98  CO2 24 26 25   GLUCOSE 98 88 108*  BUN 16 13 15   CREATININE 0.97 0.93 1.00  CALCIUM 8.6* 9.4 8.7*  GFRNONAA >60 >60 >60  PROT 6.6 6.8 6.6  ALBUMIN 3.7 4.0 3.8  AST 25 30 26   ALT 24 21 20   ALKPHOS 78 82 70  BILITOT 1.0 0.9 0.5    RADIOGRAPHIC STUDIES: I have personally reviewed the radiological images as listed and agreed with the findings in the report. No results found.

## 2023-08-11 NOTE — Progress Notes (Signed)
Patient has been having some upper respiratory congestion that has worsened, but is usually just in the morning. Patient is doing a lot better when it comes to appetite and walking around on his own. His pain level is still the same.

## 2023-08-12 ENCOUNTER — Encounter: Payer: Self-pay | Admitting: Internal Medicine

## 2023-08-14 ENCOUNTER — Other Ambulatory Visit: Payer: Self-pay | Admitting: Internal Medicine

## 2023-08-14 ENCOUNTER — Other Ambulatory Visit: Payer: Self-pay

## 2023-08-14 DIAGNOSIS — C9000 Multiple myeloma not having achieved remission: Secondary | ICD-10-CM

## 2023-08-14 MED ORDER — IXAZOMIB CITRATE 4 MG PO CAPS
ORAL_CAPSULE | ORAL | 0 refills | Status: DC
  Filled 2023-08-14: qty 3, 28d supply, fill #0

## 2023-08-14 NOTE — Progress Notes (Signed)
Specialty Pharmacy Refill Coordination Note  Melvin Gilmore is a 83 y.o. male contacted today regarding refills of specialty medication(s) Ixazomib Citrate Melvin Gilmore)   Patient requested Delivery   Delivery date: 08/19/23   Verified address: [address]   [place] [address]   Medication will be filled on 08/18/23.   Pending refill request  This fill date is pending response to refill request from provider. Patient is aware and if they have not received fill by intended date, they must follow up with pharmacy.

## 2023-08-15 ENCOUNTER — Encounter: Payer: Self-pay | Admitting: Internal Medicine

## 2023-08-18 ENCOUNTER — Other Ambulatory Visit: Payer: Self-pay

## 2023-08-25 ENCOUNTER — Inpatient Hospital Stay: Payer: Medicare Other | Attending: Internal Medicine

## 2023-08-25 ENCOUNTER — Telehealth: Payer: Self-pay | Admitting: *Deleted

## 2023-08-25 DIAGNOSIS — R300 Dysuria: Secondary | ICD-10-CM | POA: Diagnosis not present

## 2023-08-25 DIAGNOSIS — C9000 Multiple myeloma not having achieved remission: Secondary | ICD-10-CM | POA: Insufficient documentation

## 2023-08-25 DIAGNOSIS — K219 Gastro-esophageal reflux disease without esophagitis: Secondary | ICD-10-CM | POA: Insufficient documentation

## 2023-08-25 DIAGNOSIS — K5900 Constipation, unspecified: Secondary | ICD-10-CM | POA: Insufficient documentation

## 2023-08-25 DIAGNOSIS — E785 Hyperlipidemia, unspecified: Secondary | ICD-10-CM | POA: Diagnosis not present

## 2023-08-25 DIAGNOSIS — F419 Anxiety disorder, unspecified: Secondary | ICD-10-CM | POA: Insufficient documentation

## 2023-08-25 DIAGNOSIS — Z8673 Personal history of transient ischemic attack (TIA), and cerebral infarction without residual deficits: Secondary | ICD-10-CM | POA: Insufficient documentation

## 2023-08-25 DIAGNOSIS — Z87440 Personal history of urinary (tract) infections: Secondary | ICD-10-CM | POA: Diagnosis not present

## 2023-08-25 DIAGNOSIS — E039 Hypothyroidism, unspecified: Secondary | ICD-10-CM | POA: Diagnosis not present

## 2023-08-25 DIAGNOSIS — N400 Enlarged prostate without lower urinary tract symptoms: Secondary | ICD-10-CM | POA: Diagnosis not present

## 2023-08-25 DIAGNOSIS — Z9049 Acquired absence of other specified parts of digestive tract: Secondary | ICD-10-CM | POA: Diagnosis not present

## 2023-08-25 DIAGNOSIS — R21 Rash and other nonspecific skin eruption: Secondary | ICD-10-CM | POA: Insufficient documentation

## 2023-08-25 DIAGNOSIS — I10 Essential (primary) hypertension: Secondary | ICD-10-CM | POA: Diagnosis not present

## 2023-08-25 DIAGNOSIS — R82998 Other abnormal findings in urine: Secondary | ICD-10-CM | POA: Diagnosis not present

## 2023-08-25 DIAGNOSIS — R509 Fever, unspecified: Secondary | ICD-10-CM | POA: Insufficient documentation

## 2023-08-25 DIAGNOSIS — Z8546 Personal history of malignant neoplasm of prostate: Secondary | ICD-10-CM | POA: Insufficient documentation

## 2023-08-25 DIAGNOSIS — R066 Hiccough: Secondary | ICD-10-CM | POA: Insufficient documentation

## 2023-08-25 DIAGNOSIS — D649 Anemia, unspecified: Secondary | ICD-10-CM | POA: Diagnosis not present

## 2023-08-25 DIAGNOSIS — J069 Acute upper respiratory infection, unspecified: Secondary | ICD-10-CM | POA: Diagnosis not present

## 2023-08-25 LAB — CBC WITH DIFFERENTIAL (CANCER CENTER ONLY)
Abs Immature Granulocytes: 0.02 10*3/uL (ref 0.00–0.07)
Basophils Absolute: 0 10*3/uL (ref 0.0–0.1)
Basophils Relative: 0 %
Eosinophils Absolute: 0.1 10*3/uL (ref 0.0–0.5)
Eosinophils Relative: 2 %
HCT: 37.2 % — ABNORMAL LOW (ref 39.0–52.0)
Hemoglobin: 12.5 g/dL — ABNORMAL LOW (ref 13.0–17.0)
Immature Granulocytes: 1 %
Lymphocytes Relative: 34 %
Lymphs Abs: 1.5 10*3/uL (ref 0.7–4.0)
MCH: 33.5 pg (ref 26.0–34.0)
MCHC: 33.6 g/dL (ref 30.0–36.0)
MCV: 99.7 fL (ref 80.0–100.0)
Monocytes Absolute: 0.5 10*3/uL (ref 0.1–1.0)
Monocytes Relative: 11 %
Neutro Abs: 2.2 10*3/uL (ref 1.7–7.7)
Neutrophils Relative %: 52 %
Platelet Count: 157 10*3/uL (ref 150–400)
RBC: 3.73 MIL/uL — ABNORMAL LOW (ref 4.22–5.81)
RDW: 13.9 % (ref 11.5–15.5)
WBC Count: 4.3 10*3/uL (ref 4.0–10.5)
nRBC: 0 % (ref 0.0–0.2)

## 2023-08-25 MED ORDER — LENALIDOMIDE 5 MG PO CAPS: 5.0000 mg | ORAL_CAPSULE | Freq: Every day | ORAL | 0 refills | Status: DC

## 2023-08-25 NOTE — Telephone Encounter (Signed)
Prescriber survey performed- Revlimid 5 mg 1 tablet daily x 21 days and then off for 7 days (total of 28 day cycle). Rems survey # [id number]. Pt will be required to do his patient survey. Script sent to biologics. Mychart msg sent to patient to remind him to do the survey.

## 2023-08-29 ENCOUNTER — Other Ambulatory Visit (HOSPITAL_COMMUNITY): Payer: Self-pay | Admitting: Pharmacy Technician

## 2023-08-29 ENCOUNTER — Other Ambulatory Visit: Payer: Self-pay | Admitting: Internal Medicine

## 2023-08-29 ENCOUNTER — Other Ambulatory Visit: Payer: Self-pay

## 2023-08-29 ENCOUNTER — Other Ambulatory Visit (HOSPITAL_COMMUNITY): Payer: Self-pay

## 2023-08-29 ENCOUNTER — Telehealth: Payer: Self-pay | Admitting: *Deleted

## 2023-08-29 DIAGNOSIS — C9000 Multiple myeloma not having achieved remission: Secondary | ICD-10-CM

## 2023-08-29 MED ORDER — DIPHENOXYLATE-ATROPINE 2.5-0.025 MG PO TABS: 1.0000 | ORAL_TABLET | Freq: Four times a day (QID) | ORAL | 0 refills | Status: DC | PRN

## 2023-08-29 MED ORDER — IXAZOMIB CITRATE 3 MG PO CAPS
ORAL_CAPSULE | ORAL | 0 refills | Status: DC
  Filled 2023-08-29: qty 3, 28d supply, fill #0

## 2023-08-29 NOTE — Telephone Encounter (Signed)
 The wife has called for the husband and he would like to have his treatment plan changed because of the side effects he is having he does not see the doctor until 2/17.  I called to the help wife and I asked her what was of the side effects that is bothering him and she says constipation.  She would like a call back

## 2023-08-29 NOTE — Progress Notes (Signed)
 Specialty Pharmacy Refill Coordination Note  Melvin Gilmore is a 83 y.o. male contacted today regarding refills of specialty medication(s) Ixazomib Citrate  (NINLARO )  Spoke with Wife  Patient requested Delivery   Delivery date: 09/02/23   Verified address: Patient address [address]  [place]   Medication will be filled on 09/01/23.

## 2023-09-01 ENCOUNTER — Other Ambulatory Visit: Payer: Self-pay

## 2023-09-01 ENCOUNTER — Telehealth: Payer: Self-pay | Admitting: *Deleted

## 2023-09-01 NOTE — Telephone Encounter (Signed)
 I put in notes

## 2023-09-01 NOTE — Telephone Encounter (Signed)
 I called the wife and she said that he wants to lie around.he told wife he had 2 days of diarrhea and this day she gave him imodium and so far no more diarrhea. She changed from water  to Pedialyte and Gatorade. The wife would like a [facility] tom. Around 11 if ok, it takes some time for him to get up and going. I told her that I will ask in am ,she is good with this

## 2023-09-02 ENCOUNTER — Encounter: Payer: Self-pay | Admitting: Hospice and Palliative Medicine

## 2023-09-02 ENCOUNTER — Inpatient Hospital Stay (HOSPITAL_BASED_OUTPATIENT_CLINIC_OR_DEPARTMENT_OTHER): Payer: Medicare Other | Admitting: Hospice and Palliative Medicine

## 2023-09-02 ENCOUNTER — Other Ambulatory Visit: Payer: Self-pay

## 2023-09-02 ENCOUNTER — Inpatient Hospital Stay: Payer: Medicare Other

## 2023-09-02 ENCOUNTER — Telehealth: Payer: Self-pay | Admitting: *Deleted

## 2023-09-02 VITALS — BP 126/77 | HR 95 | Temp 98.0°F | Wt 146.0 lb

## 2023-09-02 DIAGNOSIS — R509 Fever, unspecified: Secondary | ICD-10-CM | POA: Diagnosis not present

## 2023-09-02 DIAGNOSIS — C9000 Multiple myeloma not having achieved remission: Secondary | ICD-10-CM

## 2023-09-02 DIAGNOSIS — R300 Dysuria: Secondary | ICD-10-CM

## 2023-09-02 DIAGNOSIS — J069 Acute upper respiratory infection, unspecified: Secondary | ICD-10-CM

## 2023-09-02 LAB — RESPIRATORY PANEL BY PCR

## 2023-09-02 LAB — CBC WITH DIFFERENTIAL/PLATELET
Abs Immature Granulocytes: 0.01 10*3/uL (ref 0.00–0.07)
Basophils Absolute: 0 10*3/uL (ref 0.0–0.1)
Basophils Relative: 0 %
Eosinophils Absolute: 0.1 10*3/uL (ref 0.0–0.5)
Eosinophils Relative: 1 %
HCT: 39.7 % (ref 39.0–52.0)
Hemoglobin: 13.1 g/dL (ref 13.0–17.0)
Immature Granulocytes: 0 %
Lymphocytes Relative: 42 %
Lymphs Abs: 2 10*3/uL (ref 0.7–4.0)
MCH: 32.9 pg (ref 26.0–34.0)
MCHC: 33 g/dL (ref 30.0–36.0)
MCV: 99.7 fL (ref 80.0–100.0)
Monocytes Absolute: 0.6 10*3/uL (ref 0.1–1.0)
Monocytes Relative: 12 %
Neutro Abs: 2.1 10*3/uL (ref 1.7–7.7)
Neutrophils Relative %: 45 %
Platelets: 176 10*3/uL (ref 150–400)
RBC: 3.98 MIL/uL — ABNORMAL LOW (ref 4.22–5.81)
RDW: 14.1 % (ref 11.5–15.5)
Smear Review: NORMAL
WBC: 4.8 10*3/uL (ref 4.0–10.5)
nRBC: 0 % (ref 0.0–0.2)

## 2023-09-02 LAB — COMPREHENSIVE METABOLIC PANEL
ALT: 18 U/L (ref 0–44)
AST: 31 U/L (ref 15–41)
Albumin: 3.3 g/dL — ABNORMAL LOW (ref 3.5–5.0)
Alkaline Phosphatase: 71 U/L (ref 38–126)
Anion gap: 8 (ref 5–15)
BUN: 11 mg/dL (ref 8–23)
CO2: 27 mmol/L (ref 22–32)
Calcium: 9.1 mg/dL (ref 8.9–10.3)
Chloride: 99 mmol/L (ref 98–111)
Creatinine, Ser: 1.18 mg/dL (ref 0.61–1.24)
GFR, Estimated: >60 mL/min (ref >60)
Glucose, Bld: 123 mg/dL — ABNORMAL HIGH (ref 70–99)
Potassium: 4 mmol/L (ref 3.5–5.1)
Sodium: 134 mmol/L — ABNORMAL LOW (ref 135–145)
Total Bilirubin: 0.6 mg/dL (ref 0.0–1.2)
Total Protein: 7 g/dL (ref 6.5–8.1)

## 2023-09-02 LAB — URINALYSIS, COMPLETE (UACMP) WITH MICROSCOPIC
Bilirubin Urine: NEGATIVE
Glucose, UA: NEGATIVE mg/dL
Hgb urine dipstick: NEGATIVE
Ketones, ur: NEGATIVE mg/dL
Leukocytes,Ua: NEGATIVE
Nitrite: NEGATIVE
Protein, ur: NEGATIVE mg/dL
RBC / HPF: 0 RBC/hpf (ref 0–5)
Specific Gravity, Urine: 1.011 (ref 1.005–1.030)
Squamous Epithelial / HPF: 0 /[HPF] (ref 0–5)
WBC, UA: 0 WBC/hpf (ref 0–5)
pH: 6 (ref 5.0–8.0)

## 2023-09-02 LAB — MAGNESIUM: Magnesium: 2.3 mg/dL (ref 1.7–2.4)

## 2023-09-02 NOTE — Telephone Encounter (Signed)
Pt to come today at 10:45 for labs and then see Josh. Wife is agreeable for that.

## 2023-09-02 NOTE — Progress Notes (Signed)
Symptom Management Clinic [facility] at [facility] Telephone:([phone] Fax:([phone]) [phone]  Patient Care Team: [name], MD as PCP - General (Internal Medicine) [name], MD as Consulting Physician (Oncology)   NAME OF PATIENT: Melvin Gilmore  [id number]  July 26, 1940   DATE OF VISIT: 09/02/23  REASON FOR CONSULT: Melvin Gilmore is a 83 y.o. male with multiple medical problems including hypertension, hyperlipidemia, anxiety, GERD, BPH, hypothyroidism, history of CVA,, pathologic compression fractures, prostate cancer status post HIFU, and multiple myeloma.   INTERVAL HISTORY: Patient on treatment with for multiple myeloma.  Patient presents [facility] today for evaluation of rhinorrhea and nasal congestion, low-grade fever (Tmax 100.0), and diarrhea.  Symptoms started 3 days ago.  Patient also fatigued.  Denies muscle aches.  Denies sore throat.  Denies cough, congestion, shortness of breath.  Had an episode of loose stools but that has resolved after taking Imodium.  Patient states that he has remained congested with postnasal drip.  Denies known sick contacts.  Endorses malodorous urine but denies dysuria, urinary frequency or urgency.  Denies any neurologic complaints. Denies any easy bleeding or bruising. Reports good appetite and denies weight loss. Denies chest pain. Patient offers no further specific complaints today.  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   Nausea vomiting and diarrhea  01/26/2023   Prostate cancer (HCC) 2023   Seasonal allergies    Skin cancer    a.) ears   Wears partial dentures    lower    PAST SURGICAL HISTORY:  Past Surgical History:  Procedure Laterality Date   APPENDECTOMY     CATARACT EXTRACTION W/PHACO Right 01/15/2016   Procedure: CATARACT EXTRACTION PHACO AND INTRAOCULAR LENS PLACEMENT (IOC);  Surgeon: Sherald Hess, MD;  Location: [facility];  Service: Ophthalmology;  Laterality: Right;  RIGHT CALL CELL PHONE WITH TIME   CHOLECYSTECTOMY     COLONOSCOPY     COLONOSCOPY WITH ESOPHAGOGASTRODUODENOSCOPY (EGD)     ESOPHAGOGASTRODUODENOSCOPY  07/2021   ESOPHAGOGASTRODUODENOSCOPY (EGD) WITH PROPOFOL N/A 09/23/2017   Procedure: ESOPHAGOGASTRODUODENOSCOPY (EGD) WITH PROPOFOL;  Surgeon: Toledo, Boykin Nearing, MD;  Location: [facility];  Service: Gastroenterology;  Laterality: N/A;   HIGH INTENSITY FOCUSED ULTRASOUND (HIFU) OF THE PROSTATE N/A 02/07/2022   Procedure: HIGH INTENSITY FOCUSED ULTRASOUND (HIFU) OF THE PROSTATE;  Surgeon: Orson Ape, MD;  Location: [facility];  Service: Urology;  Laterality: N/A;   HYDROCELE EXCISION / REPAIR     IR KYPHO THORACIC WITH BONE BIOPSY  12/06/2022   IR RADIOLOGIST EVAL & MGMT  11/21/2022   LASIX RT EYE     PROSTATE BIOPSY N/A 01/03/2022   Procedure: PROSTATE BIOPSY  Addison Yagi;  Surgeon: Orson Ape, MD;  Location: [facility];  Service: Urology;  Laterality: N/A;   ROTATOR CUFF REPAIR Left 2001    HEMATOLOGY/ONCOLOGY HISTORY:  Oncology History  Multiple myeloma (HCC)  12/05/2022 Cancer Staging   Staging form: Plasma Cell  Myeloma and Plasma Cell Disorders, AJCC 8th Edition - Clinical stage from 12/05/2022: RISS Stage II (Beta-2-microglobulin (mg/L): 3.7, Albumin (g/dL): 3.6, ISS: Stage II, High-risk cytogenetics: Absent, LDH: Normal) - Signed by [name], MD on 12/26/2022 Stage prefix: Initial diagnosis Beta 2 microglobulin range (mg/L): 3.5 to 5.49 Albumin range (g/dL): Greater  than or equal to 3.5 Cytogenetics: 1q addition, Other mutation   12/13/2022 Initial Diagnosis   Multiple myeloma (HCC)   12/26/2022 - 01/24/2023 Chemotherapy   Patient is on Treatment Plan : MYELOMA NEWLY DIAGNOSED Daratumumab IV + Lenalidomide + Dexamethasone Weekly (DaraRd) q28d     02/07/2023 - 02/07/2023 Chemotherapy   Patient is on Treatment Plan : MYELOMA NON-TRANSPLANT CANDIDATES VRd weekly q21d     02/13/2023 - 06/09/2023 Chemotherapy   Patient is on Treatment Plan : MYELOMA NEWLY DIAGNOSED Daratumumab IV + Lenalidomide + Dexamethasone Weekly (DaraRd) q28d     06/13/2023 - 06/23/2023 Chemotherapy   Patient is on Treatment Plan : MYELOMA  RVD SQ q21d x 4 cycles       ALLERGIES:  is allergic to ciprofloxacin, amoxicillin-pot clavulanate, meloxicam, and trimethoprim.  MEDICATIONS:  Current Outpatient Medications  Medication Sig Dispense Refill   acetaminophen (TYLENOL) 500 MG tablet Take 500 mg by mouth every 6 (six) hours as needed for mild pain, moderate pain, fever or headache.     acyclovir (ZOVIRAX) 400 MG tablet Take 400 mg by mouth 2 (two) times daily.     ALPRAZolam (XANAX) 0.25 MG tablet Take 1 tablet (0.25 mg total) by mouth at bedtime as needed for anxiety. 30 tablet 0   ixazomib citrate (NINLARO) 3 MG capsule Take 1 capsule (3 mg) by mouth weekly, 3 weeks on, 1 week off, repeat every 4 weeks. Take on an empty stomach 1hr before or 2hr after meals. 3 capsule 0   lenalidomide (REVLIMID) 5 MG capsule Take 1 capsule (5 mg total) by mouth daily. Take for 21 days, then hold for 7 days. Repeat every 28 days. 21 capsule 0   levothyroxine (SYNTHROID, LEVOTHROID) 50 MCG tablet Take 50 mcg by mouth daily before breakfast.     metoCLOPramide (REGLAN) 10 MG tablet Take 1 tablet (10 mg total) by mouth every 8 (eight) hours as needed for up to 10 doses (hiccups). Take one pill 1 hour prior to taking Decadron. 10 tablet 0   pantoprazole (PROTONIX) 40 MG tablet Take 40 mg by mouth daily as  needed.     Probiotic Product (PROBIOTIC ADVANCED PO) Take 1 tablet by mouth at bedtime.     senna (SENOKOT) 8.6 MG tablet Take 2 tablets (17.2 mg total) by mouth 2 (two) times daily. May crush, mix with water and give sublingually if needed. 28 tablet 0   sulfamethoxazole-trimethoprim (BACTRIM DS) 800-160 MG tablet Take 1 tablet by mouth every 12 (twelve) hours. 60 tablet 0   traZODone (DESYREL) 50 MG tablet Take by mouth.     trimethoprim (TRIMPEX) 100 MG tablet Take 1 tablet (100 mg total) by mouth daily. 90 tablet 0   zolpidem (AMBIEN) 5 MG tablet TAKE 1 TABLET BY MOUTH AT BEDTIME AS NEEDED FOR SLEEP. 30 tablet 0   atorvastatin (LIPITOR) 40 MG tablet Take 1 tablet (40 mg total) by mouth daily. (Patient not taking: Reported on 07/02/2023) 30 tablet 2   diphenoxylate-atropine (LOMOTIL) 2.5-0.025 MG tablet Take 1 tablet by mouth 4 (four) times daily as needed for diarrhea or loose stools. (Patient not taking: Reported on 09/02/2023) 30 tablet 0   hydrOXYzine (  ATARAX) 25 MG tablet Take 1 tablet by mouth at bedtime. (Patient not taking: Reported on 07/02/2023)     pravastatin (PRAVACHOL) 10 MG tablet Take 1 tablet by mouth at bedtime. (Patient not taking: Reported on 07/02/2023)     No current facility-administered medications for this visit.    VITAL SIGNS: BP 126/77 (BP Location: Left Leg, Patient Position: Sitting, Cuff Size: Normal)   Pulse 95   Temp 98 F (36.7 C) (Tympanic)   Wt 146 lb (66.2 kg)   SpO2 99%   BMI 20.36 kg/m  Filed Weights   09/02/23 1102  Weight: 146 lb (66.2 kg)    Estimated body mass index is 20.36 kg/m as calculated from the following:   Height as of 07/02/23: 5\' 11"  (1.803 m).   Weight as of this encounter: 146 lb (66.2 kg).  LABS: CBC:    Component Value Date/Time   WBC 4.3 08/25/2023 1402   WBC 4.9 08/11/2023 1325   HGB 12.5 (L) 08/25/2023 1402   HCT 37.2 (L) 08/25/2023 1402   PLT 157 08/25/2023 1402   MCV 99.7 08/25/2023 1402   NEUTROABS 2.2  08/25/2023 1402   LYMPHSABS 1.5 08/25/2023 1402   MONOABS 0.5 08/25/2023 1402   EOSABS 0.1 08/25/2023 1402   BASOSABS 0.0 08/25/2023 1402   Comprehensive Metabolic Panel:    Component Value Date/Time   NA 131 (L) 08/11/2023 1325   K 4.6 08/11/2023 1325   CL 98 08/11/2023 1325   CO2 25 08/11/2023 1325   BUN 15 08/11/2023 1325   CREATININE 1.00 08/11/2023 1325   CREATININE 0.93 08/01/2023 1021   GLUCOSE 108 (H) 08/11/2023 1325   CALCIUM 8.7 (L) 08/11/2023 1325   AST 26 08/11/2023 1325   AST 30 08/01/2023 1021   ALT 20 08/11/2023 1325   ALT 21 08/01/2023 1021   ALKPHOS 70 08/11/2023 1325   BILITOT 0.5 08/11/2023 1325   BILITOT 0.9 08/01/2023 1021   PROT 6.6 08/11/2023 1325   ALBUMIN 3.8 08/11/2023 1325    RADIOGRAPHIC STUDIES: No results found.  PERFORMANCE STATUS (ECOG) : 1 - Symptomatic but completely ambulatory  Review of Systems Unless otherwise noted, a complete review of systems is negative.  Physical Exam General: NAD Cardiovascular: regular rate and rhythm Pulmonary: clear ant fields Abdomen: soft, nontender, + bowel sounds GU: no suprapubic tenderness Extremities: no edema, no joint deformities Skin: no rashes Neurological: Weakness but otherwise nonfocal  IMPRESSION/PLAN: Multiple myeloma -on treatment with Ixazomib   URI -symptoms suggestive of viral illness.  Will send viral respiratory panel.  No indication for antibiotics at present.  Discussed symptomatic care and OTC medications.  Patient feels like he is drinking adequate fluids.  Labs not indicative of dehydration or metabolic derangements.  Malodorous urine -will send UA/culture given history of recurrent UTIs  Case and plan discussed with Dr. Alena Bills   Patient expressed understanding and was in agreement with this plan. He also understands that He can call clinic at any time with any questions, concerns, or complaints.   Thank you for allowing me to participate in the care of this very  pleasant patient.   Time Total: 20 minutes  Visit consisted of counseling and education dealing with the complex and emotionally intense issues of symptom management in the setting of serious illness.Greater than 50%  of this time was spent counseling and coordinating care related to the above assessment and plan.  Signed by: Laurette Schimke, PhD, NP-C

## 2023-09-03 ENCOUNTER — Telehealth: Payer: Self-pay

## 2023-09-03 LAB — URINE CULTURE: Culture: NO GROWTH

## 2023-09-03 NOTE — Telephone Encounter (Signed)
Called and spoke with wife, [name], and advised that patients UA was clear and respiratory panel was negative as well. Advised to continue supportive care. Wife verbalized understanding.

## 2023-09-08 ENCOUNTER — Inpatient Hospital Stay: Payer: Medicare Other | Admitting: Internal Medicine

## 2023-09-08 ENCOUNTER — Inpatient Hospital Stay: Payer: Medicare Other

## 2023-09-08 ENCOUNTER — Encounter: Payer: Self-pay | Admitting: Internal Medicine

## 2023-09-08 VITALS — BP 119/64 | HR 97 | Temp 99.3°F | Resp 14 | Wt 144.0 lb

## 2023-09-08 DIAGNOSIS — Z7983 Long term (current) use of bisphosphonates: Secondary | ICD-10-CM | POA: Diagnosis not present

## 2023-09-08 DIAGNOSIS — C9000 Multiple myeloma not having achieved remission: Secondary | ICD-10-CM

## 2023-09-08 LAB — CBC WITH DIFFERENTIAL/PLATELET
Abs Immature Granulocytes: 0.03 10*3/uL (ref 0.00–0.07)
Basophils Absolute: 0 10*3/uL (ref 0.0–0.1)
Basophils Relative: 0 %
Eosinophils Absolute: 0.1 10*3/uL (ref 0.0–0.5)
Eosinophils Relative: 1 %
HCT: 39.6 % (ref 39.0–52.0)
Hemoglobin: 13.4 g/dL (ref 13.0–17.0)
Immature Granulocytes: 1 %
Lymphocytes Relative: 35 %
Lymphs Abs: 1.7 10*3/uL (ref 0.7–4.0)
MCH: 33.5 pg (ref 26.0–34.0)
MCHC: 33.8 g/dL (ref 30.0–36.0)
MCV: 99 fL (ref 80.0–100.0)
Monocytes Absolute: 0.6 10*3/uL (ref 0.1–1.0)
Monocytes Relative: 13 %
Neutro Abs: 2.4 10*3/uL (ref 1.7–7.7)
Neutrophils Relative %: 50 %
Platelets: 167 10*3/uL (ref 150–400)
RBC: 4 MIL/uL — ABNORMAL LOW (ref 4.22–5.81)
RDW: 14 % (ref 11.5–15.5)
Smear Review: NORMAL
WBC: 4.8 10*3/uL (ref 4.0–10.5)
nRBC: 0 % (ref 0.0–0.2)

## 2023-09-08 LAB — COMPREHENSIVE METABOLIC PANEL
ALT: 17 U/L (ref 0–44)
AST: 42 U/L — ABNORMAL HIGH (ref 15–41)
Albumin: 3.4 g/dL — ABNORMAL LOW (ref 3.5–5.0)
Alkaline Phosphatase: 83 U/L (ref 38–126)
Anion gap: 11 (ref 5–15)
BUN: 15 mg/dL (ref 8–23)
CO2: 26 mmol/L (ref 22–32)
Calcium: 9.3 mg/dL (ref 8.9–10.3)
Chloride: 99 mmol/L (ref 98–111)
Creatinine, Ser: 1.15 mg/dL (ref 0.61–1.24)
GFR, Estimated: >60 mL/min (ref >60)
Glucose, Bld: 92 mg/dL (ref 70–99)
Potassium: 4.2 mmol/L (ref 3.5–5.1)
Sodium: 136 mmol/L (ref 135–145)
Total Bilirubin: 0.6 mg/dL (ref 0.0–1.2)
Total Protein: 8.1 g/dL (ref 6.5–8.1)

## 2023-09-08 MED ORDER — ZOLEDRONIC ACID 4 MG/5ML IV CONC
3.3000 mg | Freq: Once | INTRAVENOUS | Status: AC
  Administered 2023-09-08: 3.3 mg via INTRAVENOUS
  Filled 2023-09-08: qty 4.13

## 2023-09-08 MED ORDER — SODIUM CHLORIDE 0.9 % IV SOLN
INTRAVENOUS | Status: DC
  Filled 2023-09-08: qty 250

## 2023-09-08 NOTE — Progress Notes (Signed)
With the his last Zometa treatment he had some bad body aches, he asked if there was something he could take to maybe help. Fatigue is a big issue, he is just wanting to sleep a lot more.

## 2023-09-08 NOTE — Patient Instructions (Signed)

## 2023-09-08 NOTE — Progress Notes (Signed)
[name] CONSULT NOTE  Patient Care Team: [name], MD as PCP - General (Internal Medicine) [name], MD as Consulting Physician (Oncology)  CANCER STAGING   Cancer Staging  Multiple myeloma [facility]) Staging form: Plasma Cell Myeloma and Plasma Cell Disorders, AJCC 8th Edition - Clinical stage from 12/05/2022: RISS Stage II (Beta-2-microglobulin (mg/L): 3.7, Albumin (g/dL): 3.6, ISS: Stage II, High-risk cytogenetics: Absent, LDH: Normal) - Signed by [name], MD on 12/26/2022 Stage prefix: Initial diagnosis Beta 2 microglobulin range (mg/L): 3.5 to 5.49 Albumin range (g/dL): Greater than or equal to 3.5 Cytogenetics: 1q addition, Other mutation   ASSESSMENT & PLAN:  Melvin Gilmore 83 y.o. male with pmh of hypertension, hyperlipidemia, anxiety, GERD, BPH, hypothyroidism, prostate cancer s/p HIFU was seen by primary on November 13, 2022 for acute onset of lower back pain follows with medical oncology for management of multiple myeloma.  # Multiple myeloma IgA, RISS Stage II, high risk -MRI thoracic and lumbar spine with and without contrast was reviewed. Showed unchanged subacute compression fracture at T12.  Focal enhancing marrow replacing lesions in T6, T8, T5, T2 all suspicious for metastatic disease.  L1 lesion measuring 16 mm.  Multilevel degenerative changes present.  -SPEP/IFE showed biclonal IgA kappa paraprotein with M spike 2.1 g/dL and second spike at 0.4 g/dL.  Kappa 453, lambda 6.8 with a ratio of 66.7.  Iron panel and B12 are normal.  PSA 3.44.  CBC showed mild anemia 13.4, chronic.  On CMP normal creatinine and calcium levels.  Elevated total protein 8.8.  LDH normal.  Beta-2 microglobulin 3.7.  Albumin 3.6.  24-hour urine/UPEP showed 1.1 g protein with no M spike.  -BMBx Hypercellular marrow with 60% plasma cells.  IHC positive for CD138/ mum 1.  Kappa restricted consistent with multiple myeloma. L1 vertebral body biopsy showed numerous plasma  cells. Cytogenetics - del (13q) and gain 1.    -PET/CT showed activity in L1 and left eighth rib concerning for myeloma.  -Started on Dara-RD on 12/26/2022.  Completed cycle 1 of daratumumab ->hospitalized for urinary retention, constipation and back pain -> inpatient hospitalist transition him to hospice -> patient revoked hospice on 01/31/2023 since he was feeling better.  Multiple treatment interruptions due to UTIs and hospitalization for stroke.  -Completed cycle 5-day 1 of daratumumab on 06/09/2023.  Progressed with increase in kappa light chain.  Changed to VRD on 06/20/2023.  With C1D1 of Velcade he had rigors.  Premedicated with Tylenol and Benadryl with C1D8.  4 hours postinfusion had severe rigors, hallucinations, weakness.  Patient never wants to go back on Velcade.  -Started on Ixazomib 4 mg once weekly 3 weeks on and 1 week off on 07/04/2023. Changed Ixazomib to 3 mg on 2/12 due to Grade 2 (bothersome to patient). Did not have any diarrhea with 3 mg dose.   - Revlimid 5 mg 3 weeks on 1 week off. Cannot tolerate 10 mg dose due to severe neutropenia. Continue with ASA 81 mg and Acyclovir. Continue with Dex 2 mg for 4 days weekly 3-week on 1 week off.  Cannot tolerate higher dose due to intractable hiccups.  -Labs reviewed and acceptable for treatment.  Proceed with Zometa today.   # Recurrent UTIs -Follows with Dr. Lonna Cobb.  On trimethoprim.    # Stroke - Admitted 03/07/2023 with acute onset aphasia and right hemianopia. s/p tPA with complete resolution of symptoms.  MRI brain negative.  Echo unremarkable.  On aspirin and Plavix for 21 days  then aspirin alone.  On Lipitor 40 mg daily.    # Intractable hiccups -Secondary to dexamethasone.  # Vertebral compression fractures -Dental clearance obtained. - IV Zometa 3 mg (started on 06/09/2023).  Every 3 months.   # T12 pathological fracture # Cancer-related pain -s/p kyphoplasty by Dr. Elijio Miles on 12/06/22.   -Completed palliative RT  on 01/20/2023.  -MRI spine performed From 06/27/2023 shows progression of multiple myeloma with innumerable lesions throughout the covered skeleton since July 2024.  Compression fractures seen on prior have healed.  No acute fracture. Continue with brace PRN.  Can use hemp cream OTC.  Continues to decline oral opioids.  Continue with Tylenol.  # Constipation -Manageable with senna, MiraLAX, lactulose, Movantik as needed.  # History of prostate cancer - follows with Dr. Evelene Croon, urology for elevated PSA.  MRI prostate showed 1 mm category 5 lesion of anterior transition zone.  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 s/p HIFU of the prostate on 02/07/2022.  PSA level from 10/08/2022 was 3.34.  -PSA from 11/21/2022 3.44.  # Anxiety -He is taking Ambien 5 mg nightly prescribed by PCP.  Understands the risk of increased falls/confusion.  t really helps him with sleep and anxiety so he would like to continue it.  Orders Placed This Encounter  Procedures   CBC with Differential (Cancer Center Only)    Standing Status:   Future    Expected Date:   10/06/2023    Expiration Date:   09/07/2024   CMP (Cancer Center only)    Standing Status:   Future    Expected Date:   10/06/2023    Expiration Date:   09/07/2024   Kappa/lambda light chains    Standing Status:   Future    Expected Date:   10/06/2023    Expiration Date:   09/07/2024   Multiple Myeloma Panel (SPEP&IFE w/QIG)    Standing Status:   Future    Expected Date:   10/06/2023    Expiration Date:   09/07/2024   RTC in 4 weeks for MD visit, labs  The total time spent in the appointment was 30 minutes encounter with patients including review of chart and various tests results, discussions about plan of care and coordination of care plan   All questions were answered. The patient knows to call the clinic with any problems, questions or concerns. No barriers to learning was detected.  [name], MD 2/17/20253:12 PM   HISTORY OF PRESENTING ILLNESS:  Melvin Gilmore 83 y.o. male with past medical history of hypertension, hyperlipidemia, anxiety, GERD, BPH, hypothyroidism, prostate cancer s/p HIFU was seen by primary on November 13, 2022 for acute onset of lower back pain.  Patient reports that he was sitting outside and had violent sneeze which was followed by severe back pain.  He has fallen allergies.  Had x-rays followed by MRI thoracic spine without contrast.  It showed compression fracture of T12 superior endplate with surrounding marrow edema likely subacute.  15 mm T1 hypointense marrow lesion in the anterior aspect of L1 suspicious for metastasis.  Further characterization with postcontrast MRI recommended.  Prostate cancer-follows with Dr. Evelene Croon, urology for elevated PSA.  MRI prostate showed 1 mm category 5 lesion of anterior transition zone.  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 s/p HIFU of the prostate on 02/07/2022.  PSA level from 10/08/2022 was 3.34.  Interval history Patient was seen today accompanied  with his wife on  Ixazomib, Revlimid and Dex.  Overall tolerating treatment well.  Patient's wife called a couple of weeks ago because of 5-6 episodes of diarrhea lasting for 2 days despite taking Imodium.  It is bothering him and requesting for options with the treatment.  He was also seen last week with low-grade temp of 100 start likely from URI which is resolved now.  Major concern today was fatigue.  He is having some pain muscles.  He has been exercising.  I have reviewed his chart and materials related to his cancer extensively and collaborated history with the patient. Summary of oncologic history is as follows: Oncology History  Multiple myeloma (HCC)  12/05/2022 Cancer Staging   Staging form: Plasma Cell Myeloma and Plasma Cell Disorders, AJCC 8th Edition - Clinical stage from 12/05/2022: RISS Stage II  (Beta-2-microglobulin (mg/L): 3.7, Albumin (g/dL): 3.6, ISS: Stage II, High-risk cytogenetics: Absent, LDH: Normal) - Signed by [name], MD on 12/26/2022 Stage prefix: Initial diagnosis Beta 2 microglobulin range (mg/L): 3.5 to 5.49 Albumin range (g/dL): Greater than or equal to 3.5 Cytogenetics: 1q addition, Other mutation   12/13/2022 Initial Diagnosis   Multiple myeloma (HCC)   12/26/2022 - 01/24/2023 Chemotherapy   Patient is on Treatment Plan : MYELOMA NEWLY DIAGNOSED Daratumumab IV + Lenalidomide + Dexamethasone Weekly (DaraRd) q28d     02/07/2023 - 02/07/2023 Chemotherapy   Patient is on Treatment Plan : MYELOMA NON-TRANSPLANT CANDIDATES VRd weekly q21d     02/13/2023 - 06/09/2023 Chemotherapy   Patient is on Treatment Plan : MYELOMA NEWLY DIAGNOSED Daratumumab IV + Lenalidomide + Dexamethasone Weekly (DaraRd) q28d     06/13/2023 - 06/23/2023 Chemotherapy   Patient is on Treatment Plan : MYELOMA  RVD SQ q21d x 4 cycles       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   Nausea vomiting and diarrhea 01/26/2023   Prostate cancer (HCC) 2023   Seasonal allergies    Skin cancer    a.) ears   Wears partial dentures    lower    SURGICAL HISTORY: Past Surgical History:  Procedure Laterality Date   APPENDECTOMY     CATARACT EXTRACTION W/PHACO Right 01/15/2016   Procedure: CATARACT EXTRACTION PHACO AND INTRAOCULAR LENS PLACEMENT (IOC);  Surgeon: Sherald Hess, MD;  Location: [facility];  Service: Ophthalmology;   Laterality: Right;  RIGHT CALL CELL PHONE WITH TIME   CHOLECYSTECTOMY     COLONOSCOPY     COLONOSCOPY WITH ESOPHAGOGASTRODUODENOSCOPY (EGD)     ESOPHAGOGASTRODUODENOSCOPY  07/2021   ESOPHAGOGASTRODUODENOSCOPY (EGD) WITH PROPOFOL N/A 09/23/2017   Procedure: ESOPHAGOGASTRODUODENOSCOPY (EGD) WITH PROPOFOL;  Surgeon: Toledo, Boykin Nearing, MD;  Location: [facility];  Service: Gastroenterology;  Laterality: N/A;   HIGH INTENSITY FOCUSED ULTRASOUND (HIFU) OF THE PROSTATE N/A 02/07/2022   Procedure: HIGH INTENSITY FOCUSED ULTRASOUND (HIFU) OF THE PROSTATE;  Surgeon: Orson Ape, MD;  Location: [facility];  Service: Urology;  Laterality:  N/A;   HYDROCELE EXCISION / REPAIR     IR KYPHO THORACIC WITH BONE BIOPSY  12/06/2022   IR RADIOLOGIST EVAL & MGMT  11/21/2022   LASIX RT EYE     PROSTATE BIOPSY N/A 01/03/2022   Procedure: PROSTATE BIOPSY  Addison Exton;  Surgeon: Orson Ape, MD;  Location: [facility];  Service: Urology;  Laterality: N/A;   ROTATOR CUFF REPAIR Left 2001    SOCIAL HISTORY: Social History   Socioeconomic History   Marital status: Married    Spouse name: Not on file   Number of children: Not on file   Years of education: Not on file   Highest education level: Not on file  Occupational History   Not on file  Tobacco Use   Smoking status: Never   Smokeless tobacco: Never  Vaping Use   Vaping status: Never Used  Substance and Sexual Activity   Alcohol use: Yes    Alcohol/week: 7.0 standard drinks of alcohol    Types: 7 Glasses of wine per week    Comment: wine occ   Drug use: No   Sexual activity: Not on file  Other Topics Concern   Not on file  Social History Narrative   Not on file   Social Drivers of Health   Financial Resource Strain: Low Risk  (06/11/2023)   Received from [facility]   Overall Financial Resource Strain (CARDIA)    Difficulty of Paying Living Expenses: Not hard at all  Food Insecurity: No Food Insecurity (06/11/2023)    [facility]   Hunger Vital Sign    Worried About Running Out of Food in the Last Year: Never true    Ran Out of Food in the Last Year: Never true  Transportation Needs: No Transportation Needs (06/11/2023)   Received from [facility] - Transportation    In the past 12 months, has lack of transportation kept you from medical appointments or from getting medications?: No    Lack of Transportation (Non-Medical): No  Physical Activity: Not on file  Stress: Not on file  Social Connections: Not on file  Intimate Partner Violence: Patient Unable To Answer (03/07/2023)   Humiliation, Afraid, Rape, and Kick questionnaire    Fear of Current or Ex-Partner: Patient unable to answer    Emotionally Abused: Patient unable to answer    Physically Abused: Patient unable to answer    Sexually Abused: Patient unable to answer    FAMILY HISTORY: Family History  Problem Relation Age of Onset   Pneumonia Father     ALLERGIES:  is allergic to ciprofloxacin, amoxicillin-pot clavulanate, meloxicam, and trimethoprim.  MEDICATIONS:  Current Outpatient Medications  Medication Sig Dispense Refill   acetaminophen (TYLENOL) 500 MG tablet Take 500 mg by mouth every 6 (six) hours as needed for mild pain, moderate pain, fever or headache.     acyclovir (ZOVIRAX) 400 MG tablet Take 400 mg by mouth 2 (two) times daily.     ALPRAZolam (XANAX) 0.25 MG tablet Take 1 tablet (0.25 mg total) by mouth at bedtime as needed for anxiety. 30 tablet 0   atorvastatin (LIPITOR) 40 MG tablet Take 1 tablet (40 mg total) by mouth daily. (Patient not taking: Reported on 07/02/2023) 30 tablet 2   diphenoxylate-atropine (LOMOTIL) 2.5-0.025 MG tablet Take 1 tablet by mouth 4 (four) times daily as needed for diarrhea or loose stools. (Patient not taking: Reported on 09/02/2023) 30 tablet 0  hydrOXYzine (ATARAX) 25 MG tablet Take 1 tablet by mouth at bedtime. (Patient not taking:  Reported on 07/02/2023)     ixazomib citrate (NINLARO) 3 MG capsule Take 1 capsule (3 mg) by mouth weekly, 3 weeks on, 1 week off, repeat every 4 weeks. Take on an empty stomach 1hr before or 2hr after meals. 3 capsule 0   lenalidomide (REVLIMID) 5 MG capsule Take 1 capsule (5 mg total) by mouth daily. Take for 21 days, then hold for 7 days. Repeat every 28 days. 21 capsule 0   levothyroxine (SYNTHROID, LEVOTHROID) 50 MCG tablet Take 50 mcg by mouth daily before breakfast.     metoCLOPramide (REGLAN) 10 MG tablet Take 1 tablet (10 mg total) by mouth every 8 (eight) hours as needed for up to 10 doses (hiccups). Take one pill 1 hour prior to taking Decadron. 10 tablet 0   pantoprazole (PROTONIX) 40 MG tablet Take 40 mg by mouth daily as needed.     pravastatin (PRAVACHOL) 10 MG tablet Take 1 tablet by mouth at bedtime. (Patient not taking: Reported on 07/02/2023)     Probiotic Product (PROBIOTIC ADVANCED PO) Take 1 tablet by mouth at bedtime.     senna (SENOKOT) 8.6 MG tablet Take 2 tablets (17.2 mg total) by mouth 2 (two) times daily. May crush, mix with water and give sublingually if needed. 28 tablet 0   sulfamethoxazole-trimethoprim (BACTRIM DS) 800-160 MG tablet Take 1 tablet by mouth every 12 (twelve) hours. 60 tablet 0   traZODone (DESYREL) 50 MG tablet Take by mouth.     trimethoprim (TRIMPEX) 100 MG tablet Take 1 tablet (100 mg total) by mouth daily. 90 tablet 0   zolpidem (AMBIEN) 5 MG tablet TAKE 1 TABLET BY MOUTH AT BEDTIME AS NEEDED FOR SLEEP. 30 tablet 0   No current facility-administered medications for this visit.   Facility-Administered Medications Ordered in Other Visits  Medication Dose Route Frequency Provider Last Rate Last Admin   0.9 %  sodium chloride infusion   Intravenous Continuous [name], MD       zoledronic acid (ZOMETA) 3.3 mg in sodium chloride 0.9 % 100 mL IVPB  3.3 mg Intravenous Once [name], MD        REVIEW OF SYSTEMS:   Pertinent information  mentioned in HPI All other systems were reviewed with the patient and are negative.  PHYSICAL EXAMINATION: ECOG PERFORMANCE STATUS: 1 - Symptomatic but completely ambulatory  Vitals:   09/08/23 1421  BP: 119/64  Pulse: 97  Resp: 14  Temp: 99.3 F (37.4 C)  SpO2: 98%      Filed Weights   09/08/23 1421  Weight: 144 lb (65.3 kg)       GENERAL:alert, no distress and comfortable SKIN: skin color, texture, turgor are normal, no rashes or significant lesions EYES: normal, conjunctiva are pink and non-injected, sclera clear OROPHARYNX:no exudate, no erythema and lips, buccal mucosa, and tongue normal  NECK: supple, thyroid normal size, non-tender, without nodularity LYMPH:  no palpable lymphadenopathy in the cervical, axillary or inguinal LUNGS: clear to auscultation and percussion with normal breathing effort HEART: regular rate & rhythm and no murmurs and no lower extremity edema ABDOMEN:abdomen soft, non-tender and normal bowel sounds Musculoskeletal:no cyanosis of digits and no clubbing  PSYCH: alert & oriented x 3 with fluent speech NEURO: no focal motor/sensory deficits  LABORATORY DATA:  I have reviewed the data as listed Lab Results  Component Value Date   WBC 4.8 09/08/2023   HGB  13.4 09/08/2023   HCT 39.6 09/08/2023   MCV 99.0 09/08/2023   PLT 167 09/08/2023   Recent Labs    08/11/23 1325 09/02/23 1041 09/08/23 1403  NA 131* 134* 136  K 4.6 4.0 4.2  CL 98 99 99  CO2 25 27 26   GLUCOSE 108* 123* 92  BUN 15 11 15   CREATININE 1.00 1.18 1.15  CALCIUM 8.7* 9.1 9.3  GFRNONAA >60 >60 >60  PROT 6.6 7.0 8.1  ALBUMIN 3.8 3.3* 3.4*  AST 26 31 42*  ALT 20 18 17   ALKPHOS 70 71 83  BILITOT 0.5 0.6 0.6    RADIOGRAPHIC STUDIES: I have personally reviewed the radiological images as listed and agreed with the findings in the report. No results found.

## 2023-09-09 LAB — KAPPA/LAMBDA LIGHT CHAINS
Kappa free light chain: 1165.5 mg/L — ABNORMAL HIGH (ref 3.3–19.4)
Kappa, lambda light chain ratio: 333 — ABNORMAL HIGH (ref 0.26–1.65)
Lambda free light chains: 3.5 mg/L — ABNORMAL LOW (ref 5.7–26.3)

## 2023-09-10 LAB — MULTIPLE MYELOMA PANEL, SERUM
Albumin SerPl Elph-Mcnc: 3.3 g/dL (ref 2.9–4.4)
Albumin/Glob SerPl: 0.9 (ref 0.7–1.7)
Alpha 1: 0.4 g/dL (ref 0.0–0.4)
Alpha2 Glob SerPl Elph-Mcnc: 0.9 g/dL (ref 0.4–1.0)
B-Globulin SerPl Elph-Mcnc: 0.9 g/dL (ref 0.7–1.3)
Gamma Glob SerPl Elph-Mcnc: 1.7 g/dL (ref 0.4–1.8)
Globulin, Total: 3.9 g/dL (ref 2.2–3.9)
IgA: 1462 mg/dL — ABNORMAL HIGH (ref 61–437)
IgG (Immunoglobin G), Serum: 436 mg/dL — ABNORMAL LOW (ref 603–1613)
IgM (Immunoglobulin M), Srm: 7 mg/dL — ABNORMAL LOW (ref 15–143)
M Protein SerPl Elph-Mcnc: 1.4 g/dL — ABNORMAL HIGH
Total Protein ELP: 7.2 g/dL (ref 6.0–8.5)

## 2023-09-16 ENCOUNTER — Inpatient Hospital Stay: Payer: Medicare Other | Admitting: Internal Medicine

## 2023-09-16 ENCOUNTER — Encounter: Payer: Self-pay | Admitting: Internal Medicine

## 2023-09-16 DIAGNOSIS — C9000 Multiple myeloma not having achieved remission: Secondary | ICD-10-CM

## 2023-09-16 NOTE — Progress Notes (Signed)
 Melvin Gilmore  I connected with Melvin Gilmore on 09/16/23 at  2:45 PM EST by my chart video and verified that I am speaking with the correct person using two identifiers.   I discussed the limitations, risks, security and privacy concerns of performing an evaluation and management service by telemedicine and the availability of in-person appointments. I also discussed with the patient that there may be a patient responsible charge related to this service. The patient expressed understanding and agreed to proceed.   Other persons participating in the visit and their role in the encounter: none   Patient's location: home  Provider's location: clinic   Chief Complaint: discuss treatment options   Patient Care Team: [name], MD as PCP - General (Internal Medicine) [name], MD as Consulting Physician (Oncology)  CANCER STAGING   Cancer Staging  Multiple myeloma [facility]) Staging form: Plasma Cell Myeloma and Plasma Cell Disorders, AJCC 8th Edition - Clinical stage from 12/05/2022: RISS Stage II (Beta-2-microglobulin (mg/L): 3.7, Albumin (g/dL): 3.6, ISS: Stage II, High-risk cytogenetics: Absent, LDH: Normal) - Signed by [name], MD on 12/26/2022 Stage prefix: Initial diagnosis Beta 2 microglobulin range (mg/L): 3.5 to 5.49 Albumin range (g/dL): Greater than or equal to 3.5 Cytogenetics: 1q addition, Other mutation   ASSESSMENT & PLAN:  Melvin Gilmore 83 y.o. male with pmh of hypertension, hyperlipidemia, anxiety, GERD, BPH, hypothyroidism, prostate cancer s/p HIFU was seen by primary on November 13, 2022 for acute onset of lower back pain follows with medical oncology for management of multiple myeloma.  # Multiple myeloma IgA, RISS Stage II, high risk -MRI thoracic and lumbar spine with and without contrast was reviewed. Showed unchanged subacute compression fracture at T12.  Focal enhancing marrow replacing lesions in T6, T8, T5, T2 all  suspicious for metastatic disease.  L1 lesion measuring 16 mm.  Multilevel degenerative changes present.  -SPEP/IFE showed biclonal IgA kappa paraprotein with M spike 2.1 g/dL and second spike at 0.4 g/dL.  Kappa 453, lambda 6.8 with a ratio of 66.7.  Iron panel and B12 are normal.  PSA 3.44.  CBC showed mild anemia 13.4, chronic.  On CMP normal creatinine and calcium levels.  Elevated total protein 8.8.  LDH normal.  Beta-2 microglobulin 3.7.  Albumin 3.6.  24-hour urine/UPEP showed 1.1 g protein with no M spike.  -BMBx Hypercellular marrow with 60% plasma cells.  IHC positive for CD138/ mum 1.  Kappa restricted consistent with multiple myeloma. L1 vertebral body biopsy showed numerous plasma cells. Cytogenetics - del (13q) and gain 1.    -PET/CT showed activity in L1 and left eighth rib concerning for myeloma.  -Started on Dara-RD on 12/26/2022.  Completed cycle 1 of daratumumab ->hospitalized for urinary retention, constipation and back pain -> inpatient hospitalist transition him to hospice -> patient revoked hospice on 01/31/2023 since he was feeling better.  Multiple treatment interruptions due to UTIs and hospitalization for stroke.  -Completed cycle 5-day 1 of daratumumab on 06/09/2023.  Progressed with increase in kappa light chain.  Changed to VRD on 06/20/2023.  With C1D1 of Velcade he had rigors.  Premedicated with Tylenol and Benadryl with C1D8.  4 hours postinfusion had severe rigors, hallucinations, weakness.  Patient never wants to go back on Velcade.  -s/p Ixazomib/Revlimid/Dex from 07/04/23 - 09/15/23.  Labs showed disease progression.  Did require dose reduction with Ixazomib with diarrhea.  -I discussed in detail with the patient and his wife about the next treatment options.  Unfortunately, his IgA multiple myeloma is high risk and has been behaving aggressively.  His functional status is also borderline.  Discussed using more aggressive regimen with weekly IV carfilzomib, pomalidomide  and dexamethasone.  Side effects such as decreased blood count, increased risk of infection, 5% risk of cardiac failure was discussed.  We also discussed lesser intense regimen however with lesser efficacy with IV elotuzumab weekly, pomalidomide and dexamethasone 4-week cycle.  Side effects were discussed.  We also discussed about focusing on comfort measures/symptom management and hospice care.  He saw his primary care yesterday for a rash on right arm and torso concerning for herpes zoster infection.  He started on Valtrex for 10 days on 09/15/2023.  Will wait until he completes treatment for herpes as he is at a higher risk of worsening infection with the myeloma treatment.  Also the patient and his wife need time to think about their options.  I will follow-up again in 1 week via video visit to finalize the treatment plan.  I have advised to hold acyclovir until he finishes Valtrex.  Continue with aspirin 81 mg.  Continue with Dex 2 mg for 4 days weekly 3-week on 1 week off.  Cannot tolerate higher dose due to intractable hiccups.  # Recurrent UTIs -Follows with Dr. Lonna Cobb.  On trimethoprim.    # Stroke - Admitted 03/07/2023 with acute onset aphasia and right hemianopia. s/p tPA with complete resolution of symptoms.  MRI brain negative.  Echo unremarkable.  On aspirin and Plavix for 21 days then aspirin alone.  On Lipitor 40 mg daily.  Less likely to be related to Revlimid since he had already received 1 cycle and treatment was on hold as he was recovering from neutropenia.  # Intractable hiccups -Secondary to dexamethasone.  # Vertebral compression fractures -Dental clearance obtained. - IV Zometa 3 mg (started on 06/09/2023).  Every 3 months.   # T12 pathological fracture # Cancer-related pain -s/p kyphoplasty by Dr. Elijio Miles on 12/06/22.   -Completed palliative RT on 01/20/2023.  -MRI spine performed From 06/27/2023 shows progression of multiple myeloma with innumerable lesions throughout the  covered skeleton since July 2024.  Compression fractures seen on prior have healed.  No acute fracture. Continue with brace PRN.  Can use hemp cream OTC.  Continues to decline oral opioids.  Continue with Tylenol.  # Constipation -Manageable with senna, MiraLAX, lactulose, Movantik as needed.  # History of prostate cancer - follows with Dr. Evelene Croon, urology for elevated PSA.  MRI prostate showed 1 mm category 5 lesion of anterior transition zone.  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 s/p HIFU of the prostate on 02/07/2022.  PSA level from 10/08/2022 was 3.34.  -PSA from 11/21/2022 3.44.  # Anxiety -He is taking Ambien 5 mg nightly prescribed by PCP.  Understands the risk of increased falls/confusion.  t really helps him with sleep and anxiety so he would like to continue it.  No orders of the defined types were placed in this encounter.    The total time spent in the appointment was 30 minutes encounter with patients including review of chart and various tests results, discussions about plan of care and coordination of care plan   All questions were answered. The patient knows to call the clinic with any problems, questions or concerns. No barriers to learning was detected.  [name], MD 2/25/20253:40 PM   HISTORY OF PRESENTING ILLNESS:  Melvin Gilmore 83 y.o. male  with past medical history of hypertension, hyperlipidemia, anxiety, GERD, BPH, hypothyroidism, prostate cancer s/p HIFU was seen by primary on November 13, 2022 for acute onset of lower back pain.  Patient reports that he was sitting outside and had violent sneeze which was followed by severe back pain.  He has fallen allergies.  Had x-rays followed by MRI thoracic spine without contrast.  It showed compression fracture of T12 superior endplate with surrounding marrow edema likely subacute.  15 mm T1 hypointense marrow lesion in the anterior aspect of L1 suspicious for  metastasis.  Further characterization with postcontrast MRI recommended.  Prostate cancer-follows with Dr. Evelene Croon, urology for elevated PSA.  MRI prostate showed 1 mm category 5 lesion of anterior transition zone.  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 s/p HIFU of the prostate on 02/07/2022.  PSA level from 10/08/2022 was 3.34.  Interval history Patient was connected via MyChart video visit accompanied with his wife. 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  He is on Valtrex for 10 days.  Has been having itching.  We discussed about options such as doing Claritin or Benadryl cream on the rash.  His appetite is good.  However he has been feeling weaker.  I have reviewed his chart and materials related to his cancer extensively and collaborated history with the patient. Summary of oncologic history is as follows: Oncology History  Multiple myeloma (HCC)  12/05/2022 Cancer Staging   Staging form: Plasma Cell Myeloma and Plasma Cell Disorders, AJCC 8th Edition - Clinical stage from 12/05/2022: RISS Stage II (Beta-2-microglobulin (mg/L): 3.7, Albumin (g/dL): 3.6, ISS: Stage II, High-risk cytogenetics: Absent, LDH: Normal) - Signed by [name], MD on 12/26/2022 Stage prefix: Initial diagnosis Beta 2 microglobulin range (mg/L): 3.5 to 5.49 Albumin range (g/dL): Greater than or equal to 3.5 Cytogenetics: 1q addition, Other mutation   12/13/2022 Initial Diagnosis   Multiple myeloma (HCC)   12/26/2022 - 01/24/2023 Chemotherapy   Patient is on Treatment Plan : MYELOMA NEWLY DIAGNOSED Daratumumab IV + Lenalidomide + Dexamethasone Weekly (DaraRd) q28d     02/07/2023 - 02/07/2023 Chemotherapy   Patient is on Treatment Plan : MYELOMA NON-TRANSPLANT CANDIDATES VRd weekly q21d     02/13/2023 - 06/09/2023 Chemotherapy   Patient is on Treatment Plan : MYELOMA NEWLY DIAGNOSED Daratumumab IV + Lenalidomide +  Dexamethasone Weekly (DaraRd) q28d     06/13/2023 - 06/23/2023 Chemotherapy   Patient is on Treatment Plan : MYELOMA  RVD SQ q21d x 4 cycles       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   Nausea vomiting and diarrhea 01/26/2023   Prostate cancer (HCC) 2023   Seasonal allergies    Skin cancer    a.) ears   Wears partial dentures    lower    SURGICAL HISTORY: Past Surgical History:  Procedure Laterality Date   APPENDECTOMY  CATARACT EXTRACTION W/PHACO Right 01/15/2016   Procedure: CATARACT EXTRACTION PHACO AND INTRAOCULAR LENS PLACEMENT (IOC);  Surgeon: Sherald Hess, MD;  Location: [facility];  Service: Ophthalmology;  Laterality: Right;  RIGHT CALL CELL PHONE WITH TIME   CHOLECYSTECTOMY     COLONOSCOPY     COLONOSCOPY WITH ESOPHAGOGASTRODUODENOSCOPY (EGD)     ESOPHAGOGASTRODUODENOSCOPY  07/2021   ESOPHAGOGASTRODUODENOSCOPY (EGD) WITH PROPOFOL N/A 09/23/2017   Procedure: ESOPHAGOGASTRODUODENOSCOPY (EGD) WITH PROPOFOL;  Surgeon: Toledo, Boykin Nearing, MD;  Location: [facility];  Service: Gastroenterology;  Laterality: N/A;   HIGH INTENSITY FOCUSED ULTRASOUND (HIFU) OF THE PROSTATE N/A 02/07/2022   Procedure: HIGH INTENSITY FOCUSED ULTRASOUND (HIFU) OF THE PROSTATE;  Surgeon: Orson Ape, MD;  Location: [facility];  Service: Urology;  Laterality: N/A;   HYDROCELE EXCISION / REPAIR     IR KYPHO THORACIC WITH BONE BIOPSY  12/06/2022   IR RADIOLOGIST EVAL & MGMT  11/21/2022   LASIX RT EYE     PROSTATE BIOPSY  N/A 01/03/2022   Procedure: PROSTATE BIOPSY  Addison Arista;  Surgeon: Orson Ape, MD;  Location: [facility];  Service: Urology;  Laterality: N/A;   ROTATOR CUFF REPAIR Left 2001    SOCIAL HISTORY: Social History   Socioeconomic History   Marital status: Married    Spouse name: Not on file   Number of children: Not on file   Years of education: Not on file   Highest education level: Not on file  Occupational History   Not on file  Tobacco Use   Smoking status: Never   Smokeless tobacco: Never  Vaping Use   Vaping status: Never Used  Substance and Sexual Activity   Alcohol use: Yes    Alcohol/week: 7.0 standard drinks of alcohol    Types: 7 Glasses of wine per week    Comment: wine occ   Drug use: No   Sexual activity: Not on file  Other Topics Concern   Not on file  Social History Narrative   Not on file   Social Drivers of Health   Financial Resource Strain: Low Risk  (06/11/2023)   Received from [facility]   Overall Financial Resource Strain (CARDIA)    Difficulty of Paying Living Expenses: Not hard at all  Food Insecurity: No Food Insecurity (06/11/2023)   Received from [facility]   Hunger Vital Sign    Worried About Running Out of Food in the Last Year: Never true    Ran Out of Food in the Last Year: Never true  Transportation Needs: No Transportation Needs (06/11/2023)   Received from [facility]    In the past 12 months, has lack of transportation kept you from medical appointments or from getting medications?: No    Lack of Transportation (Non-Medical): No  Physical Activity: Not on file  Stress: Not on file  Social Connections: Not on file  Intimate Partner Violence: Patient Unable To Answer (03/07/2023)   Humiliation, Afraid, Rape, and Kick questionnaire    Fear of Current or Ex-Partner: Patient unable to answer    Emotionally Abused: Patient unable to answer    Physically  Abused: Patient unable to answer    Sexually Abused: Patient unable to answer    FAMILY HISTORY: Family History  Problem Relation Age of Onset   Pneumonia Father     ALLERGIES:  is allergic to ciprofloxacin, amoxicillin-pot clavulanate, meloxicam, and trimethoprim.  MEDICATIONS:  Current Outpatient Medications  Medication Sig Dispense Refill   acetaminophen (TYLENOL) 500 MG tablet Take 500 mg by mouth every 6 (six) hours as needed for mild pain, moderate pain, fever or headache.     acyclovir (ZOVIRAX) 400 MG tablet Take 400 mg by mouth 2 (two) times daily.     ALPRAZolam (XANAX) 0.25 MG tablet Take 1 tablet (0.25 mg total) by mouth at bedtime as needed for anxiety. 30 tablet 0   levothyroxine (SYNTHROID, LEVOTHROID) 50 MCG tablet Take 50 mcg by mouth daily before breakfast.     metoCLOPramide (REGLAN) 10 MG tablet Take 1 tablet (10 mg total) by mouth every 8 (eight) hours as needed for up to 10 doses (hiccups). Take one pill 1 hour prior to taking Decadron. 10 tablet 0   pantoprazole (PROTONIX) 40 MG tablet Take 40 mg by mouth daily as needed.     predniSONE (DELTASONE) 20 MG tablet Take by mouth.     Probiotic Product (PROBIOTIC ADVANCED PO) Take 1 tablet by mouth at bedtime.     senna (SENOKOT) 8.6 MG tablet Take 2 tablets (17.2 mg total) by mouth 2 (two) times daily. May crush, mix with water and give sublingually if needed. 28 tablet 0   sulfamethoxazole-trimethoprim (BACTRIM DS) 800-160 MG tablet Take 1 tablet by mouth every 12 (twelve) hours. 60 tablet 0   traZODone (DESYREL) 50 MG tablet Take by mouth.     trimethoprim (TRIMPEX) 100 MG tablet Take 1 tablet (100 mg total) by mouth daily. 90 tablet 0   valACYclovir (VALTREX) 1000 MG tablet Take by mouth.     zolpidem (AMBIEN) 5 MG tablet TAKE 1 TABLET BY MOUTH AT BEDTIME AS NEEDED FOR SLEEP. 30 tablet 0   atorvastatin (LIPITOR) 40 MG tablet Take 1 tablet (40 mg total) by mouth daily. (Patient not taking: Reported on 09/16/2023) 30  tablet 2   diphenoxylate-atropine (LOMOTIL) 2.5-0.025 MG tablet Take 1 tablet by mouth 4 (four) times daily as needed for diarrhea or loose stools. (Patient not taking: Reported on 09/16/2023) 30 tablet 0   hydrOXYzine (ATARAX) 25 MG tablet Take 1 tablet by mouth at bedtime. (Patient not taking: Reported on 09/16/2023)     ixazomib citrate (NINLARO) 3 MG capsule Take 1 capsule (3 mg) by mouth weekly, 3 weeks on, 1 week off, repeat every 4 weeks. Take on an empty stomach 1hr before or 2hr after meals. (Patient not taking: Reported on 09/16/2023) 3 capsule 0   lenalidomide (REVLIMID) 5 MG capsule Take 1 capsule (5 mg total) by mouth daily. Take for 21 days, then hold for 7 days. Repeat every 28 days. (Patient not taking: Reported on 09/16/2023) 21 capsule 0   pravastatin (PRAVACHOL) 10 MG tablet Take 1 tablet by mouth at bedtime. (Patient not taking: Reported on 09/16/2023)     No current facility-administered medications for this visit.    REVIEW OF SYSTEMS:   Pertinent information mentioned in HPI All other systems were reviewed with the patient and are negative.  PHYSICAL EXAMINATION: ECOG PERFORMANCE STATUS: 1 - Symptomatic but completely ambulatory  There were no vitals filed for this visit.     There were no vitals filed for this visit.      GENERAL:alert, no distress and comfortable SKIN: skin color, texture, turgor are normal, no rashes or significant lesions EYES: normal, conjunctiva are pink and non-injected, sclera clear OROPHARYNX:no exudate, no erythema and lips, buccal mucosa, and tongue normal  NECK: supple, thyroid normal size, non-tender, without nodularity LYMPH:  no palpable lymphadenopathy  in the cervical, axillary or inguinal LUNGS: clear to auscultation and percussion with normal breathing effort HEART: regular rate & rhythm and no murmurs and no lower extremity edema ABDOMEN:abdomen soft, non-tender and normal bowel sounds Musculoskeletal:no cyanosis of digits and  no clubbing  PSYCH: alert & oriented x 3 with fluent speech NEURO: no focal motor/sensory deficits  LABORATORY DATA:  I have reviewed the data as listed Lab Results  Component Value Date   WBC 4.8 09/08/2023   HGB 13.4 09/08/2023   HCT 39.6 09/08/2023   MCV 99.0 09/08/2023   PLT 167 09/08/2023   Recent Labs    08/11/23 1325 09/02/23 1041 09/08/23 1403  NA 131* 134* 136  K 4.6 4.0 4.2  CL 98 99 99  CO2 25 27 26   GLUCOSE 108* 123* 92  BUN 15 11 15   CREATININE 1.00 1.18 1.15  CALCIUM 8.7* 9.1 9.3  GFRNONAA >60 >60 >60  PROT 6.6 7.0 8.1  ALBUMIN 3.8 3.3* 3.4*  AST 26 31 42*  ALT 20 18 17   ALKPHOS 70 71 83  BILITOT 0.5 0.6 0.6    RADIOGRAPHIC STUDIES: I have personally reviewed the radiological images as listed and agreed with the findings in the report. No results found.

## 2023-09-16 NOTE — Patient Instructions (Signed)
 Today, we discussed about changing treatment regimen to IV carfilzomib weekly, oral pomalidomide along with low-dose of dexamethasone which she is being on.  Carfilzomib is a more aggressive IV medication.  It can increase the risk of heart failure in 5% of the cases.  It can also cause decrease in the blood counts and increased risk of infection.  Pomalidomide is similar to Revlimid that he is on right now.  The second regimen that we discussed today was with IV elotuzumab along with pomalidomide and dexamethasone.  Elotuzumab is less aggressive IV medication which is done weekly on every 4-week cycle.  It can decrease your blood counts, increased risk of infection, increased blood pressure.  We also discussed about focusing on symptom management/comfort care/hospice.  Hold acyclovir until he completes Valtrex.  We will follow-up next Monday to further discuss the plan.

## 2023-09-18 ENCOUNTER — Other Ambulatory Visit: Payer: Self-pay

## 2023-09-18 ENCOUNTER — Telehealth: Payer: Self-pay | Admitting: *Deleted

## 2023-09-18 ENCOUNTER — Other Ambulatory Visit: Payer: Self-pay | Admitting: Internal Medicine

## 2023-09-18 ENCOUNTER — Telehealth: Payer: Self-pay

## 2023-09-18 MED ORDER — MORPHINE SULFATE 15 MG PO TABS: 15.0000 mg | ORAL_TABLET | Freq: Three times a day (TID) | ORAL | 0 refills | Status: DC | PRN

## 2023-09-18 NOTE — Telephone Encounter (Signed)
 Patient bent over and lost his balance and landed on his bottom on 09/14/2023. Since then he has been having severe lower back pain. I called and spoke with patients wife, she asked me what should their next step be, and I told her to reach out to his PCP Dr. Judithann Sheen, and see if he may need an x-ray. Dr. Alena Bills was in a room with a patient while I was on the phone so I couldn't ask her, so I told [name] that I would send her a MyChart message on what Dr. Alena Bills recommends that they do next.

## 2023-09-18 NOTE — Telephone Encounter (Signed)
 Wife says that the pt. Fell 4 days ago and he is having pain in the lower part of his back. He says pain is across the lower back. Wife wanted to know if he needs to come in or what to do. He still has the pain today also.

## 2023-09-18 NOTE — Telephone Encounter (Signed)
 Called and spoke with patient, which I ended up just speaking to patient's wife Melvin Gilmore, in regards to patient bending over and losing his balance and fell on his bottom on 09/14/2023. Since then he has been having severe lower back pain. So she reached out to [place] to see what we could do to help. After discussing the situation with Dr. Alena Bills, I called patient back and relayed what Dr. Alan Ripper plan was. Dr. Mervyn Skeeters sent over a prescription of Morphine 15 mg take every 8 hours as needed, and come Monday 09/22/2023 when they attend their scheduled MyChart visit with Dr. Mervyn Skeeters, she is going to see if his pain has either got better or worse, and if worse she is probably going to send him to go and have a MRI. Patient and his wife Melvin Gilmore understood and agreed with this plan.

## 2023-09-18 NOTE — Progress Notes (Signed)
 Called and spoke with patient's wife, [name] regarding Ninlaro. Prescriber has instructed patient to stop Ninlaro. They have an appointment on Monday, 09/22/23 to discuss other therapies. Per wife, patient maybe going back to infusion. Pharmacy to follow up after appointment on 09/22/23.

## 2023-09-22 ENCOUNTER — Ambulatory Visit
Admission: RE | Admit: 2023-09-22 | Discharge: 2023-09-22 | Disposition: A | Discharge status: Home / Self Care | Source: Ambulatory Visit | Attending: Internal Medicine | Admitting: Internal Medicine

## 2023-09-22 ENCOUNTER — Encounter: Payer: Self-pay | Admitting: Internal Medicine

## 2023-09-22 ENCOUNTER — Inpatient Hospital Stay: Payer: Medicare Other | Attending: Internal Medicine | Admitting: Internal Medicine

## 2023-09-22 ENCOUNTER — Other Ambulatory Visit (HOSPITAL_COMMUNITY): Payer: Self-pay

## 2023-09-22 DIAGNOSIS — C9000 Multiple myeloma not having achieved remission: Secondary | ICD-10-CM | POA: Insufficient documentation

## 2023-09-22 DIAGNOSIS — K5900 Constipation, unspecified: Secondary | ICD-10-CM | POA: Insufficient documentation

## 2023-09-22 DIAGNOSIS — C61 Malignant neoplasm of prostate: Secondary | ICD-10-CM | POA: Insufficient documentation

## 2023-09-22 DIAGNOSIS — M4854XA Collapsed vertebra, not elsewhere classified, thoracic region, initial encounter for fracture: Secondary | ICD-10-CM | POA: Insufficient documentation

## 2023-09-22 DIAGNOSIS — Z87440 Personal history of urinary (tract) infections: Secondary | ICD-10-CM | POA: Insufficient documentation

## 2023-09-22 DIAGNOSIS — Z8673 Personal history of transient ischemic attack (TIA), and cerebral infarction without residual deficits: Secondary | ICD-10-CM | POA: Insufficient documentation

## 2023-09-22 DIAGNOSIS — F419 Anxiety disorder, unspecified: Secondary | ICD-10-CM | POA: Insufficient documentation

## 2023-09-22 DIAGNOSIS — R066 Hiccough: Secondary | ICD-10-CM | POA: Insufficient documentation

## 2023-09-22 MED ORDER — GADOBUTROL 1 MMOL/ML IV SOLN
7.0000 mL | Freq: Once | INTRAVENOUS | Status: AC | PRN
  Administered 2023-09-22: 7 mL via INTRAVENOUS

## 2023-09-22 NOTE — Progress Notes (Signed)
 Called patient and spoke with his wife [name], about his MyChart visit today at 3:30 pm, and she told me that his pain has got worse, she is giving him his medication. She is wanting him to go ahead and have the MRI done. She said that he isn't eating as much.

## 2023-09-24 ENCOUNTER — Telehealth: Payer: Self-pay | Admitting: *Deleted

## 2023-09-24 NOTE — Telephone Encounter (Signed)
 Wife says that she sees the pt. Declining and wants to see Josh on Thursday or Friday. I called the wife and let herknow that on Friday with labs but also seeing Dr. Alena Bills. Wife states that she did not know that and we will keep that appt and see Agrawal.

## 2023-09-26 ENCOUNTER — Inpatient Hospital Stay

## 2023-09-26 ENCOUNTER — Inpatient Hospital Stay: Admitting: Hospice and Palliative Medicine

## 2023-09-26 ENCOUNTER — Inpatient Hospital Stay: Admitting: Internal Medicine

## 2023-09-26 DIAGNOSIS — Z515 Encounter for palliative care: Secondary | ICD-10-CM

## 2023-09-26 DIAGNOSIS — C9000 Multiple myeloma not having achieved remission: Secondary | ICD-10-CM

## 2023-09-26 DIAGNOSIS — K5900 Constipation, unspecified: Secondary | ICD-10-CM | POA: Diagnosis not present

## 2023-09-26 DIAGNOSIS — Z8673 Personal history of transient ischemic attack (TIA), and cerebral infarction without residual deficits: Secondary | ICD-10-CM | POA: Diagnosis not present

## 2023-09-26 DIAGNOSIS — R066 Hiccough: Secondary | ICD-10-CM | POA: Diagnosis not present

## 2023-09-26 DIAGNOSIS — C61 Malignant neoplasm of prostate: Secondary | ICD-10-CM | POA: Diagnosis not present

## 2023-09-26 DIAGNOSIS — Z87440 Personal history of urinary (tract) infections: Secondary | ICD-10-CM | POA: Diagnosis not present

## 2023-09-26 DIAGNOSIS — M4854XA Collapsed vertebra, not elsewhere classified, thoracic region, initial encounter for fracture: Secondary | ICD-10-CM | POA: Diagnosis not present

## 2023-09-26 DIAGNOSIS — F419 Anxiety disorder, unspecified: Secondary | ICD-10-CM | POA: Diagnosis not present

## 2023-09-26 LAB — CBC WITH DIFFERENTIAL/PLATELET
Abs Immature Granulocytes: 0.03 10*3/uL (ref 0.00–0.07)
Basophils Absolute: 0 10*3/uL (ref 0.0–0.1)
Basophils Relative: 1 %
Eosinophils Absolute: 0.1 10*3/uL (ref 0.0–0.5)
Eosinophils Relative: 1 %
HCT: 37.7 % — ABNORMAL LOW (ref 39.0–52.0)
Hemoglobin: 12.6 g/dL — ABNORMAL LOW (ref 13.0–17.0)
Immature Granulocytes: 1 %
Lymphocytes Relative: 21 %
Lymphs Abs: 0.9 10*3/uL (ref 0.7–4.0)
MCH: 32.7 pg (ref 26.0–34.0)
MCHC: 33.4 g/dL (ref 30.0–36.0)
MCV: 97.9 fL (ref 80.0–100.0)
Monocytes Absolute: 0.8 10*3/uL (ref 0.1–1.0)
Monocytes Relative: 19 %
Neutro Abs: 2.4 10*3/uL (ref 1.7–7.7)
Neutrophils Relative %: 57 %
Platelets: 108 10*3/uL — ABNORMAL LOW (ref 150–400)
RBC: 3.85 MIL/uL — ABNORMAL LOW (ref 4.22–5.81)
RDW: 15.2 % (ref 11.5–15.5)
Smear Review: NORMAL
WBC: 4.2 10*3/uL (ref 4.0–10.5)
nRBC: 0 % (ref 0.0–0.2)

## 2023-09-26 LAB — COMPREHENSIVE METABOLIC PANEL
ALT: 14 U/L (ref 0–44)
AST: 43 U/L — ABNORMAL HIGH (ref 15–41)
Albumin: 3 g/dL — ABNORMAL LOW (ref 3.5–5.0)
Alkaline Phosphatase: 70 U/L (ref 38–126)
Anion gap: 12 (ref 5–15)
BUN: 15 mg/dL (ref 8–23)
CO2: 23 mmol/L (ref 22–32)
Calcium: 9.4 mg/dL (ref 8.9–10.3)
Chloride: 98 mmol/L (ref 98–111)
Creatinine, Ser: 1.58 mg/dL — ABNORMAL HIGH (ref 0.61–1.24)
GFR, Estimated: 43 mL/min — ABNORMAL LOW (ref >60)
Glucose, Bld: 128 mg/dL — ABNORMAL HIGH (ref 70–99)
Potassium: 4 mmol/L (ref 3.5–5.1)
Sodium: 133 mmol/L — ABNORMAL LOW (ref 135–145)
Total Bilirubin: 0.8 mg/dL (ref 0.0–1.2)
Total Protein: 9.4 g/dL — ABNORMAL HIGH (ref 6.5–8.1)

## 2023-09-26 MED ORDER — ZOLPIDEM TARTRATE 5 MG PO TABS
2.5000 mg | ORAL_TABLET | Freq: Every evening | ORAL | 0 refills | Status: DC | PRN
Start: 2023-09-26 — End: 2023-10-10

## 2023-09-26 NOTE — Progress Notes (Signed)
 Patient has that rash still, it isn't getting any better, and his appetite has went away. He has lost more than a couple of lbs since last visit. They would like to speak with Josh Borders again. He has no energy, and he is sleeping more during the day.  He had an MRI on 09/22/2023.

## 2023-09-26 NOTE — Progress Notes (Signed)
 [name] CONSULT NOTE  Patient Care Team: Melvin Arbour, MD as PCP - General (Internal Medicine) Melvin Barter, MD as Consulting Physician (Oncology)  CANCER STAGING   Cancer Staging  Multiple myeloma [facility]) Staging form: Plasma Cell Myeloma and Plasma Cell Disorders, AJCC 8th Edition - Clinical stage from 12/05/2022: RISS Stage II (Beta-2-microglobulin (mg/L): 3.7, Albumin (g/dL): 3.6, ISS: Stage II, High-risk cytogenetics: Absent, LDH: Normal) - Signed by Melvin Barter, MD on 12/26/2022 Stage prefix: Initial diagnosis Beta 2 microglobulin range (mg/L): 3.5 to 5.49 Albumin range (g/dL): Greater than or equal to 3.5 Cytogenetics: 1q addition, Other mutation   ASSESSMENT & PLAN:  Melvin Gilmore 83 y.o. male with pmh of hypertension, hyperlipidemia, anxiety, GERD, BPH, hypothyroidism, prostate cancer s/p HIFU was seen by primary on November 13, 2022 for acute onset of lower back pain follows with medical oncology for management of multiple myeloma.  # Multiple myeloma IgA, RISS Stage II, high risk -MRI thoracic and lumbar spine with and without contrast was reviewed. Showed unchanged subacute compression fracture at T12.  Focal enhancing marrow replacing lesions in T6, T8, T5, T2 all suspicious for metastatic disease.  L1 lesion measuring 16 mm.  Multilevel degenerative changes present.  -SPEP/IFE showed biclonal IgA kappa paraprotein with M spike 2.1 g/dL and second spike at 0.4 g/dL.  Kappa 453, lambda 6.8 with a ratio of 66.7.  Iron panel and B12 are normal.  PSA 3.44.  CBC showed mild anemia 13.4, chronic.  On CMP normal creatinine and calcium levels.  Elevated total protein 8.8.  LDH normal.  Beta-2 microglobulin 3.7.  Albumin 3.6.  24-hour urine/UPEP showed 1.1 g protein with no M spike.  -BMBx Hypercellular marrow with 60% plasma cells.  IHC positive for CD138/ mum 1.  Kappa restricted consistent with multiple myeloma. L1 vertebral body biopsy showed numerous plasma  cells. Cytogenetics - del (13q) and gain 1.    -PET/CT showed activity in L1 and left eighth rib concerning for myeloma.  -Started on Dara-RD on 12/26/2022.  Completed cycle 1 of daratumumab ->hospitalized for urinary retention, constipation and back pain -> inpatient hospitalist transition him to hospice -> patient revoked hospice on 01/31/2023 since he was feeling better.  Multiple treatment interruptions due to UTIs and hospitalization for stroke.  -Completed cycle 5-day 1 of daratumumab on 06/09/2023.  Progressed with increase in kappa light chain.  Changed to VRD on 06/20/2023.  With C1D1 of Velcade he had rigors.  Premedicated with Tylenol and Benadryl with C1D8.  4 hours postinfusion had severe rigors, hallucinations, weakness.  Patient never wants to go back on Velcade.  -s/p Ixazomib/Revlimid/Dex due to frailty from 07/04/23 - 09/15/23.  Labs showed disease progression. Required dose reduction with Ixazomib with diarrhea.  -Had extensive discussion with the patient and his wife about progression of multiple myeloma. 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  Also he had a fall last week.  We did MRI of thoracic and lumbar spine with no acute fractures.  His pain in the lower back is improving.  His malignancy is behaving aggressively.  He has developed AKI likely from myeloma protein accumulation.  Patient has been declining in regards to his functional status.  His appetite has been poor feeling weak and spending more time sleeping.  Wife shares that he has expressed on multiple occasions that he is suffering and is overall tired from the treatment.  I discussed about options of doing IV carfilzomib based regimen which  will require coming to the clinic on weekly basis for IV treatment and blood work.  Side effects such as decreased blood counts, increased risk of infection, herpes reactivation, 5% risk of ventricular dysfunction, fatigue, nausea, changes in bowel movement.   Versus focusing more on comfort measures and quality of life.  I requested Melvin Gilmore from palliative care to also talk to the patient and wife.  They have decided to proceed with hospice.   RTC as needed.  # Recurrent UTIs -Follows with Melvin Gilmore.  On trimethoprim.    # Stroke - Admitted 03/07/2023 with acute onset aphasia and right hemianopia. s/p tPA with complete resolution of symptoms.  MRI brain negative.  Echo unremarkable.  On aspirin and Plavix for 21 days then aspirin alone.  On Lipitor 40 mg daily.  Less likely to be related to Revlimid since he had already received 1 cycle and treatment was on hold as he was recovering from neutropenia.  # Intractable hiccups -Secondary to dexamethasone.  # Vertebral compression fractures -Dental clearance obtained. - IV Zometa 3 mg (started on 06/09/2023).  Every 3 months.   # T12 pathological fracture # Cancer-related pain -s/p kyphoplasty by Dr. Elijio Gilmore on 12/06/22.   -Completed palliative RT on 01/20/2023.  -MRI spine performed From 06/27/2023 shows progression of multiple myeloma with innumerable lesions throughout the covered skeleton since July 2024.  Compression fractures seen on prior have healed.  No acute fracture. Continue with brace PRN.  Can use hemp cream OTC.  Continues to decline oral opioids.  Continue with Tylenol.  # Constipation -Manageable with senna, MiraLAX, lactulose, Movantik as needed.  # History of prostate cancer - follows with Dr. Evelene Gilmore, urology for elevated PSA.  MRI prostate showed 1 mm category 5 lesion of anterior transition zone.  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 s/p HIFU of the prostate on 02/07/2022.  PSA level from 10/08/2022 was 3.34.  -PSA from 11/21/2022 3.44.  # Anxiety -He is taking Ambien 5 mg nightly prescribed by PCP.  Understands the risk of increased falls/confusion.  t really helps him with sleep and anxiety so he would like to  continue it.  No orders of the defined types were placed in this encounter.   The total time spent in the appointment was 30 minutes encounter with patients including review of chart and various tests results, discussions about plan of care and coordination of care plan   All questions were answered. The patient knows to call the clinic with any problems, questions or concerns. No barriers to learning was detected.  Melvin Barter, MD 3/7/20252:47 PM   HISTORY OF PRESENTING ILLNESS:  Melvin Gilmore 83 y.o. male with past medical history of hypertension, hyperlipidemia, anxiety, GERD, BPH, hypothyroidism, prostate cancer s/p HIFU was seen by primary on November 13, 2022 for acute onset of lower back pain.  Patient reports that he was sitting outside and had violent sneeze which was followed by severe back pain.  He has fallen allergies.  Had x-rays followed by MRI thoracic spine without contrast.  It showed compression fracture of T12 superior endplate with surrounding marrow edema likely subacute.  15 mm T1 hypointense marrow lesion in the anterior aspect of L1 suspicious for metastasis.  Further characterization with postcontrast MRI recommended.  Prostate cancer-follows with Dr. Evelene Gilmore, urology for elevated PSA.  MRI prostate showed 1 mm category 5 lesion of anterior transition zone.  Underwent fusion biopsy showed Gleason score 3+4 adenocarcinoma involving  left anterior 4/4 cores. s/p HIFU of the prostate on 02/07/2022.  PSA level from 10/08/2022 was 3.34.  Interval history Patient was seen today as follow-up accompanied with wife. Patient has been declining quickly.  His appetite has significantly gone down.  He has been feeling more tired and spending more time sleeping.  I have reviewed his chart and materials related to his cancer extensively and collaborated history with the patient. Summary of oncologic history is as follows: Oncology History  Multiple myeloma (HCC)  12/05/2022 Cancer  Staging   Staging form: Plasma Cell Myeloma and Plasma Cell Disorders, AJCC 8th Edition - Clinical stage from 12/05/2022: RISS Stage II (Beta-2-microglobulin (mg/L): 3.7, Albumin (g/dL): 3.6, ISS: Stage II, High-risk cytogenetics: Absent, LDH: Normal) - Signed by Melvin Barter, MD on 12/26/2022 Stage prefix: Initial diagnosis Beta 2 microglobulin range (mg/L): 3.5 to 5.49 Albumin range (g/dL): Greater than or equal to 3.5 Cytogenetics: 1q addition, Other mutation   12/13/2022 Initial Diagnosis   Multiple myeloma (HCC)   12/26/2022 - 01/24/2023 Chemotherapy   Patient is on Treatment Plan : MYELOMA NEWLY DIAGNOSED Daratumumab IV + Lenalidomide + Dexamethasone Weekly (DaraRd) q28d     02/07/2023 - 02/07/2023 Chemotherapy   Patient is on Treatment Plan : MYELOMA NON-TRANSPLANT CANDIDATES VRd weekly q21d     02/13/2023 - 06/09/2023 Chemotherapy   Patient is on Treatment Plan : MYELOMA NEWLY DIAGNOSED Daratumumab IV + Lenalidomide + Dexamethasone Weekly (DaraRd) q28d     06/13/2023 - 06/23/2023 Chemotherapy   Patient is on Treatment Plan : MYELOMA  RVD SQ q21d x 4 cycles       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   Nausea vomiting and diarrhea 01/26/2023   Prostate cancer (HCC) 2023   Seasonal allergies    Skin cancer    a.) ears   Wears partial dentures    lower    SURGICAL HISTORY: Past Surgical History:  Procedure Laterality Date   APPENDECTOMY     CATARACT EXTRACTION W/PHACO Right 01/15/2016   Procedure: CATARACT EXTRACTION PHACO  AND INTRAOCULAR LENS PLACEMENT (IOC);  Surgeon: Sherald Hess, MD;  Location: [facility];  Service: Ophthalmology;  Laterality: Right;  RIGHT CALL CELL PHONE WITH TIME   CHOLECYSTECTOMY     COLONOSCOPY     COLONOSCOPY WITH ESOPHAGOGASTRODUODENOSCOPY (EGD)     ESOPHAGOGASTRODUODENOSCOPY  07/2021   ESOPHAGOGASTRODUODENOSCOPY (EGD) WITH PROPOFOL N/A 09/23/2017   Procedure: ESOPHAGOGASTRODUODENOSCOPY (EGD) WITH PROPOFOL;  Surgeon: Toledo, Boykin Nearing, MD;  Location: [facility];  Service: Gastroenterology;  Laterality: N/A;   HIGH INTENSITY FOCUSED ULTRASOUND (HIFU) OF THE PROSTATE N/A 02/07/2022   Procedure: HIGH INTENSITY FOCUSED ULTRASOUND (HIFU) OF THE PROSTATE;  Surgeon: Orson Ape, MD;  Location: [facility];  Service: Urology;  Laterality: N/A;   HYDROCELE EXCISION / REPAIR     IR KYPHO THORACIC WITH BONE BIOPSY  12/06/2022   IR RADIOLOGIST EVAL & MGMT  11/21/2022   LASIX RT  EYE     PROSTATE BIOPSY N/A 01/03/2022   Procedure: PROSTATE BIOPSY  Addison Burgin;  Surgeon: Orson Ape, MD;  Location: [facility];  Service: Urology;  Laterality: N/A;   ROTATOR CUFF REPAIR Left 2001    SOCIAL HISTORY: Social History   Socioeconomic History   Marital status: Married    Spouse name: Not on file   Number of children: Not on file   Years of education: Not on file   Highest education level: Not on file  Occupational History   Not on file  Tobacco Use   Smoking status: Never   Smokeless tobacco: Never  Vaping Use   Vaping status: Never Used  Substance and Sexual Activity   Alcohol use: Yes    Alcohol/week: 7.0 standard drinks of alcohol    Types: 7 Glasses of wine per week    Comment: wine occ   Drug use: No   Sexual activity: Not on file  Other Topics Concern   Not on file  Social History Narrative   Not on file   Social Drivers of Health   Financial Resource Strain: Low Risk  (06/11/2023)   Received from [facility]   Overall Financial  Resource Strain (CARDIA)    Difficulty of Paying Living Expenses: Not hard at all  Food Insecurity: No Food Insecurity (06/11/2023)   Received from [facility]   Hunger Vital Sign    Worried About Running Out of Food in the Last Year: Never true    Ran Out of Food in the Last Year: Never true  Transportation Needs: No Transportation Needs (06/11/2023)   Received from [facility] - Transportation    In the past 12 months, has lack of transportation kept you from medical appointments or from getting medications?: No    Lack of Transportation (Non-Medical): No  Physical Activity: Not on file  Stress: Not on file  Social Connections: Not on file  Intimate Partner Violence: Patient Unable To Answer (03/07/2023)   Humiliation, Afraid, Rape, and Kick questionnaire    Fear of Current or Ex-Partner: Patient unable to answer    Emotionally Abused: Patient unable to answer    Physically Abused: Patient unable to answer    Sexually Abused: Patient unable to answer    FAMILY HISTORY: Family History  Problem Relation Age of Onset   Pneumonia Father     ALLERGIES:  is allergic to ciprofloxacin, amoxicillin-pot clavulanate, meloxicam, and trimethoprim.  MEDICATIONS:  Current Outpatient Medications  Medication Sig Dispense Refill   acetaminophen (TYLENOL) 500 MG tablet Take 500 mg by mouth every 6 (six) hours as needed for mild pain, moderate pain, fever or headache.     acyclovir (ZOVIRAX) 400 MG tablet Take 400 mg by mouth 2 (two) times daily.     ALPRAZolam (XANAX) 0.25 MG tablet Take 1 tablet (0.25 mg total) by mouth at bedtime as needed for anxiety. 30 tablet 0   atorvastatin (LIPITOR) 40 MG tablet Take 1 tablet (40 mg total) by mouth daily. (Patient not taking: Reported on 09/16/2023) 30 tablet 2   diphenoxylate-atropine (LOMOTIL) 2.5-0.025 MG tablet Take 1 tablet by mouth 4 (four) times daily as needed for diarrhea or loose stools. (Patient not  taking: Reported on 09/16/2023) 30 tablet 0   hydrOXYzine (ATARAX) 25 MG tablet Take 1 tablet by mouth at bedtime. (Patient not taking: Reported on 09/16/2023)     ixazomib citrate (NINLARO) 3 MG capsule Take 1 capsule (  3 mg) by mouth weekly, 3 weeks on, 1 week off, repeat every 4 weeks. Take on an empty stomach 1hr before or 2hr after meals. (Patient not taking: Reported on 09/16/2023) 3 capsule 0   lenalidomide (REVLIMID) 5 MG capsule Take 1 capsule (5 mg total) by mouth daily. Take for 21 days, then hold for 7 days. Repeat every 28 days. (Patient not taking: Reported on 09/16/2023) 21 capsule 0   levothyroxine (SYNTHROID, LEVOTHROID) 50 MCG tablet Take 50 mcg by mouth daily before breakfast.     metoCLOPramide (REGLAN) 10 MG tablet Take 1 tablet (10 mg total) by mouth every 8 (eight) hours as needed for up to 10 doses (hiccups). Take one pill 1 hour prior to taking Decadron. 10 tablet 0   morphine (MSIR) 15 MG tablet Take 1 tablet (15 mg total) by mouth every 8 (eight) hours as needed for severe pain (pain score 7-10). 30 tablet 0   pantoprazole (PROTONIX) 40 MG tablet Take 40 mg by mouth daily as needed.     pravastatin (PRAVACHOL) 10 MG tablet Take 1 tablet by mouth at bedtime. (Patient not taking: Reported on 09/16/2023)     Probiotic Product (PROBIOTIC ADVANCED PO) Take 1 tablet by mouth at bedtime.     senna (SENOKOT) 8.6 MG tablet Take 2 tablets (17.2 mg total) by mouth 2 (two) times daily. May crush, mix with water and give sublingually if needed. 28 tablet 0   sulfamethoxazole-trimethoprim (BACTRIM DS) 800-160 MG tablet Take 1 tablet by mouth every 12 (twelve) hours. 60 tablet 0   traZODone (DESYREL) 50 MG tablet Take by mouth.     trimethoprim (TRIMPEX) 100 MG tablet Take 1 tablet (100 mg total) by mouth daily. 90 tablet 0   zolpidem (AMBIEN) 5 MG tablet Take 0.5-1 tablets (2.5-5 mg total) by mouth at bedtime as needed. for sleep 30 tablet 0   No current facility-administered medications for  this visit.    REVIEW OF SYSTEMS:   Pertinent information mentioned in HPI All other systems were reviewed with the patient and are negative.  PHYSICAL EXAMINATION: ECOG PERFORMANCE STATUS: 1 - Symptomatic but completely ambulatory  There were no vitals filed for this visit.     There were no vitals filed for this visit.      GENERAL:alert, no distress and comfortable SKIN: skin color, texture, turgor are normal, no rashes or significant lesions EYES: normal, conjunctiva are pink and non-injected, sclera clear OROPHARYNX:no exudate, no erythema and lips, buccal mucosa, and tongue normal  NECK: supple, thyroid normal size, non-tender, without nodularity LYMPH:  no palpable lymphadenopathy in the cervical, axillary or inguinal LUNGS: clear to auscultation and percussion with normal breathing effort HEART: regular rate & rhythm and no murmurs and no lower extremity edema ABDOMEN:abdomen soft, non-tender and normal bowel sounds Musculoskeletal:no cyanosis of digits and no clubbing  PSYCH: alert & oriented x 3 with fluent speech NEURO: no focal motor/sensory deficits  LABORATORY DATA:  I have reviewed the data as listed Lab Results  Component Value Date   WBC 4.2 09/26/2023   HGB 12.6 (L) 09/26/2023   HCT 37.7 (L) 09/26/2023   MCV 97.9 09/26/2023   PLT 108 (L) 09/26/2023   Recent Labs    09/02/23 1041 09/08/23 1403 09/26/23 1258  NA 134* 136 133*  K 4.0 4.2 4.0  CL 99 99 98  CO2 27 26 23   GLUCOSE 123* 92 128*  BUN 11 15 15   CREATININE 1.18 1.15 1.58*  CALCIUM 9.1 9.3  9.4  GFRNONAA >60 >60 43*  PROT 7.0 8.1 9.4*  ALBUMIN 3.3* 3.4* 3.0*  AST 31 42* 43*  ALT 18 17 14   ALKPHOS 71 83 70  BILITOT 0.6 0.6 0.8    RADIOGRAPHIC STUDIES: I have personally reviewed the radiological images as listed and agreed with the findings in the report. MR Lumbar Spine W Wo Contrast Result Date: 09/22/2023 CLINICAL DATA:  Low back pain, hx of multiple myeloma. Rule out  pathological fracture. EXAM: MRI THORACIC AND LUMBAR SPINE WITHOUT AND WITH CONTRAST TECHNIQUE: Multiplanar and multiecho pulse sequences of the thoracic and lumbar spine were obtained without and with intravenous contrast. CONTRAST:  7mL GADAVIST GADOBUTROL 1 MMOL/ML IV SOLN COMPARISON:  MRI of the thoracic and lumbar spine 06/27/2023. FINDINGS: MRI THORACIC SPINE FINDINGS Alignment:  No substantial sagittal subluxation. Vertebrae: Extensive abnormal heterogeneous marrow throughout the visualized lower cervical and thoracic spine, compatible with known multiple myeloma marrow signal abnormality is more confluent than on the prior without as many discrete lesions identified. Previously seen abnormal enhancement is resolved. T8 and T9 superior endplate fractures are unchanged. Similar remote T12 compression fracture status post vertebral augmentation. No definite new/interval acute fracture. Cord:  Normal cord signal. Paraspinal and other soft tissues: Small right greater than left pleural effusions. Disc levels: Similar degenerative change. No significant canal or foraminal stenosis. MRI LUMBAR SPINE FINDINGS Segmentation:  Standard. Alignment: Same alignment is on the prior. No substantial sagittal subluxation. Vertebrae: Extensive abnormal marrow signal throughout the lumbar spine and visualized sacrum. Numerous discrete lesions. Decreased associated enhancement greatest at L2. Similar compression deformities at L1 and L3. No new/interval fracture identified Conus medullaris: Extends to the L1-L2 level and appears normal. Paraspinal and other soft tissues: Right renal cyst. Disc levels: L1-L2: Mild disc bulge.  No significant canal or foraminal stenosis. L2-L3: Broad disc bulge with ligamentum flavum thickening and facet arthropathy. Similar mild to moderate canal stenosis. Patent foramina. L3-L4: Right eccentric disc bulge. Ligamentum flavum thickening. Facet arthropathy. No substantial change in right  subarticular recess stenosis and mild to moderate canal stenosis. Mild bilateral foraminal stenosis. L4-L5: Broad disc bulge with ligamentum flavum thickening. Similar bilateral subarticular recess stenosis with mild central canal stenosis. Similar mild right and moderate left foraminal stenosis. L5-S1: Broad disc bulge. Tapering of the canal without significant stenosis. IMPRESSION: 1. Extensive abnormal marrow edema throughout the spine, compatible with reported multiple myeloma. Signal changes more confluent with decreased enhancement, possibly post treatment related. 2. Unchanged T8, T9, T12, L1, and L3 fractures. No evidence of new/interval acute fracture. 3. Multilevel degenerative change is not substantially changed and is detailed above. 4. Small right greater than left pleural effusions. Electronically Signed   By: Feliberto Harts M.D.   On: 09/22/2023 22:05   MR Thoracic Spine W Wo Contrast Result Date: 09/22/2023 CLINICAL DATA:  Low back pain, hx of multiple myeloma. Rule out pathological fracture. EXAM: MRI THORACIC AND LUMBAR SPINE WITHOUT AND WITH CONTRAST TECHNIQUE: Multiplanar and multiecho pulse sequences of the thoracic and lumbar spine were obtained without and with intravenous contrast. CONTRAST:  7mL GADAVIST GADOBUTROL 1 MMOL/ML IV SOLN COMPARISON:  MRI of the thoracic and lumbar spine 06/27/2023. FINDINGS: MRI THORACIC SPINE FINDINGS Alignment:  No substantial sagittal subluxation. Vertebrae: Extensive abnormal heterogeneous marrow throughout the visualized lower cervical and thoracic spine, compatible with known multiple myeloma marrow signal abnormality is more confluent than on the prior without as many discrete lesions identified. Previously seen abnormal enhancement is resolved. T8 and T9 superior endplate  fractures are unchanged. Similar remote T12 compression fracture status post vertebral augmentation. No definite new/interval acute fracture. Cord:  Normal cord signal. Paraspinal  and other soft tissues: Small right greater than left pleural effusions. Disc levels: Similar degenerative change. No significant canal or foraminal stenosis. MRI LUMBAR SPINE FINDINGS Segmentation:  Standard. Alignment: Same alignment is on the prior. No substantial sagittal subluxation. Vertebrae: Extensive abnormal marrow signal throughout the lumbar spine and visualized sacrum. Numerous discrete lesions. Decreased associated enhancement greatest at L2. Similar compression deformities at L1 and L3. No new/interval fracture identified Conus medullaris: Extends to the L1-L2 level and appears normal. Paraspinal and other soft tissues: Right renal cyst. Disc levels: L1-L2: Mild disc bulge.  No significant canal or foraminal stenosis. L2-L3: Broad disc bulge with ligamentum flavum thickening and facet arthropathy. Similar mild to moderate canal stenosis. Patent foramina. L3-L4: Right eccentric disc bulge. Ligamentum flavum thickening. Facet arthropathy. No substantial change in right subarticular recess stenosis and mild to moderate canal stenosis. Mild bilateral foraminal stenosis. L4-L5: Broad disc bulge with ligamentum flavum thickening. Similar bilateral subarticular recess stenosis with mild central canal stenosis. Similar mild right and moderate left foraminal stenosis. L5-S1: Broad disc bulge. Tapering of the canal without significant stenosis. IMPRESSION: 1. Extensive abnormal marrow edema throughout the spine, compatible with reported multiple myeloma. Signal changes more confluent with decreased enhancement, possibly post treatment related. 2. Unchanged T8, T9, T12, L1, and L3 fractures. No evidence of new/interval acute fracture. 3. Multilevel degenerative change is not substantially changed and is detailed above. 4. Small right greater than left pleural effusions. Electronically Signed   By: Feliberto Harts M.D.   On: 09/22/2023 22:05

## 2023-09-26 NOTE — Progress Notes (Signed)
 Palliative Medicine [facility] at [facility] Telephone:([phone]) [phone] Fax:(336) [phone]   Name: Melvin Gilmore Date: 09/26/2023 MRN: [id number]  DOB: [birth year]  Patient Care Team: [name], MD as PCP - General (Internal Medicine) [name], MD as Consulting Physician (Oncology)    REASON FOR CONSULTATION: [name] is a 83 y.o. male with multiple medical problems including history of prostate cancer status post HIFU, now with multiple myeloma.  MRI thoracic and lumbar spine showed a subacute compression fracture of T12 and focal enhancing marrow replacing lesions in T2, T5, T6, T8, and L1.  Palliative Care consulted to address goals and manage ongoing symptoms.  SOCIAL HISTORY:     reports that he has never smoked. He has never used smokeless tobacco. He reports current alcohol use of about 7.0 standard drinks of alcohol per week. He reports that he does not use drugs.  Patient is married lives home with his wife.  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   Nausea vomiting and diarrhea 01/26/2023   Prostate cancer (HCC) 2023   Seasonal allergies    Skin cancer    a.) ears   Wears partial dentures    lower    PAST SURGICAL HISTORY:  Past Surgical History:  Procedure Laterality Date   APPENDECTOMY     CATARACT EXTRACTION W/PHACO Right 01/15/2016   Procedure: CATARACT EXTRACTION PHACO AND INTRAOCULAR LENS PLACEMENT (IOC);   Surgeon: Sherald Hess, MD;  Location: [facility];  Service: Ophthalmology;  Laterality: Right;  RIGHT CALL CELL PHONE WITH TIME   CHOLECYSTECTOMY     COLONOSCOPY     COLONOSCOPY WITH ESOPHAGOGASTRODUODENOSCOPY (EGD)     ESOPHAGOGASTRODUODENOSCOPY  07/2021   ESOPHAGOGASTRODUODENOSCOPY (EGD) WITH PROPOFOL N/A 09/23/2017   Procedure: ESOPHAGOGASTRODUODENOSCOPY (EGD) WITH PROPOFOL;  Surgeon: Toledo, Boykin Nearing, MD;  Location: [facility];  Service: Gastroenterology;  Laterality: N/A;   HIGH INTENSITY FOCUSED ULTRASOUND (HIFU) OF THE PROSTATE N/A 02/07/2022   Procedure: HIGH INTENSITY FOCUSED ULTRASOUND (HIFU) OF THE PROSTATE;  Surgeon: Orson Ape, MD;  Location: [facility];  Service: Urology;  Laterality: N/A;   HYDROCELE EXCISION / REPAIR     IR KYPHO THORACIC WITH BONE BIOPSY  12/06/2022   IR RADIOLOGIST EVAL & MGMT  11/21/2022   LASIX RT EYE     PROSTATE BIOPSY N/A 01/03/2022   Procedure: PROSTATE BIOPSY  Addison Dotzler;  Surgeon: Orson Ape, MD;  Location: [facility];  Service: Urology;  Laterality: N/A;   ROTATOR CUFF REPAIR Left 2001    HEMATOLOGY/ONCOLOGY HISTORY:  Oncology History  Multiple myeloma (HCC)  12/05/2022 Cancer Staging   Staging form: Plasma Cell Myeloma and Plasma Cell Disorders, AJCC 8th Edition - Clinical stage from 12/05/2022: RISS Stage II (Beta-2-microglobulin (mg/L): 3.7, Albumin (g/dL): 3.6, ISS: Stage II, High-risk cytogenetics: Absent, LDH: Normal) - Signed by [name], MD on  12/26/2022 Stage prefix: Initial diagnosis Beta 2 microglobulin range (mg/L): 3.5 to 5.49 Albumin range (g/dL): Greater than or equal to 3.5 Cytogenetics: 1q addition, Other mutation   12/13/2022 Initial Diagnosis   Multiple myeloma (HCC)   12/26/2022 - 01/24/2023 Chemotherapy   Patient is on Treatment Plan : MYELOMA NEWLY DIAGNOSED Daratumumab IV + Lenalidomide + Dexamethasone Weekly (DaraRd) q28d     02/07/2023 - 02/07/2023 Chemotherapy   Patient is on Treatment Plan  : MYELOMA NON-TRANSPLANT CANDIDATES VRd weekly q21d     02/13/2023 - 06/09/2023 Chemotherapy   Patient is on Treatment Plan : MYELOMA NEWLY DIAGNOSED Daratumumab IV + Lenalidomide + Dexamethasone Weekly (DaraRd) q28d     06/13/2023 - 06/23/2023 Chemotherapy   Patient is on Treatment Plan : MYELOMA  RVD SQ q21d x 4 cycles       ALLERGIES:  is allergic to ciprofloxacin, amoxicillin-pot clavulanate, meloxicam, and trimethoprim.  MEDICATIONS:  Current Outpatient Medications  Medication Sig Dispense Refill   acetaminophen (TYLENOL) 500 MG tablet Take 500 mg by mouth every 6 (six) hours as needed for mild pain, moderate pain, fever or headache.     acyclovir (ZOVIRAX) 400 MG tablet Take 400 mg by mouth 2 (two) times daily.     ALPRAZolam (XANAX) 0.25 MG tablet Take 1 tablet (0.25 mg total) by mouth at bedtime as needed for anxiety. 30 tablet 0   atorvastatin (LIPITOR) 40 MG tablet Take 1 tablet (40 mg total) by mouth daily. (Patient not taking: Reported on 09/16/2023) 30 tablet 2   diphenoxylate-atropine (LOMOTIL) 2.5-0.025 MG tablet Take 1 tablet by mouth 4 (four) times daily as needed for diarrhea or loose stools. (Patient not taking: Reported on 09/16/2023) 30 tablet 0   hydrOXYzine (ATARAX) 25 MG tablet Take 1 tablet by mouth at bedtime. (Patient not taking: Reported on 09/16/2023)     ixazomib citrate (NINLARO) 3 MG capsule Take 1 capsule (3 mg) by mouth weekly, 3 weeks on, 1 week off, repeat every 4 weeks. Take on an empty stomach 1hr before or 2hr after meals. (Patient not taking: Reported on 09/16/2023) 3 capsule 0   lenalidomide (REVLIMID) 5 MG capsule Take 1 capsule (5 mg total) by mouth daily. Take for 21 days, then hold for 7 days. Repeat every 28 days. (Patient not taking: Reported on 09/16/2023) 21 capsule 0   levothyroxine (SYNTHROID, LEVOTHROID) 50 MCG tablet Take 50 mcg by mouth daily before breakfast.     metoCLOPramide (REGLAN) 10 MG tablet Take 1 tablet (10 mg total) by mouth every 8  (eight) hours as needed for up to 10 doses (hiccups). Take one pill 1 hour prior to taking Decadron. 10 tablet 0   morphine (MSIR) 15 MG tablet Take 1 tablet (15 mg total) by mouth every 8 (eight) hours as needed for severe pain (pain score 7-10). 30 tablet 0   pantoprazole (PROTONIX) 40 MG tablet Take 40 mg by mouth daily as needed.     pravastatin (PRAVACHOL) 10 MG tablet Take 1 tablet by mouth at bedtime. (Patient not taking: Reported on 09/16/2023)     Probiotic Product (PROBIOTIC ADVANCED PO) Take 1 tablet by mouth at bedtime.     senna (SENOKOT) 8.6 MG tablet Take 2 tablets (17.2 mg total) by mouth 2 (two) times daily. May crush, mix with water and give sublingually if needed. 28 tablet 0   sulfamethoxazole-trimethoprim (BACTRIM DS) 800-160 MG tablet Take 1 tablet by mouth every 12 (twelve) hours. 60 tablet 0   traZODone (DESYREL) 50 MG  tablet Take by mouth.     trimethoprim (TRIMPEX) 100 MG tablet Take 1 tablet (100 mg total) by mouth daily. 90 tablet 0   zolpidem (AMBIEN) 5 MG tablet TAKE 1 TABLET BY MOUTH AT BEDTIME AS NEEDED FOR SLEEP. 30 tablet 0   No current facility-administered medications for this visit.    VITAL SIGNS: There were no vitals taken for this visit. There were no vitals filed for this visit.  Estimated body mass index is 20.08 kg/m as calculated from the following:   Height as of 07/02/23: 5\' 11"  (1.803 m).   Weight as of 09/08/23: 144 lb (65.3 kg).  LABS: CBC:    Component Value Date/Time   WBC 4.2 09/26/2023 1258   HGB 12.6 (L) 09/26/2023 1258   HGB 12.5 (L) 08/25/2023 1402   HCT 37.7 (L) 09/26/2023 1258   PLT 108 (L) 09/26/2023 1258   PLT 157 08/25/2023 1402   MCV 97.9 09/26/2023 1258   NEUTROABS 2.4 09/26/2023 1258   LYMPHSABS 0.9 09/26/2023 1258   MONOABS 0.8 09/26/2023 1258   EOSABS 0.1 09/26/2023 1258   BASOSABS 0.0 09/26/2023 1258   Comprehensive Metabolic Panel:    Component Value Date/Time   NA 133 (L) 09/26/2023 1258   K 4.0 09/26/2023  1258   CL 98 09/26/2023 1258   CO2 23 09/26/2023 1258   BUN 15 09/26/2023 1258   CREATININE 1.58 (H) 09/26/2023 1258   CREATININE 0.93 08/01/2023 1021   GLUCOSE 128 (H) 09/26/2023 1258   CALCIUM 9.4 09/26/2023 1258   AST 43 (H) 09/26/2023 1258   AST 30 08/01/2023 1021   ALT 14 09/26/2023 1258   ALT 21 08/01/2023 1021   ALKPHOS 70 09/26/2023 1258   BILITOT 0.8 09/26/2023 1258   BILITOT 0.9 08/01/2023 1021   PROT 9.4 (H) 09/26/2023 1258   ALBUMIN 3.0 (L) 09/26/2023 1258    RADIOGRAPHIC STUDIES: MR Lumbar Spine W Wo Contrast Result Date: 09/22/2023 CLINICAL DATA:  Low back pain, hx of multiple myeloma. Rule out pathological fracture. EXAM: MRI THORACIC AND LUMBAR SPINE WITHOUT AND WITH CONTRAST TECHNIQUE: Multiplanar and multiecho pulse sequences of the thoracic and lumbar spine were obtained without and with intravenous contrast. CONTRAST:  7mL GADAVIST GADOBUTROL 1 MMOL/ML IV SOLN COMPARISON:  MRI of the thoracic and lumbar spine 06/27/2023. FINDINGS: MRI THORACIC SPINE FINDINGS Alignment:  No substantial sagittal subluxation. Vertebrae: Extensive abnormal heterogeneous marrow throughout the visualized lower cervical and thoracic spine, compatible with known multiple myeloma marrow signal abnormality is more confluent than on the prior without as many discrete lesions identified. Previously seen abnormal enhancement is resolved. T8 and T9 superior endplate fractures are unchanged. Similar remote T12 compression fracture status post vertebral augmentation. No definite new/interval acute fracture. Cord:  Normal cord signal. Paraspinal and other soft tissues: Small right greater than left pleural effusions. Disc levels: Similar degenerative change. No significant canal or foraminal stenosis. MRI LUMBAR SPINE FINDINGS Segmentation:  Standard. Alignment: Same alignment is on the prior. No substantial sagittal subluxation. Vertebrae: Extensive abnormal marrow signal throughout the lumbar spine and  visualized sacrum. Numerous discrete lesions. Decreased associated enhancement greatest at L2. Similar compression deformities at L1 and L3. No new/interval fracture identified Conus medullaris: Extends to the L1-L2 level and appears normal. Paraspinal and other soft tissues: Right renal cyst. Disc levels: L1-L2: Mild disc bulge.  No significant canal or foraminal stenosis. L2-L3: Broad disc bulge with ligamentum flavum thickening and facet arthropathy. Similar mild to moderate canal stenosis. Patent foramina. L3-L4: Right  eccentric disc bulge. Ligamentum flavum thickening. Facet arthropathy. No substantial change in right subarticular recess stenosis and mild to moderate canal stenosis. Mild bilateral foraminal stenosis. L4-L5: Broad disc bulge with ligamentum flavum thickening. Similar bilateral subarticular recess stenosis with mild central canal stenosis. Similar mild right and moderate left foraminal stenosis. L5-S1: Broad disc bulge. Tapering of the canal without significant stenosis. IMPRESSION: 1. Extensive abnormal marrow edema throughout the spine, compatible with reported multiple myeloma. Signal changes more confluent with decreased enhancement, possibly post treatment related. 2. Unchanged T8, T9, T12, L1, and L3 fractures. No evidence of new/interval acute fracture. 3. Multilevel degenerative change is not substantially changed and is detailed above. 4. Small right greater than left pleural effusions. Electronically Signed   By: Feliberto Harts M.D.   On: 09/22/2023 22:05   MR Thoracic Spine W Wo Contrast Result Date: 09/22/2023 CLINICAL DATA:  Low back pain, hx of multiple myeloma. Rule out pathological fracture. EXAM: MRI THORACIC AND LUMBAR SPINE WITHOUT AND WITH CONTRAST TECHNIQUE: Multiplanar and multiecho pulse sequences of the thoracic and lumbar spine were obtained without and with intravenous contrast. CONTRAST:  7mL GADAVIST GADOBUTROL 1 MMOL/ML IV SOLN COMPARISON:  MRI of the thoracic  and lumbar spine 06/27/2023. FINDINGS: MRI THORACIC SPINE FINDINGS Alignment:  No substantial sagittal subluxation. Vertebrae: Extensive abnormal heterogeneous marrow throughout the visualized lower cervical and thoracic spine, compatible with known multiple myeloma marrow signal abnormality is more confluent than on the prior without as many discrete lesions identified. Previously seen abnormal enhancement is resolved. T8 and T9 superior endplate fractures are unchanged. Similar remote T12 compression fracture status post vertebral augmentation. No definite new/interval acute fracture. Cord:  Normal cord signal. Paraspinal and other soft tissues: Small right greater than left pleural effusions. Disc levels: Similar degenerative change. No significant canal or foraminal stenosis. MRI LUMBAR SPINE FINDINGS Segmentation:  Standard. Alignment: Same alignment is on the prior. No substantial sagittal subluxation. Vertebrae: Extensive abnormal marrow signal throughout the lumbar spine and visualized sacrum. Numerous discrete lesions. Decreased associated enhancement greatest at L2. Similar compression deformities at L1 and L3. No new/interval fracture identified Conus medullaris: Extends to the L1-L2 level and appears normal. Paraspinal and other soft tissues: Right renal cyst. Disc levels: L1-L2: Mild disc bulge.  No significant canal or foraminal stenosis. L2-L3: Broad disc bulge with ligamentum flavum thickening and facet arthropathy. Similar mild to moderate canal stenosis. Patent foramina. L3-L4: Right eccentric disc bulge. Ligamentum flavum thickening. Facet arthropathy. No substantial change in right subarticular recess stenosis and mild to moderate canal stenosis. Mild bilateral foraminal stenosis. L4-L5: Broad disc bulge with ligamentum flavum thickening. Similar bilateral subarticular recess stenosis with mild central canal stenosis. Similar mild right and moderate left foraminal stenosis. L5-S1: Broad disc  bulge. Tapering of the canal without significant stenosis. IMPRESSION: 1. Extensive abnormal marrow edema throughout the spine, compatible with reported multiple myeloma. Signal changes more confluent with decreased enhancement, possibly post treatment related. 2. Unchanged T8, T9, T12, L1, and L3 fractures. No evidence of new/interval acute fracture. 3. Multilevel degenerative change is not substantially changed and is detailed above. 4. Small right greater than left pleural effusions. Electronically Signed   By: Feliberto Harts M.D.   On: 09/22/2023 22:05    PERFORMANCE STATUS (ECOG) : 1 - Symptomatic but completely ambulatory  Review of Systems Unless otherwise noted, a complete review of systems is negative.  Physical Exam General: NAD Cardiovascular: regular rate and rhythm Pulmonary: clear ant fields Abdomen: soft, nontender, + bowel sounds GU:  no suprapubic tenderness Extremities: no edema, no joint deformities Skin: no rashes Neurological: Weakness but otherwise nonfocal  IMPRESSION: Patient was an add-on to my schedule today to address goals in the setting of disease progression.  Patient saw Dr. Alena Bills today with discussion of further treatment versus transition to hospice.  Unfortunately, patient has significantly declined with worsening performance status, poor oral intake,  I met with patient and wife.  Both confirm significant decline and feel like they would prefer to transition to care focused on his comfort at home.  We discussed the role of hospice in achieving their goal.  They request referral to hospice.  DNR confirmed  PLAN: -Referral to hospice -Refill Ambien per patient request -DNR/DNI -Follow-up as needed  Case and plan discussed with Dr. Alena Bills  Patient expressed understanding and was in agreement with this plan. He also understands that He can call the clinic at any time with any questions, concerns, or complaints.     Time Total: 15  minutes  Visit consisted of counseling and education dealing with the complex and emotionally intense issues of symptom management and palliative care in the setting of serious and potentially life-threatening illness.Greater than 50%  of this time was spent counseling and coordinating care related to the above assessment and plan.  Signed by: Laurette Schimke, PhD, NP-C

## 2023-09-29 ENCOUNTER — Other Ambulatory Visit: Payer: Self-pay

## 2023-09-29 ENCOUNTER — Telehealth: Payer: Self-pay

## 2023-09-29 DIAGNOSIS — C9000 Multiple myeloma not having achieved remission: Secondary | ICD-10-CM

## 2023-09-29 LAB — MULTIPLE MYELOMA PANEL, SERUM
Albumin SerPl Elph-Mcnc: 3.4 g/dL (ref 2.9–4.4)
Albumin/Glob SerPl: 0.7 (ref 0.7–1.7)
Alpha 1: 0.4 g/dL (ref 0.0–0.4)
Alpha2 Glob SerPl Elph-Mcnc: 0.8 g/dL (ref 0.4–1.0)
B-Globulin SerPl Elph-Mcnc: 0.9 g/dL (ref 0.7–1.3)
Gamma Glob SerPl Elph-Mcnc: 3.4 g/dL — ABNORMAL HIGH (ref 0.4–1.8)
Globulin, Total: 5.5 g/dL — ABNORMAL HIGH (ref 2.2–3.9)
IgA: 3103 mg/dL — ABNORMAL HIGH (ref 61–437)
IgG (Immunoglobin G), Serum: 389 mg/dL — ABNORMAL LOW (ref 603–1613)
IgM (Immunoglobulin M), Srm: 6 mg/dL — ABNORMAL LOW (ref 15–143)
M Protein SerPl Elph-Mcnc: 2.9 g/dL — ABNORMAL HIGH
Total Protein ELP: 8.9 g/dL — ABNORMAL HIGH (ref 6.0–8.5)

## 2023-09-29 LAB — KAPPA/LAMBDA LIGHT CHAINS
Kappa free light chain: 3499.5 mg/L — ABNORMAL HIGH (ref 3.3–19.4)
Kappa, lambda light chain ratio: 1590.68 — ABNORMAL HIGH (ref 0.26–1.65)
Lambda free light chains: 2.2 mg/L — ABNORMAL LOW (ref 5.7–26.3)

## 2023-09-29 NOTE — Progress Notes (Signed)
 Patient transitioned to hospice, disenrolled.

## 2023-09-29 NOTE — Telephone Encounter (Signed)
 Requesting a refill for Revlimid.

## 2023-09-29 NOTE — Telephone Encounter (Signed)
 Called and spoke with patient's wife [name], just to check on his status, she says that he is declining more. He is still not eating. He isn't sleeping and they have to help hold him to pee. She said that the Hospice nurse is coming out there today. I told her to reach out for anything.

## 2023-10-03 ENCOUNTER — Encounter: Payer: Self-pay | Admitting: Internal Medicine

## 2023-10-03 NOTE — Progress Notes (Signed)
 [facility] CONSULT NOTE  I connected with Melvin Gilmore on 10/03/23 at  3:30 PM EST by my chart video and verified that I am speaking with the correct person using two identifiers.   I discussed the limitations, risks, security and privacy concerns of performing an evaluation and management service by telemedicine and the availability of in-person appointments. I also discussed with the patient that there may be a patient responsible charge related to this service. The patient expressed understanding and agreed to proceed.   Other persons participating in the visit and their role in the encounter: none   Patient's location: home  Provider's location: clinic   Chief Complaint: discuss treatment options   Patient Care Team: [name], MD as PCP - General (Internal Medicine) [name], MD as Consulting Physician (Oncology)  CANCER STAGING   Cancer Staging  Multiple myeloma [facility]) Staging form: Plasma Cell Myeloma and Plasma Cell Disorders, AJCC 8th Edition - Clinical stage from 12/05/2022: RISS Stage II (Beta-2-microglobulin (mg/L): 3.7, Albumin (g/dL): 3.6, ISS: Stage II, High-risk cytogenetics: Absent, LDH: Normal) - Signed by [name], MD on 12/26/2022 Stage prefix: Initial diagnosis Beta 2 microglobulin range (mg/L): 3.5 to 5.49 Albumin range (g/dL): Greater than or equal to 3.5 Cytogenetics: 1q addition, Other mutation   ASSESSMENT & PLAN:  Melvin Gilmore 83 y.o. male with pmh of hypertension, hyperlipidemia, anxiety, GERD, BPH, hypothyroidism, prostate cancer s/p HIFU was seen by primary on November 13, 2022 for acute onset of lower back pain follows with medical oncology for management of multiple myeloma.  # Multiple myeloma IgA, RISS Stage II, high risk -MRI thoracic and lumbar spine with and without contrast was reviewed. Showed unchanged subacute compression fracture at T12.  Focal enhancing marrow replacing lesions in T6, T8, T5, T2 all  suspicious for metastatic disease.  L1 lesion measuring 16 mm.  Multilevel degenerative changes present.  -SPEP/IFE showed biclonal IgA kappa paraprotein with M spike 2.1 g/dL and second spike at 0.4 g/dL.  Kappa 453, lambda 6.8 with a ratio of 66.7.  Iron panel and B12 are normal.  PSA 3.44.  CBC showed mild anemia 13.4, chronic.  On CMP normal creatinine and calcium levels.  Elevated total protein 8.8.  LDH normal.  Beta-2 microglobulin 3.7.  Albumin 3.6.  24-hour urine/UPEP showed 1.1 g protein with no M spike.  -BMBx Hypercellular marrow with 60% plasma cells.  IHC positive for CD138/ mum 1.  Kappa restricted consistent with multiple myeloma. L1 vertebral body biopsy showed numerous plasma cells. Cytogenetics - del (13q) and gain 1.    -PET/CT showed activity in L1 and left eighth rib concerning for myeloma.  -Started on Dara-RD on 12/26/2022.  Completed cycle 1 of daratumumab ->hospitalized for urinary retention, constipation and back pain -> inpatient hospitalist transition him to hospice -> patient revoked hospice on 01/31/2023 since he was feeling better.  Multiple treatment interruptions due to UTIs and hospitalization for stroke.  -Completed cycle 5-day 1 of daratumumab on 06/09/2023.  Progressed with increase in kappa light chain.  Changed to VRD on 06/20/2023.  With C1D1 of Velcade he had rigors.  Premedicated with Tylenol and Benadryl with C1D8.  4 hours postinfusion had severe rigors, hallucinations, weakness.  Patient never wants to go back on Velcade.  -s/p Ixazomib/Revlimid/Dex from 07/04/23 - 09/15/23.  Labs showed disease progression.  Did require dose reduction with Ixazomib with diarrhea.  -Connecting via MyChart video visit to discuss about the treatment decisions.  Patient had a  fall on Friday when he landed on his back.  Has been having pain.  Will obtain MRI thoracic and lumbosacral to rule out any pathological fracture.  Unfortunately, patient continues to decline.  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  Patient and wife have not been able to make any decisions about the treatment.  However considering that he has been declining, may be looking into hospice options.  I will follow-up with them in person on Friday to finalize the plan and to get lab work.    # Recurrent UTIs -Follows with Dr. Lonna Cobb.  On trimethoprim.    # Stroke - Admitted 03/07/2023 with acute onset aphasia and right hemianopia. s/p tPA with complete resolution of symptoms.  MRI brain negative.  Echo unremarkable.  On aspirin and Plavix for 21 days then aspirin alone.  On Lipitor 40 mg daily.  Less likely to be related to Revlimid since he had already received 1 cycle and treatment was on hold as he was recovering from neutropenia.  # Intractable hiccups -Secondary to dexamethasone.  # Vertebral compression fractures -Dental clearance obtained. - IV Zometa 3 mg (started on 06/09/2023).  Every 3 months.   # T12 pathological fracture # Cancer-related pain -s/p kyphoplasty by Dr. Elijio Miles on 12/06/22.   -Completed palliative RT on 01/20/2023.  -MRI spine performed From 06/27/2023 shows progression of multiple myeloma with innumerable lesions throughout the covered skeleton since July 2024.  Compression fractures seen on prior have healed.  No acute fracture. Continue with brace PRN.  Can use hemp cream OTC.  Continues to decline oral opioids.  Continue with Tylenol.  # Constipation -Manageable with senna, MiraLAX, lactulose, Movantik as needed.  # History of prostate cancer - follows with Dr. Evelene Croon, urology for elevated PSA.  MRI prostate showed 1 mm category 5 lesion of anterior transition zone.  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 s/p HIFU of the prostate on 02/07/2022.  PSA level from 10/08/2022 was 3.34.  -PSA from 11/21/2022 3.44.  # Anxiety -He is taking Ambien 5 mg nightly prescribed by PCP.  Understands the risk of increased  falls/confusion.  t really helps him with sleep and anxiety so he would like to continue it.  Orders Placed This Encounter  Procedures   MR Thoracic Spine W Wo Contrast    Standing Status:   Future    Number of Occurrences:   1    Expected Date:   09/23/2023    Expiration Date:   09/21/2024    GRA to provide read?:   Yes    If indicated for the ordered procedure, I authorize the administration of contrast media per Radiology protocol:   Yes    What is the patient's sedation requirement?:   No Sedation    Use SRS Protocol?:   Yes    Does the patient have a pacemaker or implanted devices?:   No    Preferred imaging location?:   [facility] (table limit - 550lbs)   MR Lumbar Spine W Wo Contrast    Standing Status:   Future    Number of Occurrences:   1    Expected Date:   09/23/2023    Expiration Date:   09/21/2024    If indicated for the ordered procedure, I authorize the administration of contrast media per Radiology protocol:   Yes    What is the patient's sedation requirement?:   No Sedation    Does the patient have  a pacemaker or implanted devices?:   No    Use SRS Protocol?:   Yes    Preferred imaging location?:   [facility] (table limit - 550lbs)   CBC with Differential/Platelet    Standing Status:   Future    Number of Occurrences:   1    Expected Date:   09/26/2023    Expiration Date:   09/21/2024   Comprehensive metabolic panel    Standing Status:   Future    Number of Occurrences:   1    Expected Date:   09/26/2023    Expiration Date:   09/21/2024   Multiple Myeloma Panel (SPEP&IFE w/QIG)    Standing Status:   Future    Number of Occurrences:   1    Expected Date:   09/26/2023    Expiration Date:   09/21/2024   Kappa/lambda light chains    Standing Status:   Future    Number of Occurrences:   1    Expected Date:   09/26/2023    Expiration Date:   09/21/2024     The total time spent in the appointment was 30 minutes encounter with patients including review of chart and  various tests results, discussions about plan of care and coordination of care plan   All questions were answered. The patient knows to call the clinic with any problems, questions or concerns. No barriers to learning was detected.  [name], MD 3/14/202511:26 AM   HISTORY OF PRESENTING ILLNESS:  Melvin Gilmore 83 y.o. male with past medical history of hypertension, hyperlipidemia, anxiety, GERD, BPH, hypothyroidism, prostate cancer s/p HIFU was seen by primary on November 13, 2022 for acute onset of lower back pain.  Patient reports that he was sitting outside and had violent sneeze which was followed by severe back pain.  He has fallen allergies.  Had x-rays followed by MRI thoracic spine without contrast.  It showed compression fracture of T12 superior endplate with surrounding marrow edema likely subacute.  15 mm T1 hypointense marrow lesion in the anterior aspect of L1 suspicious for metastasis.  Further characterization with postcontrast MRI recommended.  Prostate cancer-follows with Dr. Evelene Croon, urology for elevated PSA.  MRI prostate showed 1 mm category 5 lesion of anterior transition zone.  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 s/p HIFU of the prostate on 02/07/2022.  PSA level from 10/08/2022 was 3.34.  Interval history Patient was connected via MyChart video visit accompanied with his wife. Patient had a fall last week.  Having back pain.  Diet poor.  Spending more time sleeping.  Functionally declining.  I have reviewed his chart and materials related to his cancer extensively and collaborated history with the patient. Summary of oncologic history is as follows: Oncology History  Multiple myeloma (HCC)  12/05/2022 Cancer Staging   Staging form: Plasma Cell Myeloma and Plasma Cell Disorders, AJCC 8th Edition - Clinical stage from 12/05/2022: RISS Stage II (Beta-2-microglobulin (mg/L): 3.7, Albumin (g/dL): 3.6, ISS: Stage  II, High-risk cytogenetics: Absent, LDH: Normal) - Signed by [name], MD on 12/26/2022 Stage prefix: Initial diagnosis Beta 2 microglobulin range (mg/L): 3.5 to 5.49 Albumin range (g/dL): Greater than or equal to 3.5 Cytogenetics: 1q addition, Other mutation   12/13/2022 Initial Diagnosis   Multiple myeloma (HCC)   12/26/2022 - 01/24/2023 Chemotherapy   Patient is on Treatment Plan : MYELOMA NEWLY DIAGNOSED Daratumumab IV + Lenalidomide + Dexamethasone Weekly (DaraRd) q28d  02/07/2023 - 02/07/2023 Chemotherapy   Patient is on Treatment Plan : MYELOMA NON-TRANSPLANT CANDIDATES VRd weekly q21d     02/13/2023 - 06/09/2023 Chemotherapy   Patient is on Treatment Plan : MYELOMA NEWLY DIAGNOSED Daratumumab IV + Lenalidomide + Dexamethasone Weekly (DaraRd) q28d     06/13/2023 - 06/23/2023 Chemotherapy   Patient is on Treatment Plan : MYELOMA  RVD SQ q21d x 4 cycles       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   Nausea vomiting and diarrhea 01/26/2023   Prostate cancer (HCC) 2023   Seasonal allergies    Skin cancer    a.) ears   Wears partial dentures    lower    SURGICAL HISTORY: Past Surgical History:  Procedure Laterality Date   APPENDECTOMY     CATARACT EXTRACTION W/PHACO Right 01/15/2016   Procedure: CATARACT EXTRACTION PHACO AND INTRAOCULAR LENS PLACEMENT (IOC);  Surgeon: Sherald Hess, MD;  Location: [facility];  Service: Ophthalmology;  Laterality: Right;  RIGHT CALL CELL PHONE WITH TIME    CHOLECYSTECTOMY     COLONOSCOPY     COLONOSCOPY WITH ESOPHAGOGASTRODUODENOSCOPY (EGD)     ESOPHAGOGASTRODUODENOSCOPY  07/2021   ESOPHAGOGASTRODUODENOSCOPY (EGD) WITH PROPOFOL N/A 09/23/2017   Procedure: ESOPHAGOGASTRODUODENOSCOPY (EGD) WITH PROPOFOL;  Surgeon: Toledo, Boykin Nearing, MD;  Location: [facility];  Service: Gastroenterology;  Laterality: N/A;   HIGH INTENSITY FOCUSED ULTRASOUND (HIFU) OF THE PROSTATE N/A 02/07/2022   Procedure: HIGH INTENSITY FOCUSED ULTRASOUND (HIFU) OF THE PROSTATE;  Surgeon: Orson Ape, MD;  Location: [facility];  Service: Urology;  Laterality: N/A;   HYDROCELE EXCISION / REPAIR     IR KYPHO THORACIC WITH BONE BIOPSY  12/06/2022   IR RADIOLOGIST EVAL & MGMT  11/21/2022   LASIX RT EYE     PROSTATE BIOPSY N/A 01/03/2022   Procedure: PROSTATE BIOPSY  Addison Tuckey;  Surgeon: Orson Ape, MD;  Location: [facility];  Service: Urology;  Laterality: N/A;   ROTATOR CUFF REPAIR Left 2001    SOCIAL HISTORY: Social History   Socioeconomic History   Marital status: Married    Spouse name: Not on file   Number of children: Not on file   Years of education: Not on file   Highest education level: Not on file  Occupational History   Not on file  Tobacco Use   Smoking status: Never   Smokeless tobacco: Never  Vaping Use   Vaping status: Never Used  Substance and Sexual Activity   Alcohol use: Yes    Alcohol/week: 7.0 standard drinks of alcohol    Types: 7 Glasses of wine per week    Comment: wine occ   Drug use: No   Sexual activity: Not on file  Other Topics Concern   Not on file  Social History Narrative   Not on file   Social Drivers of Health   Financial Resource Strain: Low Risk  (06/11/2023)   Received from  [facility]   Overall Financial Resource Strain (CARDIA)    Difficulty of Paying Living Expenses: Not hard at all  Food Insecurity: No Food Insecurity (06/11/2023)   Received from [facility]   Hunger Vital  Sign    Worried About Running Out of Food in the Last Year: Never true    Ran Out of Food in the Last Year: Never true  Transportation Needs: No Transportation Needs (06/11/2023)   Received from [facility] - Transportation    In the past 12 months, has lack of transportation kept you from medical appointments or from getting medications?: No    Lack of Transportation (Non-Medical): No  Physical Activity: Not on file  Stress: Not on file  Social Connections: Not on file  Intimate Partner Violence: Patient Unable To Answer (03/07/2023)   Humiliation, Afraid, Rape, and Kick questionnaire    Fear of Current or Ex-Partner: Patient unable to answer    Emotionally Abused: Patient unable to answer    Physically Abused: Patient unable to answer    Sexually Abused: Patient unable to answer    FAMILY HISTORY: Family History  Problem Relation Age of Onset   Pneumonia Father     ALLERGIES:  is allergic to ciprofloxacin, amoxicillin-pot clavulanate, meloxicam, and trimethoprim.  MEDICATIONS:  Current Outpatient Medications  Medication Sig Dispense Refill   acetaminophen (TYLENOL) 500 MG tablet Take 500 mg by mouth every 6 (six) hours as needed for mild pain, moderate pain, fever or headache.     acyclovir (ZOVIRAX) 400 MG tablet Take 400 mg by mouth 2 (two) times daily.     ALPRAZolam (XANAX) 0.25 MG tablet Take 1 tablet (0.25 mg total) by mouth at bedtime as needed for anxiety. 30 tablet 0   atorvastatin (LIPITOR) 40 MG tablet Take 1 tablet (40 mg total) by mouth daily. (Patient not taking: Reported on 09/16/2023) 30 tablet 2   diphenoxylate-atropine (LOMOTIL) 2.5-0.025 MG tablet Take 1 tablet by mouth 4 (four) times daily as needed for diarrhea or loose stools. (Patient not taking: Reported on 09/16/2023) 30 tablet 0   hydrOXYzine (ATARAX) 25 MG tablet Take 1 tablet by mouth at bedtime. (Patient not taking: Reported on 09/16/2023)     ixazomib citrate (NINLARO) 3 MG  capsule Take 1 capsule (3 mg) by mouth weekly, 3 weeks on, 1 week off, repeat every 4 weeks. Take on an empty stomach 1hr before or 2hr after meals. (Patient not taking: Reported on 09/16/2023) 3 capsule 0   lenalidomide (REVLIMID) 5 MG capsule Take 1 capsule (5 mg total) by mouth daily. Take for 21 days, then hold for 7 days. Repeat every 28 days. (Patient not taking: Reported on 09/16/2023) 21 capsule 0   levothyroxine (SYNTHROID, LEVOTHROID) 50 MCG tablet Take 50 mcg by mouth daily before breakfast.     metoCLOPramide (REGLAN) 10 MG tablet Take 1 tablet (10 mg total) by mouth every 8 (eight) hours as needed for up to 10 doses (hiccups). Take one pill 1 hour prior to taking Decadron. 10 tablet 0   morphine (MSIR) 15 MG tablet Take 1 tablet (15 mg total) by mouth every 8 (eight) hours as needed for severe pain (pain score 7-10). 30 tablet 0   pantoprazole (PROTONIX) 40 MG tablet Take 40 mg by mouth daily as needed.     pravastatin (PRAVACHOL) 10 MG tablet Take 1 tablet by mouth at bedtime. (Patient not taking: Reported on 09/16/2023)  Probiotic Product (PROBIOTIC ADVANCED PO) Take 1 tablet by mouth at bedtime.     senna (SENOKOT) 8.6 MG tablet Take 2 tablets (17.2 mg total) by mouth 2 (two) times daily. May crush, mix with water and give sublingually if needed. 28 tablet 0   sulfamethoxazole-trimethoprim (BACTRIM DS) 800-160 MG tablet Take 1 tablet by mouth every 12 (twelve) hours. 60 tablet 0   traZODone (DESYREL) 50 MG tablet Take by mouth.     trimethoprim (TRIMPEX) 100 MG tablet Take 1 tablet (100 mg total) by mouth daily. 90 tablet 0   zolpidem (AMBIEN) 5 MG tablet Take 0.5-1 tablets (2.5-5 mg total) by mouth at bedtime as needed. for sleep 30 tablet 0   No current facility-administered medications for this visit.    REVIEW OF SYSTEMS:   Pertinent information mentioned in HPI All other systems were reviewed with the patient and are negative.  PHYSICAL EXAMINATION: ECOG PERFORMANCE STATUS:  1 - Symptomatic but completely ambulatory  There were no vitals filed for this visit.     There were no vitals filed for this visit.      GENERAL:alert, no distress and comfortable SKIN: skin color, texture, turgor are normal, no rashes or significant lesions EYES: normal, conjunctiva are pink and non-injected, sclera clear OROPHARYNX:no exudate, no erythema and lips, buccal mucosa, and tongue normal  NECK: supple, thyroid normal size, non-tender, without nodularity LYMPH:  no palpable lymphadenopathy in the cervical, axillary or inguinal LUNGS: clear to auscultation and percussion with normal breathing effort HEART: regular rate & rhythm and no murmurs and no lower extremity edema ABDOMEN:abdomen soft, non-tender and normal bowel sounds Musculoskeletal:no cyanosis of digits and no clubbing  PSYCH: alert & oriented x 3 with fluent speech NEURO: no focal motor/sensory deficits  LABORATORY DATA:  I have reviewed the data as listed Lab Results  Component Value Date   WBC 4.2 09/26/2023   HGB 12.6 (L) 09/26/2023   HCT 37.7 (L) 09/26/2023   MCV 97.9 09/26/2023   PLT 108 (L) 09/26/2023   Recent Labs    09/02/23 1041 09/08/23 1403 09/26/23 1258  NA 134* 136 133*  K 4.0 4.2 4.0  CL 99 99 98  CO2 27 26 23   GLUCOSE 123* 92 128*  BUN 11 15 15   CREATININE 1.18 1.15 1.58*  CALCIUM 9.1 9.3 9.4  GFRNONAA >60 >60 43*  PROT 7.0 8.1 9.4*  ALBUMIN 3.3* 3.4* 3.0*  AST 31 42* 43*  ALT 18 17 14   ALKPHOS 71 83 70  BILITOT 0.6 0.6 0.8    RADIOGRAPHIC STUDIES: I have personally reviewed the radiological images as listed and agreed with the findings in the report. MR Lumbar Spine W Wo Contrast Result Date: 09/22/2023 CLINICAL DATA:  Low back pain, hx of multiple myeloma. Rule out pathological fracture. EXAM: MRI THORACIC AND LUMBAR SPINE WITHOUT AND WITH CONTRAST TECHNIQUE: Multiplanar and multiecho pulse sequences of the thoracic and lumbar spine were obtained without and with  intravenous contrast. CONTRAST:  7mL GADAVIST GADOBUTROL 1 MMOL/ML IV SOLN COMPARISON:  MRI of the thoracic and lumbar spine 06/27/2023. FINDINGS: MRI THORACIC SPINE FINDINGS Alignment:  No substantial sagittal subluxation. Vertebrae: Extensive abnormal heterogeneous marrow throughout the visualized lower cervical and thoracic spine, compatible with known multiple myeloma marrow signal abnormality is more confluent than on the prior without as many discrete lesions identified. Previously seen abnormal enhancement is resolved. T8 and T9 superior endplate fractures are unchanged. Similar remote T12 compression fracture status post vertebral augmentation. No definite new/interval  acute fracture. Cord:  Normal cord signal. Paraspinal and other soft tissues: Small right greater than left pleural effusions. Disc levels: Similar degenerative change. No significant canal or foraminal stenosis. MRI LUMBAR SPINE FINDINGS Segmentation:  Standard. Alignment: Same alignment is on the prior. No substantial sagittal subluxation. Vertebrae: Extensive abnormal marrow signal throughout the lumbar spine and visualized sacrum. Numerous discrete lesions. Decreased associated enhancement greatest at L2. Similar compression deformities at L1 and L3. No new/interval fracture identified Conus medullaris: Extends to the L1-L2 level and appears normal. Paraspinal and other soft tissues: Right renal cyst. Disc levels: L1-L2: Mild disc bulge.  No significant canal or foraminal stenosis. L2-L3: Broad disc bulge with ligamentum flavum thickening and facet arthropathy. Similar mild to moderate canal stenosis. Patent foramina. L3-L4: Right eccentric disc bulge. Ligamentum flavum thickening. Facet arthropathy. No substantial change in right subarticular recess stenosis and mild to moderate canal stenosis. Mild bilateral foraminal stenosis. L4-L5: Broad disc bulge with ligamentum flavum thickening. Similar bilateral subarticular recess stenosis with  mild central canal stenosis. Similar mild right and moderate left foraminal stenosis. L5-S1: Broad disc bulge. Tapering of the canal without significant stenosis. IMPRESSION: 1. Extensive abnormal marrow edema throughout the spine, compatible with reported multiple myeloma. Signal changes more confluent with decreased enhancement, possibly post treatment related. 2. Unchanged T8, T9, T12, L1, and L3 fractures. No evidence of new/interval acute fracture. 3. Multilevel degenerative change is not substantially changed and is detailed above. 4. Small right greater than left pleural effusions. Electronically Signed   By: Feliberto Harts M.D.   On: 09/22/2023 22:05   MR Thoracic Spine W Wo Contrast Result Date: 09/22/2023 CLINICAL DATA:  Low back pain, hx of multiple myeloma. Rule out pathological fracture. EXAM: MRI THORACIC AND LUMBAR SPINE WITHOUT AND WITH CONTRAST TECHNIQUE: Multiplanar and multiecho pulse sequences of the thoracic and lumbar spine were obtained without and with intravenous contrast. CONTRAST:  7mL GADAVIST GADOBUTROL 1 MMOL/ML IV SOLN COMPARISON:  MRI of the thoracic and lumbar spine 06/27/2023. FINDINGS: MRI THORACIC SPINE FINDINGS Alignment:  No substantial sagittal subluxation. Vertebrae: Extensive abnormal heterogeneous marrow throughout the visualized lower cervical and thoracic spine, compatible with known multiple myeloma marrow signal abnormality is more confluent than on the prior without as many discrete lesions identified. Previously seen abnormal enhancement is resolved. T8 and T9 superior endplate fractures are unchanged. Similar remote T12 compression fracture status post vertebral augmentation. No definite new/interval acute fracture. Cord:  Normal cord signal. Paraspinal and other soft tissues: Small right greater than left pleural effusions. Disc levels: Similar degenerative change. No significant canal or foraminal stenosis. MRI LUMBAR SPINE FINDINGS Segmentation:  Standard.  Alignment: Same alignment is on the prior. No substantial sagittal subluxation. Vertebrae: Extensive abnormal marrow signal throughout the lumbar spine and visualized sacrum. Numerous discrete lesions. Decreased associated enhancement greatest at L2. Similar compression deformities at L1 and L3. No new/interval fracture identified Conus medullaris: Extends to the L1-L2 level and appears normal. Paraspinal and other soft tissues: Right renal cyst. Disc levels: L1-L2: Mild disc bulge.  No significant canal or foraminal stenosis. L2-L3: Broad disc bulge with ligamentum flavum thickening and facet arthropathy. Similar mild to moderate canal stenosis. Patent foramina. L3-L4: Right eccentric disc bulge. Ligamentum flavum thickening. Facet arthropathy. No substantial change in right subarticular recess stenosis and mild to moderate canal stenosis. Mild bilateral foraminal stenosis. L4-L5: Broad disc bulge with ligamentum flavum thickening. Similar bilateral subarticular recess stenosis with mild central canal stenosis. Similar mild right and moderate left foraminal stenosis. L5-S1:  Broad disc bulge. Tapering of the canal without significant stenosis. IMPRESSION: 1. Extensive abnormal marrow edema throughout the spine, compatible with reported multiple myeloma. Signal changes more confluent with decreased enhancement, possibly post treatment related. 2. Unchanged T8, T9, T12, L1, and L3 fractures. No evidence of new/interval acute fracture. 3. Multilevel degenerative change is not substantially changed and is detailed above. 4. Small right greater than left pleural effusions. Electronically Signed   By: Feliberto Harts M.D.   On: 09/22/2023 22:05

## 2023-10-06 ENCOUNTER — Other Ambulatory Visit

## 2023-10-06 ENCOUNTER — Other Ambulatory Visit: Payer: Medicare Other

## 2023-10-06 ENCOUNTER — Ambulatory Visit: Payer: Medicare Other | Admitting: Internal Medicine

## 2023-10-06 ENCOUNTER — Ambulatory Visit: Admitting: Hospice and Palliative Medicine

## 2023-10-06 ENCOUNTER — Ambulatory Visit: Admitting: Internal Medicine

## 2023-10-21 DEATH — deceased

## 2024-05-21 NOTE — Telephone Encounter (Signed)
 Encounter opened in error.
